# Patient Record
Sex: Female | Born: 1960 | Race: Black or African American | Hispanic: No | Smoking: Current every day smoker
Health system: Southern US, Community
[De-identification: ages and names within clinical notes are randomized; demographics above are authoritative.]

## PROBLEM LIST (undated history)

## (undated) DIAGNOSIS — F329 Major depressive disorder, single episode, unspecified: Secondary | ICD-10-CM

## (undated) DIAGNOSIS — K7010 Alcoholic hepatitis without ascites: Secondary | ICD-10-CM

## (undated) DIAGNOSIS — G8929 Other chronic pain: Secondary | ICD-10-CM

## (undated) DIAGNOSIS — C090 Malignant neoplasm of tonsillar fossa: Secondary | ICD-10-CM

## (undated) DIAGNOSIS — D649 Anemia, unspecified: Secondary | ICD-10-CM

## (undated) DIAGNOSIS — R519 Headache, unspecified: Secondary | ICD-10-CM

## (undated) DIAGNOSIS — J209 Acute bronchitis, unspecified: Secondary | ICD-10-CM

## (undated) DIAGNOSIS — K219 Gastro-esophageal reflux disease without esophagitis: Secondary | ICD-10-CM

## (undated) DIAGNOSIS — J439 Emphysema, unspecified: Secondary | ICD-10-CM

## (undated) DIAGNOSIS — I10 Essential (primary) hypertension: Secondary | ICD-10-CM

## (undated) DIAGNOSIS — J449 Chronic obstructive pulmonary disease, unspecified: Secondary | ICD-10-CM

## (undated) DIAGNOSIS — J189 Pneumonia, unspecified organism: Secondary | ICD-10-CM

## (undated) DIAGNOSIS — J42 Unspecified chronic bronchitis: Secondary | ICD-10-CM

## (undated) DIAGNOSIS — F209 Schizophrenia, unspecified: Secondary | ICD-10-CM

## (undated) DIAGNOSIS — Z720 Tobacco use: Secondary | ICD-10-CM

## (undated) DIAGNOSIS — I499 Cardiac arrhythmia, unspecified: Secondary | ICD-10-CM

## (undated) DIAGNOSIS — F1010 Alcohol abuse, uncomplicated: Secondary | ICD-10-CM

## (undated) DIAGNOSIS — F419 Anxiety disorder, unspecified: Secondary | ICD-10-CM

## (undated) DIAGNOSIS — K5900 Constipation, unspecified: Secondary | ICD-10-CM

## (undated) DIAGNOSIS — F32A Depression, unspecified: Secondary | ICD-10-CM

## (undated) DIAGNOSIS — R51 Headache: Secondary | ICD-10-CM

## (undated) DIAGNOSIS — Z8541 Personal history of malignant neoplasm of cervix uteri: Secondary | ICD-10-CM

## (undated) DIAGNOSIS — M419 Scoliosis, unspecified: Secondary | ICD-10-CM

## (undated) DIAGNOSIS — C801 Malignant (primary) neoplasm, unspecified: Secondary | ICD-10-CM

## (undated) DIAGNOSIS — R0989 Other specified symptoms and signs involving the circulatory and respiratory systems: Secondary | ICD-10-CM

## (undated) DIAGNOSIS — F1410 Cocaine abuse, uncomplicated: Secondary | ICD-10-CM

## (undated) DIAGNOSIS — M549 Dorsalgia, unspecified: Secondary | ICD-10-CM

## (undated) DIAGNOSIS — J45909 Unspecified asthma, uncomplicated: Secondary | ICD-10-CM

## (undated) DIAGNOSIS — R440 Auditory hallucinations: Secondary | ICD-10-CM

## (undated) HISTORY — DX: Malignant neoplasm of tonsillar fossa: C09.0

## (undated) HISTORY — PX: TONSILLECTOMY: SUR1361

## (undated) HISTORY — PX: ABDOMINAL HYSTERECTOMY: SHX81

## (undated) HISTORY — PX: FRACTURE SURGERY: SHX138

## (undated) HISTORY — DX: Personal history of malignant neoplasm of cervix uteri: Z85.41

## (undated) HISTORY — DX: Malignant (primary) neoplasm, unspecified: C80.1

---

## 2000-09-03 ENCOUNTER — Ambulatory Visit (HOSPITAL_COMMUNITY): Admission: RE | Admit: 2000-09-03 | Discharge: 2000-09-03 | Payer: Self-pay | Admitting: Family Medicine

## 2000-09-03 ENCOUNTER — Encounter: Payer: Self-pay | Admitting: Family Medicine

## 2000-09-22 ENCOUNTER — Emergency Department (HOSPITAL_COMMUNITY): Admission: EM | Admit: 2000-09-22 | Discharge: 2000-09-22 | Payer: Self-pay | Admitting: *Deleted

## 2000-11-23 ENCOUNTER — Emergency Department (HOSPITAL_COMMUNITY): Admission: EM | Admit: 2000-11-23 | Discharge: 2000-11-23 | Payer: Self-pay | Admitting: *Deleted

## 2001-01-23 ENCOUNTER — Emergency Department (HOSPITAL_COMMUNITY): Admission: EM | Admit: 2001-01-23 | Discharge: 2001-01-24 | Payer: Self-pay | Admitting: Emergency Medicine

## 2001-01-24 ENCOUNTER — Encounter: Payer: Self-pay | Admitting: Emergency Medicine

## 2001-01-26 ENCOUNTER — Emergency Department (HOSPITAL_COMMUNITY): Admission: EM | Admit: 2001-01-26 | Discharge: 2001-01-26 | Payer: Self-pay | Admitting: Emergency Medicine

## 2001-06-09 ENCOUNTER — Encounter: Payer: Self-pay | Admitting: *Deleted

## 2001-06-09 ENCOUNTER — Emergency Department (HOSPITAL_COMMUNITY): Admission: EM | Admit: 2001-06-09 | Discharge: 2001-06-09 | Payer: Self-pay | Admitting: *Deleted

## 2001-06-16 ENCOUNTER — Emergency Department (HOSPITAL_COMMUNITY): Admission: EM | Admit: 2001-06-16 | Discharge: 2001-06-16 | Payer: Self-pay | Admitting: *Deleted

## 2002-05-07 HISTORY — PX: COLONOSCOPY: SHX174

## 2002-06-02 HISTORY — PX: ESOPHAGOGASTRODUODENOSCOPY: SHX1529

## 2003-05-09 HISTORY — PX: BREAST BIOPSY: SHX20

## 2005-04-30 ENCOUNTER — Emergency Department (HOSPITAL_COMMUNITY): Admission: EM | Admit: 2005-04-30 | Discharge: 2005-04-30 | Payer: Self-pay | Admitting: *Deleted

## 2005-05-21 ENCOUNTER — Emergency Department (HOSPITAL_COMMUNITY): Admission: EM | Admit: 2005-05-21 | Discharge: 2005-05-21 | Payer: Self-pay | Admitting: Emergency Medicine

## 2005-09-04 ENCOUNTER — Ambulatory Visit (HOSPITAL_COMMUNITY): Admission: RE | Admit: 2005-09-04 | Discharge: 2005-09-04 | Payer: Self-pay | Admitting: Family Medicine

## 2005-10-18 ENCOUNTER — Ambulatory Visit (HOSPITAL_COMMUNITY): Admission: RE | Admit: 2005-10-18 | Discharge: 2005-10-18 | Payer: Self-pay | Admitting: Family Medicine

## 2006-04-25 ENCOUNTER — Ambulatory Visit (HOSPITAL_COMMUNITY): Admission: RE | Admit: 2006-04-25 | Discharge: 2006-04-25 | Payer: Self-pay | Admitting: Family Medicine

## 2006-10-18 ENCOUNTER — Emergency Department (HOSPITAL_COMMUNITY): Admission: EM | Admit: 2006-10-18 | Discharge: 2006-10-18 | Payer: Self-pay | Admitting: Emergency Medicine

## 2006-10-31 ENCOUNTER — Ambulatory Visit (HOSPITAL_COMMUNITY): Admission: RE | Admit: 2006-10-31 | Discharge: 2006-10-31 | Payer: Self-pay | Admitting: Internal Medicine

## 2006-11-14 ENCOUNTER — Encounter (INDEPENDENT_AMBULATORY_CARE_PROVIDER_SITE_OTHER): Payer: Self-pay | Admitting: General Surgery

## 2006-11-14 ENCOUNTER — Ambulatory Visit (HOSPITAL_COMMUNITY): Admission: RE | Admit: 2006-11-14 | Discharge: 2006-11-14 | Payer: Self-pay | Admitting: General Surgery

## 2007-11-23 ENCOUNTER — Inpatient Hospital Stay (HOSPITAL_COMMUNITY): Admission: EM | Admit: 2007-11-23 | Discharge: 2007-11-27 | Payer: Self-pay | Admitting: Emergency Medicine

## 2008-01-09 ENCOUNTER — Emergency Department (HOSPITAL_COMMUNITY): Admission: EM | Admit: 2008-01-09 | Discharge: 2008-01-09 | Payer: Self-pay | Admitting: Emergency Medicine

## 2008-02-23 ENCOUNTER — Emergency Department (HOSPITAL_COMMUNITY): Admission: EM | Admit: 2008-02-23 | Discharge: 2008-02-23 | Payer: Self-pay | Admitting: Emergency Medicine

## 2008-03-09 ENCOUNTER — Emergency Department (HOSPITAL_COMMUNITY): Admission: EM | Admit: 2008-03-09 | Discharge: 2008-03-10 | Payer: Self-pay | Admitting: Emergency Medicine

## 2008-03-19 ENCOUNTER — Emergency Department (HOSPITAL_COMMUNITY): Admission: EM | Admit: 2008-03-19 | Discharge: 2008-03-19 | Payer: Self-pay | Admitting: Emergency Medicine

## 2008-11-24 ENCOUNTER — Emergency Department (HOSPITAL_COMMUNITY): Admission: EM | Admit: 2008-11-24 | Discharge: 2008-11-24 | Payer: Self-pay | Admitting: Emergency Medicine

## 2010-04-13 ENCOUNTER — Ambulatory Visit (HOSPITAL_COMMUNITY): Admission: RE | Admit: 2010-04-13 | Payer: Self-pay | Admitting: Internal Medicine

## 2010-05-08 DIAGNOSIS — I499 Cardiac arrhythmia, unspecified: Secondary | ICD-10-CM

## 2010-05-08 HISTORY — DX: Cardiac arrhythmia, unspecified: I49.9

## 2010-05-29 ENCOUNTER — Encounter: Payer: Self-pay | Admitting: Family Medicine

## 2010-05-29 ENCOUNTER — Encounter: Payer: Self-pay | Admitting: General Surgery

## 2010-05-29 ENCOUNTER — Encounter: Payer: Self-pay | Admitting: Internal Medicine

## 2010-08-05 ENCOUNTER — Emergency Department (HOSPITAL_COMMUNITY): Payer: Medicaid Other

## 2010-08-05 ENCOUNTER — Emergency Department (HOSPITAL_COMMUNITY)
Admission: EM | Admit: 2010-08-05 | Discharge: 2010-08-05 | Disposition: A | Discharge status: Home / Self Care | Payer: Medicaid Other | Attending: Emergency Medicine | Admitting: Emergency Medicine

## 2010-08-05 DIAGNOSIS — S8990XA Unspecified injury of unspecified lower leg, initial encounter: Secondary | ICD-10-CM | POA: Insufficient documentation

## 2010-08-05 DIAGNOSIS — S298XXA Other specified injuries of thorax, initial encounter: Secondary | ICD-10-CM | POA: Insufficient documentation

## 2010-08-05 DIAGNOSIS — IMO0002 Reserved for concepts with insufficient information to code with codable children: Secondary | ICD-10-CM | POA: Insufficient documentation

## 2010-08-05 LAB — URINALYSIS, ROUTINE W REFLEX MICROSCOPIC
Bilirubin Urine: NEGATIVE
Ketones, ur: NEGATIVE mg/dL
Nitrite: NEGATIVE
Protein, ur: NEGATIVE mg/dL
Specific Gravity, Urine: <1.005 — ABNORMAL LOW (ref 1.005–1.030)
Urobilinogen, UA: 0.2 mg/dL (ref 0.0–1.0)

## 2010-08-05 LAB — CBC
HCT: 35.7 % — ABNORMAL LOW (ref 36.0–46.0)
MCH: 34 pg (ref 26.0–34.0)
MCV: 99.4 fL (ref 78.0–100.0)
Platelets: 549 10*3/uL — ABNORMAL HIGH (ref 150–400)
RDW: 13.8 % (ref 11.5–15.5)

## 2010-08-05 LAB — BASIC METABOLIC PANEL
BUN: 16 mg/dL (ref 6–23)
CO2: 24 mEq/L (ref 19–32)
Chloride: 108 mEq/L (ref 96–112)
Glucose, Bld: 97 mg/dL (ref 70–99)
Potassium: 4.1 mEq/L (ref 3.5–5.1)

## 2010-08-05 LAB — DIFFERENTIAL
Eosinophils Absolute: 0.1 10*3/uL (ref 0.0–0.7)
Eosinophils Relative: 1 % (ref 0–5)
Lymphs Abs: 2.9 10*3/uL (ref 0.7–4.0)
Monocytes Relative: 9 % (ref 3–12)

## 2010-08-05 LAB — POCT PREGNANCY, URINE: Preg Test, Ur: NEGATIVE

## 2010-08-29 ENCOUNTER — Other Ambulatory Visit (HOSPITAL_COMMUNITY): Payer: Self-pay | Admitting: Internal Medicine

## 2010-08-29 DIAGNOSIS — Z139 Encounter for screening, unspecified: Secondary | ICD-10-CM

## 2010-08-30 ENCOUNTER — Ambulatory Visit (HOSPITAL_COMMUNITY)
Admission: RE | Admit: 2010-08-30 | Discharge: 2010-08-30 | Disposition: A | Discharge status: Home / Self Care | Payer: Medicaid Other | Source: Ambulatory Visit | Attending: Internal Medicine | Admitting: Internal Medicine

## 2010-08-30 DIAGNOSIS — Z139 Encounter for screening, unspecified: Secondary | ICD-10-CM

## 2010-08-30 DIAGNOSIS — Z1231 Encounter for screening mammogram for malignant neoplasm of breast: Secondary | ICD-10-CM | POA: Insufficient documentation

## 2010-09-08 ENCOUNTER — Encounter (HOSPITAL_COMMUNITY): Payer: Self-pay | Admitting: Radiology

## 2010-09-08 ENCOUNTER — Emergency Department (HOSPITAL_COMMUNITY): Payer: Medicaid Other

## 2010-09-08 ENCOUNTER — Emergency Department (HOSPITAL_COMMUNITY)
Admission: EM | Admit: 2010-09-08 | Discharge: 2010-09-08 | Disposition: A | Discharge status: Home / Self Care | Payer: Medicaid Other | Attending: Emergency Medicine | Admitting: Emergency Medicine

## 2010-09-08 DIAGNOSIS — F79 Unspecified intellectual disabilities: Secondary | ICD-10-CM | POA: Insufficient documentation

## 2010-09-08 DIAGNOSIS — Z8659 Personal history of other mental and behavioral disorders: Secondary | ICD-10-CM | POA: Insufficient documentation

## 2010-09-08 DIAGNOSIS — Z8541 Personal history of malignant neoplasm of cervix uteri: Secondary | ICD-10-CM | POA: Insufficient documentation

## 2010-09-08 DIAGNOSIS — F1410 Cocaine abuse, uncomplicated: Secondary | ICD-10-CM | POA: Insufficient documentation

## 2010-09-08 DIAGNOSIS — I10 Essential (primary) hypertension: Secondary | ICD-10-CM | POA: Insufficient documentation

## 2010-09-08 DIAGNOSIS — R071 Chest pain on breathing: Secondary | ICD-10-CM | POA: Insufficient documentation

## 2010-09-08 DIAGNOSIS — J438 Other emphysema: Secondary | ICD-10-CM | POA: Insufficient documentation

## 2010-09-08 DIAGNOSIS — Z7982 Long term (current) use of aspirin: Secondary | ICD-10-CM | POA: Insufficient documentation

## 2010-09-08 DIAGNOSIS — F1010 Alcohol abuse, uncomplicated: Secondary | ICD-10-CM | POA: Insufficient documentation

## 2010-09-08 DIAGNOSIS — Z79899 Other long term (current) drug therapy: Secondary | ICD-10-CM | POA: Insufficient documentation

## 2010-09-08 HISTORY — DX: Essential (primary) hypertension: I10

## 2010-09-08 LAB — DIFFERENTIAL
Eosinophils Relative: 1 % (ref 0–5)
Lymphocytes Relative: 58 % — ABNORMAL HIGH (ref 12–46)
Lymphs Abs: 4.5 10*3/uL — ABNORMAL HIGH (ref 0.7–4.0)
Monocytes Absolute: 0.4 10*3/uL (ref 0.1–1.0)
Neutro Abs: 2.7 10*3/uL (ref 1.7–7.7)

## 2010-09-08 LAB — POCT CARDIAC MARKERS
Myoglobin, poc: 22 ng/mL (ref 12–200)
Troponin i, poc: <0.05 ng/mL (ref 0.00–0.09)
Troponin i, poc: <0.05 ng/mL (ref 0.00–0.09)

## 2010-09-08 LAB — COMPREHENSIVE METABOLIC PANEL
ALT: 22 U/L (ref 0–35)
Alkaline Phosphatase: 97 U/L (ref 39–117)
BUN: 13 mg/dL (ref 6–23)
CO2: 24 mEq/L (ref 19–32)
GFR calc non Af Amer: >60 mL/min (ref >60)
Glucose, Bld: 82 mg/dL (ref 70–99)
Potassium: 3.6 mEq/L (ref 3.5–5.1)
Sodium: 139 mEq/L (ref 135–145)
Total Bilirubin: 0.2 mg/dL — ABNORMAL LOW (ref 0.3–1.2)
Total Protein: 8 g/dL (ref 6.0–8.3)

## 2010-09-08 LAB — ETHANOL: Alcohol, Ethyl (B): 283 mg/dL — ABNORMAL HIGH (ref 0–10)

## 2010-09-08 LAB — CBC
HCT: 36.6 % (ref 36.0–46.0)
Hemoglobin: 12.8 g/dL (ref 12.0–15.0)
MCV: 98.4 fL (ref 78.0–100.0)
RDW: 13 % (ref 11.5–15.5)
WBC: 7.8 10*3/uL (ref 4.0–10.5)

## 2010-09-08 LAB — PROTIME-INR
INR: 0.94 (ref 0.00–1.49)
Prothrombin Time: 12.8 seconds (ref 11.6–15.2)

## 2010-09-08 LAB — RAPID URINE DRUG SCREEN, HOSP PERFORMED
Cocaine: POSITIVE — AB
Tetrahydrocannabinol: NOT DETECTED

## 2010-09-08 LAB — APTT: aPTT: 34 seconds (ref 24–37)

## 2010-09-20 NOTE — Discharge Summary (Signed)
NAMEMCKINLEIGH, SCHUCHART NO.:  [id number]   MEDICAL RECORD NO.:  [id number]          PATIENT TYPE:  INP   LOCATION:  A323                          FACILITY:  [facility]   PHYSICIAN:  Dorris Singh, DO    DATE OF BIRTH:  [birth year]   DATE OF ADMISSION:  11/23/2007  DATE OF DISCHARGE:  07/22/2009LH                               DISCHARGE SUMMARY   ADMISSION DIAGNOSES:  1. Acute alcohol intoxication.  2. Bilateral pneumonia by aspiration.  3. Abdominal pain, unclear etiology.  4. History of hypertension.   DISCHARGE DIAGNOSES:  1. Bilateral aspiration pneumonia which is improving clinically.  2. History of hypertension.  3. History of alcohol abuse.   CONSULTS:  There were not any.   TESTING THAT WAS DONE:  Include on November 23, 2007, she had a pneumonia  chest x-ray done which demonstrated left lower lobe airspace disease.  On the 18th she had a 1-veiw chest which showed bibasilar atelectasis  pneumonia.  On the 18th also she had a CT of the head without a contrast  which demonstrated no acute intracranial findings by noncontrast CT.  On  the 18th also she had a 2-view abdomen which showed nonobstructive bowel  gas pattern.   PRIMARY CARE PHYSICIAN:  Dr. Olena Leatherwood.   Her HP was done by Dr. Rito Ehrlich.  To summaries, patient is a 50 year old  [place] female who was found lying on the ground outside of her  apartment building.  EMS was called and she was brought in.  She was  found to be intoxicated with alcohol and also hypothermic.  She was also  found to have bilateral pneumonia.  She was treated with Rocephin and  Zithromax and also placed on Ativan protocol and thiamine and counseling  was provided.  She was found not to have pancreatitis and continued to  improve well without any problems.  There was still some issue with her  hypertension.  She is on hydrochlorothiazide and bisoprolol at home.  She was started on Benicar while she was here today as well.   She was  placed on DVT and GI prophylaxis.  She continued to improve.  She is  pulse oxing well.  It was determined that she could be discharged to  home today.   PHYSICAL EXAMINATION:  Patient is generally is well-developed, well-  nourished, no acute distress, alert and oriented x3.  HEART:  Regular rate and rhythm.  LUNGS:  Clear to auscultation.  No wheezing, rales or rhonchi.  ABDOMEN:  Soft, nontender, nondistended.  EXTREMITIES:  Positive pulses.   Her labs reviewed today are within normal limits.   MEDICATIONS:  Medications that she will be sent home on include:  1. A Z-Pak use as directed 1 dispensed.  2. Levaquin 500 mg 1 p.o. every day times 5 days.  3. Also was sent home on Benicar 20 mg p.o. daily #30.  4. Ativan 1 mg 1 p.o. q.12 hours p.r.n. #4.   DISCHARGE INSTRUCTIONS:  She will go home on her current medications  which includes:  1. Evista 60 mg 1 p.o. every day.  2. Bisoprolol/hydrochlorothiazide 30 mg 1 tab p.o. every day.  3. ProAir 2 puffs p.r.n.  4. Hydrocodone APA no dose given 1 tablet every 12 hours.  5. Omeprazole 20 mg 1 p.o. daily.  6. Zyprexa 20 mg 1 p.o. every day.  We will actually stop her hydrochlorothiazide and continue with  her__________mg p.o. every day and Oystercal 500 mg p.o. daily.  Placed  her on a Z-Pak use as directed, Levaquin 500 mg 1 p.o. daily x5 days and  Ativan 1 p.o. q.12 hours p.r.n. x4.  She is recommended to follow up  with Dr. Olena Leatherwood in 1-3 days and to stop drinking alcohol.  Explained  extensively with patient the risk versus benefits of her actions.  She  states that she would like to stop smoking but of course not at this  time and to stop drinking alcohol.  She understand the potential risk  that it contributes to her health.   CONDITION:  Today is stable.   DISCHARGE:  to home.      Dorris Singh, DO  Electronically Signed     CB/MEDQ  D:  11/27/2007  T:  11/27/2007  Job:  [id number]   cc:   Lia Hopping   Fax: [phone]

## 2010-09-20 NOTE — Op Note (Signed)
[name], Molly Campbell NO.:  [id number]   MEDICAL RECORD NO.:  [id number]          PATIENT TYPE:  AMB   LOCATION:  DAY                           FACILITY:  [facility]   PHYSICIAN:  Dalia Heading, M.D.  DATE OF BIRTH:  08/07/1960   DATE OF PROCEDURE:  11/14/2006  DATE OF DISCHARGE:                               OPERATIVE REPORT   PREOPERATIVE DIAGNOSIS:  Left breast neoplasm, unspecified.   POSTOPERATIVE DIAGNOSIS:  Left breast neoplasm, unspecified.   PROCEDURE:  Left breast biopsy after needle localization.   SURGEON:  Dalia Heading, MD   ANESTHESIA:  General.   INDICATIONS:  The patient is a 50 year old black female who presents  with a very posterior left breast neoplasm which could not  stereotactically biopsied.  The patient now comes to the operating room  for left breast biopsy after needle localization.  The risks and  benefits of the procedure including bleeding and infection were fully  explained to the patient, who gave informed consent.   PROCEDURE NOTE:  The patient is placed in supine position after  undergoing needle localization in the radiology department.  The left  breast was prepped and draped using the usual sterile technique after  Betadine once general anesthesia was administered.  Surgical site  confirmation was performed.   The needle localization was in the upper, outer quadrant, along the  inferior aspect of the axilla.  An incision was made in the upper, outer  quadrant of the left breast down to the area of localization.  This was  removed without difficulty.  Of note was the fact that this was  extending to the pectoralis major muscle and into the axilla to some  degree.  The specimen was then sent for specimen radiography.  The  microcalcifications were within the specimen removed.  The specimen was  then set sent to pathology for further examination.  Any bleeding was  controlled using Bovie electrocautery.  The subcutaneous  layer was  reapproximated using a 4-0 Vicryl interrupted suture.  The skin was  closed using a 4-0 Vicryl subcuticular suture.  Sensorcaine 0.5% was  instilled into the surrounding wound.  Dermabond was then applied.   All tape and needle counts were correct at the end of the procedure.  The patient was awakened and transferred to PACU in stable condition.   COMPLICATIONS:  None.   SPECIMEN:  Left breast tissue.   BLOOD LOSS:  Minimal.      Dalia Heading, M.D.  Electronically Signed     MAJ/MEDQ  D:  11/14/2006  T:  11/15/2006  Job:  [id number]   cc:   Lia Hopping  Fax: [phone]

## 2010-09-20 NOTE — Group Therapy Note (Signed)
NAMELORE, POLKA NO.:  [id number]   MEDICAL RECORD NO.:  [id number]          PATIENT TYPE:  INP   LOCATION:  A323                          FACILITY:  [facility]   PHYSICIAN:  Osvaldo Shipper, MD     DATE OF BIRTH:  01-09-1961   DATE OF PROCEDURE:  11/26/2007  DATE OF DISCHARGE:                                 PROGRESS NOTE   SUBJECTIVE:  Briefly, this is a 50 year old African American female who  presented to the hospital with unresponsiveness.  She was found to be  acutely alcohol intoxicated.  She was also a little bit short of breath  and was found to have pneumonia.   Subjectively, the patient is feeling better today.  She was having some  shakes this morning, otherwise she does not have any nausea, vomiting or  diarrhea.  She is tolerating p.o. intake.   OBJECTIVE:  VITAL SIGNS:  Temperature 97.4, blood pressure 148/100,  heart rate 92, respiratory rate 20, saturation 95% on room air.  LUNGS:  Clear to auscultation bilaterally today.  No rales, rhonchi or  wheezing.  CARDIOVASCULAR:  S1 and S2 are normal and regular.  No murmurs  appreciated.  ABDOMEN:  Soft.  EXTREMITIES:  Do not show any edema.   LABORATORY DATA:  Stable findings.   ASSESSMENT AND PLANS:  1. Aspiration pneumonia.  Continue intravenous antibiotics for now.      This is day #4.  2. Alcoholism with withdrawal symptoms.  She is on Ativan protocol and      thiamine.  She is also on Clonidine for three days to help with her      blood pressure.  She seems to be doing better.  She is still a      little bit tremulous.  I am  hoping that she will improve in the      next day or two and she will be ready for discharge soon.  3. She has a history of hypertension.  She was on hydrochlorothiazide      at home.  She was actually on a combination drug which included the      hydrochlorothiazide but she does not know what the other part was.      I am going to go ahead and start this patient on  some Benicar      today.  The Clonidine will automatically stop after tomorrow's last      dose.  If needed, it can be continued by further orders.  4. She is on deep venous thrombosis prophylaxis  Her electrolytes are      all okay.  She is tolerating p.o. intake.  I anticipate discharge      in the next day or two.      Osvaldo Shipper, MD  Electronically Signed     GK/MEDQ  D:  11/26/2007  T:  11/26/2007  Job:  [id number]

## 2010-09-20 NOTE — H&P (Signed)
Molly Campbell, Molly Campbell NO.:  [id number]   MEDICAL RECORD NO.:  [id number]          PATIENT TYPE:  INP   LOCATION:  A323                          FACILITY:  [facility]   PHYSICIAN:  Osvaldo Shipper, MD     DATE OF BIRTH:  [birth year]   DATE OF ADMISSION:  11/23/2007  DATE OF DISCHARGE:  LH                              HISTORY & PHYSICAL   PRIMARY CARE PHYSICIAN:  Lia Hopping, M.D.   ADMISSION DIAGNOSES:  1. Acute alcohol intoxication.  2. Bilateral pneumonia by aspiration.  3. Abnormal pain, unclear etiology.  4. History of hypertension.   CHIEF COMPLAINT:  Abdominal pain.   HISTORY OF PRESENT ILLNESS:  This is a 50 year old African American  female who was found lying on the ground in a parking lot outside of an  apartment building.  EMS was called and the patient was brought in to  the ED.  She was found to be intoxicated with alcohol.  The patient was  also hypothermic.  The patient slowly started to wake up and then she  mentioned that she had been binge drinking.  The patient at this time  was complaining of a cough with brownish expectoration.  Denies any  chest pain.  Does have some shortness of breath.  Also complaining of  nausea and vomiting as well as diarrhea for the past two or three days.  She also has mentioned a wave of abnormal discomfort towards her  abdomen.  No radiation of the pain is present.  No precipitant  aggravating or relieving factors identified.  Pain is about 4 out of 10  in intensity.  The patient currently has slurred speech, is still  slightly intoxicated, and no further history is available.   MEDICATIONS:  1. She is on an antihypertensive agent.  She says HCTZ in combination      with another drug.  2. She is on a sleeping pill.  3. She says she is on a medication called Evista, dose is unknown.  4. She is also on  what she calls a nerve pill, possibly Zyprexa,      though she is not very sure.   ALLERGIES:  No known drug  allergies.   PAST MEDICAL HISTORY:  1. Hypertension.  2. Alcoholism.   PAST SURGICAL HISTORY:  1. Hysterectomy.  2. Some sort of breast surgery.  Apparently she had a left breast      biopsy done last year.  This was positive for a cystic lesion and      no definite malignancy was identified.  3. She has had an EGD and a colonoscopy for unclear reasons in the      past according to the patient.  No report is available in our      system here.  In fact, according to her, she was found to have      polyps.   SOCIAL HISTORY:  Lives alone in an apartment.  Smokes a pack and a half  of cigarettes on a daily basis.  Alcohol consumption on a daily basis  with beer.  Denies any  liquor use.  She tells me that her birthday is  next month and she plans to quit drinking alcohol at that time.  Admits  to cocaine use.  No marijuana use.  No IV drug use.  She is independent  with her daily activities.   FAMILY HISTORY:  Positive for Alzheimer's dementia, hypertension,  diabetes.   REVIEW OF SYSTEMS:  GENERAL:  Positive for weakness.  HEENT:  Positive  for headaches.  CARDIOVASCULAR:  Unremarkable.  RESPIRATORY:  As in HPI.  GI:  As in HPI.  GU:  She said that she had a sexual encounter about a  week ago and then she has had two small episodes of vaginal bleeding but  she does not have any currently.  MUSCULOSKELETAL:  Unremarkable.  NEUROLOGIC:  Unremarkable. PSYCHIATRIC:  Unremarkable.  Other systems  unremarkable.   PHYSICAL EXAMINATION:  VITAL SIGNS:  In the ED she was found to be  hypothermic. Temperature was 95.2 rectally, blood pressure 90/67,  improving to 130/67 with IV fluids.  Heart rate in the 60'sponge and  regular.  Respiratory rate 16.  Saturation 96% on room air.  GENERAL:  This is an [place] American female in no distress, still  intoxicated.  HEENT:  There is no pallor noted. Pupils equal, reacting.  ENT exam was  normal.  NECK:  Soft and supple.  No thyromegaly is  appreciated.  LUNGS:  A few crackles bilateral bases. No wheezing is appreciated.  CARDIOVASCULAR:  S1 and S2  normal, regular.  No murmurs are appreciated  at this time.  No S3 or S4. No rubs, no bruits.  ABDOMEN:  Soft.  Generalized vague discomfort.  No real tenderness is  identified.  No hepatomegaly or splenomegaly.  No masses identified.  Bowel sounds present.  EXTREMITIES:  No edema.  Pulses are palpable.  NEUROLOGIC:  Cranial nerves II-XII intact.  Sensory exam intact.  Motor  strength is intact. She is alert, oriented x3, though she does appear to  be intoxicated with slurred speech.   LABORATORY DATA:  She had an ABG done pH 7.34, pCO2 38, pO2 78,  saturation 93%. Her CBC showed normal white count, hemoglobin 11.2, MCV  99.  Platelet count 362,000, 58% lymphocytes identified.  Lymphocytes on  peripheral smear is noted.  Chloride 115.  Other parameters are normal  on the CMET.  Cardiac markers negative.  Acetaminophen level less than  10.  Cocaine positive on urine drug screen.  Alcohol level 275.  Urinalysis was negative for infection.  She had a CT scan of the head.  She did not show any acute intracranial abnormalities.  Chest x-ray  shows evidence for bilateral atelectasis versus pneumonia.  She also had an EKG which showed sinus rhythm with a normal axis;  intervals appear to be in the normal range.  P wave was quite prominent,  nonspecific T wave changes are present.  No acute ST changes are  present.   ASSESSMENT:  This is a 50 year old [place] American female with history  of hypertension who presents after being found to be unresponsive  outside her apartment.  She is found to be acutely intoxicated with  alcohol.  Also took cocaine and has a pneumonia, likely aspiration.   PLAN:  1. Bilateral pneumonia.  Will treat with ceftriaxone and Zithromax for      now.  Will monitor her closely.  2. Alcohol abuse.  We will put her on Ativan protocol,  given her a  banana bag, start thiamine.  Counseling has been provided.      Magnesium level will be checked as well.  3. Abdominal pain.  Etiology is unclear.  Will check lipase level to      make sure there is no pancreatitis, considering her alcohol abuse      history.  Will also check abdominal plain film.  4. History of vaginal bleeding.  Encouraged her to speak to her      gynecologist regarding this matter.  This is not an active issue at      this time.   We will try to obtain her medication list from [facility] so  that we have the up-to-date dosages.   DVT and GI prophylaxis has been issued.   A Bear Hugger for hypothermia will be utilized until her temperature  comes up.   Estimated length of stay in the hospital is 3 days.      Osvaldo Shipper, MD  Electronically Signed     GK/MEDQ  D:  11/23/2007  T:  11/23/2007  Job:  [id number]   cc:   Lia Hopping  Fax: [phone]

## 2010-09-20 NOTE — H&P (Signed)
NAMETANEAL, SONNTAG NO.:  [id number]   MEDICAL RECORD NO.:  [id number]          PATIENT TYPE:  AMB   LOCATION:  DAY                           FACILITY:  [facility]   PHYSICIAN:  Dalia Heading, M.D.  DATE OF BIRTH:  [birth year]   DATE OF ADMISSION:  11/14/2006  DATE OF DISCHARGE:  LH                              HISTORY & PHYSICAL   CHIEF COMPLAINT:  Left breast neoplasm.   HISTORY OF PRESENT ILLNESS:  The patient is a 50 year old black female  who was referred for evaluation and treatment of an abnormal mammogram  of the left breast.  This was found on routine mammography.  It is too  posterior for a stereotactic biopsy.  No family history of breast  carcinoma or nipple discharge is noted.  No lump is noted by the  patient.   PAST MEDICAL HISTORY:  1. COPD.  2. Hypertension.   PAST SURGICAL HISTORY:  Hysterectomy.   CURRENT MEDICATIONS:  Hydrochlorothiazide, albuterol, Zyprexa.   ALLERGIES:  No known drug allergies.   REVIEW OF SYSTEMS:  The patient smokes a pack and a half of cigarettes a  day.  She does drink alcohol daily.  She denies any other  cardiopulmonary difficulties or bleeding disorders.   PHYSICAL EXAMINATION:  The patient is a well-developed, well-nourished  black female in no acute distress.  NECK:  Supple without lymphadenopathy.  LUNGS:  Examination reveals bilateral coarse rhonchi but no wheezing  noted.  HEART:  A regular rate and rhythm without S3, S4 or murmurs.  Right breast examination reveals no dominant mass, nipple discharge, or  dimpling.  The axilla is negative for palpable nodes.  The left breast  examination reveals no abdominal mass, nipple discharge, or dimpling.  The axilla is negative for palpable nodes.   A mammogram of the left breast reveals coarse calcifications in the  left, upper, outer quadrant.   IMPRESSION:  Left breast neoplasm.   PLAN:  The patient is scheduled for left breast biopsy after needle  localization on November 14, 2006.  The risks and benefits of the procedure,  including bleeding and infection, were fully explained to the patient,  who gave informed consent.      Dalia Heading, M.D.  Electronically Signed     MAJ/MEDQ  D:  11/06/2006  T:  11/07/2006  Job:  [id number]   cc:   [facility]  Fax: [phone]   Lia Hopping  Fax: [phone]

## 2010-11-21 ENCOUNTER — Emergency Department (HOSPITAL_COMMUNITY)
Admission: EM | Admit: 2010-11-21 | Discharge: 2010-11-22 | Disposition: A | Discharge status: Home / Self Care | Payer: Medicaid Other | Attending: Emergency Medicine | Admitting: Emergency Medicine

## 2010-11-21 ENCOUNTER — Encounter (HOSPITAL_COMMUNITY): Payer: Self-pay | Admitting: Emergency Medicine

## 2010-11-21 DIAGNOSIS — R0602 Shortness of breath: Secondary | ICD-10-CM | POA: Insufficient documentation

## 2010-11-21 DIAGNOSIS — IMO0001 Reserved for inherently not codable concepts without codable children: Secondary | ICD-10-CM

## 2010-11-21 DIAGNOSIS — F1010 Alcohol abuse, uncomplicated: Secondary | ICD-10-CM

## 2010-11-21 DIAGNOSIS — F17200 Nicotine dependence, unspecified, uncomplicated: Secondary | ICD-10-CM | POA: Insufficient documentation

## 2010-11-21 DIAGNOSIS — I10 Essential (primary) hypertension: Secondary | ICD-10-CM | POA: Insufficient documentation

## 2010-11-21 DIAGNOSIS — F1410 Cocaine abuse, uncomplicated: Secondary | ICD-10-CM

## 2010-11-21 DIAGNOSIS — R51 Headache: Secondary | ICD-10-CM | POA: Insufficient documentation

## 2010-11-21 NOTE — ED Notes (Signed)
Patient awaiting MD assessment.

## 2010-11-21 NOTE — ED Notes (Signed)
Patient presents to ER via EMS with c/o shortness of breath; patient states she has drank a 40oz today.  Patient states she fell today and "past out" and was out for hours.  Patient A&O; skin w/d. Respirations even and unlabored; able to speak in complete sentences without difficulty.

## 2010-11-22 ENCOUNTER — Encounter (HOSPITAL_COMMUNITY): Payer: Self-pay | Admitting: *Deleted

## 2010-11-22 ENCOUNTER — Emergency Department (HOSPITAL_COMMUNITY): Payer: Medicaid Other

## 2010-11-22 LAB — CBC
MCH: 34 pg (ref 26.0–34.0)
Platelets: 540 10*3/uL — ABNORMAL HIGH (ref 150–400)
RBC: 3.97 MIL/uL (ref 3.87–5.11)
WBC: 5.7 10*3/uL (ref 4.0–10.5)

## 2010-11-22 LAB — BASIC METABOLIC PANEL
CO2: 23 mEq/L (ref 19–32)
Calcium: 8.6 mg/dL (ref 8.4–10.5)
Chloride: 107 mEq/L (ref 96–112)
Sodium: 142 mEq/L (ref 135–145)

## 2010-11-22 LAB — ETHANOL: Alcohol, Ethyl (B): 219 mg/dL — ABNORMAL HIGH (ref 0–11)

## 2010-11-22 NOTE — ED Notes (Signed)
Alert, talking, says she  Has low back pain.  Asking for something to drink.

## 2010-11-22 NOTE — ED Notes (Signed)
Says she hurts all over.

## 2010-11-22 NOTE — ED Provider Notes (Signed)
History     Chief Complaint  Patient presents with  . Headache  . Shortness of Breath  Cocaine use Patient is a 50 y.o. female presenting with shortness of breath. The history is provided by the patient (She admits to smoking crack recently.).  Shortness of Breath  The current episode started today. The onset was gradual. The problem has been unchanged. The problem is moderate. The symptoms are relieved by nothing. The symptoms are aggravated by nothing. Associated symptoms include shortness of breath. Pertinent negatives include no chest pain, no chest pressure, no orthopnea, no fever, no sore throat, no stridor and no wheezing. Her past medical history does not include asthma or bronchiolitis. Urine output has been normal. The last void occurred less than 6 hours ago. There were no sick contacts. She has received no recent medical care.    Past Medical History  Diagnosis Date  . Hypertension     History reviewed. No pertinent past surgical history.  History reviewed. No pertinent family history.  History  Substance Use Topics  . Smoking status: Current Everyday Smoker -- 2.0 packs/day    Types: Cigarettes  . Smokeless tobacco: Not on file  . Alcohol Use: Yes    OB History    Grav Para Term Preterm Abortions TAB SAB Ect Mult Living                  Review of Systems  Constitutional: Negative for fever.  HENT: Negative for sore throat.   Respiratory: Positive for shortness of breath. Negative for wheezing and stridor.   Cardiovascular: Negative for chest pain and orthopnea.  Neurological: Positive for headaches.  All other systems reviewed and are negative.    Physical Exam  BP 135/89  Pulse 61  Temp(Src) 98.8 F (37.1 C) (Oral)  Ht 5\' 2"  (1.575 m)  Wt 100 lb (45.36 kg)  BMI 18.29 kg/m2  SpO2 96%  Physical Exam  Constitutional: She is oriented to person, place, and time. She appears well-developed. No distress.       Strong smell of alcohol  HENT:  Head:  Normocephalic and atraumatic.  Right Ear: External ear normal.  Left Ear: External ear normal.  Eyes: Pupils are equal, round, and reactive to light.  Neck: Normal range of motion.  Cardiovascular: Normal rate, regular rhythm and normal heart sounds.   Pulmonary/Chest: Effort normal and breath sounds normal.  Abdominal: Soft.  Musculoskeletal: Normal range of motion.  Neurological: She is alert and oriented to person, place, and time. No cranial nerve deficit. Coordination normal.  Skin: Skin is warm and dry. She is not diaphoretic.  Psychiatric: Her behavior is normal. Thought content normal.    ED Course  Procedures  MDM I doubt any other EMC precluding discharge at this time including, but not necessarily limited to the following:PNE, ACS, SBI, metabolic instability.      Flint Melter, MD 11/22/10 [phone]

## 2010-11-22 NOTE — ED Notes (Signed)
Asking for something to eat.  Given sandwich and diet cola.  Alert, talking .

## 2011-02-03 LAB — DIFFERENTIAL
Basophils Absolute: 0
Basophils Absolute: 0.1
Basophils Relative: 1
Eosinophils Absolute: 0.1
Eosinophils Relative: 2
Eosinophils Relative: 4
Lymphocytes Relative: 37
Monocytes Absolute: 0.5
Monocytes Absolute: 0.5

## 2011-02-03 LAB — BLOOD GAS, ARTERIAL
Bicarbonate: 20.4
O2 Saturation: 93.8
Patient temperature: 37
TCO2: 19
pO2, Arterial: 78.3 — ABNORMAL LOW

## 2011-02-03 LAB — CULTURE, BLOOD (ROUTINE X 2)
Culture: NO GROWTH
Culture: NO GROWTH
Report Status: 7232009

## 2011-02-03 LAB — RAPID URINE DRUG SCREEN, HOSP PERFORMED
Amphetamines: NOT DETECTED
Barbiturates: NOT DETECTED
Opiates: NOT DETECTED

## 2011-02-03 LAB — COMPREHENSIVE METABOLIC PANEL
ALT: 23
AST: 26
Albumin: 3.7
Alkaline Phosphatase: 66
Chloride: 115 — ABNORMAL HIGH
GFR calc Af Amer: >60
Potassium: 3.6
Sodium: 143
Total Bilirubin: 0.5

## 2011-02-03 LAB — BASIC METABOLIC PANEL
BUN: 11
BUN: 5 — ABNORMAL LOW
BUN: 6
BUN: 7
CO2: 22
Chloride: 107
Chloride: 113 — ABNORMAL HIGH
Chloride: 116 — ABNORMAL HIGH
Creatinine, Ser: 0.8
Creatinine, Ser: 0.83
GFR calc non Af Amer: >60
GFR calc non Af Amer: >60
Glucose, Bld: 100 — ABNORMAL HIGH
Glucose, Bld: 103 — ABNORMAL HIGH
Glucose, Bld: 91
Glucose, Bld: 97
Potassium: 3.6
Potassium: 3.8
Potassium: 3.8
Potassium: 4.2

## 2011-02-03 LAB — URINALYSIS, ROUTINE W REFLEX MICROSCOPIC
Bilirubin Urine: NEGATIVE
Hgb urine dipstick: NEGATIVE
Specific Gravity, Urine: <1.005 — ABNORMAL LOW
Urobilinogen, UA: 0.2

## 2011-02-03 LAB — CBC
HCT: 30.5 — ABNORMAL LOW
HCT: 33.3 — ABNORMAL LOW
Hemoglobin: 10.4 — ABNORMAL LOW
Hemoglobin: 11.2 — ABNORMAL LOW
MCV: 98.9
Platelets: 358
RDW: 13.4
RDW: 13.6
WBC: 7

## 2011-02-03 LAB — CK TOTAL AND CKMB (NOT AT ARMC): Relative Index: 1.1

## 2011-02-03 LAB — ETHANOL: Alcohol, Ethyl (B): 275 — ABNORMAL HIGH

## 2011-02-07 LAB — BASIC METABOLIC PANEL
BUN: 5 — ABNORMAL LOW
CO2: 24
Chloride: 108
Creatinine, Ser: 0.97
Glucose, Bld: 106 — ABNORMAL HIGH

## 2011-02-07 LAB — URINALYSIS, ROUTINE W REFLEX MICROSCOPIC
Hgb urine dipstick: NEGATIVE
Protein, ur: NEGATIVE
Urobilinogen, UA: 0.2

## 2011-02-07 LAB — CBC
MCHC: 34.8
MCV: 96.6
Platelets: 673 — ABNORMAL HIGH
RDW: 13.7

## 2011-02-07 LAB — DIFFERENTIAL
Basophils Relative: 1
Eosinophils Absolute: 0.1
Monocytes Relative: 5
Neutrophils Relative %: 49

## 2011-02-08 LAB — BASIC METABOLIC PANEL
BUN: 16
CO2: 17 — ABNORMAL LOW
Chloride: 105
Creatinine, Ser: 1.43 — ABNORMAL HIGH

## 2011-02-08 LAB — RAPID URINE DRUG SCREEN, HOSP PERFORMED
Amphetamines: NOT DETECTED
Barbiturates: NOT DETECTED
Cocaine: POSITIVE — AB
Opiates: NOT DETECTED
Tetrahydrocannabinol: NOT DETECTED

## 2011-02-08 LAB — CBC
MCHC: 34.9
MCV: 98
Platelets: 490 — ABNORMAL HIGH
RDW: 12.7
WBC: 9.8

## 2011-02-08 LAB — ETHANOL: Alcohol, Ethyl (B): 261 — ABNORMAL HIGH

## 2011-02-08 LAB — URINALYSIS, ROUTINE W REFLEX MICROSCOPIC
Bilirubin Urine: NEGATIVE
Protein, ur: NEGATIVE
Urobilinogen, UA: 0.2

## 2011-02-08 LAB — PROTIME-INR
INR: 1.1
Prothrombin Time: 14

## 2011-02-08 LAB — DIFFERENTIAL
Basophils Absolute: 0
Basophils Relative: 0
Eosinophils Absolute: 0
Neutrophils Relative %: 44

## 2011-02-08 LAB — GLUCOSE, CAPILLARY: Glucose-Capillary: 181 — ABNORMAL HIGH

## 2011-02-21 LAB — BASIC METABOLIC PANEL
BUN: 7
Chloride: 108
Glucose, Bld: 81
Potassium: 4.3
Sodium: 144

## 2011-02-21 LAB — CBC
HCT: 39.7
Hemoglobin: 13.8
MCV: 95.7
Platelets: 501 — ABNORMAL HIGH
WBC: 6.3

## 2011-03-14 ENCOUNTER — Other Ambulatory Visit: Payer: Self-pay

## 2011-03-14 ENCOUNTER — Inpatient Hospital Stay (HOSPITAL_COMMUNITY)
Admission: EM | Admit: 2011-03-14 | Discharge: 2011-03-16 | DRG: 392 | Disposition: A | Discharge status: Home / Self Care | Payer: Medicaid Other | Attending: Internal Medicine | Admitting: Internal Medicine

## 2011-03-14 ENCOUNTER — Emergency Department (HOSPITAL_COMMUNITY): Payer: Medicaid Other

## 2011-03-14 ENCOUNTER — Encounter (HOSPITAL_COMMUNITY): Payer: Self-pay | Admitting: Emergency Medicine

## 2011-03-14 DIAGNOSIS — R0789 Other chest pain: Secondary | ICD-10-CM | POA: Diagnosis present

## 2011-03-14 DIAGNOSIS — F17200 Nicotine dependence, unspecified, uncomplicated: Secondary | ICD-10-CM | POA: Diagnosis present

## 2011-03-14 DIAGNOSIS — F411 Generalized anxiety disorder: Secondary | ICD-10-CM | POA: Diagnosis present

## 2011-03-14 DIAGNOSIS — F10929 Alcohol use, unspecified with intoxication, unspecified: Secondary | ICD-10-CM

## 2011-03-14 DIAGNOSIS — K208 Other esophagitis without bleeding: Principal | ICD-10-CM | POA: Diagnosis present

## 2011-03-14 DIAGNOSIS — K7010 Alcoholic hepatitis without ascites: Secondary | ICD-10-CM

## 2011-03-14 DIAGNOSIS — F1320 Sedative, hypnotic or anxiolytic dependence, uncomplicated: Secondary | ICD-10-CM | POA: Diagnosis present

## 2011-03-14 DIAGNOSIS — K219 Gastro-esophageal reflux disease without esophagitis: Secondary | ICD-10-CM | POA: Diagnosis present

## 2011-03-14 DIAGNOSIS — Z720 Tobacco use: Secondary | ICD-10-CM

## 2011-03-14 DIAGNOSIS — R079 Chest pain, unspecified: Secondary | ICD-10-CM | POA: Diagnosis present

## 2011-03-14 DIAGNOSIS — F1010 Alcohol abuse, uncomplicated: Secondary | ICD-10-CM

## 2011-03-14 DIAGNOSIS — I10 Essential (primary) hypertension: Secondary | ICD-10-CM

## 2011-03-14 DIAGNOSIS — F1410 Cocaine abuse, uncomplicated: Secondary | ICD-10-CM

## 2011-03-14 DIAGNOSIS — K2920 Alcoholic gastritis without bleeding: Secondary | ICD-10-CM | POA: Diagnosis present

## 2011-03-14 HISTORY — DX: Anxiety disorder, unspecified: F41.9

## 2011-03-14 HISTORY — DX: Cocaine abuse, uncomplicated: F14.10

## 2011-03-14 HISTORY — DX: Auditory hallucinations: R44.0

## 2011-03-14 HISTORY — DX: Tobacco use: Z72.0

## 2011-03-14 HISTORY — DX: Alcoholic hepatitis without ascites: K70.10

## 2011-03-14 HISTORY — DX: Alcohol abuse, uncomplicated: F10.10

## 2011-03-14 LAB — MAGNESIUM: Magnesium: 1.7 mg/dL (ref 1.5–2.5)

## 2011-03-14 LAB — DIFFERENTIAL
Basophils Absolute: 0.1 10*3/uL (ref 0.0–0.1)
Basophils Relative: 2 % — ABNORMAL HIGH (ref 0–1)
Eosinophils Absolute: 0.1 10*3/uL (ref 0.0–0.7)
Lymphs Abs: 4 10*3/uL (ref 0.7–4.0)
Neutrophils Relative %: 24 % — ABNORMAL LOW (ref 43–77)

## 2011-03-14 LAB — BASIC METABOLIC PANEL
Calcium: 9.1 mg/dL (ref 8.4–10.5)
GFR calc non Af Amer: >90 mL/min (ref >90)
Glucose, Bld: 84 mg/dL (ref 70–99)
Sodium: 142 mEq/L (ref 135–145)

## 2011-03-14 LAB — CBC
MCH: 34.2 pg — ABNORMAL HIGH (ref 26.0–34.0)
Platelets: 455 10*3/uL — ABNORMAL HIGH (ref 150–400)
RBC: 4.04 MIL/uL (ref 3.87–5.11)
RDW: 13.1 % (ref 11.5–15.5)

## 2011-03-14 LAB — HEPATIC FUNCTION PANEL
ALT: 58 U/L — ABNORMAL HIGH (ref 0–35)
Bilirubin, Direct: <0.1 mg/dL (ref 0.0–0.3)
Total Protein: 8.2 g/dL (ref 6.0–8.3)

## 2011-03-14 LAB — POCT I-STAT TROPONIN I: Troponin i, poc: 0 ng/mL (ref 0.00–0.08)

## 2011-03-14 LAB — APTT: aPTT: 36 seconds (ref 24–37)

## 2011-03-14 LAB — RAPID URINE DRUG SCREEN, HOSP PERFORMED
Amphetamines: NOT DETECTED
Barbiturates: NOT DETECTED
Tetrahydrocannabinol: NOT DETECTED

## 2011-03-14 LAB — PHOSPHORUS: Phosphorus: 4.7 mg/dL — ABNORMAL HIGH (ref 2.3–4.6)

## 2011-03-14 LAB — ETHANOL: Alcohol, Ethyl (B): 311 mg/dL — ABNORMAL HIGH (ref 0–11)

## 2011-03-14 LAB — PROTIME-INR: Prothrombin Time: 13 seconds (ref 11.6–15.2)

## 2011-03-14 MED ORDER — THIAMINE HCL 100 MG/ML IJ SOLN
Freq: Once | INTRAVENOUS | Status: AC
  Administered 2011-03-14: 23:00:00 via INTRAVENOUS
  Filled 2011-03-14: qty 1000

## 2011-03-14 MED ORDER — ASPIRIN EC 81 MG PO TBEC
81.0000 mg | DELAYED_RELEASE_TABLET | Freq: Every day | ORAL | Status: DC
  Administered 2011-03-14 – 2011-03-16 (×3): 81 mg via ORAL
  Filled 2011-03-14 (×3): qty 1

## 2011-03-14 MED ORDER — OLANZAPINE 5 MG PO TABS
20.0000 mg | ORAL_TABLET | Freq: Every day | ORAL | Status: DC
  Administered 2011-03-14 – 2011-03-16 (×3): 20 mg via ORAL
  Filled 2011-03-14: qty 4
  Filled 2011-03-14: qty 3
  Filled 2011-03-14: qty 1
  Filled 2011-03-14: qty 4

## 2011-03-14 MED ORDER — NICOTINE 21 MG/24HR TD PT24
21.0000 mg | MEDICATED_PATCH | Freq: Every day | TRANSDERMAL | Status: DC
  Administered 2011-03-14 – 2011-03-16 (×3): 21 mg via TRANSDERMAL
  Filled 2011-03-14 (×5): qty 1

## 2011-03-14 MED ORDER — ONDANSETRON HCL 4 MG PO TABS: 4.0000 mg | ORAL_TABLET | Freq: Four times a day (QID) | ORAL | Status: DC | PRN

## 2011-03-14 MED ORDER — ASPIRIN 81 MG PO CHEW
324.0000 mg | CHEWABLE_TABLET | Freq: Once | ORAL | Status: AC
  Administered 2011-03-14: 324 mg via ORAL
  Filled 2011-03-14: qty 4

## 2011-03-14 MED ORDER — GUAIFENESIN ER 600 MG PO TB12
1200.0000 mg | ORAL_TABLET | Freq: Two times a day (BID) | ORAL | Status: DC
  Administered 2011-03-14 – 2011-03-16 (×4): 1200 mg via ORAL
  Filled 2011-03-14 (×3): qty 2

## 2011-03-14 MED ORDER — THIAMINE HCL 100 MG/ML IJ SOLN: 100.0000 mg | Freq: Every day | INTRAMUSCULAR | Status: DC

## 2011-03-14 MED ORDER — NICOTINE 21 MG/24HR TD PT24
MEDICATED_PATCH | TRANSDERMAL | Status: AC
  Filled 2011-03-14: qty 1

## 2011-03-14 MED ORDER — DOCUSATE SODIUM 100 MG PO CAPS: 100.0000 mg | ORAL_CAPSULE | Freq: Two times a day (BID) | ORAL | Status: DC | PRN

## 2011-03-14 MED ORDER — THERA M PLUS PO TABS
1.0000 | ORAL_TABLET | Freq: Every day | ORAL | Status: DC
  Administered 2011-03-15 – 2011-03-16 (×2): 1 via ORAL
  Filled 2011-03-14 (×3): qty 1

## 2011-03-14 MED ORDER — LORAZEPAM 2 MG/ML IJ SOLN
1.0000 mg | Freq: Once | INTRAMUSCULAR | Status: AC
  Administered 2011-03-14: 2 mg via INTRAVENOUS
  Filled 2011-03-14: qty 1

## 2011-03-14 MED ORDER — TRAZODONE HCL 50 MG PO TABS
25.0000 mg | ORAL_TABLET | Freq: Every evening | ORAL | Status: DC | PRN
  Administered 2011-03-14: 25 mg via ORAL
  Filled 2011-03-14: qty 1

## 2011-03-14 MED ORDER — FOLIC ACID 1 MG PO TABS
1.0000 mg | ORAL_TABLET | Freq: Every day | ORAL | Status: DC
Start: 2011-03-15 — End: 2011-03-15

## 2011-03-14 MED ORDER — HYDROMORPHONE HCL PF 1 MG/ML IJ SOLN: 0.5000 mg | INTRAMUSCULAR | Status: DC | PRN

## 2011-03-14 MED ORDER — ACETAMINOPHEN 325 MG PO TABS: 650.0000 mg | ORAL_TABLET | Freq: Four times a day (QID) | ORAL | Status: DC | PRN

## 2011-03-14 MED ORDER — FOLIC ACID 1 MG PO TABS
1.0000 mg | ORAL_TABLET | Freq: Every day | ORAL | Status: DC
  Administered 2011-03-14 – 2011-03-16 (×3): 1 mg via ORAL
  Filled 2011-03-14 (×3): qty 1

## 2011-03-14 MED ORDER — LORAZEPAM 2 MG/ML IJ SOLN
0.0000 mg | Freq: Two times a day (BID) | INTRAMUSCULAR | Status: DC
Start: 2011-03-16 — End: 2011-03-16

## 2011-03-14 MED ORDER — M.V.I. ADULT IV INJ
INJECTION | INTRAVENOUS | Status: AC
  Filled 2011-03-14: qty 10

## 2011-03-14 MED ORDER — LORAZEPAM 1 MG PO TABS: 1.0000 mg | ORAL_TABLET | Freq: Four times a day (QID) | ORAL | Status: DC | PRN

## 2011-03-14 MED ORDER — FOLIC ACID 5 MG/ML IJ SOLN
INTRAMUSCULAR | Status: AC
  Filled 2011-03-14: qty 0.2

## 2011-03-14 MED ORDER — ENOXAPARIN SODIUM 40 MG/0.4ML ~~LOC~~ SOLN
40.0000 mg | SUBCUTANEOUS | Status: DC
  Administered 2011-03-14 – 2011-03-15 (×2): 40 mg via SUBCUTANEOUS
  Filled 2011-03-14 (×2): qty 0.4

## 2011-03-14 MED ORDER — BISACODYL 10 MG RE SUPP: 10.0000 mg | RECTAL | Status: DC | PRN

## 2011-03-14 MED ORDER — LORAZEPAM 2 MG/ML IJ SOLN: 1.0000 mg | Freq: Four times a day (QID) | INTRAMUSCULAR | Status: DC | PRN

## 2011-03-14 MED ORDER — ACETAMINOPHEN 650 MG RE SUPP: 650.0000 mg | Freq: Four times a day (QID) | RECTAL | Status: DC | PRN

## 2011-03-14 MED ORDER — LORAZEPAM 2 MG/ML IJ SOLN
0.0000 mg | Freq: Four times a day (QID) | INTRAMUSCULAR | Status: DC
  Administered 2011-03-14: 2 mg via INTRAVENOUS
  Filled 2011-03-14: qty 1

## 2011-03-14 MED ORDER — FLEET ENEMA 7-19 GM/118ML RE ENEM: 1.0000 | ENEMA | RECTAL | Status: DC | PRN

## 2011-03-14 MED ORDER — LORAZEPAM 2 MG/ML IJ SOLN
1.0000 mg | Freq: Once | INTRAMUSCULAR | Status: AC
  Administered 2011-03-14: 1 mg via INTRAVENOUS
  Filled 2011-03-14: qty 1

## 2011-03-14 MED ORDER — ONDANSETRON HCL 4 MG/2ML IJ SOLN: 4.0000 mg | Freq: Four times a day (QID) | INTRAMUSCULAR | Status: DC | PRN

## 2011-03-14 MED ORDER — VITAMIN B-1 100 MG PO TABS
100.0000 mg | ORAL_TABLET | Freq: Every day | ORAL | Status: DC
  Administered 2011-03-15 – 2011-03-16 (×2): 100 mg via ORAL
  Filled 2011-03-14 (×3): qty 1

## 2011-03-14 MED ORDER — BENZONATATE 100 MG PO CAPS: 200.0000 mg | ORAL_CAPSULE | Freq: Three times a day (TID) | ORAL | Status: DC | PRN

## 2011-03-14 MED ORDER — RALOXIFENE HCL 60 MG PO TABS
60.0000 mg | ORAL_TABLET | Freq: Every day | ORAL | Status: DC
  Administered 2011-03-14 – 2011-03-16 (×3): 60 mg via ORAL
  Filled 2011-03-14 (×3): qty 1

## 2011-03-14 MED ORDER — SODIUM CHLORIDE 0.9 % IV BOLUS (SEPSIS)
1000.0000 mL | Freq: Once | INTRAVENOUS | Status: AC
  Administered 2011-03-14: 1000 mL via INTRAVENOUS

## 2011-03-14 MED ORDER — THIAMINE HCL 100 MG/ML IJ SOLN
INTRAMUSCULAR | Status: AC
  Filled 2011-03-14: qty 2

## 2011-03-14 NOTE — ED Notes (Signed)
Patient placed on bedpan. Passed approximately 300 cc clear, light yellow urine. Attempting second iv access at this time.

## 2011-03-14 NOTE — ED Notes (Signed)
Called icu for report. Nurse to call back in 10 minutes for report. Patient in no distress. Denies any needs at this time. Pain 4\10. Equal chest rise and fall. Call bell within reach. Bed in low position and locked with side rails up.

## 2011-03-14 NOTE — ED Notes (Signed)
Patient report given to oncoming shift. Assuming care of patient. Patient resting in bed on left side. Equal chest rise and fall. No distress. Call bell within reach. Will continue to monitor. Bed in low position and locked with side rails up.

## 2011-03-14 NOTE — ED Provider Notes (Signed)
History  Scribed for Joya Gaskins, MD, the patient was seen in [facility]. The chart was scribed by Gilman Schmidt. The patients care was started at 4:01 PM. CSN: [id number] Arrival date & time: 03/14/2011  3:09 PM   First MD Initiated Contact with Patient 03/14/11 1546      Chief Complaint  Patient presents with  . Chest Pain    History is limited due to condition of patient. Level 5 Caveat HPI Molly Campbell is a 50 y.o. female who presents to the Emergency Department complaining of chest pain. Pt additionally notes coughing up blood, numbness, and back pain. Pt has been drinking alcohol and using cocaine.   She also reports shortness of breath.  History is limited due to intoxication and pt is very anxious.  It is not clear when this CP or SOB started.  Unknown what makes CP/SOB worse.         Past Medical History  Diagnosis Date  . Hypertension     Past Surgical History  Procedure Date  . Abdominal hysterectomy     Family History  Problem Relation Age of Onset  . Heart failure Other     History  Substance Use Topics  . Smoking status: Current Everyday Smoker -- 2.0 packs/day    Types: Cigarettes  . Smokeless tobacco: Not on file  . Alcohol Use: Yes    OB History    Grav Para Term Preterm Abortions TAB SAB Ect Mult Living   3 3 3       3       Review of Systems  Unable to perform ROS: Other  Substance Abuse  Allergies  Review of patient's allergies indicates no known allergies.  Home Medications  No current outpatient prescriptions on file.  BP 114/80  Pulse 74  Temp(Src) 98.2 F (36.8 C) (Oral)  Resp 19  SpO2 96%  Physical Exam  CONSTITUTIONAL: Well developed/well nourished, anxious, pt smells of ETOH HEAD AND FACE: Normocephalic/atraumatic EYES: EOMI/PERRL ENMT: Mucous membranes dry NECK: supple no meningeal signs SPINE:entire spine nontender CV: S1/S2 noted, no murmurs/rubs/gallops noted LUNGS: Lungs are clear to auscultation  bilaterally, no apparent distress ABDOMEN: soft, nontender, no rebound or guarding GU:no cva tenderness NEURO: Pt is awake/alert, moves all extremitiesx4 EXTREMITIES: pulses normal, full ROM SKIN: warm, color normal PSYCH:anxious  ED Course  Procedures  DIAGNOSTIC STUDIES: Oxygen Saturation is 99% on room air, normal by my interpretation.     Date: 03/14/2011  Rate: 69  Rhythm: normal sinus rhythm  QRS Axis: normal  Intervals: normal  ST/T Wave abnormalities: nonspecific ST changes  Conduction Disutrbances:none  Narrative Interpretation:   Old EKG Reviewed: unchanged Initial EKG had artifact ? t wave change at V3, but repeat EKG did not show this.  Suspicion for Wellen's syndrome is low    Results for orders placed during the hospital encounter of 03/14/11  BASIC METABOLIC PANEL      Component Value Range   Sodium 142  135 - 145 (mEq/L)   Potassium 3.6  3.5 - 5.1 (mEq/L)   Chloride 106  96 - 112 (mEq/L)   CO2 28  19 - 32 (mEq/L)   Glucose, Bld 84  70 - 99 (mg/dL)   BUN 7  6 - 23 (mg/dL)   Creatinine, Ser 4.09  0.50 - 1.10 (mg/dL)   Calcium 9.1  8.4 - 81.1 (mg/dL)   GFR calc non Af Amer >90  >90 (mL/min)   GFR calc Af Amer >90  >90 (  mL/min)  CBC      Component Value Range   WBC 6.3  4.0 - 10.5 (K/uL)   RBC 4.04  3.87 - 5.11 (MIL/uL)   Hemoglobin 13.8  12.0 - 15.0 (g/dL)   HCT 16.1  09.6 - 04.5 (%)   MCV 98.3  78.0 - 100.0 (fL)   MCH 34.2 (*) 26.0 - 34.0 (pg)   MCHC 34.8  30.0 - 36.0 (g/dL)   RDW 40.9  81.1 - 91.4 (%)   Platelets 455 (*) 150 - 400 (K/uL)  DIFFERENTIAL      Component Value Range   Neutrophils Relative 24 (*) 43 - 77 (%)   Neutro Abs 1.5 (*) 1.7 - 7.7 (K/uL)   Lymphocytes Relative 63 (*) 12 - 46 (%)   Lymphs Abs 4.0  0.7 - 4.0 (K/uL)   Monocytes Relative 10  3 - 12 (%)   Monocytes Absolute 0.6  0.1 - 1.0 (K/uL)   Eosinophils Relative 1  0 - 5 (%)   Eosinophils Absolute 0.1  0.0 - 0.7 (K/uL)   Basophils Relative 2 (*) 0 - 1 (%)   Basophils  Absolute 0.1  0.0 - 0.1 (K/uL)  POCT I-STAT TROPONIN I      Component Value Range   Troponin i, poc 0.00  0.00 - 0.08 (ng/mL)   Comment 3           POCT I-STAT TROPONIN I      Component Value Range   Troponin i, poc 0.09 (*) 0.00 - 0.08 (ng/mL)   Comment 3           ETHANOL      Component Value Range   Alcohol, Ethyl (B) 311 (*) 0 - 11 (mg/dL)     RADIOLOGY: DG Chest 2 View. Reviewed by me. IMPRESSION: 1. No acute cardiopulmonary disease. 2. Bony mineralization appears decreased. Original Report Authenticated By: Britta Mccreedy, M.D.    COORDINATION OF CARE: 4:01PM:  - Patient evaluated by ED physician, ASA, Ativan, DG Chest, labs, i-Stat, ED EKG  4:49 PM Pt poor historian, difficult to understand due to etoh use.  Will monitor ED, check cxr/labs and reassess 6:46 PM Pt eating a meal, no distress  7:39 PM Pt reports her CP is improved, though she does still appear intoxicated Repeat EKG shows no acute ST abnormality, but does have +troponin.  She does admit to recent cocaine use.  Will call cardiology  8:05 PM D/w dr bensihmon, discussed EKG findings, troponin level, and given recent cocaine use, would recommend medicine admission.    D/w dr Orvan Falconer, will admit to hospital here at [facility] Pt stable at this time, feels improved  MDM  Nursing notes reviewed and considered in documentation All labs/vitals reviewed and considered xrays reviewed and considered   I personally performed the services described in this documentation, which was scribed in my presence. The recorded information has been reviewed and considered.           Joya Gaskins, MD 03/14/11 2027

## 2011-03-14 NOTE — ED Notes (Addendum)
Urine specimen obtained clean catch. Denies any needs. Resting in bed on back. Call bell within reach. Pain 4\10. Placed on 2L O2 [place].

## 2011-03-14 NOTE — ED Notes (Signed)
md at bedside to speak with patient about plan of care and test results.

## 2011-03-14 NOTE — ED Notes (Signed)
MD campbell evaluation complete. Transferring patient to ICU.

## 2011-03-14 NOTE — ED Notes (Signed)
Patient c/o left side chest pain that radiates into back. Patient reports nausea, vomiting and shortness of breath. Per patient chest pain x2 weeks progressively getting worse. Per patient seen Dr Olena Leatherwood on 10/29 and was told to come to hospital for x-rays but states she didn't have a ride.

## 2011-03-14 NOTE — ED Notes (Signed)
Report given to [facility], RN in ICU. MD Orvan Falconer at bedside to evaluate. Ready for transfer after MD eval.

## 2011-03-14 NOTE — H&P (Signed)
PCP:   HASANAJ,XAJE A, MD   Chief Complaint:  Chest pain unclear duration  HPI: Molly Campbell is an 50 y.o. African American female.  Chronic alcohol tobacco and cocaine abuse, who presents to the emergency room intoxicated complaining of chest pain. On initial presentation patient was noted to have inverted T waves in some EKG chest leads, but after aspirin and observation, EKG changes resolved. because of the EKG changes and patient's history of cocaine use of the hospitalist service was called to assist with management.  Patient is intoxicated and somewhat confused and the history is not 100% reliable. She reports she's been having a cough and shortness of breath for about 2 weeks which she has visited her primary care physician. And for which she has been taking doxycycline. She reports the chest pain is caused by coughing and is aggravated by breathing. At the same time she says that she chest pain today is sometimes present even when she is not coughing and not breathing. There is no clear history of dizziness diaphoresis nausea or vomiting. There is no history of passage of bloody or black stool  She reports she drinks only a 40 ounce of beer every day, but she does get the shakes if she stops drinking for a few days. She is unclear about her liver disease. She reports a desire to stop smoking stop drinking and stop tobacco abuse. She lives alone. Last cocaine use was 5 days ago.  She has a growth on the left buccal mucosa, unclear duration, which she would like to get removed.  Past Medical History  Diagnosis Date  . Hypertension   . Tobacco abuse   . Alcohol abuse   . Cocaine abuse     Past Surgical History  Procedure Date  . Abdominal hysterectomy     Medications:  HOME MEDS: Prior to Admission medications   Medication Sig Start Date End Date Taking? Authorizing Provider  albuterol (VENTOLIN HFA) 108 (90 BASE) MCG/ACT inhaler Inhale 1 puff into the lungs 4 (four)  times daily.      Historical Provider, MD  benazepril-hydrochlorthiazide (LOTENSIN HCT) 20-12.5 MG per tablet Take 1 tablet by mouth daily.      Historical Provider, MD  benzonatate (TESSALON) 200 MG capsule Take 200 mg by mouth 3 (three) times daily as needed. For cough     Historical Provider, MD  doxycycline (VIBRA-TABS) 100 MG tablet Take 100 mg by mouth 2 (two) times daily.      Historical Provider, MD  HYDROcodone-acetaminophen (LORTAB) 7.5-500 MG per tablet Take 1 tablet by mouth 3 (three) times daily. For 30 days     Historical Provider, MD  LORazepam (ATIVAN) 1 MG tablet Take 1 mg by mouth 3 (three) times daily.      Historical Provider, MD  OLANZapine (ZYPREXA) 20 MG tablet Take 20 mg by mouth daily.      Historical Provider, MD  raloxifene (EVISTA) 60 MG tablet Take 60 mg by mouth daily.      Historical Provider, MD      Allergies:  No Known Allergies  Social History:   reports that she has been smoking Cigarettes.  She has been smoking about 2 packs per day. She does not have any smokeless tobacco history on file. She reports that she drinks alcohol. She reports that she uses illicit drugs (Cocaine and Marijuana).  Family History: Family History  Problem Relation Age of Onset  . Heart failure Other     Rewiew of  Systems:  The patient denies anorexia, fever, weight loss,, vision loss, decreased hearing, hoarseness, chest pain, syncope, dyspnea on exertion, peripheral edema, balance deficits, hemoptysis, abdominal pain, melena, hematochezia, severe indigestion/heartburn, hematuria, incontinence, genital sores, muscle weakness, suspicious skin lesions, transient blindness, difficulty walking, depression, unusual weight change, abnormal bleeding, enlarged lymph nodes, angioedema, and breast masses.   Physical Exam: Filed Vitals:   03/14/11 1651 03/14/11 1800 03/14/11 2023 03/14/11 2112  BP: 131/82 131/80 117/102 146/93  Pulse: 96  96 62  Temp:   97.7 F (36.5 C) 98.1 F (36.7  C)  TempSrc:   Oral Oral  Resp: 22 18 18 23   Height:    5\' 2"  (1.575 m)  Weight:    47.1 kg (103 lb 13.4 oz)  SpO2:   98% 100%   Blood pressure 146/93, pulse 62, temperature 98.1 F (36.7 C), temperature source Oral, resp. rate 23, height 5\' 2"  (1.575 m), weight 47.1 kg (103 lb 13.4 oz), SpO2 100.00%.  GEN:  confused intoxicated middle-aged African American woman lying in the stretcher. PSYCH: Shes alert  but mental status is fluctuating  does not appear anxious does not appear depressed; keeps eyes closed  HEENT: Mucous membranes pink dry  and anicteric; PERRLA; EOM intact; no cervical lymphadenopathy nor thyromegaly or carotid bruit;  distended external jugular veins ; Breasts:: Not examined CHEST WALL: No tenderness CHEST: Normal respiration, clear to auscultation bilaterally HEART: Regular rate and rhythm; no murmurs rubs or gallops BACK:  no CVA tenderness ABDOMEN:  Mild and fluctuating abdominal tenderness unclear if this is just jitteriness ; no masses, no organomegaly, normal abdominal bowel sounds; no pannus; no intertriginous candida. Rectal Exam: Not done EXTREMITIES:  age-appropriate arthropathy of the hands and knees; no edema; no ulcerations. Genitalia: not examined PULSES: 2+ and symmetric SKIN: Normal hydration no rash or ulceration CNS: Cranial nerves 2-12 grossly intact no focal neurologic deficit   Labs & Imaging Results for orders placed during the hospital encounter of 03/14/11 (from the past 48 hour(s))  BASIC METABOLIC PANEL     Status: Normal   Collection Time   03/14/11  4:16 PM      Component Value Range Comment   Sodium 142  135 - 145 (mEq/L)    Potassium 3.6  3.5 - 5.1 (mEq/L)    Chloride 106  96 - 112 (mEq/L)    CO2 28  19 - 32 (mEq/L)    Glucose, Bld 84  70 - 99 (mg/dL)    BUN 7  6 - 23 (mg/dL)    Creatinine, Ser 5.78  0.50 - 1.10 (mg/dL)    Calcium 9.1  8.4 - 10.5 (mg/dL)    GFR calc non Af Amer >90  >90 (mL/min)    GFR calc Af Amer >90  >90  (mL/min)   CBC     Status: Abnormal   Collection Time   03/14/11  4:16 PM      Component Value Range Comment   WBC 6.3  4.0 - 10.5 (K/uL)    RBC 4.04  3.87 - 5.11 (MIL/uL)    Hemoglobin 13.8  12.0 - 15.0 (g/dL)    HCT 46.9  62.9 - 52.8 (%)    MCV 98.3  78.0 - 100.0 (fL)    MCH 34.2 (*) 26.0 - 34.0 (pg)    MCHC 34.8  30.0 - 36.0 (g/dL)    RDW 41.3  24.4 - 01.0 (%)    Platelets 455 (*) 150 - 400 (K/uL)   DIFFERENTIAL  Status: Abnormal   Collection Time   03/14/11  4:16 PM      Component Value Range Comment   Neutrophils Relative 24 (*) 43 - 77 (%)    Neutro Abs 1.5 (*) 1.7 - 7.7 (K/uL)    Lymphocytes Relative 63 (*) 12 - 46 (%)    Lymphs Abs 4.0  0.7 - 4.0 (K/uL)    Monocytes Relative 10  3 - 12 (%)    Monocytes Absolute 0.6  0.1 - 1.0 (K/uL)    Eosinophils Relative 1  0 - 5 (%)    Eosinophils Absolute 0.1  0.0 - 0.7 (K/uL)    Basophils Relative 2 (*) 0 - 1 (%)    Basophils Absolute 0.1  0.0 - 0.1 (K/uL)   ETHANOL     Status: Abnormal   Collection Time   03/14/11  4:16 PM      Component Value Range Comment   Alcohol, Ethyl (B) 311 (*) 0 - 11 (mg/dL)   POCT I-STAT TROPONIN I     Status: Normal   Collection Time   03/14/11  4:25 PM      Component Value Range Comment   Troponin i, poc 0.00  0.00 - 0.08 (ng/mL)    Comment 3            POCT I-STAT TROPONIN I     Status: Abnormal   Collection Time   03/14/11  6:46 PM      Component Value Range Comment   Troponin i, poc 0.09 (*) 0.00 - 0.08 (ng/mL)    Comment 3            URINE RAPID DRUG SCREEN (HOSP PERFORMED)     Status: Normal   Collection Time   03/14/11  8:04 PM      Component Value Range Comment   Opiates NONE DETECTED  NONE DETECTED     Cocaine NONE DETECTED  NONE DETECTED     Benzodiazepines NONE DETECTED  NONE DETECTED     Amphetamines NONE DETECTED  NONE DETECTED     Tetrahydrocannabinol NONE DETECTED  NONE DETECTED     Barbiturates NONE DETECTED  NONE DETECTED     Dg Chest 2 View  03/14/2011  *RADIOLOGY  REPORT*  Clinical Data: Chest pain  CHEST - 2 VIEW  Comparison: 11/22/2010 and 08/05/2010  Findings: Heart, mediastinal, and hilar contours are within normal limits.  Trachea is midline.  Skin fold projects over the lateral right chest.  No focal airspace disease, effusion, or pneumothorax. No acute or suspicious bony abnormality.  Bony mineralization appears decreased.  IMPRESSION:  1.  No acute cardiopulmonary disease. 2.  Bony mineralization appears decreased.  Original Report Authenticated By: Britta Mccreedy, M.D.      Assessment Present on Admission:  .Alcohol intoxication .Chest pain .Cocaine abuse .HTN (hypertension) .Alcohol abuse .Tobacco abuse  PLAN: This lady was discussed between the on-call cardiologist Dr. Teena Dunk and the emergency room physician. Cardiologist recommended admitting the patient for cardiac rule out based on the EKG changes. This seems reasonable we'll do serial cardiac enzymes and maintain her on telemetry.   Her alcohol level is remarkably elevated for somebody will reportedly only drinks a 40 ounce of beer per day, and we will put her on a withdrawal protocol, and counsel her on drug abuse cessation. She can be set up with out patient rehabilitation at the time of discharge.   She is clinically dehydrated so will hold her diuretic, given hydration, and monitor her  blood pressure as needed.   Other plans as per orders.   Jiya Kissinger 03/14/2011, 9:56 PM

## 2011-03-15 ENCOUNTER — Other Ambulatory Visit: Payer: Self-pay

## 2011-03-15 LAB — CARDIAC PANEL(CRET KIN+CKTOT+MB+TROPI)
CK, MB: 2.5 ng/mL (ref 0.3–4.0)
CK, MB: 2.6 ng/mL (ref 0.3–4.0)
Relative Index: 2.2 (ref 0.0–2.5)
Relative Index: INVALID (ref 0.0–2.5)
Total CK: 112 U/L (ref 7–177)
Total CK: 87 U/L (ref 7–177)
Total CK: 80 U/L (ref 7–177)
Troponin I: <0.3 ng/mL (ref <0.30)
Troponin I: <0.3 ng/mL (ref <0.30)

## 2011-03-15 LAB — BASIC METABOLIC PANEL
BUN: 7 mg/dL (ref 6–23)
CO2: 24 mEq/L (ref 19–32)
Calcium: 8.5 mg/dL (ref 8.4–10.5)
GFR calc non Af Amer: >90 mL/min (ref >90)
Glucose, Bld: 77 mg/dL (ref 70–99)
Sodium: 142 mEq/L (ref 135–145)

## 2011-03-15 LAB — HEMOGLOBIN A1C: Hgb A1c MFr Bld: 5.3 % (ref <5.7)

## 2011-03-15 LAB — URINALYSIS, ROUTINE W REFLEX MICROSCOPIC
Bilirubin Urine: NEGATIVE
Glucose, UA: NEGATIVE mg/dL
Hgb urine dipstick: NEGATIVE
Protein, ur: NEGATIVE mg/dL
Urobilinogen, UA: 0.2 mg/dL (ref 0.0–1.0)

## 2011-03-15 LAB — CBC
Hemoglobin: 12.5 g/dL (ref 12.0–15.0)
MCH: 34.7 pg — ABNORMAL HIGH (ref 26.0–34.0)
MCHC: 34.9 g/dL (ref 30.0–36.0)
MCV: 99.4 fL (ref 78.0–100.0)
Platelets: 387 10*3/uL (ref 150–400)
RBC: 3.6 MIL/uL — ABNORMAL LOW (ref 3.87–5.11)

## 2011-03-15 MED ORDER — THIAMINE HCL 100 MG/ML IJ SOLN: Freq: Once | INTRAVENOUS | Status: AC

## 2011-03-15 MED ORDER — CLONIDINE HCL 0.1 MG PO TABS
0.1000 mg | ORAL_TABLET | Freq: Two times a day (BID) | ORAL | Status: DC
  Administered 2011-03-15 – 2011-03-16 (×3): 0.1 mg via ORAL
  Filled 2011-03-15 (×2): qty 1

## 2011-03-15 MED ORDER — INFLUENZA VIRUS VACC SPLIT PF IM SUSP: 0.5000 mL | Freq: Once | INTRAMUSCULAR | Status: DC

## 2011-03-15 MED ORDER — INFLUENZA VIRUS VACC SPLIT PF IM SUSP
0.5000 mL | Freq: Once | INTRAMUSCULAR | Status: AC
  Administered 2011-03-15: 0.5 mL via INTRAMUSCULAR
  Filled 2011-03-15: qty 0.5

## 2011-03-15 MED ORDER — PANTOPRAZOLE SODIUM 40 MG IV SOLR
40.0000 mg | INTRAVENOUS | Status: DC
  Administered 2011-03-15 – 2011-03-16 (×2): 40 mg via INTRAVENOUS
  Filled 2011-03-15 (×2): qty 40

## 2011-03-15 NOTE — Progress Notes (Signed)
Subjective: Unable.  Objective: Vital signs in last 24 hours: Filed Vitals:   03/15/11 1400 03/15/11 1500 03/15/11 1600 03/15/11 1700  BP: 184/85 156/90 143/81 153/94  Pulse: 63 73 73   Temp:   97.8 F (36.6 C)   TempSrc:   Oral   Resp: 18 20 19 21   Height:      Weight:      SpO2: 97% 100% 100%    Weight change:   Intake/Output Summary (Last 24 hours) at 03/15/11 1755 Last data filed at 03/15/11 1500  Gross per 24 hour  Intake   2005 ml  Output    850 ml  Net   1155 ml   Physical Exam: Gen.: Very somnolent. Briefly arousable, but falls back asleep. Lungs clear to auscultation bilaterally without wheeze rhonchi or rales Cardiovascular regular rate rhythm without murmurs gallops rubs Abdomen soft nontender nondistended  extremities no clubbing cyanosis or edema Lab Results: Basic Metabolic Panel:  Lab 03/15/11 1610 03/14/11 1616  NA 142 142  K 3.7 3.6  CL 112 106  CO2 24 28  GLUCOSE 77 84  BUN 7 7  CREATININE 0.69 0.79  CALCIUM 8.5 9.1  MG -- 1.7  PHOS -- 4.7*   Liver Function Tests:  Lab 03/14/11 1616  AST 51*  ALT 58*  ALKPHOS 67  BILITOT 0.2*  PROT 8.2  ALBUMIN 3.9   No results found for this basename: LIPASE:2,AMYLASE:2 in the last 168 hours No results found for this basename: AMMONIA:2 in the last 168 hours CBC:  Lab 03/15/11 0419 03/14/11 1616  WBC 4.5 6.3  NEUTROABS -- 1.5*  HGB 12.5 13.8  HCT 35.8* 39.7  MCV 99.4 98.3  PLT 387 455*   Cardiac Enzymes:  Lab 03/15/11 1535 03/15/11 0830 03/14/11 2322  CKTOTAL 80 87 112  CKMB 2.6 2.5 2.5  CKMBINDEX -- -- --  TROPONINI <0.30 <0.30 <0.30   BNP: No results found for this basename: POCBNP:3 in the last 168 hours D-Dimer: No results found for this basename: DDIMER:2 in the last 168 hours CBG: No results found for this basename: GLUCAP:6 in the last 168 hours Hemoglobin A1C: No results found for this basename: HGBA1C in the last 168 hours Fasting Lipid Panel: No results found for this  basename: CHOL,HDL,LDLCALC,TRIG,CHOLHDL,LDLDIRECT in the last 960 hours Thyroid Function Tests:  Lab 03/14/11 1616  TSH 0.785  T4TOTAL --  FREET4 --  T3FREE --  THYROIDAB --   Coagulation:  Lab 03/14/11 1616  LABPROT 13.0  INR 0.96    Micro Results: No results found for this or any previous visit (from the past 240 hour(s)). Studies/Results: Dg Chest 2 View  03/14/2011  *RADIOLOGY REPORT*  Clinical Data: Chest pain  CHEST - 2 VIEW  Comparison: 11/22/2010 and 08/05/2010  Findings: Heart, mediastinal, and hilar contours are within normal limits.  Trachea is midline.  Skin fold projects over the lateral right chest.  No focal airspace disease, effusion, or pneumothorax. No acute or suspicious bony abnormality.  Bony mineralization appears decreased.  IMPRESSION:  1.  No acute cardiopulmonary disease. 2.  Bony mineralization appears decreased.  Original Report Authenticated By: Britta Mccreedy, M.D.    Scheduled Meds:   . aspirin EC  81 mg Oral Daily  . cloNIDine  0.1 mg Oral BID  . enoxaparin  40 mg Subcutaneous Q24H  . folic acid  1 mg Oral Daily  . guaiFENesin  1,200 mg Oral BID  . influenza  inactive virus vaccine  0.5 mL  Intramuscular Once  . LORazepam  0-4 mg Intravenous Q6H   Followed by  . LORazepam  0-4 mg Intravenous Q12H  . multivitamins ther. w/minerals  1 tablet Oral Daily  . nicotine  21 mg Transdermal Daily  . OLANZapine  20 mg Oral Daily  . pantoprazole (PROTONIX) IV  40 mg Intravenous Q24H  . raloxifene  60 mg Oral Daily  . general admission iv infusion   Intravenous Once  . general admission iv infusion   Intravenous Once  . thiamine  100 mg Oral Daily   Or  . thiamine  100 mg Intravenous Daily  . DISCONTD: folic acid  1 mg Oral Daily  . DISCONTD: influenza  inactive virus vaccine  0.5 mL Intramuscular Once   repeat EKG shows normal sinus rhythm. Continuous Infusions:  PRN Meds:.acetaminophen, benzonatate, bisacodyl, docusate sodium, HYDROmorphone,  LORazepam, LORazepam, ondansetron (ZOFRAN) IV, ondansetron, sodium phosphate, traZODone, DISCONTD: acetaminophen Assessment/Plan: Principal Problem:  *Chest pain Active Problems:  HTN (hypertension)  Tobacco abuse  Alcohol abuse  Cocaine abuse  Alcohol intoxication  Patient has ruled out for MI. She is still too somnolent for discharge. She needs to quit drinking and smoking crack cocaine. Transferred to telemetry.  LOS: 1 day   Molly Campbell L 03/15/2011, 5:55 PM

## 2011-03-15 NOTE — Progress Notes (Signed)
Report given to [facility] on Dept300.  Pt is going to be transferred to room 320. Pt is drowsy, easily aroused and then goes back to sleep.  Pt is A&Ox3, VSS.  Pt taken to room 320 via wheelchair by [place], NT.

## 2011-03-16 ENCOUNTER — Encounter (HOSPITAL_COMMUNITY): Payer: Self-pay | Admitting: Internal Medicine

## 2011-03-16 DIAGNOSIS — K7010 Alcoholic hepatitis without ascites: Secondary | ICD-10-CM | POA: Diagnosis present

## 2011-03-16 HISTORY — DX: Alcoholic hepatitis without ascites: K70.10

## 2011-03-16 MED ORDER — OLANZAPINE 20 MG PO TABS: 20.0000 mg | ORAL_TABLET | Freq: Every day | ORAL | Status: DC

## 2011-03-16 MED ORDER — THERA M PLUS PO TABS: 1.0000 | ORAL_TABLET | Freq: Every day | ORAL | Status: DC

## 2011-03-16 MED ORDER — OMEPRAZOLE 40 MG PO CPDR: 40.0000 mg | DELAYED_RELEASE_CAPSULE | Freq: Every day | ORAL | Status: DC

## 2011-03-16 MED ORDER — SODIUM CHLORIDE 0.9 % IJ SOLN
INTRAMUSCULAR | Status: AC
  Filled 2011-03-16: qty 3

## 2011-03-16 MED ORDER — THIAMINE HCL 100 MG PO TABS: 100.0000 mg | ORAL_TABLET | Freq: Every day | ORAL | Status: AC

## 2011-03-16 MED ORDER — PANTOPRAZOLE SODIUM 40 MG PO TBEC: 40.0000 mg | DELAYED_RELEASE_TABLET | Freq: Every day | ORAL | Status: DC

## 2011-03-16 MED ORDER — BENAZEPRIL-HYDROCHLOROTHIAZIDE 20-12.5 MG PO TABS: 1.0000 | ORAL_TABLET | Freq: Every day | ORAL | Status: DC

## 2011-03-16 MED ORDER — LORAZEPAM 1 MG PO TABS: 1.0000 mg | ORAL_TABLET | Freq: Three times a day (TID) | ORAL | Status: DC

## 2011-03-16 NOTE — Progress Notes (Signed)
SBIRT completed with pt. Full assessment on chart. Pt declines any need for outpatient follow up.    Karn Cassis

## 2011-03-16 NOTE — Discharge Summary (Signed)
Physician Discharge Summary  Molly Campbell MRN: [id number] DOB/AGE: Sep 10, 1960 50 y.o.  PCP: Toma Deiters, MD   Admit date: 03/14/2011 Discharge date: 03/16/2011  Discharge Diagnoses:  1. Chest pain. Myocardial infarction ruled out, following marginally elevated point-of-care troponin I. 2. Alcohol abuse with intoxication. Her alcohol level was 311 on admission.     The patient refused outpatient referral for assistance. 3. History of cocaine abuse. Urine drug screen negative during this hospitalization. 4. Tobacco abuse. 5. Hypertension. 6. Alcoholic hepatitis. 7. History of auditory hallucinations. 8. Chronic anxiety disorder, benzodiazepine dependent.   Current Discharge Medication List    START taking these medications   Details  Multiple Vitamins-Minerals (MULTIVITAMINS THER. W/MINERALS) TABS Take 1 tablet by mouth daily. Qty: 30 each    omeprazole (PRILOSEC) 40 MG capsule Take 1 capsule (40 mg total) by mouth daily. FOR ACID REFLUX. Qty: 30 capsule, Refills: 1    thiamine 100 MG tablet Take 1 tablet (100 mg total) by mouth daily. B-VITAMIN      CONTINUE these medications which have CHANGED   Details  benazepril-hydrochlorthiazide (LOTENSIN HCT) 20-12.5 MG per tablet Take 1 tablet by mouth daily. FOR YOUR HIGH BLOOD PRESSURE. Qty: 30 tablet, Refills: 2    LORazepam (ATIVAN) 1 MG tablet Take 1 tablet (1 mg total) by mouth 3 (three) times daily. DO NOT TAKE THIS MEDICINE WITH ALCOHOL.    OLANZapine (ZYPREXA) 20 MG tablet Take 1 tablet (20 mg total) by mouth daily. Qty: 30 tablet, Refills: 1      CONTINUE these medications which have NOT CHANGED   Details  albuterol (VENTOLIN HFA) 108 (90 BASE) MCG/ACT inhaler Inhale 1 puff into the lungs 4 (four) times daily.      raloxifene (EVISTA) 60 MG tablet Take 60 mg by mouth daily.        STOP taking these medications     benzonatate (TESSALON) 200 MG capsule      doxycycline (VIBRA-TABS) 100 MG tablet     HYDROcodone-acetaminophen (LORTAB) 7.5-500 MG per tablet         Discharge Condition: Stable and improved.  Disposition: Home or Self Care   Consults: None.   Significant Diagnostic Studies: Dg Chest 2 View  03/14/2011  *RADIOLOGY REPORT*  Clinical Data: Chest pain  CHEST - 2 VIEW  Comparison: 11/22/2010 and 08/05/2010  Findings: Heart, mediastinal, and hilar contours are within normal limits.  Trachea is midline.  Skin fold projects over the lateral right chest.  No focal airspace disease, effusion, or pneumothorax. No acute or suspicious bony abnormality.  Bony mineralization appears decreased.  IMPRESSION:  1.  No acute cardiopulmonary disease. 2.  Bony mineralization appears decreased.  Original Report Authenticated By: Britta Mccreedy, M.D.     Microbiology: No results found for this or any previous visit (from the past 240 hour(s)).   Labs: Results for orders placed during the hospital encounter of 03/14/11 (from the past 48 hour(s))  BASIC METABOLIC PANEL     Status: Normal   Collection Time   03/14/11  4:16 PM      Component Value Range Comment   Sodium 142  135 - 145 (mEq/L)    Potassium 3.6  3.5 - 5.1 (mEq/L)    Chloride 106  96 - 112 (mEq/L)    CO2 28  19 - 32 (mEq/L)    Glucose, Bld 84  70 - 99 (mg/dL)    BUN 7  6 - 23 (mg/dL)    Creatinine, Ser 9.52  0.50 - 1.10 (mg/dL)    Calcium 9.1  8.4 - 10.5 (mg/dL)    GFR calc non Af Amer >90  >90 (mL/min)    GFR calc Af Amer >90  >90 (mL/min)   CBC     Status: Abnormal   Collection Time   03/14/11  4:16 PM      Component Value Range Comment   WBC 6.3  4.0 - 10.5 (K/uL)    RBC 4.04  3.87 - 5.11 (MIL/uL)    Hemoglobin 13.8  12.0 - 15.0 (g/dL)    HCT 81.1  91.4 - 78.2 (%)    MCV 98.3  78.0 - 100.0 (fL)    MCH 34.2 (*) 26.0 - 34.0 (pg)    MCHC 34.8  30.0 - 36.0 (g/dL)    RDW 95.6  21.3 - 08.6 (%)    Platelets 455 (*) 150 - 400 (K/uL)   DIFFERENTIAL     Status: Abnormal   Collection Time   03/14/11  4:16 PM       Component Value Range Comment   Neutrophils Relative 24 (*) 43 - 77 (%)    Neutro Abs 1.5 (*) 1.7 - 7.7 (K/uL)    Lymphocytes Relative 63 (*) 12 - 46 (%)    Lymphs Abs 4.0  0.7 - 4.0 (K/uL)    Monocytes Relative 10  3 - 12 (%)    Monocytes Absolute 0.6  0.1 - 1.0 (K/uL)    Eosinophils Relative 1  0 - 5 (%)    Eosinophils Absolute 0.1  0.0 - 0.7 (K/uL)    Basophils Relative 2 (*) 0 - 1 (%)    Basophils Absolute 0.1  0.0 - 0.1 (K/uL)   ETHANOL     Status: Abnormal   Collection Time   03/14/11  4:16 PM      Component Value Range Comment   Alcohol, Ethyl (B) 311 (*) 0 - 11 (mg/dL)   HEPATIC FUNCTION PANEL     Status: Abnormal   Collection Time   03/14/11  4:16 PM      Component Value Range Comment   Total Protein 8.2  6.0 - 8.3 (g/dL)    Albumin 3.9  3.5 - 5.2 (g/dL)    AST 51 (*) 0 - 37 (U/L)    ALT 58 (*) 0 - 35 (U/L)    Alkaline Phosphatase 67  39 - 117 (U/L)    Total Bilirubin 0.2 (*) 0.3 - 1.2 (mg/dL)    Bilirubin, Direct <5.7  0.0 - 0.3 (mg/dL)    Indirect Bilirubin NOT CALCULATED  0.3 - 0.9 (mg/dL)   MAGNESIUM     Status: Normal   Collection Time   03/14/11  4:16 PM      Component Value Range Comment   Magnesium 1.7  1.5 - 2.5 (mg/dL)   PHOSPHORUS     Status: Abnormal   Collection Time   03/14/11  4:16 PM      Component Value Range Comment   Phosphorus 4.7 (*) 2.3 - 4.6 (mg/dL)   APTT     Status: Normal   Collection Time   03/14/11  4:16 PM      Component Value Range Comment   aPTT 36  24 - 37 (seconds)   PROTIME-INR     Status: Normal   Collection Time   03/14/11  4:16 PM      Component Value Range Comment   Prothrombin Time 13.0  11.6 - 15.2 (seconds)    INR 0.96  0.00 - 1.49    TSH     Status: Normal   Collection Time   03/14/11  4:16 PM      Component Value Range Comment   TSH 0.785  0.350 - 4.500 (uIU/mL)   HEMOGLOBIN A1C     Status: Normal   Collection Time   03/14/11  4:16 PM      Component Value Range Comment   Hemoglobin A1C 5.3  <5.7 (%)    Mean Plasma  Glucose 105  <117 (mg/dL)   POCT I-STAT TROPONIN I     Status: Normal   Collection Time   03/14/11  4:25 PM      Component Value Range Comment   Troponin i, poc 0.00  0.00 - 0.08 (ng/mL)    Comment 3            POCT I-STAT TROPONIN I     Status: Abnormal   Collection Time   03/14/11  6:46 PM      Component Value Range Comment   Troponin i, poc 0.09 (*) 0.00 - 0.08 (ng/mL)    Comment 3            URINE RAPID DRUG SCREEN (HOSP PERFORMED)     Status: Normal   Collection Time   03/14/11  8:04 PM      Component Value Range Comment   Opiates NONE DETECTED  NONE DETECTED     Cocaine NONE DETECTED  NONE DETECTED     Benzodiazepines NONE DETECTED  NONE DETECTED     Amphetamines NONE DETECTED  NONE DETECTED     Tetrahydrocannabinol NONE DETECTED  NONE DETECTED     Barbiturates NONE DETECTED  NONE DETECTED    CARDIAC PANEL(CRET KIN+CKTOT+MB+TROPI)     Status: Normal   Collection Time   03/14/11 11:22 PM      Component Value Range Comment   Total CK 112  7 - 177 (U/L)    CK, MB 2.5  0.3 - 4.0 (ng/mL)    Troponin I <0.30  <0.30 (ng/mL)    Relative Index 2.2  0.0 - 2.5    BASIC METABOLIC PANEL     Status: Normal   Collection Time   03/15/11  4:19 AM      Component Value Range Comment   Sodium 142  135 - 145 (mEq/L)    Potassium 3.7  3.5 - 5.1 (mEq/L)    Chloride 112  96 - 112 (mEq/L)    CO2 24  19 - 32 (mEq/L)    Glucose, Bld 77  70 - 99 (mg/dL)    BUN 7  6 - 23 (mg/dL)    Creatinine, Ser 1.61  0.50 - 1.10 (mg/dL)    Calcium 8.5  8.4 - 10.5 (mg/dL)    GFR calc non Af Amer >90  >90 (mL/min)    GFR calc Af Amer >90  >90 (mL/min)   CBC     Status: Abnormal   Collection Time   03/15/11  4:19 AM      Component Value Range Comment   WBC 4.5  4.0 - 10.5 (K/uL)    RBC 3.60 (*) 3.87 - 5.11 (MIL/uL)    Hemoglobin 12.5  12.0 - 15.0 (g/dL)    HCT 09.6 (*) 04.5 - 46.0 (%)    MCV 99.4  78.0 - 100.0 (fL)    MCH 34.7 (*) 26.0 - 34.0 (pg)    MCHC 34.9  30.0 - 36.0 (g/dL)    RDW 13.3  11.5 - 15.5  (%)    Platelets 387  150 - 400 (K/uL)   CARDIAC PANEL(CRET KIN+CKTOT+MB+TROPI)     Status: Normal   Collection Time   03/15/11  8:30 AM      Component Value Range Comment   Total CK 87  7 - 177 (U/L)    CK, MB 2.5  0.3 - 4.0 (ng/mL)    Troponin I <0.30  <0.30 (ng/mL)    Relative Index RELATIVE INDEX IS INVALID  0.0 - 2.5    URINALYSIS, ROUTINE W REFLEX MICROSCOPIC     Status: Normal   Collection Time   03/15/11  3:13 PM      Component Value Range Comment   Color, Urine YELLOW  YELLOW     Appearance CLEAR  CLEAR     Specific Gravity, Urine 1.020  1.005 - 1.030     pH 5.5  5.0 - 8.0     Glucose, UA NEGATIVE  NEGATIVE (mg/dL)    Hgb urine dipstick NEGATIVE  NEGATIVE     Bilirubin Urine NEGATIVE  NEGATIVE     Ketones, ur NEGATIVE  NEGATIVE (mg/dL)    Protein, ur NEGATIVE  NEGATIVE (mg/dL)    Urobilinogen, UA 0.2  0.0 - 1.0 (mg/dL)    Nitrite NEGATIVE  NEGATIVE     Leukocytes, UA NEGATIVE  NEGATIVE  MICROSCOPIC NOT DONE ON URINES WITH NEGATIVE PROTEIN, BLOOD, LEUKOCYTES, NITRITE, OR GLUCOSE <1000 mg/dL.  CARDIAC PANEL(CRET KIN+CKTOT+MB+TROPI)     Status: Normal   Collection Time   03/15/11  3:35 PM      Component Value Range Comment   Total CK 80  7 - 177 (U/L)    CK, MB 2.6  0.3 - 4.0 (ng/mL)    Troponin I <0.30  <0.30 (ng/mL)    Relative Index RELATIVE INDEX IS INVALID  0.0 - 2.5       HPI : The patient is a 50 year old woman with a past medical history significant for alcohol abuse, cocaine abuse, tobacco abuse, hypertension, and auditory hallucinations. She presented to the emergency department on 03/14/2011 with a chief complaint of chest pain. She was noted to be intoxicated. Her blood alcohol level was 311. Her urine drug screen was negative. Her EKG revealed normal sinus rhythm with a heart rate of 69 beats per minute and nonspecific ST changes; questionable T wave change in V3 versus artifact. Her chest x-ray revealed no acute cardiopulmonary disease. She was afebrile and  hemodynamically stable. She was admitted for further evaluation and management.  HOSPITAL COURSE: During the initial assessment in the emergency department, the emergency department physician apparently called the cardiologist on-call at [facility] to discuss the patient's clinical findings. According to the history recorded, it was recommended that the patient stay here at [facility] for further management and evaluation. She was started on supportive treatment with IV fluid hydration, Ativan alcohol withdrawal protocol, and vitamin therapy. Aspirin was started empirically. Her pain was treated with as needed hydromorphone. While she was being hydrated, benazepril/HCTZ was discontinued. Clonidine was started at 0.1 mg twice a day for treatment of hypertension. All of her other chronic medications were continued.  For further evaluation, a number of studies were ordered. Her cardiac enzymes were all within normal limits x3. The initial elevated troponin I. of 0.09 was 8.5 care marker taken in the ED, however, her cardiac enzymes were completely within normal limits from the venous specimens collected. Her followup EKG revealed normal sinus rhythm with  resolution of the T-wave changes seen in the lateral leads. Her TSH was within normal limits at 0.785. Her hemoglobin A1c was within normal limits 5.3. Her liver transaminases were slightly elevated with an AST of 51 and an ALT of 58. Her blood magnesium level was within normal limits at 1.7.  The patient takes Zyprexa and lorazepam chronically. I asked her what her why she was taking these medications. She stated that she had a history of hearing voices and bad nerves.  The patient's chest pain resolved. Although she was hypertensive, she remained hemodynamically stable. The social worker was consulted to assist the patient with a referral for substance abuse treatment. However, she refused. The dictating physician strongly encouraged her to  stop abusing alcohol, tobacco, and illicit drugs and to seek help. The patient stated that she could stop on her own, however, it did not appear that she was intent on stopping. Upon discharge, she was advised to restart benazepril/HCTZ. She was given a prescription for omeprazole, as it was possible that her chest pain may have been secondary to alcohol-induced esophagitis or gastritis or gastroesophageal reflux. I also instructed her to continue vitamin therapy.    Discharge Exam:  Blood pressure 166/96, pulse 63, temperature 97.8 F (36.6 C), temperature source Oral, resp. rate 16, height 5\' 2"  (1.575 m), weight 50.3 kg (110 lb 14.3 oz), SpO2 97.00%.  General: 50 year-old [place] American woman who is currently eating lunch, in no acute distress. Lungs: Clear to auscultation bilaterally. Heart: S1, S2, with a soft systolic murmur. Abdomen: Positive bowel sounds, soft, nontender, nondistended. Extremities: No pedal edema. Neurologic/psychological: She is alert and oriented x3. Affect is flat. Speech is clear. No tremulousness.   Discharge Orders    Future Orders Please Complete By Expires   Diet - low sodium heart healthy      Increase activity slowly      Discharge instructions      Comments:   AVOID DRINKING ALCOHOL. AVOID SMOKING AND TAKING DRUGS NOT PRESCRIBED. SEEK HELP FOR YOU ADDICTIONS.      Follow-up Information    Follow up with HASANAJ,XAJE A on 03/31/2011. (BE THERE AT 1:20 PM.)          Signed: Francies Inch 03/16/2011, 2:16 PM

## 2011-03-16 NOTE — Progress Notes (Signed)
Patient discharged to home in the care of friends. Transported  via wheelchair to entrance without incident. Discharge instructions explained in detail to patient. Verbalized understanding of all. Per Dr. Sherrie Mustache, patient refused the scripts that she had written for her.

## 2011-03-17 LAB — URINE CULTURE: Colony Count: 7000

## 2011-03-20 NOTE — Progress Notes (Signed)
Utilization review completed.  

## 2011-03-31 ENCOUNTER — Emergency Department (HOSPITAL_COMMUNITY): Payer: Medicaid Other

## 2011-03-31 ENCOUNTER — Emergency Department (HOSPITAL_COMMUNITY)
Admission: EM | Admit: 2011-03-31 | Discharge: 2011-04-01 | Disposition: A | Discharge status: Home / Self Care | Payer: Medicaid Other | Attending: Emergency Medicine | Admitting: Emergency Medicine

## 2011-03-31 ENCOUNTER — Encounter (HOSPITAL_COMMUNITY): Payer: Self-pay | Admitting: *Deleted

## 2011-03-31 ENCOUNTER — Other Ambulatory Visit: Payer: Self-pay

## 2011-03-31 DIAGNOSIS — F1210 Cannabis abuse, uncomplicated: Secondary | ICD-10-CM | POA: Insufficient documentation

## 2011-03-31 DIAGNOSIS — I10 Essential (primary) hypertension: Secondary | ICD-10-CM | POA: Insufficient documentation

## 2011-03-31 DIAGNOSIS — R7989 Other specified abnormal findings of blood chemistry: Secondary | ICD-10-CM | POA: Insufficient documentation

## 2011-03-31 DIAGNOSIS — F411 Generalized anxiety disorder: Secondary | ICD-10-CM | POA: Insufficient documentation

## 2011-03-31 DIAGNOSIS — R079 Chest pain, unspecified: Secondary | ICD-10-CM | POA: Insufficient documentation

## 2011-03-31 DIAGNOSIS — K7010 Alcoholic hepatitis without ascites: Secondary | ICD-10-CM | POA: Insufficient documentation

## 2011-03-31 DIAGNOSIS — F10929 Alcohol use, unspecified with intoxication, unspecified: Secondary | ICD-10-CM

## 2011-03-31 DIAGNOSIS — F1010 Alcohol abuse, uncomplicated: Secondary | ICD-10-CM | POA: Insufficient documentation

## 2011-03-31 DIAGNOSIS — F209 Schizophrenia, unspecified: Secondary | ICD-10-CM | POA: Insufficient documentation

## 2011-03-31 DIAGNOSIS — F17200 Nicotine dependence, unspecified, uncomplicated: Secondary | ICD-10-CM | POA: Insufficient documentation

## 2011-03-31 HISTORY — DX: Schizophrenia, unspecified: F20.9

## 2011-03-31 LAB — CBC
MCH: 34.4 pg — ABNORMAL HIGH (ref 26.0–34.0)
MCHC: 35.1 g/dL (ref 30.0–36.0)
MCV: 97.9 fL (ref 78.0–100.0)
Platelets: 505 10*3/uL — ABNORMAL HIGH (ref 150–400)
RDW: 13.4 % (ref 11.5–15.5)

## 2011-03-31 LAB — COMPREHENSIVE METABOLIC PANEL
AST: 158 U/L — ABNORMAL HIGH (ref 0–37)
Albumin: 3.8 g/dL (ref 3.5–5.2)
CO2: 27 mEq/L (ref 19–32)
Calcium: 8.8 mg/dL (ref 8.4–10.5)
Creatinine, Ser: 0.83 mg/dL (ref 0.50–1.10)
GFR calc non Af Amer: 80 mL/min — ABNORMAL LOW (ref >90)
Total Protein: 7.8 g/dL (ref 6.0–8.3)

## 2011-03-31 LAB — DIFFERENTIAL
Basophils Absolute: 0.1 10*3/uL (ref 0.0–0.1)
Basophils Relative: 2 % — ABNORMAL HIGH (ref 0–1)
Eosinophils Absolute: 0.1 10*3/uL (ref 0.0–0.7)
Eosinophils Relative: 1 % (ref 0–5)

## 2011-03-31 LAB — LIPASE, BLOOD: Lipase: 68 U/L — ABNORMAL HIGH (ref 11–59)

## 2011-03-31 LAB — RAPID URINE DRUG SCREEN, HOSP PERFORMED
Barbiturates: NOT DETECTED
Benzodiazepines: NOT DETECTED
Cocaine: POSITIVE — AB
Tetrahydrocannabinol: POSITIVE — AB

## 2011-03-31 LAB — URINALYSIS, ROUTINE W REFLEX MICROSCOPIC
Specific Gravity, Urine: 1.02 (ref 1.005–1.030)
Urobilinogen, UA: 0.2 mg/dL (ref 0.0–1.0)

## 2011-03-31 NOTE — ED Notes (Signed)
Pt c/o chest pain radiating into her back x 1 week. Pt also c/o nausea and vomiting today. Pt states that she has had 40 oz of beer. Pt also c/o blood tinged green sputum and coughing.

## 2011-03-31 NOTE — ED Provider Notes (Signed)
History  Scribed for Laray Anger, DO, the patient was seen in [facility]. The chart was scribed by Gilman Schmidt. The patients care was started at 5:56 PM.   CSN: [id number] Arrival date & time: 03/31/2011  5:20 PM    Chief Complaint  Patient presents with  . Chest Pain    The history is provided by the patient. History Limited By: intoxication.  Pt was seen at 1755.   Molly Campbell is a 50 y.o. female who presents to the Emergency Department complaining of gradual onset and persistence of constant chest pain x1 week. Pt was seen in the ED last week for similar symptoms.  Pt also c/o nausea and vomiting today.  Endorses has had "a 40 oz of beer" today.  Denies any other complaints.    PCP: Dr. Olena Leatherwood  Past Medical History  Diagnosis Date  . Hypertension   . Tobacco abuse   . Alcohol abuse   . Cocaine abuse   . 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 Auditory hallucinations   . Anxiety disorder   . Schizophrenia     Past Surgical History  Procedure Date  . Abdominal hysterectomy     Family History  Problem Relation Age of Onset  . Heart failure Other     Social History  . Marital Status: Widowed   Social History Main Topics  . Smoking status: Current Everyday Smoker -- 2.0 packs/day    Types: Cigarettes  . Smokeless tobacco: None  . Alcohol Use: Yes  . Drug Use: Yes    Special: Cocaine, Marijuana    OB History    Grav Para Term Preterm Abortions TAB SAB Ect Mult Living   3 3 3       3       Review of Systems  Unable to perform ROS: Other    Allergies  Review of patient's allergies indicates no known allergies.  Home Medications   Current Outpatient Rx  Name Route Sig Dispense Refill  . ALBUTEROL SULFATE HFA 108 (90 BASE) MCG/ACT IN AERS Inhalation Inhale 1 puff into the lungs 4 (four) times daily.      Marland Kitchen BENAZEPRIL-HYDROCHLOROTHIAZIDE 20-12.5 MG PO TABS Oral Take 1 tablet by mouth daily. FOR YOUR HIGH BLOOD PRESSURE. 30 tablet 2  . BENZONATATE  200 MG PO CAPS Oral Take 200 mg by mouth 3 (three) times daily as needed. For cough     . DOXYCYCLINE HYCLATE 100 MG PO CAPS Oral Take 100 mg by mouth 2 (two) times daily.      Marland Kitchen LORAZEPAM 1 MG PO TABS Oral Take 1 tablet (1 mg total) by mouth 3 (three) times daily. DO NOT TAKE THIS MEDICINE WITH ALCOHOL.    Marland Kitchen THERA M PLUS PO TABS Oral Take 1 tablet by mouth daily. 30 each   . OLANZAPINE 20 MG PO TABS Oral Take 1 tablet (20 mg total) by mouth daily. 30 tablet 1  . OMEPRAZOLE 40 MG PO CPDR Oral Take 1 capsule (40 mg total) by mouth daily. FOR ACID REFLUX. 30 capsule 1  . RALOXIFENE HCL 60 MG PO TABS Oral Take 60 mg by mouth daily.      . THIAMINE HCL 100 MG PO TABS Oral Take 1 tablet (100 mg total) by mouth daily. B-VITAMIN      BP 131/78  Pulse 72  Temp(Src) 98 F (36.7 C) (Oral)  Resp 20  Ht 5\' 2"  (1.575 m)  Wt 105 lb (47.628 kg)  BMI 19.20 kg/m2  SpO2 100%  Physical Exam 1800: Physical examination:  Nursing notes reviewed; Vital signs and O2 SAT reviewed;  Constitutional: Well developed, Well nourished, Well hydrated, In no acute distress; Head:  Normocephalic, atraumatic; Eyes: EOMI, PERRL, No scleral icterus; ENMT: Mouth and pharynx normal, Mucous membranes moist; Neck: Supple, Full range of motion, No lymphadenopathy; Cardiovascular: Regular rate and rhythm, No murmur, rub, or gallop; Respiratory: Breath sounds clear & equal bilaterally, No rales, rhonchi, wheezes, or rub, Normal respiratory effort/excursion; Chest: Nontender, Movement normal; Abdomen: Soft, Nontender, Nondistended, Normal bowel sounds; Extremities: Pulses normal, No tenderness, No edema, No calf edema or asymmetry.; Neuro: Awake/alert, +intoxicated. Major CN grossly intact. No facial droop, speech slurred.  Moves all ext well on stretcher..; Skin: Color normal, Warm, Dry.    ED Course  Procedures   MDM  MDM Reviewed: previous chart, nursing note and vitals Reviewed previous: ECG and labs Interpretation: ECG, labs  and x-ray    Date: 03/31/2011  Rate: 89  Rhythm: normal sinus rhythm  QRS Axis: normal  Intervals: normal  ST/T Wave abnormalities: normal  Conduction Disutrbances:none  Narrative Interpretation:   Old EKG Reviewed: unchanged; no significant changes from previous EKG dated 03/15/2011.  Results for orders placed during the hospital encounter of 03/31/11  CBC      Component Value Range   WBC 5.3  4.0 - 10.5 (K/uL)   RBC 3.78 (*) 3.87 - 5.11 (MIL/uL)   Hemoglobin 13.0  12.0 - 15.0 (g/dL)   HCT 16.1  09.6 - 04.5 (%)   MCV 97.9  78.0 - 100.0 (fL)   MCH 34.4 (*) 26.0 - 34.0 (pg)   MCHC 35.1  30.0 - 36.0 (g/dL)   RDW 40.9  81.1 - 91.4 (%)   Platelets 505 (*) 150 - 400 (K/uL)  DIFFERENTIAL      Component Value Range   Neutrophils Relative 25 (*) 43 - 77 (%)   Neutro Abs 1.3 (*) 1.7 - 7.7 (K/uL)   Lymphocytes Relative 67 (*) 12 - 46 (%)   Lymphs Abs 3.5  0.7 - 4.0 (K/uL)   Monocytes Relative 5  3 - 12 (%)   Monocytes Absolute 0.3  0.1 - 1.0 (K/uL)   Eosinophils Relative 1  0 - 5 (%)   Eosinophils Absolute 0.1  0.0 - 0.7 (K/uL)   Basophils Relative 2 (*) 0 - 1 (%)   Basophils Absolute 0.1  0.0 - 0.1 (K/uL)  LIPASE, BLOOD      Component Value Range   Lipase 68 (*) 11 - 59 (U/L)  COMPREHENSIVE METABOLIC PANEL      Component Value Range   Sodium 139  135 - 145 (mEq/L)   Potassium 3.4 (*) 3.5 - 5.1 (mEq/L)   Chloride 103  96 - 112 (mEq/L)   CO2 27  19 - 32 (mEq/L)   Glucose, Bld 85  70 - 99 (mg/dL)   BUN 13  6 - 23 (mg/dL)   Creatinine, Ser 7.82  0.50 - 1.10 (mg/dL)   Calcium 8.8  8.4 - 95.6 (mg/dL)   Total Protein 7.8  6.0 - 8.3 (g/dL)   Albumin 3.8  3.5 - 5.2 (g/dL)   AST 213 (*) 0 - 37 (U/L)   ALT 203 (*) 0 - 35 (U/L)   Alkaline Phosphatase 74  39 - 117 (U/L)   Total Bilirubin 0.3  0.3 - 1.2 (mg/dL)   GFR calc non Af Amer 80 (*) >90 (mL/min)   GFR calc Af Amer >90  >90 (mL/min)  URINALYSIS, ROUTINE W REFLEX MICROSCOPIC      Component Value Range   Color, Urine YELLOW   YELLOW    Appearance CLEAR  CLEAR    Specific Gravity, Urine 1.020  1.005 - 1.030    pH 5.5  5.0 - 8.0    Glucose, UA NEGATIVE  NEGATIVE (mg/dL)   Hgb urine dipstick TRACE (*) NEGATIVE    Bilirubin Urine NEGATIVE  NEGATIVE    Ketones, ur NEGATIVE  NEGATIVE (mg/dL)   Protein, ur NEGATIVE  NEGATIVE (mg/dL)   Urobilinogen, UA 0.2  0.0 - 1.0 (mg/dL)   Nitrite NEGATIVE  NEGATIVE    Leukocytes, UA NEGATIVE  NEGATIVE   ETHANOL      Component Value Range   Alcohol, Ethyl (B) 278 (*) 0 - 11 (mg/dL)  URINE RAPID DRUG SCREEN (HOSP PERFORMED)      Component Value Range   Opiates NONE DETECTED  NONE DETECTED    Cocaine POSITIVE (*) NONE DETECTED    Benzodiazepines NONE DETECTED  NONE DETECTED    Amphetamines NONE DETECTED  NONE DETECTED    Tetrahydrocannabinol POSITIVE (*) NONE DETECTED    Barbiturates NONE DETECTED  NONE DETECTED   URINE MICROSCOPIC-ADD ON      Component Value Range   Squamous Epithelial / LPF RARE  RARE    WBC, UA 0-2  <3 (WBC/hpf)   RBC / HPF 0-2  <3 (RBC/hpf)   Bacteria, UA RARE  RARE      Dg Chest 2 View  03/31/2011  *RADIOLOGY REPORT*  Clinical Data: Confusion, chest pain, shortness of breath, body aches  CHEST - 2 VIEW  Comparison: 03/14/2011  Findings: Normal heart size, mediastinal contours, and pulmonary vascularity. Lungs appear emphysematous but clear. No pleural effusion, pneumothorax or acute bony abnormality. Diffuse osseous demineralization noted.  IMPRESSION: Emphysematous changes. No acute abnormality.  Original Report Authenticated By: Lollie Marrow, M.D.    11:04 PM:  LFT's elevated, but has hx alcoholic hepatitis.  Lipase mildly elevated, no abd pain on exam.  Trop and EKG normal today.  Pt has been sleeping or watching TV most of ED visit.  Has tol PO well without N/V.  States she "don't have a ride" to get home, and covers her head up with the blanket and lays on her left side.  Will continue to observe in ED until clinical sobriety.   11:55 PM:  Pt  sleeping, VSS, NAD.  Sign out to Dr. Karma Ganja.      Chau Sawin Allison Quarry, DO 04/01/11 0001

## 2011-04-01 NOTE — ED Provider Notes (Addendum)
Pt reassessed, she is awake, sober and is ambulating without difficulty.  Has had breakfast.  Discharged with strict return precautions.    Ethelda Chick, MD 04/01/11 1308  Ethelda Chick, MD 04/01/11 [phone]

## 2011-04-01 NOTE — Discharge Instructions (Signed)
Return to the ED with any concerns including difficulty breathing, vomiting, or any other alarming symptoms.

## 2011-04-01 NOTE — ED Notes (Signed)
Patient given sandwich and coffee and cereal to eat. She states she was going to walk home due to her not having money for a cab

## 2011-04-01 NOTE — ED Notes (Signed)
Patient remains asleep. Will discharge patient in am when she is able to find a ride home.

## 2011-04-02 LAB — URINE CULTURE: Culture: NO GROWTH

## 2011-07-28 ENCOUNTER — Other Ambulatory Visit (HOSPITAL_COMMUNITY): Payer: Self-pay | Admitting: Internal Medicine

## 2011-07-28 DIAGNOSIS — Z139 Encounter for screening, unspecified: Secondary | ICD-10-CM

## 2011-09-01 ENCOUNTER — Ambulatory Visit (HOSPITAL_COMMUNITY): Payer: Medicaid Other

## 2011-10-11 ENCOUNTER — Emergency Department (HOSPITAL_COMMUNITY)
Admission: EM | Admit: 2011-10-11 | Discharge: 2011-10-11 | Discharge status: Against Medical Advice | Payer: Medicaid Other | Attending: Emergency Medicine | Admitting: Emergency Medicine

## 2011-10-11 ENCOUNTER — Encounter (HOSPITAL_COMMUNITY): Payer: Self-pay | Admitting: *Deleted

## 2011-10-11 DIAGNOSIS — F17200 Nicotine dependence, unspecified, uncomplicated: Secondary | ICD-10-CM | POA: Insufficient documentation

## 2011-10-11 DIAGNOSIS — F209 Schizophrenia, unspecified: Secondary | ICD-10-CM | POA: Insufficient documentation

## 2011-10-11 DIAGNOSIS — I10 Essential (primary) hypertension: Secondary | ICD-10-CM | POA: Insufficient documentation

## 2011-10-11 DIAGNOSIS — R040 Epistaxis: Secondary | ICD-10-CM | POA: Insufficient documentation

## 2011-10-11 NOTE — ED Provider Notes (Signed)
History     CSN: [id number]  Arrival date & time 10/11/11  1924   First MD Initiated Contact with Patient 10/11/11 1938      Chief Complaint  Patient presents with  . Epistaxis    (Consider location/radiation/quality/duration/timing/severity/associated sxs/prior treatment) HPI  Past Medical History  Diagnosis Date  . Hypertension   . Tobacco abuse   . Alcohol abuse   . Cocaine abuse   . 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 Auditory hallucinations   . Anxiety disorder   . Schizophrenia     Past Surgical History  Procedure Date  . Abdominal hysterectomy     Family History  Problem Relation Age of Onset  . Heart failure Other     History  Substance Use Topics  . Smoking status: Current Everyday Smoker -- 2.0 packs/day    Types: Cigarettes  . Smokeless tobacco: Not on file  . Alcohol Use: Yes    OB History    Grav Para Term Preterm Abortions TAB SAB Ect Mult Living   3 3 3       3       Review of Systems  Allergies  Review of patient's allergies indicates no known allergies.  Home Medications   Current Outpatient Rx  Name Route Sig Dispense Refill  . ALBUTEROL SULFATE HFA 108 (90 BASE) MCG/ACT IN AERS Inhalation Inhale 1 puff into the lungs 4 (four) times daily.      Marland Kitchen BENAZEPRIL-HYDROCHLOROTHIAZIDE 20-12.5 MG PO TABS Oral Take 1 tablet by mouth daily. FOR YOUR HIGH BLOOD PRESSURE. 30 tablet 2  . BENZONATATE 200 MG PO CAPS Oral Take 200 mg by mouth 3 (three) times daily as needed. For cough     . DOXYCYCLINE HYCLATE 100 MG PO CAPS Oral Take 100 mg by mouth 2 (two) times daily.      Marland Kitchen LORAZEPAM 1 MG PO TABS Oral Take 1 tablet (1 mg total) by mouth 3 (three) times daily. DO NOT TAKE THIS MEDICINE WITH ALCOHOL.    Marland Kitchen THERA M PLUS PO TABS Oral Take 1 tablet by mouth daily. 30 each   . OLANZAPINE 20 MG PO TABS Oral Take 1 tablet (20 mg total) by mouth daily. 30 tablet 1  . OMEPRAZOLE 40 MG PO CPDR Oral Take 1 capsule (40 mg total) by mouth daily. FOR ACID REFLUX.  30 capsule 1  . RALOXIFENE HCL 60 MG PO TABS Oral Take 60 mg by mouth daily.      . THIAMINE HCL 100 MG PO TABS Oral Take 1 tablet (100 mg total) by mouth daily. B-VITAMIN      BP 143/110  Pulse 85  Temp(Src) 98 F (36.7 C) (Oral)  Resp 20  Ht 5\' 2"  (1.575 m)  Wt 100 lb (45.36 kg)  BMI 18.29 kg/m2  SpO2 99%  Physical Exam  ED Course  Procedures (including critical care time)  Labs Reviewed - No data to display No results found.   1. Left against medical advice          Carleene Cooper III, MD 10/11/11 2147

## 2011-10-11 NOTE — ED Notes (Signed)
Pt walking down hall stating, " Im leaving" pt refuses to sign self out.

## 2011-10-11 NOTE — ED Notes (Signed)
C/o problems with eyesight, states it has been coming and going

## 2011-10-11 NOTE — ED Notes (Signed)
Nosebleed prior to arrival, has not taken her bp meds in over 2 weeks, patient has a dry hacking cough

## 2011-10-11 NOTE — ED Notes (Signed)
Nose not currently bleeding at this time.

## 2012-01-07 ENCOUNTER — Encounter (HOSPITAL_COMMUNITY): Payer: Self-pay

## 2012-01-07 ENCOUNTER — Emergency Department (HOSPITAL_COMMUNITY)
Admission: EM | Admit: 2012-01-07 | Discharge: 2012-01-07 | Disposition: A | Discharge status: Home / Self Care | Payer: Medicaid Other | Attending: Emergency Medicine | Admitting: Emergency Medicine

## 2012-01-07 ENCOUNTER — Emergency Department (HOSPITAL_COMMUNITY): Payer: Medicaid Other

## 2012-01-07 DIAGNOSIS — I10 Essential (primary) hypertension: Secondary | ICD-10-CM | POA: Insufficient documentation

## 2012-01-07 DIAGNOSIS — F411 Generalized anxiety disorder: Secondary | ICD-10-CM | POA: Insufficient documentation

## 2012-01-07 DIAGNOSIS — Z79899 Other long term (current) drug therapy: Secondary | ICD-10-CM | POA: Insufficient documentation

## 2012-01-07 DIAGNOSIS — Z8659 Personal history of other mental and behavioral disorders: Secondary | ICD-10-CM | POA: Insufficient documentation

## 2012-01-07 DIAGNOSIS — K2970 Gastritis, unspecified, without bleeding: Secondary | ICD-10-CM | POA: Insufficient documentation

## 2012-01-07 DIAGNOSIS — K2990 Gastroduodenitis, unspecified, without bleeding: Secondary | ICD-10-CM | POA: Insufficient documentation

## 2012-01-07 DIAGNOSIS — F17200 Nicotine dependence, unspecified, uncomplicated: Secondary | ICD-10-CM | POA: Insufficient documentation

## 2012-01-07 LAB — CBC WITH DIFFERENTIAL/PLATELET
Basophils Absolute: 0.1 10*3/uL (ref 0.0–0.1)
Basophils Relative: 2 % — ABNORMAL HIGH (ref 0–1)
Eosinophils Relative: 1 % (ref 0–5)
HCT: 36.1 % (ref 36.0–46.0)
MCHC: 36 g/dL (ref 30.0–36.0)
Monocytes Absolute: 0.3 10*3/uL (ref 0.1–1.0)
Neutro Abs: 1.5 10*3/uL — ABNORMAL LOW (ref 1.7–7.7)
Platelets: 408 10*3/uL — ABNORMAL HIGH (ref 150–400)
RDW: 12.9 % (ref 11.5–15.5)

## 2012-01-07 LAB — COMPREHENSIVE METABOLIC PANEL
AST: 71 U/L — ABNORMAL HIGH (ref 0–37)
Albumin: 3.7 g/dL (ref 3.5–5.2)
Calcium: 8.7 mg/dL (ref 8.4–10.5)
Chloride: 99 mEq/L (ref 96–112)
Creatinine, Ser: 0.75 mg/dL (ref 0.50–1.10)
Sodium: 135 mEq/L (ref 135–145)

## 2012-01-07 LAB — ETHANOL: Alcohol, Ethyl (B): 319 mg/dL — ABNORMAL HIGH (ref 0–11)

## 2012-01-07 MED ORDER — PANTOPRAZOLE SODIUM 40 MG IV SOLR
40.0000 mg | Freq: Once | INTRAVENOUS | Status: AC
  Administered 2012-01-07: 40 mg via INTRAVENOUS
  Filled 2012-01-07: qty 40

## 2012-01-07 MED ORDER — SODIUM CHLORIDE 0.9 % IV SOLN
Freq: Once | INTRAVENOUS | Status: AC
  Administered 2012-01-07: 22:00:00 via INTRAVENOUS

## 2012-01-07 MED ORDER — RANITIDINE HCL 150 MG PO CAPS: 150.0000 mg | ORAL_CAPSULE | Freq: Two times a day (BID) | ORAL | Status: DC

## 2012-01-07 NOTE — ED Provider Notes (Signed)
History   Scribed for Molly Lennert, MD, the patient was seen in room APA14/APA14 . This chart was scribed by Lewanda Rife.   CSN: [id number]  Arrival date & time 01/07/12  2018   First MD Initiated Contact with Patient 01/07/12 2141      Chief Complaint  Patient presents with  . Abdominal Pain    (Consider location/radiation/quality/duration/timing/severity/associated sxs/prior Treatment )Molly Campbell is a 51 y.o. female who presents to the Emergency Department complaining of mid epigastric pain since yesterday.  Patient is a 51 y.o. female presenting with abdominal pain. The history is provided by the patient.  Abdominal Pain The primary symptoms of the illness include abdominal pain. The primary symptoms of the illness do not include fatigue, nausea, vomiting or diarrhea. The current episode started 1 to 2 hours ago. The onset of the illness was gradual. The problem has not changed since onset. The illness is associated with alcohol use. Symptoms associated with the illness do not include hematuria, frequency or back pain.    Past Medical History  Diagnosis Date  . Hypertension   . Tobacco abuse   . Alcohol abuse   . Cocaine abuse   . 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 Auditory hallucinations   . Anxiety disorder   . Schizophrenia     Past Surgical History  Procedure Date  . Abdominal hysterectomy   . Breast biopsy     Family History  Problem Relation Age of Onset  . Heart failure Other     History  Substance Use Topics  . Smoking status: Current Everyday Smoker -- 2.0 packs/day    Types: Cigarettes  . Smokeless tobacco: Not on file  . Alcohol Use: Yes    OB History    Grav Para Term Preterm Abortions TAB SAB Ect Mult Living   3 3 3       3       Review of Systems  Constitutional: Negative for fatigue.  HENT: Negative for congestion, sinus pressure and ear discharge.   Eyes: Negative for discharge.  Respiratory: Negative for cough.     Cardiovascular: Negative for chest pain.  Gastrointestinal: Positive for abdominal pain. Negative for nausea, vomiting and diarrhea.  Genitourinary: Negative for frequency and hematuria.  Musculoskeletal: Negative for back pain.  Skin: Negative for rash.  Neurological: Negative for seizures and headaches.  Hematological: Negative.   Psychiatric/Behavioral: Negative for hallucinations.  All other systems reviewed and are negative.    Allergies  Review of patient's allergies indicates no known allergies.  Home Medications   Current Outpatient Rx  Name Route Sig Dispense Refill  . BENAZEPRIL-HYDROCHLOROTHIAZIDE 20-12.5 MG PO TABS Oral Take 1 tablet by mouth daily. FOR YOUR HIGH BLOOD PRESSURE. 30 tablet 2  . ALBUTEROL SULFATE HFA 108 (90 BASE) MCG/ACT IN AERS Inhalation Inhale 1 puff into the lungs 4 (four) times daily.      Marland Kitchen BENZONATATE 200 MG PO CAPS Oral Take 200 mg by mouth 3 (three) times daily as needed. For cough     . DOXYCYCLINE HYCLATE 100 MG PO CAPS Oral Take 100 mg by mouth 2 (two) times daily.      Marland Kitchen LORAZEPAM 1 MG PO TABS Oral Take 1 tablet (1 mg total) by mouth 3 (three) times daily. DO NOT TAKE THIS MEDICINE WITH ALCOHOL.    Marland Kitchen THERA M PLUS PO TABS Oral Take 1 tablet by mouth daily. 30 each   . OLANZAPINE 20 MG PO TABS Oral Take 1 tablet (20  mg total) by mouth daily. 30 tablet 1  . OMEPRAZOLE 40 MG PO CPDR Oral Take 1 capsule (40 mg total) by mouth daily. FOR ACID REFLUX. 30 capsule 1  . RALOXIFENE HCL 60 MG PO TABS Oral Take 60 mg by mouth daily.      . THIAMINE HCL 100 MG PO TABS Oral Take 1 tablet (100 mg total) by mouth daily. B-VITAMIN      BP 107/85  Pulse 74  Temp 98.3 F (36.8 C)  Resp 20  Ht 5\' 2"  (1.575 m)  Wt 100 lb (45.36 kg)  BMI 18.29 kg/m2  SpO2 100%  Physical Exam  Nursing note and vitals reviewed. Constitutional: She is oriented to person, place, and time. She appears well-developed.       Breath smells of Ethanol on exam   HENT:  Head:  Normocephalic and atraumatic.  Eyes: Conjunctivae and EOM are normal. No scleral icterus.  Neck: Neck supple. No thyromegaly present.  Cardiovascular: Normal rate and regular rhythm.  Exam reveals no gallop and no friction rub.   No murmur heard. Pulmonary/Chest: Effort normal. No stridor. She has no wheezes. She has no rales. She exhibits no tenderness.  Abdominal: Soft. She exhibits no distension. There is tenderness (midepigastric tenderness on exam ). There is no rebound.  Musculoskeletal: Normal range of motion. She exhibits no edema.  Lymphadenopathy:    She has no cervical adenopathy.  Neurological: She is oriented to person, place, and time. Coordination normal.  Skin: No rash noted. No erythema.  Psychiatric: She has a normal mood and affect. Her behavior is normal.    ED Course  Procedures (including critical care time)   Labs Reviewed  CBC WITH DIFFERENTIAL  COMPREHENSIVE METABOLIC PANEL  LIPASE, BLOOD  ETHANOL   No results found.   No diagnosis found.    MDM       The chart was scribed for me under my direct supervision.  I personally performed the history, physical, and medical decision making and all procedures in the evaluation of this patient.Molly Lennert, MD 01/07/12 [phone]

## 2012-01-07 NOTE — ED Notes (Signed)
Discharge instructions given and reviewed with patient.  Prescription given for Zantac; effects and use explained.  Patient verbalized understanding to take medication as directed and to follow up with her doctor this week.  Patient has family member to drive her home.

## 2012-01-07 NOTE — ED Notes (Addendum)
Pt sts R side pain.  Began yesterday.  Pt also sts dizziness and numbness in legs.

## 2012-02-15 ENCOUNTER — Ambulatory Visit (HOSPITAL_COMMUNITY)
Admission: RE | Admit: 2012-02-15 | Discharge: 2012-02-15 | Disposition: A | Discharge status: Home / Self Care | Payer: Medicaid Other | Source: Ambulatory Visit | Attending: Internal Medicine | Admitting: Internal Medicine

## 2012-02-15 DIAGNOSIS — Z139 Encounter for screening, unspecified: Secondary | ICD-10-CM

## 2012-02-15 DIAGNOSIS — Z1231 Encounter for screening mammogram for malignant neoplasm of breast: Secondary | ICD-10-CM | POA: Insufficient documentation

## 2012-04-11 ENCOUNTER — Emergency Department (HOSPITAL_COMMUNITY)
Admission: EM | Admit: 2012-04-11 | Discharge: 2012-04-12 | Disposition: A | Discharge status: Home / Self Care | Payer: Medicaid Other | Attending: Emergency Medicine | Admitting: Emergency Medicine

## 2012-04-11 ENCOUNTER — Emergency Department (HOSPITAL_COMMUNITY): Payer: Medicaid Other

## 2012-04-11 ENCOUNTER — Other Ambulatory Visit: Payer: Self-pay

## 2012-04-11 ENCOUNTER — Encounter (HOSPITAL_COMMUNITY): Payer: Self-pay | Admitting: *Deleted

## 2012-04-11 DIAGNOSIS — K7010 Alcoholic hepatitis without ascites: Secondary | ICD-10-CM | POA: Insufficient documentation

## 2012-04-11 DIAGNOSIS — F1410 Cocaine abuse, uncomplicated: Secondary | ICD-10-CM | POA: Insufficient documentation

## 2012-04-11 DIAGNOSIS — Z79899 Other long term (current) drug therapy: Secondary | ICD-10-CM | POA: Insufficient documentation

## 2012-04-11 DIAGNOSIS — F17200 Nicotine dependence, unspecified, uncomplicated: Secondary | ICD-10-CM | POA: Insufficient documentation

## 2012-04-11 DIAGNOSIS — I10 Essential (primary) hypertension: Secondary | ICD-10-CM | POA: Insufficient documentation

## 2012-04-11 DIAGNOSIS — R443 Hallucinations, unspecified: Secondary | ICD-10-CM | POA: Insufficient documentation

## 2012-04-11 DIAGNOSIS — F209 Schizophrenia, unspecified: Secondary | ICD-10-CM | POA: Insufficient documentation

## 2012-04-11 DIAGNOSIS — F1010 Alcohol abuse, uncomplicated: Secondary | ICD-10-CM | POA: Insufficient documentation

## 2012-04-11 DIAGNOSIS — J4489 Other specified chronic obstructive pulmonary disease: Secondary | ICD-10-CM | POA: Insufficient documentation

## 2012-04-11 DIAGNOSIS — J449 Chronic obstructive pulmonary disease, unspecified: Secondary | ICD-10-CM

## 2012-04-11 DIAGNOSIS — Z9889 Other specified postprocedural states: Secondary | ICD-10-CM | POA: Insufficient documentation

## 2012-04-11 DIAGNOSIS — F411 Generalized anxiety disorder: Secondary | ICD-10-CM | POA: Insufficient documentation

## 2012-04-11 MED ORDER — ALBUTEROL SULFATE (5 MG/ML) 0.5% IN NEBU
5.0000 mg | INHALATION_SOLUTION | Freq: Once | RESPIRATORY_TRACT | Status: AC
  Administered 2012-04-11: 5 mg via RESPIRATORY_TRACT
  Filled 2012-04-11: qty 1

## 2012-04-11 MED ORDER — ALBUTEROL SULFATE (5 MG/ML) 0.5% IN NEBU: 2.5000 mg | INHALATION_SOLUTION | Freq: Once | RESPIRATORY_TRACT | Status: AC

## 2012-04-11 MED ORDER — SODIUM CHLORIDE 0.9 % IN NEBU
INHALATION_SOLUTION | RESPIRATORY_TRACT | Status: AC
  Administered 2012-04-11: 3 mL
  Filled 2012-04-11: qty 3

## 2012-04-11 MED ORDER — IPRATROPIUM BROMIDE 0.02 % IN SOLN: 0.2500 mg | Freq: Once | RESPIRATORY_TRACT | Status: AC

## 2012-04-11 MED ORDER — METHYLPREDNISOLONE SODIUM SUCC 125 MG IJ SOLR
125.0000 mg | Freq: Once | INTRAMUSCULAR | Status: AC
  Administered 2012-04-11: 125 mg via INTRAVENOUS
  Filled 2012-04-11: qty 2

## 2012-04-11 NOTE — ED Provider Notes (Signed)
History  This chart was scribed for Donnetta Hutching, MD by Manuela Schwartz, ED scribe. This patient was seen in room APA06/APA06 and the patient's care was started at 2228.   CSN: [id number]  Arrival date & time 04/11/12  2228   None     Chief Complaint  Patient presents with  . Shortness of Breath   Level 5 caveat to pt's intoxication  Patient is a 51 y.o. female presenting with shortness of breath. The history is provided by the patient. No language interpreter was used.  Shortness of Breath  The current episode started today. The problem occurs occasionally. The problem has been unchanged. The problem is mild. Nothing relieves the symptoms. Nothing aggravates the symptoms. Associated symptoms include shortness of breath. Pertinent negatives include no chest pain and no fever. She has received no recent medical care.   Molly Campbell is a 51 y.o. female who presents to the Emergency Department complaining of SOB this PM which began about 2 hours ago. She reportedly has been smoking crack and marijuana, and with ETOH today.   Past Medical History  Diagnosis Date  . Hypertension   . Tobacco abuse   . Alcohol abuse   . Cocaine abuse   . 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 Auditory hallucinations   . Anxiety disorder   . Schizophrenia     Past Surgical History  Procedure Date  . Abdominal hysterectomy   . Breast biopsy     Family History  Problem Relation Age of Onset  . Heart failure Other     History  Substance Use Topics  . Smoking status: Current Every Day Smoker -- 2.0 packs/day    Types: Cigarettes  . Smokeless tobacco: Not on file  . Alcohol Use: 1.2 oz/week    2 Cans of beer per week    OB History    Grav Para Term Preterm Abortions TAB SAB Ect Mult Living   3 3 3       3       Review of Systems  Constitutional: Negative for fever and chills.  Respiratory: Positive for shortness of breath.   Cardiovascular: Negative for chest pain.  Gastrointestinal: Negative  for nausea and vomiting.  Neurological: Negative for weakness.  All other systems reviewed and are negative.    Allergies  Review of patient's allergies indicates no known allergies.  Home Medications   Current Outpatient Rx  Name  Route  Sig  Dispense  Refill  . ALBUTEROL SULFATE HFA 108 (90 BASE) MCG/ACT IN AERS   Inhalation   Inhale 1 puff into the lungs 4 (four) times daily.           Marland Kitchen BENAZEPRIL-HYDROCHLOROTHIAZIDE 20-12.5 MG PO TABS   Oral   Take 1 tablet by mouth daily. FOR YOUR HIGH BLOOD PRESSURE.   30 tablet   2   . BENZONATATE 200 MG PO CAPS   Oral   Take 200 mg by mouth 3 (three) times daily as needed. For cough          . DOXYCYCLINE HYCLATE 100 MG PO CAPS   Oral   Take 100 mg by mouth 2 (two) times daily.           Marland Kitchen LORAZEPAM 1 MG PO TABS   Oral   Take 1 tablet (1 mg total) by mouth 3 (three) times daily. DO NOT TAKE THIS MEDICINE WITH ALCOHOL.         Marland Kitchen THERA M PLUS PO TABS  Oral   Take 1 tablet by mouth daily.   30 each      . OLANZAPINE 20 MG PO TABS   Oral   Take 1 tablet (20 mg total) by mouth daily.   30 tablet   1   . OMEPRAZOLE 40 MG PO CPDR   Oral   Take 1 capsule (40 mg total) by mouth daily. FOR ACID REFLUX.   30 capsule   1   . RALOXIFENE HCL 60 MG PO TABS   Oral   Take 60 mg by mouth daily.           Marland Kitchen RANITIDINE HCL 150 MG PO CAPS   Oral   Take 1 capsule (150 mg total) by mouth 2 (two) times daily.   60 capsule   0     Triage Vitals: BP 142/89  Pulse 92  Temp 98.5 F (36.9 C) (Oral)  Resp 22  Ht 5\' 2"  (1.575 m)  Wt 102 lb (46.267 kg)  BMI 18.66 kg/m2  SpO2 100%  Physical Exam  Nursing note and vitals reviewed. Constitutional: She is oriented to person, place, and time. She appears well-developed and well-nourished.       Pt intoxicated  HENT:  Head: Normocephalic and atraumatic.  Eyes: Conjunctivae normal and EOM are normal. Pupils are equal, round, and reactive to light.  Neck: Normal range of  motion. Neck supple.  Cardiovascular: Normal rate, regular rhythm and normal heart sounds.   Pulmonary/Chest: Effort normal and breath sounds normal. She has no wheezes.  Abdominal: Soft. Bowel sounds are normal.  Musculoskeletal: Normal range of motion.  Neurological: She is alert and oriented to person, place, and time.  Skin: Skin is warm and dry.  Psychiatric: She has a normal mood and affect.    ED Course  Procedures (including critical care time) DIAGNOSTIC STUDIES: Oxygen Saturation is 100% on room air, normal by my interpretation.    COORDINATION OF CARE: At 1115 PM Discussed treatment plan with patient which includes breathing treatment, solu-medrol, CXR . Patient agrees.   Labs Reviewed - No data to display No results found. Dg Chest Portable 1 View  04/11/2012  *RADIOLOGY REPORT*  Clinical Data: Shortness of breath.  PORTABLE CHEST - 1 VIEW  Comparison: 03/31/2011  Findings: Pulmonary hyperinflation again seen, consistent with COPD.  Heart size is normal.  Both lungs are clear.  No evidence of pneumothorax or pleural effusion.  IMPRESSION: COPD.  No acute findings.   Original Report Authenticated By: Myles Rosenthal, M.D.     No diagnosis found.   Date: 04/11/2012    22:27  Rate: 159  Rhythm: atrial fibrillation  QRS Axis: normal  Intervals: normal  ST/T Wave abnormalities: nonspecific ST changes  Conduction Disutrbances:none  Narrative Interpretation:   Old EKG Reviewed: none available    Date: 04/11/2012   22:45  Rate:99  Rhythm: normal sinus rhythm  QRS Axis: normal  Intervals: normal  ST/T Wave abnormalities: normal  Conduction Disutrbances:none  Narrative Interpretation:   Old EKG Reviewed: changes noted   MDM   Patient was initially in A. Fib.    Rhythm converted to normal sinus rhythm.     Feeling better after breathing treatment.    Hemodynamically stable at discharge    I personally performed the services described in this documentation, which was  scribed in my presence. The recorded information has been reviewed and is accurate.          Donnetta Hutching, MD 04/12/12 0200

## 2012-04-11 NOTE — ED Notes (Signed)
Smoked crack, marijuana and drank ETOH today.  In home w/kerosene heater.  Hx of emphysema.  Became SOB about 2200.

## 2012-04-12 MED ORDER — IPRATROPIUM BROMIDE 0.02 % IN SOLN
RESPIRATORY_TRACT | Status: AC
  Administered 2012-04-12: 0.5 mg
  Filled 2012-04-12: qty 2.5

## 2012-04-12 MED ORDER — PREDNISONE 20 MG PO TABS: 60.0000 mg | ORAL_TABLET | Freq: Every day | ORAL | Status: DC

## 2012-04-12 MED ORDER — ALBUTEROL SULFATE (5 MG/ML) 0.5% IN NEBU
INHALATION_SOLUTION | RESPIRATORY_TRACT | Status: AC
  Administered 2012-04-12: 2.5 mg
  Filled 2012-04-12: qty 0.5

## 2012-04-12 MED ORDER — ALBUTEROL SULFATE HFA 108 (90 BASE) MCG/ACT IN AERS: 1.0000 | INHALATION_SPRAY | Freq: Four times a day (QID) | RESPIRATORY_TRACT | Status: DC | PRN

## 2012-04-12 NOTE — ED Notes (Signed)
Pt stating her breathing is no better or worse than when she came in. Pt wanting something for her chronic pain at this time.

## 2012-05-04 ENCOUNTER — Encounter (HOSPITAL_COMMUNITY): Payer: Self-pay | Admitting: Emergency Medicine

## 2012-05-04 ENCOUNTER — Emergency Department (HOSPITAL_COMMUNITY)
Admission: EM | Admit: 2012-05-04 | Discharge: 2012-05-04 | Disposition: A | Discharge status: Home / Self Care | Payer: Medicaid Other | Attending: Emergency Medicine | Admitting: Emergency Medicine

## 2012-05-04 ENCOUNTER — Emergency Department (HOSPITAL_COMMUNITY): Payer: Medicaid Other

## 2012-05-04 DIAGNOSIS — F411 Generalized anxiety disorder: Secondary | ICD-10-CM | POA: Insufficient documentation

## 2012-05-04 DIAGNOSIS — F3289 Other specified depressive episodes: Secondary | ICD-10-CM | POA: Insufficient documentation

## 2012-05-04 DIAGNOSIS — R51 Headache: Secondary | ICD-10-CM | POA: Insufficient documentation

## 2012-05-04 DIAGNOSIS — F1010 Alcohol abuse, uncomplicated: Secondary | ICD-10-CM | POA: Insufficient documentation

## 2012-05-04 DIAGNOSIS — R05 Cough: Secondary | ICD-10-CM | POA: Insufficient documentation

## 2012-05-04 DIAGNOSIS — F17200 Nicotine dependence, unspecified, uncomplicated: Secondary | ICD-10-CM | POA: Insufficient documentation

## 2012-05-04 DIAGNOSIS — R079 Chest pain, unspecified: Secondary | ICD-10-CM | POA: Insufficient documentation

## 2012-05-04 DIAGNOSIS — F329 Major depressive disorder, single episode, unspecified: Secondary | ICD-10-CM | POA: Insufficient documentation

## 2012-05-04 DIAGNOSIS — F1410 Cocaine abuse, uncomplicated: Secondary | ICD-10-CM | POA: Insufficient documentation

## 2012-05-04 DIAGNOSIS — R059 Cough, unspecified: Secondary | ICD-10-CM | POA: Insufficient documentation

## 2012-05-04 DIAGNOSIS — J40 Bronchitis, not specified as acute or chronic: Secondary | ICD-10-CM | POA: Insufficient documentation

## 2012-05-04 DIAGNOSIS — F209 Schizophrenia, unspecified: Secondary | ICD-10-CM | POA: Insufficient documentation

## 2012-05-04 DIAGNOSIS — I10 Essential (primary) hypertension: Secondary | ICD-10-CM | POA: Insufficient documentation

## 2012-05-04 LAB — COMPREHENSIVE METABOLIC PANEL
AST: 60 U/L — ABNORMAL HIGH (ref 0–37)
BUN: 9 mg/dL (ref 6–23)
CO2: 24 mEq/L (ref 19–32)
Chloride: 101 mEq/L (ref 96–112)
Creatinine, Ser: 0.92 mg/dL (ref 0.50–1.10)
GFR calc non Af Amer: 70 mL/min — ABNORMAL LOW (ref >90)
Total Bilirubin: 0.2 mg/dL — ABNORMAL LOW (ref 0.3–1.2)

## 2012-05-04 LAB — CBC WITH DIFFERENTIAL/PLATELET
HCT: 37 % (ref 36.0–46.0)
Hemoglobin: 13 g/dL (ref 12.0–15.0)
Lymphocytes Relative: 54 % — ABNORMAL HIGH (ref 12–46)
Lymphs Abs: 3.4 10*3/uL (ref 0.7–4.0)
Monocytes Absolute: 0.4 10*3/uL (ref 0.1–1.0)
Monocytes Relative: 7 % (ref 3–12)
Neutro Abs: 2.2 10*3/uL (ref 1.7–7.7)
WBC: 6.3 10*3/uL (ref 4.0–10.5)

## 2012-05-04 LAB — TROPONIN I: Troponin I: <0.3 ng/mL (ref <0.30)

## 2012-05-04 MED ORDER — AMOXICILLIN 500 MG PO CAPS: 500.0000 mg | ORAL_CAPSULE | Freq: Three times a day (TID) | ORAL | Status: DC

## 2012-05-04 NOTE — ED Notes (Signed)
Patient transported to X-ray 

## 2012-05-04 NOTE — ED Notes (Signed)
Pt has drank 2 40oz beers today.

## 2012-05-04 NOTE — ED Notes (Signed)
Pt c/o headache, sob and cough x one week.

## 2012-05-04 NOTE — ED Provider Notes (Signed)
History     CSN: [id number]  Arrival date & time 05/04/12  1825   First MD Initiated Contact with Patient 05/04/12 1835      Chief Complaint  Patient presents with  . Headache  . Shortness of Breath  . Cough    (Consider location/radiation/quality/duration/timing/severity/associated sxs/prior treatment) Patient is a 51 y.o. female presenting with cough. The history is provided by the patient (pt complains of cough and chest pain). No language interpreter was used.  Cough This is a new problem. The current episode started 2 days ago. The problem occurs constantly. The problem has not changed since onset.The cough is non-productive. Maximum temperature: none. Associated symptoms include chest pain. Pertinent negatives include no headaches. She has tried nothing for the symptoms. The treatment provided moderate relief. She is not a smoker. Her past medical history does not include COPD.    Past Medical History  Diagnosis Date  . Hypertension   . Tobacco abuse   . Alcohol abuse   . Cocaine abuse   . 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 Auditory hallucinations   . Anxiety disorder   . Schizophrenia     Past Surgical History  Procedure Date  . Abdominal hysterectomy   . Breast biopsy     Family History  Problem Relation Age of Onset  . Heart failure Other     History  Substance Use Topics  . Smoking status: Current Every Day Smoker -- 2.0 packs/day    Types: Cigarettes  . Smokeless tobacco: Not on file  . Alcohol Use: 1.2 oz/week    2 Cans of beer per week    OB History    Grav Para Term Preterm Abortions TAB SAB Ect Mult Living   3 3 3       3       Review of Systems  Constitutional: Negative for fatigue.  HENT: Negative for congestion, sinus pressure and ear discharge.   Eyes: Negative for discharge.  Respiratory: Positive for cough.   Cardiovascular: Positive for chest pain.  Gastrointestinal: Negative for abdominal pain and diarrhea.  Genitourinary:  Negative for frequency and hematuria.  Musculoskeletal: Negative for back pain.  Skin: Negative for rash.  Neurological: Negative for seizures and headaches.  Hematological: Negative.   Psychiatric/Behavioral: Negative for hallucinations.    Allergies  Review of patient's allergies indicates no known allergies.  Home Medications   Current Outpatient Rx  Name  Route  Sig  Dispense  Refill  . ALBUTEROL SULFATE HFA 108 (90 BASE) MCG/ACT IN AERS   Inhalation   Inhale 1-2 puffs into the lungs every 6 (six) hours as needed for wheezing.   1 Inhaler   0   . ALBUTEROL SULFATE HFA 108 (90 BASE) MCG/ACT IN AERS   Inhalation   Inhale 1 puff into the lungs 4 (four) times daily.           Marland Kitchen BENAZEPRIL-HYDROCHLOROTHIAZIDE 20-12.5 MG PO TABS   Oral   Take 1 tablet by mouth daily. FOR YOUR HIGH BLOOD PRESSURE.   30 tablet   2   . BENZONATATE 200 MG PO CAPS   Oral   Take 200 mg by mouth 3 (three) times daily as needed. For cough          . DOXYCYCLINE HYCLATE 100 MG PO CAPS   Oral   Take 100 mg by mouth 2 (two) times daily.           Marland Kitchen LORAZEPAM 1 MG PO TABS   Oral  Take 1 tablet (1 mg total) by mouth 3 (three) times daily. DO NOT TAKE THIS MEDICINE WITH ALCOHOL.         Marland Kitchen THERA M PLUS PO TABS   Oral   Take 1 tablet by mouth daily.   30 each      . OLANZAPINE 20 MG PO TABS   Oral   Take 1 tablet (20 mg total) by mouth daily.   30 tablet   1   . OMEPRAZOLE 40 MG PO CPDR   Oral   Take 1 capsule (40 mg total) by mouth daily. FOR ACID REFLUX.   30 capsule   1   . PREDNISONE 20 MG PO TABS   Oral   Take 3 tablets (60 mg total) by mouth daily.   15 tablet   0   . RALOXIFENE HCL 60 MG PO TABS   Oral   Take 60 mg by mouth daily.           Marland Kitchen RANITIDINE HCL 150 MG PO CAPS   Oral   Take 1 capsule (150 mg total) by mouth 2 (two) times daily.   60 capsule   0     BP 118/97  Pulse 93  Temp 98.3 F (36.8 C) (Oral)  Resp 18  Ht 5\' 2"  (1.575 m)  Wt 105 lb  (47.628 kg)  BMI 19.20 kg/m2  SpO2 96%  Physical Exam  Constitutional: She is oriented to person, place, and time. She appears well-developed.  HENT:  Head: Normocephalic and atraumatic.  Eyes: Conjunctivae normal and EOM are normal. No scleral icterus.  Neck: Neck supple. No thyromegaly present.  Cardiovascular: Normal rate and regular rhythm.  Exam reveals no gallop and no friction rub.   No murmur heard. Pulmonary/Chest: No stridor. She has no wheezes. She has no rales. She exhibits no tenderness.  Abdominal: She exhibits no distension. There is no tenderness. There is no rebound.  Musculoskeletal: Normal range of motion. She exhibits no edema.  Lymphadenopathy:    She has no cervical adenopathy.  Neurological: She is oriented to person, place, and time. Coordination normal.  Skin: No rash noted. No erythema.  Psychiatric: She has a normal mood and affect. Her behavior is normal.    ED Course  Procedures (including critical care time)  Labs Reviewed  CBC WITH DIFFERENTIAL - Abnormal; Notable for the following:    RBC 3.75 (*)     MCH 34.7 (*)     Neutrophils Relative 35 (*)     Lymphocytes Relative 54 (*)     All other components within normal limits  COMPREHENSIVE METABOLIC PANEL - Abnormal; Notable for the following:    Potassium 3.0 (*)     Glucose, Bld 103 (*)     Albumin 3.3 (*)     AST 60 (*)     ALT 41 (*)     Total Bilirubin 0.2 (*)     GFR calc non Af Amer 70 (*)     GFR calc Af Amer 81 (*)     All other components within normal limits  ETHANOL - Abnormal; Notable for the following:    Alcohol, Ethyl (B) 161 (*)     All other components within normal limits  TROPONIN I   Dg Chest 2 View  05/04/2012  *RADIOLOGY REPORT*  Clinical Data: Headache and shortness of breath.  Cough, dizziness.  CHEST - 2 VIEW  Comparison: 04/11/2012  Findings: The lungs are hyperinflated.  Heart size is  normal. There are patchy interstitial changes at the bases bilaterally,  slightly increased since prior study.  Findings are consistent with viral illness or developing infiltrates and bronchitis.  No definite focal consolidations or pleural effusions identified. Visualized osseous structures have a normal appearance.  IMPRESSION:  1.  Increased interstitial markings suggesting viral illness or developing bilateral infiltrates. 2.  Bronchitic changes.   Original Report Authenticated By: Norva Pavlov, M.D.      1. Bronchitis   2. Alcohol abuse       Date: 05/04/2012  Rate105  Rhythm: sinus tachycardia  QRS Axis: normal  Intervals: normal  ST/T Wave abnormalities: nonspecific ST changes  Conduction Disutrbances:none  Narrative Interpretation:   Old EKG Reviewed: none available   MDM          [name], MD 05/04/12 2043

## 2012-05-04 NOTE — ED Notes (Signed)
Pt alert & oriented x4, stable gait. Patient given discharge instructions, paperwork & prescription(s). Patient  instructed to stop at the registration desk to finish any additional paperwork. Patient verbalized understanding. Pt left department w/ no further questions. 

## 2012-05-18 ENCOUNTER — Encounter (HOSPITAL_COMMUNITY): Payer: Self-pay | Admitting: *Deleted

## 2012-05-18 ENCOUNTER — Emergency Department (HOSPITAL_COMMUNITY)
Admission: EM | Admit: 2012-05-18 | Discharge: 2012-05-19 | Disposition: A | Discharge status: Home / Self Care | Payer: Medicaid Other | Attending: Emergency Medicine | Admitting: Emergency Medicine

## 2012-05-18 DIAGNOSIS — F411 Generalized anxiety disorder: Secondary | ICD-10-CM | POA: Insufficient documentation

## 2012-05-18 DIAGNOSIS — F17200 Nicotine dependence, unspecified, uncomplicated: Secondary | ICD-10-CM | POA: Insufficient documentation

## 2012-05-18 DIAGNOSIS — F1410 Cocaine abuse, uncomplicated: Secondary | ICD-10-CM | POA: Insufficient documentation

## 2012-05-18 DIAGNOSIS — F1010 Alcohol abuse, uncomplicated: Secondary | ICD-10-CM | POA: Insufficient documentation

## 2012-05-18 DIAGNOSIS — F10929 Alcohol use, unspecified with intoxication, unspecified: Secondary | ICD-10-CM

## 2012-05-18 DIAGNOSIS — R079 Chest pain, unspecified: Secondary | ICD-10-CM

## 2012-05-18 DIAGNOSIS — M549 Dorsalgia, unspecified: Secondary | ICD-10-CM | POA: Insufficient documentation

## 2012-05-18 DIAGNOSIS — Z8719 Personal history of other diseases of the digestive system: Secondary | ICD-10-CM | POA: Insufficient documentation

## 2012-05-18 DIAGNOSIS — F209 Schizophrenia, unspecified: Secondary | ICD-10-CM | POA: Insufficient documentation

## 2012-05-18 DIAGNOSIS — R51 Headache: Secondary | ICD-10-CM | POA: Insufficient documentation

## 2012-05-18 DIAGNOSIS — R0789 Other chest pain: Secondary | ICD-10-CM | POA: Insufficient documentation

## 2012-05-18 DIAGNOSIS — Z79899 Other long term (current) drug therapy: Secondary | ICD-10-CM | POA: Insufficient documentation

## 2012-05-18 DIAGNOSIS — I10 Essential (primary) hypertension: Secondary | ICD-10-CM | POA: Insufficient documentation

## 2012-05-18 NOTE — ED Notes (Signed)
Pt states she does not feel good and smells of ETOH.

## 2012-05-18 NOTE — ED Notes (Signed)
Pt c/o headache, back pain, and chest pain x 3 days.

## 2012-05-19 ENCOUNTER — Emergency Department (HOSPITAL_COMMUNITY): Payer: Medicaid Other

## 2012-05-19 LAB — COMPREHENSIVE METABOLIC PANEL
ALT: 111 U/L — ABNORMAL HIGH (ref 0–35)
AST: 140 U/L — ABNORMAL HIGH (ref 0–37)
Albumin: 3.7 g/dL (ref 3.5–5.2)
Calcium: 9 mg/dL (ref 8.4–10.5)
GFR calc Af Amer: >90 mL/min (ref >90)
Sodium: 139 mEq/L (ref 135–145)
Total Protein: 7.7 g/dL (ref 6.0–8.3)

## 2012-05-19 LAB — CBC WITH DIFFERENTIAL/PLATELET
Basophils Absolute: 0.1 10*3/uL (ref 0.0–0.1)
Basophils Relative: 1 % (ref 0–1)
Eosinophils Absolute: 0.1 10*3/uL (ref 0.0–0.7)
Eosinophils Relative: 2 % (ref 0–5)
MCH: 34.7 pg — ABNORMAL HIGH (ref 26.0–34.0)
MCV: 98.6 fL (ref 78.0–100.0)
Platelets: 482 10*3/uL — ABNORMAL HIGH (ref 150–400)
RDW: 13.2 % (ref 11.5–15.5)

## 2012-05-19 MED ORDER — ASPIRIN 81 MG PO CHEW
324.0000 mg | CHEWABLE_TABLET | Freq: Once | ORAL | Status: AC
  Administered 2012-05-19: 324 mg via ORAL
  Filled 2012-05-19: qty 4

## 2012-05-19 MED ORDER — ALBUTEROL SULFATE (5 MG/ML) 0.5% IN NEBU
5.0000 mg | INHALATION_SOLUTION | RESPIRATORY_TRACT | Status: DC
  Administered 2012-05-19 (×2): 5 mg via RESPIRATORY_TRACT
  Filled 2012-05-19 (×2): qty 1

## 2012-05-19 MED ORDER — SODIUM CHLORIDE 0.9 % IV BOLUS (SEPSIS)
1000.0000 mL | Freq: Once | INTRAVENOUS | Status: AC
  Administered 2012-05-19: 1000 mL via INTRAVENOUS

## 2012-05-19 MED ORDER — IPRATROPIUM BROMIDE 0.02 % IN SOLN
0.5000 mg | RESPIRATORY_TRACT | Status: DC
  Administered 2012-05-19 (×2): 0.5 mg via RESPIRATORY_TRACT
  Filled 2012-05-19 (×2): qty 2.5

## 2012-05-19 NOTE — ED Notes (Signed)
Pt's sister contacted to come pick pt up.

## 2012-05-19 NOTE — ED Provider Notes (Signed)
History     CSN: [id number]  Arrival date & time 05/18/12  2313   First MD Initiated Contact with Patient 05/18/12 2352      Chief Complaint  Patient presents with  . Headache  . Back Pain  . Chest Pain   Patient is a poor historian by virtue of being intoxicated with alcohol (Consider location/radiation/quality/duration/timing/severity/associated sxs/prior treatment) HPIJanice B Campbell is a 52 y.o. female history of tobacco use, COPD ethanol abuse as well as other substances, patient presents intoxicated complaining about chest pain. She says the chest pain is sharp it is all over her chest mostly on the left, also in the back, no nausea vomiting or diaphoresis, this started 3 days ago and has been constant.  No headache. She also complains about some right forearm pain - this is moderate to severe, sharp, worse on palpation, no prior injury. Pt is difficult to redirect and interview.    Past Medical History  Diagnosis Date  . Hypertension   . Tobacco abuse   . Alcohol abuse   . Cocaine abuse   . 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 Auditory hallucinations   . Anxiety disorder   . Schizophrenia     Past Surgical History  Procedure Date  . Abdominal hysterectomy   . Breast biopsy     Family History  Problem Relation Age of Onset  . Heart failure Other     History  Substance Use Topics  . Smoking status: Current Every Day Smoker -- 2.0 packs/day    Types: Cigarettes  . Smokeless tobacco: Not on file  . Alcohol Use: 1.2 oz/week    2 Cans of beer per week    OB History    Grav Para Term Preterm Abortions TAB SAB Ect Mult Living   3 3 3       3       Review of Systems At least 10pt or greater review of systems completed and are negative except where specified in the HPI.  Allergies  Review of patient's allergies indicates no known allergies.  Home Medications   Current Outpatient Rx  Name  Route  Sig  Dispense  Refill  . ALBUTEROL SULFATE HFA 108 (90 BASE)  MCG/ACT IN AERS   Inhalation   Inhale 1-2 puffs into the lungs every 6 (six) hours as needed for wheezing.   1 Inhaler   0   . ALBUTEROL SULFATE HFA 108 (90 BASE) MCG/ACT IN AERS   Inhalation   Inhale 1 puff into the lungs 4 (four) times daily.           . AMOXICILLIN 500 MG PO CAPS   Oral   Take 1 capsule (500 mg total) by mouth 3 (three) times daily.   21 capsule   0   . BENAZEPRIL-HYDROCHLOROTHIAZIDE 20-12.5 MG PO TABS   Oral   Take 1 tablet by mouth daily. FOR YOUR HIGH BLOOD PRESSURE.   30 tablet   2   . BENZONATATE 200 MG PO CAPS   Oral   Take 200 mg by mouth 3 (three) times daily as needed. For cough          . DOXYCYCLINE HYCLATE 100 MG PO CAPS   Oral   Take 100 mg by mouth 2 (two) times daily.           Marland Kitchen LORAZEPAM 1 MG PO TABS   Oral   Take 1 tablet (1 mg total) by mouth 3 (three) times daily. DO NOT TAKE  THIS MEDICINE WITH ALCOHOL.         Marland Kitchen THERA M PLUS PO TABS   Oral   Take 1 tablet by mouth daily.   30 each      . OLANZAPINE 20 MG PO TABS   Oral   Take 1 tablet (20 mg total) by mouth daily.   30 tablet   1   . OMEPRAZOLE 40 MG PO CPDR   Oral   Take 1 capsule (40 mg total) by mouth daily. FOR ACID REFLUX.   30 capsule   1   . PREDNISONE 20 MG PO TABS   Oral   Take 3 tablets (60 mg total) by mouth daily.   15 tablet   0   . RALOXIFENE HCL 60 MG PO TABS   Oral   Take 60 mg by mouth daily.           Marland Kitchen RANITIDINE HCL 150 MG PO CAPS   Oral   Take 1 capsule (150 mg total) by mouth 2 (two) times daily.   60 capsule   0     BP 135/90  Pulse 89  Temp 98.2 F (36.8 C) (Oral)  Resp 20  Ht 5\' 1"  (1.549 m)  Wt 96 lb (43.545 kg)  BMI 18.14 kg/m2  SpO2 96%  Physical Exam  Nursing notes reviewed.  Electronic medical record reviewed. VITAL SIGNS:   Filed Vitals:   05/18/12 2314  BP: 135/90  Pulse: 89  Temp: 98.2 F (36.8 C)  TempSrc: Oral  Resp: 20  Height: 5\' 1"  (1.549 m)  Weight: 96 lb (43.545 kg)  SpO2: 96%    CONSTITUTIONAL: Awake, alert, intoxicated, emotionally labile. No obvious signs of trauma. HENT: Atraumatic, normocephalic, oral mucosa pink and moist, airway patent. Nares patent without drainage. External ears normal. EYES: Conjunctiva clear, EOMI, PERRLA NECK: Trachea midline, non-tender, supple CARDIOVASCULAR: Normal heart rate, Normal rhythm, No murmurs, rubs, gallops PULMONARY/CHEST: Clear to auscultation, no rhonchi, wheezes, or rales. Symmetrical breath sounds. Patient is diffusely hyperesthetic to palpation across her chest abdomen arms and shoulders. ABDOMINAL: Non-distended, soft, non-tender - no rebound or guarding.  BS normal. NEUROLOGIC: Non-focal, moving all four extremities, no gross sensory or motor deficits. EXTREMITIES: No clubbing, cyanosis, or edema SKIN: Warm, Dry, No erythema, No rash  ED Course  Procedures (including critical care time)  Date: 05/19/2012  Rate: 83  Rhythm: normal sinus rhythm  QRS Axis: normal  Intervals: normal  ST/T Wave abnormalities: normal  Conduction Disutrbances: none  Narrative Interpretation: unremarkable no significant changes when compared with prior EKG no ischemia or infarction acutely   Labs Reviewed  CBC WITH DIFFERENTIAL - Abnormal; Notable for the following:    MCH 34.7 (*)     Platelets 482 (*)     Neutrophils Relative 24 (*)     Neutro Abs 1.6 (*)     Lymphocytes Relative 65 (*)     Lymphs Abs 4.1 (*)     All other components within normal limits  COMPREHENSIVE METABOLIC PANEL - Abnormal; Notable for the following:    Potassium 3.4 (*)     AST 140 (*)     ALT 111 (*)     Total Bilirubin 0.2 (*)     All other components within normal limits  TROPONIN I  LAB REPORT - SCANNED   Dg Chest 2 View  05/19/2012  *RADIOLOGY REPORT*  Clinical Data: Cough, chest pain.  CHEST - 2 VIEW  Comparison: 05/04/2012  Findings: Hyperinflation of  the lungs.  Chronic peribronchial thickening.  Heart is borderline in size.  No confluent  opacities or effusions.  No acute bony abnormality.  IMPRESSION: Hyperinflation.  Bronchitic changes.   Original Report Authenticated By: Charlett Nose, M.D.      1. Alcohol abuse   2. Cocaine abuse   3. Chest pain   4. Alcohol intoxication       MDM  Molly Campbell is a 52 y.o. female presenting with chest pain and alcohol intoxication. Chest pain is been going on x3 days - she is concerned as her COPD, she is a poor historian, I do not think this represents an acute coronary syndrome, I also do not think this is an COPD exacerbation. Patient is not tachypneic, she is in no respiratory distress, she does have some wheezes.  Pt evaluated and found to have no acute cardiopulmonary disease.  Pt sleeping.  Will DC pt when clinically sober.          Jones Skene, MD 05/21/12 [phone]

## 2012-05-19 NOTE — ED Notes (Signed)
Pt's family member here for pt.

## 2012-05-19 NOTE — ED Notes (Signed)
Discharge instructions reviewed with pt, questions answered. Pt verbalized understanding.  

## 2012-05-19 NOTE — ED Notes (Signed)
Unable to contact any emergency contacts for this pt at this time.

## 2012-05-19 NOTE — ED Notes (Signed)
Pt given meal tray.

## 2012-05-19 NOTE — ED Notes (Signed)
Patient is resting comfortably. 

## 2012-06-26 ENCOUNTER — Encounter (HOSPITAL_COMMUNITY): Payer: Self-pay | Admitting: *Deleted

## 2012-06-26 ENCOUNTER — Emergency Department (HOSPITAL_COMMUNITY): Payer: Medicaid Other

## 2012-06-26 ENCOUNTER — Emergency Department (HOSPITAL_COMMUNITY)
Admission: EM | Admit: 2012-06-26 | Discharge: 2012-06-26 | Disposition: A | Discharge status: Home / Self Care | Payer: Medicaid Other | Attending: Emergency Medicine | Admitting: Emergency Medicine

## 2012-06-26 DIAGNOSIS — F1410 Cocaine abuse, uncomplicated: Secondary | ICD-10-CM | POA: Insufficient documentation

## 2012-06-26 DIAGNOSIS — R61 Generalized hyperhidrosis: Secondary | ICD-10-CM | POA: Insufficient documentation

## 2012-06-26 DIAGNOSIS — Z8709 Personal history of other diseases of the respiratory system: Secondary | ICD-10-CM | POA: Insufficient documentation

## 2012-06-26 DIAGNOSIS — F209 Schizophrenia, unspecified: Secondary | ICD-10-CM | POA: Insufficient documentation

## 2012-06-26 DIAGNOSIS — F1210 Cannabis abuse, uncomplicated: Secondary | ICD-10-CM | POA: Insufficient documentation

## 2012-06-26 DIAGNOSIS — R220 Localized swelling, mass and lump, head: Secondary | ICD-10-CM

## 2012-06-26 DIAGNOSIS — Z8719 Personal history of other diseases of the digestive system: Secondary | ICD-10-CM | POA: Insufficient documentation

## 2012-06-26 DIAGNOSIS — I10 Essential (primary) hypertension: Secondary | ICD-10-CM | POA: Insufficient documentation

## 2012-06-26 DIAGNOSIS — J441 Chronic obstructive pulmonary disease with (acute) exacerbation: Secondary | ICD-10-CM | POA: Insufficient documentation

## 2012-06-26 DIAGNOSIS — F17200 Nicotine dependence, unspecified, uncomplicated: Secondary | ICD-10-CM | POA: Insufficient documentation

## 2012-06-26 DIAGNOSIS — R1110 Vomiting, unspecified: Secondary | ICD-10-CM | POA: Insufficient documentation

## 2012-06-26 DIAGNOSIS — F411 Generalized anxiety disorder: Secondary | ICD-10-CM | POA: Insufficient documentation

## 2012-06-26 DIAGNOSIS — R05 Cough: Secondary | ICD-10-CM | POA: Insufficient documentation

## 2012-06-26 DIAGNOSIS — Z79899 Other long term (current) drug therapy: Secondary | ICD-10-CM | POA: Insufficient documentation

## 2012-06-26 DIAGNOSIS — Z8659 Personal history of other mental and behavioral disorders: Secondary | ICD-10-CM | POA: Insufficient documentation

## 2012-06-26 DIAGNOSIS — R059 Cough, unspecified: Secondary | ICD-10-CM | POA: Insufficient documentation

## 2012-06-26 HISTORY — DX: Emphysema, unspecified: J43.9

## 2012-06-26 HISTORY — DX: Acute bronchitis, unspecified: J20.9

## 2012-06-26 HISTORY — DX: Chronic obstructive pulmonary disease, unspecified: J44.9

## 2012-06-26 HISTORY — DX: Unspecified chronic bronchitis: J42

## 2012-06-26 LAB — HEPATIC FUNCTION PANEL
ALT: 20 U/L (ref 0–35)
Bilirubin, Direct: 0.2 mg/dL (ref 0.0–0.3)
Indirect Bilirubin: 0.6 mg/dL (ref 0.3–0.9)
Total Protein: 8.7 g/dL — ABNORMAL HIGH (ref 6.0–8.3)

## 2012-06-26 LAB — URINALYSIS, ROUTINE W REFLEX MICROSCOPIC
Leukocytes, UA: NEGATIVE
Nitrite: NEGATIVE
Protein, ur: 30 mg/dL — AB
Specific Gravity, Urine: 1.02 (ref 1.005–1.030)
Urobilinogen, UA: 1 mg/dL (ref 0.0–1.0)

## 2012-06-26 LAB — URINE MICROSCOPIC-ADD ON

## 2012-06-26 LAB — BASIC METABOLIC PANEL
CO2: 24 mEq/L (ref 19–32)
Calcium: 10.2 mg/dL (ref 8.4–10.5)
Chloride: 98 mEq/L (ref 96–112)
Creatinine, Ser: 0.75 mg/dL (ref 0.50–1.10)
GFR calc Af Amer: >90 mL/min (ref >90)
Sodium: 136 mEq/L (ref 135–145)

## 2012-06-26 LAB — CBC WITH DIFFERENTIAL/PLATELET
Eosinophils Absolute: 0 10*3/uL (ref 0.0–0.7)
HCT: 44.6 % (ref 36.0–46.0)
Hemoglobin: 15.7 g/dL — ABNORMAL HIGH (ref 12.0–15.0)
Lymphs Abs: 2.6 10*3/uL (ref 0.7–4.0)
MCH: 34.3 pg — ABNORMAL HIGH (ref 26.0–34.0)
MCV: 97.4 fL (ref 78.0–100.0)
Monocytes Relative: 11 % (ref 3–12)
Neutrophils Relative %: 43 % (ref 43–77)
RBC: 4.58 MIL/uL (ref 3.87–5.11)

## 2012-06-26 LAB — RAPID URINE DRUG SCREEN, HOSP PERFORMED
Cocaine: POSITIVE — AB
Opiates: NOT DETECTED
Tetrahydrocannabinol: POSITIVE — AB

## 2012-06-26 LAB — TROPONIN I: Troponin I: <0.3 ng/mL (ref <0.30)

## 2012-06-26 NOTE — ED Notes (Signed)
Patient walked to restroom and back to room. Patient states she does not need anything at this time.

## 2012-06-26 NOTE — ED Notes (Signed)
Pt states CP and SOB began last night. Hx of COPD. Pain described as sharp, intermittent pain. Pain radiating to the back. NAD.

## 2012-06-26 NOTE — ED Provider Notes (Signed)
History     CSN: [id number]  Arrival date & time 06/26/12  1208   First MD Initiated Contact with Patient 06/26/12 1319      Chief Complaint  Patient presents with  . Chest Pain  . Shortness of Breath    (Consider location/radiation/quality/duration/timing/severity/associated sxs/prior treatment) Patient is a 52 y.o. female presenting with chest pain and shortness of breath. The history is provided by the patient and medical records. No language interpreter was used.  Chest Pain Pain location:  L lateral chest Pain quality comment:  Pt has had chest pain in the past, but it came on last night.  she feels a pain like pins and needles, felt in the left anterior chest with radiation to the left shoulder.   Pain radiates to:  L shoulder Pain radiates to the back: no   Pain severity:  Moderate Onset quality:  Sudden Duration:  2 minutes Timing:  Intermittent Progression:  Waxing and waning Chronicity:  Recurrent Context: breathing and drug use (she admits to cocaine abuse, saying she last used about a week ago. She also drinks alcohol, 2 40 ounce beers per day.)   Relieved by:  None tried Worsened by:  Nothing tried Associated symptoms: cough and diaphoresis   Associated symptoms: no fever and no shortness of breath  Vomiting: one episode of vomiting last night.   Risk factors: smoking   Shortness of Breath Associated symptoms: chest pain, cough and diaphoresis   Associated symptoms: no fever  Vomiting: one episode of vomiting last night.     Past Medical History  Diagnosis Date  . Hypertension   . Tobacco abuse   . Alcohol abuse   . Cocaine abuse   . 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 Auditory hallucinations   . Anxiety disorder   . Schizophrenia   . COPD (chronic obstructive pulmonary disease)   . Emphysema   . Chronic bronchitis with acute exacerbation     Past Surgical History  Procedure Laterality Date  . Abdominal hysterectomy    . Breast biopsy      Family  History  Problem Relation Age of Onset  . Heart failure Other     History  Substance Use Topics  . Smoking status: Current Every Day Smoker -- 2.00 packs/day    Types: Cigarettes  . Smokeless tobacco: Not on file  . Alcohol Use: 1.2 oz/week    2 Cans of beer per week    OB History   Grav Para Term Preterm Abortions TAB SAB Ect Mult Living   3 3 3       3       Review of Systems  Constitutional: Positive for diaphoresis. Negative for fever.  HENT:       She has a lesion mucosal surface of her left cheek. It's been there for 4 months.  Eyes: Negative.   Respiratory: Positive for cough. Negative for shortness of breath.   Cardiovascular: Positive for chest pain.  Gastrointestinal: Negative.  Vomiting: one episode of vomiting last night.  Genitourinary: Negative.   Musculoskeletal: Negative.   Skin: Negative.   Neurological: Negative.   Psychiatric/Behavioral: Negative.     Allergies  Review of patient's allergies indicates no known allergies.  Home Medications   Current Outpatient Rx  Name  Route  Sig  Dispense  Refill  . albuterol (PROVENTIL HFA;VENTOLIN HFA) 108 (90 BASE) MCG/ACT inhaler   Inhalation   Inhale 1-2 puffs into the lungs every 6 (six) hours as needed for wheezing.  1 Inhaler   0   . albuterol (VENTOLIN HFA) 108 (90 BASE) MCG/ACT inhaler   Inhalation   Inhale 1 puff into the lungs 4 (four) times daily.           Marland Kitchen amoxicillin (AMOXIL) 500 MG capsule   Oral   Take 1 capsule (500 mg total) by mouth 3 (three) times daily.   21 capsule   0   . benazepril-hydrochlorthiazide (LOTENSIN HCT) 20-12.5 MG per tablet   Oral   Take 1 tablet by mouth daily. FOR YOUR HIGH BLOOD PRESSURE.   30 tablet   2   . benzonatate (TESSALON) 200 MG capsule   Oral   Take 200 mg by mouth 3 (three) times daily as needed. For cough          . doxycycline (VIBRAMYCIN) 100 MG capsule   Oral   Take 100 mg by mouth 2 (two) times daily.           Marland Kitchen LORazepam  (ATIVAN) 1 MG tablet   Oral   Take 1 tablet (1 mg total) by mouth 3 (three) times daily. DO NOT TAKE THIS MEDICINE WITH ALCOHOL.         . Multiple Vitamins-Minerals (MULTIVITAMINS THER. W/MINERALS) TABS   Oral   Take 1 tablet by mouth daily.   30 each      . OLANZapine (ZYPREXA) 20 MG tablet   Oral   Take 1 tablet (20 mg total) by mouth daily.   30 tablet   1   . EXPIRED: omeprazole (PRILOSEC) 40 MG capsule   Oral   Take 1 capsule (40 mg total) by mouth daily. FOR ACID REFLUX.   30 capsule   1   . predniSONE (DELTASONE) 20 MG tablet   Oral   Take 3 tablets (60 mg total) by mouth daily.   15 tablet   0   . raloxifene (EVISTA) 60 MG tablet   Oral   Take 60 mg by mouth daily.           . ranitidine (ZANTAC) 150 MG capsule   Oral   Take 1 capsule (150 mg total) by mouth 2 (two) times daily.   60 capsule   0     BP 180/108  Pulse 78  Temp(Src) 98.5 F (36.9 C) (Oral)  Resp 16  Ht 5\' 2"  (1.575 m)  Wt 105 lb (47.628 kg)  BMI 19.2 kg/m2  SpO2 100%  Physical Exam  Nursing note and vitals reviewed. Constitutional: She is oriented to person, place, and time.  Slender middleaged woman, no distress at rest.  HENT:  Head: Normocephalic and atraumatic.  Right Ear: External ear normal.  Left Ear: External ear normal.  She has a 1/2 cm diameter round white lesion that is slightly raised on the mucosal surface of the left cheek.  She has no upper teeth, and few lower ones.  Eyes: Conjunctivae and EOM are normal. Pupils are equal, round, and reactive to light.  Neck: Normal range of motion. Neck supple.  Cardiovascular: Normal rate, regular rhythm and normal heart sounds.   Pulmonary/Chest: Effort normal and breath sounds normal.  Abdominal: Soft. Bowel sounds are normal.  Musculoskeletal: Normal range of motion. She exhibits no edema and no tenderness.  Lymphadenopathy:    She has no cervical adenopathy.  Neurological: She is alert and oriented to person, place,  and time.  No sensory or motor deficit.  Skin: Skin is warm and dry.  Psychiatric: She has  a normal mood and affect. Her behavior is normal.    ED Course  Procedures (including critical care time)  Labs Reviewed  CBC WITH DIFFERENTIAL - Abnormal; Notable for the following:    Hemoglobin 15.7 (*)    MCH 34.3 (*)    All other components within normal limits  BASIC METABOLIC PANEL - Abnormal; Notable for the following:    Potassium 3.3 (*)    Glucose, Bld 167 (*)    All other components within normal limits  TROPONIN I   Dg Chest Portable 1 View  06/26/2012  *RADIOLOGY REPORT*  Clinical Data: Chest pain and shortness of breath.  PORTABLE CHEST - 1 VIEW  Comparison: 05/19/2012.  Findings: Trachea is midline.  Heart size normal.  Lungs are hyperinflated but clear.  No pleural fluid.  IMPRESSION: COPD without acute finding.   Original Report Authenticated By: Leanna Battles, M.D.     1:23 PM  Date: 06/26/2012  Rate:97  Rhythm: normal sinus rhythm  QRS Axis: normal  Intervals: normal PQRS:  biatrial enlargement.  ST/T Wave abnormalities: nonspecific ST changes  Conduction Disutrbances:none  Narrative Interpretation: Abnormal EKG  Old EKG Reviewed: unchanged  3:28 PM Results for orders placed during the hospital encounter of 06/26/12  CBC WITH DIFFERENTIAL      Result Value Range   WBC 5.8  4.0 - 10.5 K/uL   RBC 4.58  3.87 - 5.11 MIL/uL   Hemoglobin 15.7 (*) 12.0 - 15.0 g/dL   HCT 16.1  09.6 - 04.5 %   MCV 97.4  78.0 - 100.0 fL   MCH 34.3 (*) 26.0 - 34.0 pg   MCHC 35.2  30.0 - 36.0 g/dL   RDW 40.9  81.1 - 91.4 %   Platelets 369  150 - 400 K/uL   Neutrophils Relative 43  43 - 77 %   Neutro Abs 2.5  1.7 - 7.7 K/uL   Lymphocytes Relative 45  12 - 46 %   Lymphs Abs 2.6  0.7 - 4.0 K/uL   Monocytes Relative 11  3 - 12 %   Monocytes Absolute 0.7  0.1 - 1.0 K/uL   Eosinophils Relative 1  0 - 5 %   Eosinophils Absolute 0.0  0.0 - 0.7 K/uL   Basophils Relative 1  0 - 1 %    Basophils Absolute 0.0  0.0 - 0.1 K/uL  BASIC METABOLIC PANEL      Result Value Range   Sodium 136  135 - 145 mEq/L   Potassium 3.3 (*) 3.5 - 5.1 mEq/L   Chloride 98  96 - 112 mEq/L   CO2 24  19 - 32 mEq/L   Glucose, Bld 167 (*) 70 - 99 mg/dL   BUN 9  6 - 23 mg/dL   Creatinine, Ser 7.82  0.50 - 1.10 mg/dL   Calcium 95.6  8.4 - 21.3 mg/dL   GFR calc non Af Amer >90  >90 mL/min   GFR calc Af Amer >90  >90 mL/min  TROPONIN I      Result Value Range   Troponin I <0.30  <0.30 ng/mL  HEPATIC FUNCTION PANEL      Result Value Range   Total Protein 8.7 (*) 6.0 - 8.3 g/dL   Albumin 4.3  3.5 - 5.2 g/dL   AST 37  0 - 37 U/L   ALT 20  0 - 35 U/L   Alkaline Phosphatase 86  39 - 117 U/L   Total Bilirubin 0.8  0.3 -  1.2 mg/dL   Bilirubin, Direct 0.2  0.0 - 0.3 mg/dL   Indirect Bilirubin 0.6  0.3 - 0.9 mg/dL  ETHANOL      Result Value Range   Alcohol, Ethyl (B) <11  0 - 11 mg/dL  URINALYSIS, ROUTINE W REFLEX MICROSCOPIC      Result Value Range   Color, Urine YELLOW  YELLOW   APPearance CLOUDY (*) CLEAR   Specific Gravity, Urine 1.020  1.005 - 1.030   pH 6.5  5.0 - 8.0   Glucose, UA NEGATIVE  NEGATIVE mg/dL   Hgb urine dipstick NEGATIVE  NEGATIVE   Bilirubin Urine SMALL (*) NEGATIVE   Ketones, ur NEGATIVE  NEGATIVE mg/dL   Protein, ur 30 (*) NEGATIVE mg/dL   Urobilinogen, UA 1.0  0.0 - 1.0 mg/dL   Nitrite NEGATIVE  NEGATIVE   Leukocytes, UA NEGATIVE  NEGATIVE  URINE RAPID DRUG SCREEN (HOSP PERFORMED)      Result Value Range   Opiates NONE DETECTED  NONE DETECTED   Cocaine POSITIVE (*) NONE DETECTED   Benzodiazepines NONE DETECTED  NONE DETECTED   Amphetamines NONE DETECTED  NONE DETECTED   Tetrahydrocannabinol POSITIVE (*) NONE DETECTED   Barbiturates NONE DETECTED  NONE DETECTED  URINE MICROSCOPIC-ADD ON      Result Value Range   Squamous Epithelial / LPF MANY (*) RARE   WBC, UA 0-2  <3 WBC/hpf   RBC / HPF 0-2  <3 RBC/hpf   Bacteria, UA MANY (*) RARE   Dg Chest Portable 1  View  06/26/2012  *RADIOLOGY REPORT*  Clinical Data: Chest pain and shortness of breath.  PORTABLE CHEST - 1 VIEW  Comparison: 05/19/2012.  Findings: Trachea is midline.  Heart size normal.  Lungs are hyperinflated but clear.  No pleural fluid.  IMPRESSION: COPD without acute finding.   Original Report Authenticated By: Leanna Battles, M.D.     3:29 PM I reviewed pt's lab tests with her.  The only thing that showed up was UDS positive for cocaine and marijuana.  I advised pt that she could get treatment for substance abuse at [facility], but she declined that.  She has a lesion on the mucosal surface of the left cheek that needs biopsy or excision.  Referred to Dr. Suszanne Conners, ENT surgeon, for evaluation of her intraoral lesion.   1. Mass of intraoral region         Carleene Cooper III, MD 06/26/12 1540

## 2012-06-26 NOTE — ED Notes (Signed)
Dr. Ignacia Palma in with pt at present moment.

## 2012-07-04 ENCOUNTER — Emergency Department (HOSPITAL_COMMUNITY)
Admission: EM | Admit: 2012-07-04 | Discharge: 2012-07-05 | Disposition: A | Discharge status: Home / Self Care | Payer: Medicaid Other | Attending: Emergency Medicine | Admitting: Emergency Medicine

## 2012-07-04 ENCOUNTER — Ambulatory Visit (INDEPENDENT_AMBULATORY_CARE_PROVIDER_SITE_OTHER): Payer: Medicaid Other | Admitting: Otolaryngology

## 2012-07-04 ENCOUNTER — Encounter (HOSPITAL_COMMUNITY): Payer: Self-pay | Admitting: Emergency Medicine

## 2012-07-04 ENCOUNTER — Other Ambulatory Visit (HOSPITAL_COMMUNITY)
Admission: RE | Admit: 2012-07-04 | Discharge: 2012-07-04 | Disposition: A | Discharge status: Home / Self Care | Payer: Medicaid Other | Source: Ambulatory Visit | Attending: Otolaryngology | Admitting: Otolaryngology

## 2012-07-04 DIAGNOSIS — J441 Chronic obstructive pulmonary disease with (acute) exacerbation: Secondary | ICD-10-CM | POA: Insufficient documentation

## 2012-07-04 DIAGNOSIS — I10 Essential (primary) hypertension: Secondary | ICD-10-CM | POA: Insufficient documentation

## 2012-07-04 DIAGNOSIS — D3701 Neoplasm of uncertain behavior of lip: Secondary | ICD-10-CM

## 2012-07-04 DIAGNOSIS — Z8719 Personal history of other diseases of the digestive system: Secondary | ICD-10-CM | POA: Insufficient documentation

## 2012-07-04 DIAGNOSIS — Z8659 Personal history of other mental and behavioral disorders: Secondary | ICD-10-CM | POA: Insufficient documentation

## 2012-07-04 DIAGNOSIS — Y849 Medical procedure, unspecified as the cause of abnormal reaction of the patient, or of later complication, without mention of misadventure at the time of the procedure: Secondary | ICD-10-CM | POA: Insufficient documentation

## 2012-07-04 DIAGNOSIS — F411 Generalized anxiety disorder: Secondary | ICD-10-CM | POA: Insufficient documentation

## 2012-07-04 DIAGNOSIS — Z8709 Personal history of other diseases of the respiratory system: Secondary | ICD-10-CM | POA: Insufficient documentation

## 2012-07-04 DIAGNOSIS — Z79899 Other long term (current) drug therapy: Secondary | ICD-10-CM | POA: Insufficient documentation

## 2012-07-04 DIAGNOSIS — F17200 Nicotine dependence, unspecified, uncomplicated: Secondary | ICD-10-CM | POA: Insufficient documentation

## 2012-07-04 DIAGNOSIS — IMO0002 Reserved for concepts with insufficient information to code with codable children: Secondary | ICD-10-CM | POA: Insufficient documentation

## 2012-07-04 DIAGNOSIS — T888XXA Other specified complications of surgical and medical care, not elsewhere classified, initial encounter: Secondary | ICD-10-CM

## 2012-07-04 MED ORDER — "THROMBI-PAD 3""X3"" EX PADS"
1.0000 | MEDICATED_PAD | Freq: Once | CUTANEOUS | Status: DC
  Filled 2012-07-04: qty 1

## 2012-07-04 MED ORDER — LIDOCAINE-EPINEPHRINE (PF) 1 %-1:200000 IJ SOLN
10.0000 mL | Freq: Once | INTRAMUSCULAR | Status: DC
  Filled 2012-07-04: qty 10

## 2012-07-04 MED ORDER — LORAZEPAM 1 MG PO TABS: 1.0000 mg | ORAL_TABLET | Freq: Once | ORAL | Status: DC

## 2012-07-04 NOTE — ED Notes (Signed)
Pt is uncontrollable at this time and bleeding from the mouth. Per ems pt had a cyst removed from her mouth today. Pt is spitting blood everywhere.

## 2012-07-04 NOTE — ED Notes (Signed)
Patient left ambulatory out ambulance entrance doors following her brother. Security followed her out. Patient kept walking across parking lot after her brother. Had not returned to the ER at this time.

## 2012-07-04 NOTE — ED Provider Notes (Signed)
History     CSN: [id number]  Arrival date & time 07/04/12  2304   First MD Initiated Contact with Patient 07/04/12 2311      Chief Complaint  Patient presents with  . Bleeding/Bruising    (Consider location/radiation/quality/duration/timing/severity/associated sxs/prior treatment) HPI Hx per PT. BIB EMS for mouth bleeding.  Hx cyst in her L cheek with resection today. Tonight she has been drinking alcohol and it started bleeding.  No syncope, no trouble breathing or swallowing.  Mild to mod in severity. No weakness or dizzieness.  She has not tried anything at home to stop this.  Past Medical History  Diagnosis Date  . Hypertension   . Tobacco abuse   . Alcohol abuse   . Cocaine abuse   . 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 Auditory hallucinations   . Anxiety disorder   . Schizophrenia   . COPD (chronic obstructive pulmonary disease)   . Emphysema   . Chronic bronchitis with acute exacerbation     Past Surgical History  Procedure Laterality Date  . Abdominal hysterectomy    . Breast biopsy      Family History  Problem Relation Age of Onset  . Heart failure Other     History  Substance Use Topics  . Smoking status: Current Every Day Smoker -- 2.00 packs/day    Types: Cigarettes  . Smokeless tobacco: Not on file  . Alcohol Use: 1.2 oz/week    2 Cans of beer per week    OB History   Grav Para Term Preterm Abortions TAB SAB Ect Mult Living   3 3 3       3       Review of Systems  Constitutional: Negative for fever and chills.  HENT: Negative for neck pain and neck stiffness.   Eyes: Negative for pain.  Respiratory: Negative for shortness of breath.   Cardiovascular: Negative for chest pain.  Gastrointestinal: Negative for abdominal pain.  Genitourinary: Negative for dysuria.  Musculoskeletal: Negative for back pain.  Skin: Negative for rash.  Neurological: Negative for headaches.  Hematological: Does not bruise/bleed easily.  All other systems reviewed  and are negative.    Allergies  Review of patient's allergies indicates no known allergies.  Home Medications   Current Outpatient Rx  Name  Route  Sig  Dispense  Refill  . albuterol (PROVENTIL HFA;VENTOLIN HFA) 108 (90 BASE) MCG/ACT inhaler   Inhalation   Inhale 1-2 puffs into the lungs every 6 (six) hours as needed for wheezing.   1 Inhaler   0   . benazepril-hydrochlorthiazide (LOTENSIN HCT) 20-12.5 MG per tablet   Oral   Take 1 tablet by mouth daily. FOR YOUR HIGH BLOOD PRESSURE.   30 tablet   2   . HYDROcodone-acetaminophen (NORCO/VICODIN) 5-325 MG per tablet   Oral   Take 1 tablet by mouth every 6 (six) hours as needed for pain.         Marland Kitchen LORazepam (ATIVAN) 1 MG tablet   Oral   Take 1 tablet (1 mg total) by mouth 3 (three) times daily. DO NOT TAKE THIS MEDICINE WITH ALCOHOL.         . Multiple Vitamins-Minerals (MULTIVITAMINS THER. W/MINERALS) TABS   Oral   Take 1 tablet by mouth daily.   30 each      . OLANZapine (ZYPREXA) 20 MG tablet   Oral   Take 1 tablet (20 mg total) by mouth daily.   30 tablet   1   .  raloxifene (EVISTA) 60 MG tablet   Oral   Take 60 mg by mouth daily.           . ranitidine (ZANTAC) 150 MG capsule   Oral   Take 1 capsule (150 mg total) by mouth 2 (two) times daily.   60 capsule   0     BP 154/115  Pulse 125  Temp(Src) 98.6 F (37 C) (Oral)  Resp 20  SpO2 96%  Physical Exam  Constitutional: She is oriented to person, place, and time. She appears well-developed and well-nourished.  HENT:  Head: Normocephalic and atraumatic.  L intraoral cheek post op site with mild oozing. Airway intact, no trouble handling secretions  Eyes: EOM are normal. Pupils are equal, round, and reactive to light.  Neck: Neck supple.  Cardiovascular: Normal rate, regular rhythm and intact distal pulses.   Pulmonary/Chest: Effort normal and breath sounds normal. No stridor. No respiratory distress.  Musculoskeletal: Normal range of motion.  She exhibits no edema.  Neurological: She is alert and oriented to person, place, and time.  Skin: Skin is warm and dry.  Psychiatric:  agitated    ED Course  Procedures (including critical care time)    1. Post-op bleeding, initial encounter     I offered local injection of lido/ epi which PT declines.  I offered ativan and thrombin pad.    PTs brother presents bedside and PT now wishes to go home.  He is willing to take her home and assure that she gets follow up with her physician.  No indication for blood work or admit at this time.   MDM  Mild post operative oozing from oral lesion  VS and nursing notes reviewed  Old records reviewed - cyst x 4 months, resected earlier today        Sunnie Nielsen, MD 07/04/12 2343

## 2012-07-18 ENCOUNTER — Ambulatory Visit (INDEPENDENT_AMBULATORY_CARE_PROVIDER_SITE_OTHER): Payer: Medicaid Other | Admitting: Otolaryngology

## 2012-07-18 DIAGNOSIS — D3701 Neoplasm of uncertain behavior of lip: Secondary | ICD-10-CM

## 2012-07-18 DIAGNOSIS — H9209 Otalgia, unspecified ear: Secondary | ICD-10-CM

## 2012-10-10 DIAGNOSIS — R109 Unspecified abdominal pain: Secondary | ICD-10-CM

## 2013-01-05 ENCOUNTER — Inpatient Hospital Stay (HOSPITAL_COMMUNITY)
Admission: EM | Admit: 2013-01-05 | Discharge: 2013-01-07 | DRG: 896 | Disposition: A | Discharge status: Home / Self Care | Payer: MEDICAID | Attending: Internal Medicine | Admitting: Internal Medicine

## 2013-01-05 ENCOUNTER — Emergency Department (HOSPITAL_COMMUNITY): Payer: MEDICAID

## 2013-01-05 ENCOUNTER — Encounter (HOSPITAL_COMMUNITY): Payer: Self-pay

## 2013-01-05 DIAGNOSIS — K7010 Alcoholic hepatitis without ascites: Secondary | ICD-10-CM | POA: Diagnosis present

## 2013-01-05 DIAGNOSIS — F1010 Alcohol abuse, uncomplicated: Principal | ICD-10-CM

## 2013-01-05 DIAGNOSIS — F10929 Alcohol use, unspecified with intoxication, unspecified: Secondary | ICD-10-CM

## 2013-01-05 DIAGNOSIS — I4891 Unspecified atrial fibrillation: Secondary | ICD-10-CM

## 2013-01-05 DIAGNOSIS — I10 Essential (primary) hypertension: Secondary | ICD-10-CM

## 2013-01-05 DIAGNOSIS — F79 Unspecified intellectual disabilities: Secondary | ICD-10-CM | POA: Diagnosis present

## 2013-01-05 DIAGNOSIS — G9341 Metabolic encephalopathy: Secondary | ICD-10-CM | POA: Diagnosis present

## 2013-01-05 DIAGNOSIS — R079 Chest pain, unspecified: Secondary | ICD-10-CM

## 2013-01-05 DIAGNOSIS — F411 Generalized anxiety disorder: Secondary | ICD-10-CM | POA: Diagnosis present

## 2013-01-05 DIAGNOSIS — F209 Schizophrenia, unspecified: Secondary | ICD-10-CM | POA: Diagnosis present

## 2013-01-05 DIAGNOSIS — F1410 Cocaine abuse, uncomplicated: Secondary | ICD-10-CM | POA: Diagnosis present

## 2013-01-05 DIAGNOSIS — F17200 Nicotine dependence, unspecified, uncomplicated: Secondary | ICD-10-CM | POA: Diagnosis present

## 2013-01-05 DIAGNOSIS — F1020 Alcohol dependence, uncomplicated: Secondary | ICD-10-CM | POA: Diagnosis present

## 2013-01-05 LAB — CBC WITH DIFFERENTIAL/PLATELET
Basophils Relative: 1 % (ref 0–1)
Eosinophils Absolute: 0.1 10*3/uL (ref 0.0–0.7)
Hemoglobin: 13.2 g/dL (ref 12.0–15.0)
Lymphs Abs: 4.4 10*3/uL — ABNORMAL HIGH (ref 0.7–4.0)
MCH: 33 pg (ref 26.0–34.0)
Monocytes Relative: 4 % (ref 3–12)
Neutro Abs: 2.3 10*3/uL (ref 1.7–7.7)
Neutrophils Relative %: 33 % — ABNORMAL LOW (ref 43–77)
Platelets: 381 10*3/uL (ref 150–400)
RBC: 4 MIL/uL (ref 3.87–5.11)

## 2013-01-05 LAB — BASIC METABOLIC PANEL
Chloride: 102 mEq/L (ref 96–112)
GFR calc Af Amer: >90 mL/min (ref >90)
GFR calc non Af Amer: >90 mL/min (ref >90)
Potassium: 3.6 mEq/L (ref 3.5–5.1)
Sodium: 139 mEq/L (ref 135–145)

## 2013-01-05 LAB — ETHANOL: Alcohol, Ethyl (B): 384 mg/dL — ABNORMAL HIGH (ref 0–11)

## 2013-01-05 MED ORDER — ACETAMINOPHEN 650 MG RE SUPP: 650.0000 mg | Freq: Four times a day (QID) | RECTAL | Status: DC | PRN

## 2013-01-05 MED ORDER — THIAMINE HCL 100 MG/ML IJ SOLN
INTRAMUSCULAR | Status: AC
  Filled 2013-01-05: qty 2

## 2013-01-05 MED ORDER — LORAZEPAM 2 MG/ML IJ SOLN
INTRAMUSCULAR | Status: AC
  Filled 2013-01-05: qty 1

## 2013-01-05 MED ORDER — HYDRALAZINE HCL 20 MG/ML IJ SOLN: 10.0000 mg | Freq: Four times a day (QID) | INTRAMUSCULAR | Status: DC | PRN

## 2013-01-05 MED ORDER — SODIUM CHLORIDE 0.9 % IV BOLUS (SEPSIS)
500.0000 mL | Freq: Once | INTRAVENOUS | Status: AC
  Administered 2013-01-05: 500 mL via INTRAVENOUS

## 2013-01-05 MED ORDER — BENAZEPRIL HCL 10 MG PO TABS
20.0000 mg | ORAL_TABLET | Freq: Every day | ORAL | Status: DC
  Administered 2013-01-06 – 2013-01-07 (×2): 20 mg via ORAL
  Filled 2013-01-05 (×2): qty 2

## 2013-01-05 MED ORDER — THIAMINE HCL 100 MG/ML IJ SOLN
Freq: Once | INTRAVENOUS | Status: AC
  Administered 2013-01-05: 23:00:00 via INTRAVENOUS
  Filled 2013-01-05: qty 1000

## 2013-01-05 MED ORDER — FOLIC ACID 5 MG/ML IJ SOLN
INTRAMUSCULAR | Status: AC
  Filled 2013-01-05: qty 0.2

## 2013-01-05 MED ORDER — LORAZEPAM 2 MG/ML IJ SOLN
1.0000 mg | Freq: Once | INTRAMUSCULAR | Status: AC
  Administered 2013-01-05: 1 mg via INTRAVENOUS

## 2013-01-05 MED ORDER — VITAMIN B-1 100 MG PO TABS
100.0000 mg | ORAL_TABLET | Freq: Every day | ORAL | Status: DC
  Administered 2013-01-06 – 2013-01-07 (×2): 100 mg via ORAL
  Filled 2013-01-05 (×2): qty 1

## 2013-01-05 MED ORDER — ALBUTEROL SULFATE (5 MG/ML) 0.5% IN NEBU: 2.5000 mg | INHALATION_SOLUTION | RESPIRATORY_TRACT | Status: DC | PRN

## 2013-01-05 MED ORDER — LORAZEPAM 1 MG PO TABS: 1.0000 mg | ORAL_TABLET | Freq: Four times a day (QID) | ORAL | Status: DC | PRN

## 2013-01-05 MED ORDER — ALBUTEROL SULFATE HFA 108 (90 BASE) MCG/ACT IN AERS: 1.0000 | INHALATION_SPRAY | Freq: Four times a day (QID) | RESPIRATORY_TRACT | Status: DC | PRN

## 2013-01-05 MED ORDER — ADULT MULTIVITAMIN W/MINERALS CH
1.0000 | ORAL_TABLET | Freq: Every day | ORAL | Status: DC
  Administered 2013-01-06 – 2013-01-07 (×2): 1 via ORAL
  Filled 2013-01-05 (×3): qty 1

## 2013-01-05 MED ORDER — HYDROCHLOROTHIAZIDE 12.5 MG PO CAPS
12.5000 mg | ORAL_CAPSULE | Freq: Every day | ORAL | Status: DC
  Administered 2013-01-06 – 2013-01-07 (×2): 12.5 mg via ORAL
  Filled 2013-01-05 (×2): qty 1

## 2013-01-05 MED ORDER — RALOXIFENE HCL 60 MG PO TABS
60.0000 mg | ORAL_TABLET | Freq: Every day | ORAL | Status: DC
Start: 2013-01-05 — End: 2013-01-07
  Administered 2013-01-06 – 2013-01-07 (×2): 60 mg via ORAL
  Filled 2013-01-05 (×3): qty 1

## 2013-01-05 MED ORDER — DILTIAZEM HCL 100 MG IV SOLR
5.0000 mg/h | INTRAVENOUS | Status: DC
  Filled 2013-01-05: qty 100

## 2013-01-05 MED ORDER — OLANZAPINE 5 MG PO TABS
20.0000 mg | ORAL_TABLET | Freq: Every day | ORAL | Status: DC
  Administered 2013-01-06 – 2013-01-07 (×2): 20 mg via ORAL
  Filled 2013-01-05: qty 2
  Filled 2013-01-05: qty 3
  Filled 2013-01-05 (×2): qty 4

## 2013-01-05 MED ORDER — PANTOPRAZOLE SODIUM 40 MG IV SOLR
40.0000 mg | INTRAVENOUS | Status: DC
  Administered 2013-01-05 – 2013-01-06 (×2): 40 mg via INTRAVENOUS
  Filled 2013-01-05 (×2): qty 40

## 2013-01-05 MED ORDER — FOLIC ACID 1 MG PO TABS
1.0000 mg | ORAL_TABLET | Freq: Every day | ORAL | Status: DC
  Administered 2013-01-06 – 2013-01-07 (×2): 1 mg via ORAL
  Filled 2013-01-05 (×3): qty 1

## 2013-01-05 MED ORDER — M.V.I. ADULT IV INJ
INJECTION | INTRAVENOUS | Status: AC
  Filled 2013-01-05: qty 10

## 2013-01-05 MED ORDER — DILTIAZEM HCL 25 MG/5ML IV SOLN
10.0000 mg | Freq: Once | INTRAVENOUS | Status: AC
  Administered 2013-01-05: 10 mg via INTRAVENOUS
  Filled 2013-01-05: qty 5

## 2013-01-05 MED ORDER — THIAMINE HCL 100 MG/ML IJ SOLN
100.0000 mg | Freq: Every day | INTRAMUSCULAR | Status: DC
  Administered 2013-01-05: 100 mg via INTRAVENOUS
  Filled 2013-01-05: qty 2

## 2013-01-05 MED ORDER — BENAZEPRIL-HYDROCHLOROTHIAZIDE 20-12.5 MG PO TABS: 1.0000 | ORAL_TABLET | Freq: Every day | ORAL | Status: DC

## 2013-01-05 MED ORDER — ACETAMINOPHEN 325 MG PO TABS: 650.0000 mg | ORAL_TABLET | Freq: Four times a day (QID) | ORAL | Status: DC | PRN

## 2013-01-05 MED ORDER — LORAZEPAM 2 MG/ML IJ SOLN
1.0000 mg | Freq: Four times a day (QID) | INTRAMUSCULAR | Status: DC | PRN
  Administered 2013-01-05: 1 mg via INTRAVENOUS
  Filled 2013-01-05: qty 1

## 2013-01-05 NOTE — ED Provider Notes (Signed)
CSN: [id number]     Arrival date & time 01/05/13  1751 History   First MD Initiated Contact with Patient 01/05/13 1807     Chief Complaint  Patient presents with  . Shortness of Breath   (Consider location/radiation/quality/duration/timing/severity/associated sxs/prior Treatment) HPI... level V caveat for intoxication.   Patient complains of shortness of breath this evening.   She is been drinking heavily. Past medical history includes alcohol and cocaine abuse, schizophrenia, anxiety disorder, COPD.  Also complains of chest pain.  Severity is moderate to severe. Nothing makes symptoms worse.  Past Medical History  Diagnosis Date  . Hypertension   . Tobacco abuse   . Alcohol abuse   . Cocaine abuse   . 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 Auditory hallucinations   . Anxiety disorder   . Schizophrenia   . COPD (chronic obstructive pulmonary disease)   . Emphysema   . Chronic bronchitis with acute exacerbation    Past Surgical History  Procedure Laterality Date  . Abdominal hysterectomy    . Breast biopsy     Family History  Problem Relation Age of Onset  . Heart failure Other    History  Substance Use Topics  . Smoking status: Current Every Day Smoker -- 2.00 packs/day    Types: Cigarettes  . Smokeless tobacco: Not on file  . Alcohol Use: 1.2 oz/week    2 Cans of beer per week   OB History   Grav Para Term Preterm Abortions TAB SAB Ect Mult Living   3 3 3       3      Review of Systems  Unable to perform ROS: Acuity of condition    Allergies  Review of patient's allergies indicates no known allergies.  Home Medications   Current Outpatient Rx  Name  Route  Sig  Dispense  Refill  . albuterol (PROVENTIL HFA;VENTOLIN HFA) 108 (90 BASE) MCG/ACT inhaler   Inhalation   Inhale 1-2 puffs into the lungs every 6 (six) hours as needed for wheezing.   1 Inhaler   0   . benazepril-hydrochlorthiazide (LOTENSIN HCT) 20-12.5 MG per tablet   Oral   Take 1 tablet by mouth  daily. FOR YOUR HIGH BLOOD PRESSURE.   30 tablet   2   . HYDROcodone-acetaminophen (NORCO/VICODIN) 5-325 MG per tablet   Oral   Take 1 tablet by mouth every 6 (six) hours as needed for pain.         Marland Kitchen LORazepam (ATIVAN) 1 MG tablet   Oral   Take 1 tablet (1 mg total) by mouth 3 (three) times daily. DO NOT TAKE THIS MEDICINE WITH ALCOHOL.         . Multiple Vitamins-Minerals (MULTIVITAMINS THER. W/MINERALS) TABS   Oral   Take 1 tablet by mouth daily.   30 each      . OLANZapine (ZYPREXA) 20 MG tablet   Oral   Take 1 tablet (20 mg total) by mouth daily.   30 tablet   1   . raloxifene (EVISTA) 60 MG tablet   Oral   Take 60 mg by mouth daily.           . ranitidine (ZANTAC) 150 MG capsule   Oral   Take 1 capsule (150 mg total) by mouth 2 (two) times daily.   60 capsule   0    BP 112/79  Pulse 113  Resp 48  SpO2 96% Physical Exam  Nursing note and vitals reviewed. Constitutional: She is oriented  to person, place, and time. She appears well-developed and well-nourished.  HENT:  Head: Normocephalic and atraumatic.  Eyes: Conjunctivae and EOM are normal. Pupils are equal, round, and reactive to light.  Neck: Normal range of motion. Neck supple.  Cardiovascular:  Tachycardic, irregularly irregular  Pulmonary/Chest: Effort normal and breath sounds normal.  Abdominal: Soft. Bowel sounds are normal.  Musculoskeletal: Normal range of motion.  Neurological: She is alert and oriented to person, place, and time.  Skin: Skin is warm and dry.  Psychiatric: She has a normal mood and affect.    ED Course  Procedures (including critical care time) Labs Review Labs Reviewed  ETHANOL - Abnormal; Notable for the following:    Alcohol, Ethyl (B) 384 (*)    All other components within normal limits  CBC WITH DIFFERENTIAL - Abnormal; Notable for the following:    Neutrophils Relative % 33 (*)    Lymphocytes Relative 62 (*)    Lymphs Abs 4.4 (*)    All other components  within normal limits  BASIC METABOLIC PANEL  TROPONIN I  URINE RAPID DRUG SCREEN (HOSP PERFORMED)  URINALYSIS, ROUTINE W REFLEX MICROSCOPIC   Imaging Review Dg Chest Portable 1 View  01/05/2013   *RADIOLOGY REPORT*  Clinical Data: Shortness of breath.  PORTABLE CHEST - 1 VIEW  Comparison: Plain film chest 10/10/2012 and PA and lateral chest 05/19/2012.  Findings: There is cardiomegaly.  Lung volumes are low with subsegmental atelectasis in the bases.  No pneumothorax or pleural effusion.  IMPRESSION: Cardiomegaly and bibasilar subsegmental atelectasis.   Original Report Authenticated By: Holley Dexter, M.D.   Date: 01/05/2013  Rate: 142  Rhythm: a fib  QRS Axis: normal  Intervals: normal  ST/T Wave abnormalities: normal  Conduction Disutrbances: none  Narrative Interpretation: unremarkable   CRITICAL CARE Performed by: Donnetta Hutching Total critical care time:30 Critical care time was exclusive of separately billable procedures and treating other patients. Critical care was necessary to treat or prevent imminent or life-threatening deterioration. Critical care was time spent personally by me on the following activities: development of treatment plan with patient and/or surrogate as well as nursing, discussions with consultants, evaluation of patient's response to treatment, examination of patient, obtaining history from patient or surrogate, ordering and performing treatments and interventions, ordering and review of laboratory studies, ordering and review of radiographic studies, pulse oximetry and re-evaluation of patient's condition.  MDM  No diagnosis found. Patient is in rapid atrial fibrillation. She is also intoxicated.  Initial rate of 142.  Good response to iv cardizem.  Pt is also very restless.    Donnetta Hutching, MD 01/05/13 2043

## 2013-01-05 NOTE — H&P (Signed)
PCP:   HASANAJ,XAJE A, MD   Chief Complaint:  Shortness of breath  HPI: 52 year old female with a history of hypertension, alcohol abuse, cocaine abuse, schizophrenia, COPD who was brought to the hospital by EMS in alcohol intoxication. Patient is obtunded at this time, barely opens eyes to sternal rub and is unable to provide any meaningful history. As per the ED nursetes EMS was called for difficulty breathing, and upon arrival she was found to be agitated and required one dose of Ativan and now she is sedated. Her alcohol level is 384. There is no family at bedside, called patient's daughter who does not live with the patient, she tells me that patient lives by herself, and has been drinking alcohol daily for a very long time. In the ED patient also was found to be in atrial fibrillation, which converted to normal sinus rhythm after she received Cardizem bolus x1. Urine drug screen is pending at this time. Patient does not appear to be dyspneic. No review of systems  obtainable  due to somnolence.  Allergies:  No Known Allergies    Past Medical History  Diagnosis Date  . Hypertension   . Tobacco abuse   . Alcohol abuse   . Cocaine abuse   . 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 Auditory hallucinations   . Anxiety disorder   . Schizophrenia   . COPD (chronic obstructive pulmonary disease)   . Emphysema   . Chronic bronchitis with acute exacerbation     Past Surgical History  Procedure Laterality Date  . Abdominal hysterectomy    . Breast biopsy      Prior to Admission medications   Medication Sig Start Date End Date Taking? Authorizing Provider  albuterol (PROVENTIL HFA;VENTOLIN HFA) 108 (90 BASE) MCG/ACT inhaler Inhale 1-2 puffs into the lungs every 6 (six) hours as needed for wheezing. 04/12/12   Donnetta Hutching, MD  benazepril-hydrochlorthiazide (LOTENSIN HCT) 20-12.5 MG per tablet Take 1 tablet by mouth daily. FOR YOUR HIGH BLOOD PRESSURE. 03/16/11   Elliot Cousin, MD   HYDROcodone-acetaminophen (NORCO/VICODIN) 5-325 MG per tablet Take 1 tablet by mouth every 6 (six) hours as needed for pain.    Historical Provider, MD  LORazepam (ATIVAN) 1 MG tablet Take 1 tablet (1 mg total) by mouth 3 (three) times daily. DO NOT TAKE THIS MEDICINE WITH ALCOHOL. 03/16/11   Elliot Cousin, MD  Multiple Vitamins-Minerals (MULTIVITAMINS THER. W/MINERALS) TABS Take 1 tablet by mouth daily. 03/16/11   Elliot Cousin, MD  OLANZapine (ZYPREXA) 20 MG tablet Take 1 tablet (20 mg total) by mouth daily. 03/16/11   Elliot Cousin, MD  raloxifene (EVISTA) 60 MG tablet Take 60 mg by mouth daily.      Historical Provider, MD  ranitidine (ZANTAC) 150 MG capsule Take 1 capsule (150 mg total) by mouth 2 (two) times daily. 01/07/12 01/06/13  [name], MD    Social History:  reports that she has been smoking Cigarettes.  She has been smoking about 2.00 packs per day. She does not have any smokeless tobacco history on file. She reports that she drinks about 1.2 ounces of alcohol per week. She reports that she uses illicit drugs (Cocaine and Marijuana).  Family History  Problem Relation Age of Onset  . Heart failure Other      Review of systems Unavailable due to altered mental status    Physical Exam: Blood pressure 118/84, pulse 102, resp. rate 21, SpO2 99.00%. Constitutional:   Patient is a well-developed and well-nourished female* hypersomnolent  and unable to provide any history Head: Normocephalic and atraumatic Mouth: Mucus membranes moist Eyes: PERRL, EOMI, conjunctivae normal Neck: Supple, No Thyromegaly Cardiovascular: RRR, S1 normal, S2 normal Pulmonary/Chest: CTAB, no wheezes, rales, or rhonchi Abdominal: Soft. Non-tender, non-distended, bowel sounds are normal, no masses, organomegaly, or guarding present.  Neurological: Somnolent, was able to move all the extremities as per nursing staff Extremities : No Cyanosis, Clubbing or Edema   Labs on Admission:  Results for  orders placed during the hospital encounter of 01/05/13 (from the past 48 hour(s))  ETHANOL     Status: Abnormal   Collection Time    01/05/13  6:13 PM      Result Value Range   Alcohol, Ethyl (B) 384 (*) 0 - 11 mg/dL   Comment:            LOWEST DETECTABLE LIMIT FOR     SERUM ALCOHOL IS 11 mg/dL     FOR MEDICAL PURPOSES ONLY  CBC WITH DIFFERENTIAL     Status: Abnormal   Collection Time    01/05/13  6:13 PM      Result Value Range   WBC 7.2  4.0 - 10.5 K/uL   RBC 4.00  3.87 - 5.11 MIL/uL   Hemoglobin 13.2  12.0 - 15.0 g/dL   HCT 16.1  09.6 - 04.5 %   MCV 95.3  78.0 - 100.0 fL   MCH 33.0  26.0 - 34.0 pg   MCHC 34.6  30.0 - 36.0 g/dL   RDW 40.9  81.1 - 91.4 %   Platelets 381  150 - 400 K/uL   Neutrophils Relative % 33 (*) 43 - 77 %   Neutro Abs 2.3  1.7 - 7.7 K/uL   Lymphocytes Relative 62 (*) 12 - 46 %   Lymphs Abs 4.4 (*) 0.7 - 4.0 K/uL   Monocytes Relative 4  3 - 12 %   Monocytes Absolute 0.3  0.1 - 1.0 K/uL   Eosinophils Relative 1  0 - 5 %   Eosinophils Absolute 0.1  0.0 - 0.7 K/uL   Basophils Relative 1  0 - 1 %   Basophils Absolute 0.1  0.0 - 0.1 K/uL  BASIC METABOLIC PANEL     Status: None   Collection Time    01/05/13  6:13 PM      Result Value Range   Sodium 139  135 - 145 mEq/L   Potassium 3.6  3.5 - 5.1 mEq/L   Chloride 102  96 - 112 mEq/L   CO2 21  19 - 32 mEq/L   Glucose, Bld 75  70 - 99 mg/dL   BUN 11  6 - 23 mg/dL   Creatinine, Ser 7.82  0.50 - 1.10 mg/dL   Calcium 9.1  8.4 - 95.6 mg/dL   GFR calc non Af Amer >90  >90 mL/min   GFR calc Af Amer >90  >90 mL/min   Comment: (NOTE)     The eGFR has been calculated using the CKD EPI equation.     This calculation has not been validated in all clinical situations.     eGFR's persistently <90 mL/min signify possible Chronic Kidney     Disease.  TROPONIN I     Status: None   Collection Time    01/05/13  6:13 PM      Result Value Range   Troponin I <0.30  <0.30 ng/mL   Comment:  Due to the  release kinetics of cTnI,     a negative result within the first hours     of the onset of symptoms does not rule out     myocardial infarction with certainty.     If myocardial infarction is still suspected,     repeat the test at appropriate intervals.    Radiological Exams on Admission: Dg Chest Portable 1 View  01/05/2013   *RADIOLOGY REPORT*  Clinical Data: Shortness of breath.  PORTABLE CHEST - 1 VIEW  Comparison: Plain film chest 10/10/2012 and PA and lateral chest 05/19/2012.  Findings: There is cardiomegaly.  Lung volumes are low with subsegmental atelectasis in the bases.  No pneumothorax or pleural effusion.  IMPRESSION: Cardiomegaly and bibasilar subsegmental atelectasis.   Original Report Authenticated By: Holley Dexter, M.D.    Assessment/Plan Active Problems:   Alcohol abuse   Alcohol intoxication   Atrial fibrillation  Alcohol intoxication Patient has long-standing history of alcohol abuse and came with acute alcohol intoxication She required Ativan 1 mg IV in the ED for agitation and at this time she is calm and very somnolent We'll start her on CIWA protocol for alcohol withdrawal Patient was started on banana bag IV fluid  Alcohol abuse Patient has been drinking alcohol for long time Will need counseling and evaluation by social worker when more awake.  Atrial fibrillation Patient was found to be in A. Fib, probably new onset when she came to the ED She converted back to normal sinus rhythm after one dose of Cardizem 10 mg IV x1, did not require Cardizem infusion Will obtain TSH, magnesium level, 2-D echocardiogram to assess the cause of atrial fibrillation Patient will be monitored closely in telemetry First set of cardiac enzymes is negative, we'll cycle cardiac enzymes over next 24 hours  Hypertension Will continue with Lotensin HCT, and also start hydralazine 10 mg IV every 6 hours when necessary  COPD Stable, no signs and symptoms of  exacerbation We'll continue when necessary albuterol nebulizer  History of schizophrenia Continue Zyprexa  DVT prophylaxis SCDs  Code status: Presumed full code  Family discussion: Called and discussed with patient's daughter Laury Axon, who says that patient lives by herself and drinks alcohol almost daily.   Time Spent on Admission: 65 min  Nicole Hafley S [facility] Pager: [phone] 01/05/2013, 8:41 PM  If 7PM-7AM, please contact night-coverage  [email or web address]  Password [email or web address]

## 2013-01-05 NOTE — ED Notes (Signed)
EMS called for difficulty breathing. Pt also has + ETOH upon arrival. Pt able to speak complete sentences without difficulty. Nad.

## 2013-01-06 DIAGNOSIS — R079 Chest pain, unspecified: Secondary | ICD-10-CM | POA: Diagnosis present

## 2013-01-06 DIAGNOSIS — G9341 Metabolic encephalopathy: Secondary | ICD-10-CM | POA: Diagnosis present

## 2013-01-06 LAB — URINALYSIS, ROUTINE W REFLEX MICROSCOPIC
Bilirubin Urine: NEGATIVE
Glucose, UA: NEGATIVE mg/dL
Hgb urine dipstick: NEGATIVE
Ketones, ur: NEGATIVE mg/dL
Protein, ur: NEGATIVE mg/dL
pH: 5.5 (ref 5.0–8.0)

## 2013-01-06 LAB — RAPID URINE DRUG SCREEN, HOSP PERFORMED
Amphetamines: NOT DETECTED
Benzodiazepines: NOT DETECTED
Cocaine: POSITIVE — AB

## 2013-01-06 LAB — TROPONIN I: Troponin I: <0.3 ng/mL (ref <0.30)

## 2013-01-06 MED ORDER — PRO-STAT SUGAR FREE PO LIQD
30.0000 mL | Freq: Three times a day (TID) | ORAL | Status: DC
  Administered 2013-01-06 – 2013-01-07 (×3): 30 mL via ORAL
  Filled 2013-01-06 (×2): qty 30

## 2013-01-06 NOTE — Care Management Note (Unsigned)
    Page 1 of 1   01/06/2013     1:51:44 PM   CARE MANAGEMENT NOTE 01/06/2013  Patient:  Molly Campbell, Molly Campbell   Account Number:  [id number]  Date Initiated:  01/06/2013  Documentation initiated by:  Anibal Henderson  Subjective/Objective Assessment:   admitted from home with ETOH abuse, and + for cocaine. Pt is too drowsy to talk, but isfrom home, and has a sister and daughter listed as available family.     Action/Plan:   Pt will be D/C home when stable.  CSW with see to offer rehab.   Anticipated DC Date:  01/07/2013   Anticipated DC Plan:  HOME/SELF CARE  In-house referral  Clinical Social Worker      DC Planning Services  CM consult      Choice offered to / List presented to:             Status of service:  In process, will continue to follow Medicare Important Message given?   (If response is "NO", the following Medicare IM given date fields will be blank) Date Medicare IM given:   Date Additional Medicare IM given:    Discharge Disposition:    Per UR Regulation:  Reviewed for med. necessity/level of care/duration of stay  If discussed at Long Length of Stay Meetings, dates discussed:    Comments:  01/06/13  1350 Anibal Henderson RN/CM

## 2013-01-06 NOTE — Progress Notes (Signed)
INITIAL NUTRITION ASSESSMENT  DOCUMENTATION CODES Per approved criteria  -Not Applicable   INTERVENTION: ProStat 30 ml TID (each 30 ml provides 100 kcal, 15 gr protein)  NUTRITION DIAGNOSIS: Inadequate oral intake related to alcohol abuse as evidenced by admission hx  Goal: Pt to meet >/= 90% of their estimated nutrition needs   Monitor:  Po intake, labs and wt trends  Reason for Assessment: Malnutrition Screen Score = 5  52 y.o. female   ASSESSMENT: . Patient Active Problem List   Diagnosis Date Noted  . Atrial fibrillation 01/05/2013  . 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 HTN (hypertension) 03/14/2011  . Chest pain 03/14/2011  . Alcohol intoxication 03/14/2011  . Tobacco abuse   . Alcohol abuse   . Cocaine abuse    Pt admitted with alcohol intoxication. She is unable to provide nutrition hx at this time and no family are present. Her lunch tray is still here, her plate is lying on her chest and she is sleeping. Good intake of meal; except her protein. Diet advanced to Regular. She is receiving MVI, folic acid and thiamine. Mild weight gain since January this year but overall wt is fairly stable past 2 years. However, expect she is malnourished in view of her medical/social hx. Will follow her for nutrition care.  Height: Ht Readings from Last 1 Encounters:  01/05/13 5' (1.524 m)    Weight: Wt Readings from Last 1 Encounters:  01/06/13 109 lb 5.6 oz (49.6 kg)    Ideal Body Weight: 100# (45.4 kg)  % Ideal Body Weight: 109%  Wt Readings from Last 10 Encounters:  01/06/13 109 lb 5.6 oz (49.6 kg)  06/26/12 105 lb (47.628 kg)  05/18/12 96 lb (43.545 kg)  05/04/12 105 lb (47.628 kg)  04/11/12 102 lb (46.267 kg)  01/07/12 100 lb (45.36 kg)  10/11/11 100 lb (45.36 kg)  03/31/11 105 lb (47.628 kg)  03/15/11 110 lb 14.3 oz (50.3 kg)  11/21/10 100 lb (45.36 kg)    Usual Body Weight: 100-105#  % Usual Body Weight: 103%  BMI:  Body mass index is 21.36  kg/(m^2).normal range  Estimated Nutritional Needs: Kcal: 1914-7829 Protein:55-65 gr  Fluid: 1700 ml/day  Skin: No issues noted  Diet Order: General  EDUCATION NEEDS: -Education not appropriate at this time   Intake/Output Summary (Last 24 hours) at 01/06/13 1018 Last data filed at 01/06/13 0500  Gross per 24 hour  Intake 576.67 ml  Output    600 ml  Net -23.33 ml    Last BM: PTA  Labs:   Recent Labs Lab 01/05/13 1813 01/05/13 2202  NA 139  --   K 3.6  --   CL 102  --   CO2 21  --   BUN 11  --   CREATININE 0.72  --   CALCIUM 9.1  --   MG  --  1.7  GLUCOSE 75  --     CBG (last 3)  No results found for this basename: GLUCAP,  in the last 72 hours  Scheduled Meds: . benazepril  20 mg Oral Daily   And  . hydrochlorothiazide  12.5 mg Oral Daily  . folic acid  1 mg Oral Daily  . multivitamin with minerals  1 tablet Oral Daily  . OLANZapine  20 mg Oral Daily  . pantoprazole (PROTONIX) IV  40 mg Intravenous Q24H  . raloxifene  60 mg Oral Daily  . thiamine  100 mg Oral Daily   Or  . thiamine  100 mg Intravenous Daily    Continuous Infusions:   Past Medical History  Diagnosis Date  . Hypertension   . Tobacco abuse   . Alcohol abuse   . Cocaine abuse   . 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 Auditory hallucinations   . Anxiety disorder   . Schizophrenia   . COPD (chronic obstructive pulmonary disease)   . Emphysema   . Chronic bronchitis with acute exacerbation     Past Surgical History  Procedure Laterality Date  . Abdominal hysterectomy    . Breast biopsy      Royann Shivers MS,RD,LDN,CSG Office: #[phone] Pager: [phone]

## 2013-01-06 NOTE — Progress Notes (Signed)
[facility] PROGRESS NOTE  Molly Campbell ZOX:[id number] DOB: [birth year] DOA: 01/05/2013 PCP: Toma Deiters, MD  Assessment/Plan: 1. Metabolic encephalopathy secondary to alcohol intoxication. Patient had significantly elevated alcohol on admission. She continues to be lethargic and drowsy. She has not received any sedative medication since arriving to the floor. We'll continue to monitor. 2. Alcohol abuse. We'll ask social work to offer rehabilitation. She is on CIWA protocol 3. Cocaine abuse. 4. Chest pain. Likely related to substance abuse. Cardiac enzymes have been negative. 5. Atrial fibrillation, now in sinus rhythm. Likely related to alcohol intoxication. We'll continue to monitor for recurrence. Cardiac enzymes negative. 2-D echo has been ordered. She's not a candidate for anticoagulation due to alcohol abuse. 6.   Code Status: full code Family Communication: no family present Disposition Plan: discharge home once improve   Consultants:  none  Procedures:  none  Antibiotics:  none  HPI/Subjective: Patient is too lethargic to provide history  Objective: Filed Vitals:   01/06/13 0814  BP: 144/87  Pulse: 103  Temp:   Resp:     Intake/Output Summary (Last 24 hours) at 01/06/13 1404 Last data filed at 01/06/13 0500  Gross per 24 hour  Intake 576.67 ml  Output    600 ml  Net -23.33 ml   Filed Weights   01/05/13 2100 01/06/13 0517  Weight: 49.7 kg (109 lb 9.1 oz) 49.6 kg (109 lb 5.6 oz)    Exam:   General:  NAD  Cardiovascular: S1, s2 RRR  Respiratory: CTA B  Abdomen: soft, nt, nd, bs+  Musculoskeletal: no edema b/l   Data Reviewed: Basic Metabolic Panel:  Recent Labs Lab 01/05/13 1813 01/05/13 2202  NA 139  --   K 3.6  --   CL 102  --   CO2 21  --   GLUCOSE 75  --   BUN 11  --   CREATININE 0.72  --   CALCIUM 9.1  --   MG  --  1.7   Liver Function Tests: No results found for this basename: AST, ALT, ALKPHOS, BILITOT, PROT,  ALBUMIN,  in the last 168 hours No results found for this basename: LIPASE, AMYLASE,  in the last 168 hours No results found for this basename: AMMONIA,  in the last 168 hours CBC:  Recent Labs Lab 01/05/13 1813  WBC 7.2  NEUTROABS 2.3  HGB 13.2  HCT 38.1  MCV 95.3  PLT 381   Cardiac Enzymes:  Recent Labs Lab 01/05/13 1813 01/05/13 2202 01/06/13 0350 01/06/13 0921  TROPONINI <0.30 <0.30 <0.30 <0.30   BNP (last 3 results) No results found for this basename: PROBNP,  in the last 8760 hours CBG: No results found for this basename: GLUCAP,  in the last 168 hours  No results found for this or any previous visit (from the past 240 hour(s)).   Studies: Dg Chest Portable 1 View  01/05/2013   *RADIOLOGY REPORT*  Clinical Data: Shortness of breath.  PORTABLE CHEST - 1 VIEW  Comparison: Plain film chest 10/10/2012 and PA and lateral chest 05/19/2012.  Findings: There is cardiomegaly.  Lung volumes are low with subsegmental atelectasis in the bases.  No pneumothorax or pleural effusion.  IMPRESSION: Cardiomegaly and bibasilar subsegmental atelectasis.   Original Report Authenticated By: Holley Dexter, M.D.    Scheduled Meds: . benazepril  20 mg Oral Daily   And  . hydrochlorothiazide  12.5 mg Oral Daily  . feeding supplement  30 mL Oral TID WC  . folic  acid  1 mg Oral Daily  . multivitamin with minerals  1 tablet Oral Daily  . OLANZapine  20 mg Oral Daily  . pantoprazole (PROTONIX) IV  40 mg Intravenous Q24H  . raloxifene  60 mg Oral Daily  . thiamine  100 mg Oral Daily   Or  . thiamine  100 mg Intravenous Daily   Continuous Infusions:   Active Problems:   HTN (hypertension)   Alcohol abuse   Cocaine abuse   Alcohol intoxication   Atrial fibrillation   Encephalopathy, metabolic   Chest pain    Time spent:    [facility]  [facility] Pager [phone]. If 7PM-7AM, please contact night-coverage at [email or web address], password [facility] 01/06/2013, 2:04 PM   LOS: 1 day

## 2013-01-06 NOTE — Progress Notes (Signed)
Patient had billfold, money and cigarettes/lighter placed with security and purse and cell phone in the room with the patient. Verified with a second nurse since patient is unable to sign at this time.

## 2013-01-06 NOTE — Progress Notes (Signed)
UR chart review completed.  

## 2013-01-07 DIAGNOSIS — I059 Rheumatic mitral valve disease, unspecified: Secondary | ICD-10-CM

## 2013-01-07 DIAGNOSIS — F79 Unspecified intellectual disabilities: Secondary | ICD-10-CM | POA: Diagnosis present

## 2013-01-07 LAB — BASIC METABOLIC PANEL
BUN: 10 mg/dL (ref 6–23)
CO2: 25 mEq/L (ref 19–32)
Calcium: 8.9 mg/dL (ref 8.4–10.5)
GFR calc non Af Amer: 82 mL/min — ABNORMAL LOW (ref >90)
Glucose, Bld: 91 mg/dL (ref 70–99)
Sodium: 140 mEq/L (ref 135–145)

## 2013-01-07 LAB — CBC
HCT: 33 % — ABNORMAL LOW (ref 36.0–46.0)
Hemoglobin: 11.5 g/dL — ABNORMAL LOW (ref 12.0–15.0)
MCH: 33.2 pg (ref 26.0–34.0)
MCHC: 34.8 g/dL (ref 30.0–36.0)
RBC: 3.46 MIL/uL — ABNORMAL LOW (ref 3.87–5.11)

## 2013-01-07 LAB — TSH: TSH: 0.554 u[IU]/mL (ref 0.350–4.500)

## 2013-01-07 MED ORDER — PANTOPRAZOLE SODIUM 40 MG PO TBEC: 40.0000 mg | DELAYED_RELEASE_TABLET | Freq: Every day | ORAL | Status: DC

## 2013-01-07 MED ORDER — GI COCKTAIL ~~LOC~~: 30.0000 mL | Freq: Two times a day (BID) | ORAL | Status: DC | PRN

## 2013-01-07 MED ORDER — POTASSIUM CHLORIDE CRYS ER 20 MEQ PO TBCR
20.0000 meq | EXTENDED_RELEASE_TABLET | Freq: Two times a day (BID) | ORAL | Status: DC
  Administered 2013-01-07: 20 meq via ORAL
  Filled 2013-01-07: qty 1

## 2013-01-07 MED ORDER — PANTOPRAZOLE SODIUM 40 MG PO TBEC
40.0000 mg | DELAYED_RELEASE_TABLET | Freq: Every day | ORAL | Status: DC
  Administered 2013-01-07: 40 mg via ORAL
  Filled 2013-01-07: qty 1

## 2013-01-07 NOTE — Discharge Summary (Signed)
Physician Discharge Summary  Molly Campbell ZOX:[id number] DOB: 07-23-1960 DOA: 01/05/2013  PCP: Molly Deiters, MD  Admit date: 01/05/2013 Discharge date: 01/07/2013  Time spent: 45 minutes  Recommendations for Outpatient Follow-up:  Home Health RN, Home Health Social Worker Follow up with Primary Care physician 7-10 days after discharge  Discharge Diagnoses:  Active Problems:   HTN (hypertension)   Alcohol abuse   Cocaine abuse   Alcohol intoxication   Atrial fibrillation   Encephalopathy, metabolic   Chest pain   Mental retardation   Discharge Condition: stable.  Mental status improved.  Diet recommendation: heart healthy  Filed Weights   01/05/13 2100 01/06/13 0517  Weight: 49.7 kg (109 lb 9.1 oz) 49.6 kg (109 lb 5.6 oz)    History of present illness:  52 year old female with a history of mental retardation, hypertension, alcohol abuse, cocaine abuse, schizophrenia, COPD who was brought to the hospital by EMS for alcohol intoxication in an obtunded state.    Hospital Course:  Metabolic encephalopathy due to alcohol intoxication Alcohol level 384 on admission.  Daughter reports patient will have DTs w/o ETOH Monitored on CIWA protocol. CIWA scores were 3 or less. Mental status cleared significantly, but pt reportedly has MR and is delayed at baseline.  She appears to be back to baseline now  Alcohol abuse Patient counseled to quit, but reports she has no interest in doing so. Patient's daughter (caretaker) made aware alcohol/cocaine abuse are impacting her mother's health and she was advised to seek rehab for her mother. A home health RN and social worker have been ordered.  Cocaine abuse Recurrently Cocaine positive on admission.  Chest pain Troponin Is have been negative. Chest pain is resolved. 2D Echo completed Patient treated with PPI and GI cocktail, possibly related to alcoholic gastritis, may be related to A fib  Atrial fibrillation  Episode of Afib.   Now in sinus rhythm.   Likely related to drug and alcohol abuse.   Not a candidate for anticoagulation due to drug/alcohol abuse.     Consultations:  Social work  Discharge Exam: Filed Vitals:   01/07/13 1336  BP: 168/95  Pulse: 76  Temp: 98.3 F (36.8 C)  Resp: 18    General: NAD Cardiovascular: S1, S2 RRR Respiratory: CTA B  Discharge Instructions     Medication List    ASK your doctor about these medications       albuterol 108 (90 BASE) MCG/ACT inhaler  Commonly known as:  PROVENTIL HFA;VENTOLIN HFA  Inhale 1-2 puffs into the lungs every 6 (six) hours as needed for wheezing.     benazepril-hydrochlorthiazide 20-12.5 MG per tablet  Commonly known as:  LOTENSIN HCT  Take 1 tablet by mouth daily. FOR YOUR HIGH BLOOD PRESSURE.     HYDROcodone-acetaminophen 5-325 MG per tablet  Commonly known as:  NORCO/VICODIN  Take 1 tablet by mouth every 6 (six) hours as needed for pain.     LORazepam 1 MG tablet  Commonly known as:  ATIVAN  Take 1 tablet (1 mg total) by mouth 3 (three) times daily. DO NOT TAKE THIS MEDICINE WITH ALCOHOL.     multivitamins ther. w/minerals Tabs tablet  Take 1 tablet by mouth daily.     OLANZapine 20 MG tablet  Commonly known as:  ZYPREXA  Take 1 tablet (20 mg total) by mouth daily.     raloxifene 60 MG tablet  Commonly known as:  EVISTA  Take 60 mg by mouth daily.     ranitidine  150 MG capsule  Commonly known as:  ZANTAC  Take 1 capsule (150 mg total) by mouth 2 (two) times daily.       No Known Allergies    The results of significant diagnostics from this hospitalization (including imaging, microbiology, ancillary and laboratory) are listed below for reference.    Significant Diagnostic Studies: Dg Chest Portable 1 View  01/05/2013   *RADIOLOGY REPORT*  Clinical Data: Shortness of breath.  PORTABLE CHEST - 1 VIEW  Comparison: Plain film chest 10/10/2012 and PA and lateral chest 05/19/2012.  Findings: There is cardiomegaly.   Lung volumes are low with subsegmental atelectasis in the bases.  No pneumothorax or pleural effusion.  IMPRESSION: Cardiomegaly and bibasilar subsegmental atelectasis.   Original Report Authenticated By: Holley Dexter, M.D.    Labs: Basic Metabolic Panel:  Recent Labs Lab 01/05/13 1813 01/05/13 2202 01/07/13 0551  NA 139  --  140  K 3.6  --  3.3*  CL 102  --  106  CO2 21  --  25  GLUCOSE 75  --  91  BUN 11  --  10  CREATININE 0.72  --  0.80  CALCIUM 9.1  --  8.9  MG  --  1.7  --    CBC:  Recent Labs Lab 01/05/13 1813 01/07/13 0551  WBC 7.2 6.1  NEUTROABS 2.3  --   HGB 13.2 11.5*  HCT 38.1 33.0*  MCV 95.3 95.4  PLT 381 339   Cardiac Enzymes:  Recent Labs Lab 01/05/13 1813 01/05/13 2202 01/06/13 0350 01/06/13 0921  TROPONINI <0.30 <0.30 <0.30 <0.30    Signed:  Stephani Police, PA-C [facility] 01/07/2013, 2:26 PM  Attending note:  Patient seen and examined. Above note reviewed. Patient was admitted with alcohol intoxication, encephalopathy and atrial fibrillation. She was given one bolus of Cardizem in the emergency room and has converted back to sinus rhythm. She's not had any recurrence. 2D echocardiogram was done with results pending. She's not candidate for anticoagulation due to alcohol abuse. I suspect that her atrial fibrillation is related to her alcohol abuse/cocaine use. She is no longer having any chest pain or shortness of breath. This may also be been related to atrial fibrillation versus alcoholic gastritis. The patient has an extensive alcohol history. She is more awake and alert now and does not wish to pursue any alcohol rehabilitation. We have strongly advised the patient as well as the family to have the patient was seen from any further alcohol or substance abuse. The patient is awake, alert, conversing without any difficulty. She ambulated with physical therapy no further physical therapy was needed. She is felt safe to discharge home.  She will be staying with her daughter.  Molly Campbell

## 2013-01-07 NOTE — Progress Notes (Signed)
[facility] PROGRESS NOTE  Molly Campbell ZOX:[id number] DOB: May 07, 1961 DOA: 01/05/2013 PCP: Toma Deiters, MD  52 year old female with a history of mental retardation,  hypertension, alcohol abuse, cocaine abuse, schizophrenia, COPD who was brought to the hospital by EMS for alcohol intoxication in an obtunded state.    Assessment/DL her hospitalPlan: 1. Metabolic encephalopathy secondary to alcohol intoxication. Patient had significantly elevated alcohol on admission. She continues to have slurred speech.  But with her history of MR its hard to sort out what her baseline is.  We'll continue to monitor. 2. Alcohol abuse. We'll ask social work to offer rehabilitation. She is on CIWA protocol.  Per daughter she will go into DTs without alcohol. Will talk with SW about rehab.  At a minimum with order home health SW & RN. 3. Cocaine abuse. 4. Chest pain. Likely related to substance abuse. Cardiac enzymes have been negative. On PPI.  Will add GI cocktail 5. Atrial fibrillation, now in sinus rhythm. Likely related to alcohol intoxication. We'll continue to monitor for recurrence. Cardiac enzymes negative. 2-D echo completed,  result pending.   She's not a candidate for anticoagulation due to alcohol abuse.  Code Status: full code Family Communication: no family present.  Spoke with daughter on the phone Disposition Plan: discharge home once improve  HPI/Subjective: Complaining of intermittent left sided stabbing chest pain.  Objective: Filed Vitals:   01/07/13 0836  BP: 172/86  Pulse:   Temp:   Resp:     Intake/Output Summary (Last 24 hours) at 01/07/13 1234 Last data filed at 01/06/13 1800  Gross per 24 hour  Intake   1300 ml  Output      0 ml  Net   1300 ml   Filed Weights   01/05/13 2100 01/06/13 0517  Weight: 49.7 kg (109 lb 9.1 oz) 49.6 kg (109 lb 5.6 oz)    Exam:   General:  NAD, slurred speech, min. Slow to respond  Cardiovascular: S1, s2 RRR  Respiratory:  CTA B  Abdomen: soft, nt, nd, bs+  Musculoskeletal: no edema b/l.  Weak in all 4 ext. But able to push against resistance  Data Reviewed: Basic Metabolic Panel:  Recent Labs Lab 01/05/13 1813 01/05/13 2202 01/07/13 0551  NA 139  --  140  K 3.6  --  3.3*  CL 102  --  106  CO2 21  --  25  GLUCOSE 75  --  91  BUN 11  --  10  CREATININE 0.72  --  0.80  CALCIUM 9.1  --  8.9  MG  --  1.7  --    CBC:  Recent Labs Lab 01/05/13 1813 01/07/13 0551  WBC 7.2 6.1  NEUTROABS 2.3  --   HGB 13.2 11.5*  HCT 38.1 33.0*  MCV 95.3 95.4  PLT 381 339   Cardiac Enzymes:  Recent Labs Lab 01/05/13 1813 01/05/13 2202 01/06/13 0350 01/06/13 0921  TROPONINI <0.30 <0.30 <0.30 <0.30     Studies: Dg Chest Portable 1 View  01/05/2013   *RADIOLOGY REPORT*  Clinical Data: Shortness of breath.  PORTABLE CHEST - 1 VIEW  Comparison: Plain film chest 10/10/2012 and PA and lateral chest 05/19/2012.  Findings: There is cardiomegaly.  Lung volumes are low with subsegmental atelectasis in the bases.  No pneumothorax or pleural effusion.  IMPRESSION: Cardiomegaly and bibasilar subsegmental atelectasis.   Original Report Authenticated By: Holley Dexter, M.D.    Scheduled Meds: . benazepril  20 mg Oral Daily  And  . hydrochlorothiazide  12.5 mg Oral Daily  . feeding supplement  30 mL Oral TID WC  . folic acid  1 mg Oral Daily  . multivitamin with minerals  1 tablet Oral Daily  . OLANZapine  20 mg Oral Daily  . pantoprazole (PROTONIX) IV  40 mg Intravenous Q24H  . potassium chloride  20 mEq Oral BID  . raloxifene  60 mg Oral Daily  . thiamine  100 mg Oral Daily   Or  . thiamine  100 mg Intravenous Daily   Continuous Infusions:   Active Problems:   HTN (hypertension)   Alcohol abuse   Cocaine abuse   Alcohol intoxication   Atrial fibrillation   Encephalopathy, metabolic   Chest pain   Mental retardation   [place]ley [place]  [facility] Pager [phone]. If 7PM-7AM,  please contact night-coverage at [email or web address], password [facility] 01/07/2013, 12:34 PM  LOS: 2 days

## 2013-01-07 NOTE — Progress Notes (Signed)
*  PRELIMINARY RESULTS* Echocardiogram 2D Echocardiogram has been performed.  Conrad Springdale 01/07/2013, 10:03 AM

## 2013-01-07 NOTE — Clinical Social Work Psychosocial (Signed)
    Clinical Social Work Department BRIEF PSYCHOSOCIAL ASSESSMENT 01/07/2013  Patient:  Molly Campbell, Molly Campbell     Account Number:  [id number]     Admit date:  01/05/2013  Clinical Social Worker:  Santa Genera, CLINICAL SOCIAL WORKER  Date/Time:  01/07/2013 11:00 AM  Referred by:  Physician  Date Referred:  01/07/2013 Referred for  Substance Abuse   Other Referral:   Interview type:  Patient Other interview type:    PSYCHOSOCIAL DATA Living Status:  ALONE Admitted from facility:   Level of care:   Primary support name:  Nurse, children's Primary support relationship to patient:  CHILD, ADULT Degree of support available:   Unclear, patient states she lives alone, has aide, is on disability for "mental retardation"    CURRENT CONCERNS Current Concerns  Substance Abuse   Other Concerns:    SOCIAL WORK ASSESSMENT / PLAN CSW met w patient at bedside, patient oriented x4, patient appeared somewhat groggy and had slurred speech. Administered SBIRT, patient scored a 6 in comsumption, 0 in both dependence and alcohol/substance related problems score.  Patient is on disability - patient says this is due to "mental retardation."  Says she did not complete high school w either a certificate or degree.  Lives alone but has an aide Annice Pih) who comes for "a couple of hours" Monday - Friday.  Patient does not know what agency aide comes from, but patient does not pay for the aide.    Patient says she drinks one 40 oz beer/day "a few days/week."  Says she does not have money for any more alcohol than this.  "Occasionally" she drinks wine.  Mostly drinks w friends.  Denies drinking more than 4 drinks on any one occasion except when she is at a "party at a friend's house."  Admits to smoking $20 of cocaine "once or twice a month."    Patient does not feel that her alcohol or cocaine use are a problem, does not want to stop, refused any information on resources for substance use treatment.  Patient  says that she does not think her drinking or cocaine use are harmful and that "I know not to drink when I am sick."    CSW will contact attending MD to see if patient's medical issues are related to her substance use.  Will revisit w patient once need for substance use treatment is clarified. At present, patient does not feel that her substance use is a problem, and has no desire to stop or cut down.   Assessment/plan status:  Psychosocial Support/Ongoing Assessment of Needs Other assessment/ plan:   Information/referral to community resources:   Administered SBIRT, patient declined all other resources offered (AA meeting list, health effects of alcohol, Rethinking Drinking)    PATIENT'S/FAMILY'S RESPONSE TO PLAN OF CARE: Patient states she is not interested in stopping drinking or using cocaine.        Santa Genera, LCSW Clinical Social Worker [phone])

## 2013-01-07 NOTE — Evaluation (Signed)
Physical Therapy Evaluation Patient Details Name: Molly Campbell MRN: [id number] DOB: 1960-09-13 Today's Date: 01/07/2013 Time: 4098-1191 PT Time Calculation (min): 47 min  PT Assessment / Plan / Recommendation History of Present Illness  Pt is admitted with altered mental status.  She had a high blood alcohol level at time of admission.  Today, she is awake, oriented and cooperative.  She lives alone in an apartment and has an aide 2 hours/day, 5 days/week.  She does not use any assistive device for gait.  Clinical Impression   Pt was seen for evaluation and found to be at prior functional level.  She is independent with all mobility.    PT Assessment  Patent does not need any further PT services    Follow Up Recommendations  No PT follow up    Does the patient have the potential to tolerate intense rehabilitation      Barriers to Discharge        Equipment Recommendations  None recommended by PT    Recommendations for Other Services     Frequency      Precautions / Restrictions Precautions Precautions: None Restrictions Weight Bearing Restrictions: No   Pertinent Vitals/Pain       Mobility  Bed Mobility Bed Mobility: Supine to Sit;Sit to Supine Supine to Sit: 7: Independent Sit to Supine: 7: Independent Transfers Transfers: Sit to Stand;Stand to Sit Sit to Stand: 7: Independent Stand to Sit: 7: Independent Ambulation/Gait Ambulation/Gait Assistance: 7: Independent Ambulation Distance (Feet): 175 Feet Assistive device: None Gait Pattern: Within Functional Limits Gait velocity: WNL Stairs: No Wheelchair Mobility Wheelchair Mobility: No    Exercises     PT Diagnosis:    PT Problem List:   PT Treatment Interventions:       PT Goals(Current goals can be found in the care plan section) Acute Rehab PT Goals PT Goal Formulation: No goals set, d/c therapy  Visit Information  Last PT Received On: 01/07/13 History of Present Illness: Pt is admitted  with altered mental status.  She had a high blood alcohol level at time of admission.  Today, she is awake, oriented and cooperative.  She lives alone in an apartment and has an aide 2 hours/day, 5 days/week.  She does not use any assistive device for gait.       Prior Functioning  Home Living Family/patient expects to be discharged to:: Private residence Living Arrangements: Alone Available Help at Discharge: Personal care attendant;Family;Available PRN/intermittently Type of Home: Apartment Home Access: Level entry;Elevator Home Layout: One level Home Equipment: None Prior Function Level of Independence: Needs assistance Gait / Transfers Assistance Needed: independent ADL's / Homemaking Assistance Needed: minimal assist with bath Communication Communication: No difficulties    Cognition  Cognition Arousal/Alertness: Awake/alert Behavior During Therapy: WFL for tasks assessed/performed Overall Cognitive Status: Within Functional Limits for tasks assessed    Extremity/Trunk Assessment Upper Extremity Assessment Upper Extremity Assessment: Overall WFL for tasks assessed Lower Extremity Assessment Lower Extremity Assessment: Overall WFL for tasks assessed   Balance Balance Balance Assessed: Yes High Level Balance High Level Balance Activites: Side stepping;Backward walking;Direction changes;Sudden stops;Head turns;Turns High Level Balance Comments: no LOB with above  End of Session PT - End of Session Equipment Utilized During Treatment: Gait belt Activity Tolerance: Patient tolerated treatment well Patient left: in bed;with bed alarm set;with call bell/phone within reach  GP     Myrlene Broker L 01/07/2013, 2:47 PM

## 2013-01-07 NOTE — Clinical Social Work Note (Signed)
Patient agreeable to CSW talking w aide, Annice Pih.  Gave CSW [place] phone - spoke w Annice Pih.  Says she works for [employer] and is assigned to patient due to her developmental disabilities.  Says patient's IQ is "real low", she left school at 8th grade, estimates reading/writing at 6th grade level.  Has worked w patient 5 - 6 years.  Comes Monday - Friday for 1 - 2 hours/day, assists w home health needs, housekeeping, personal care including bathing.  Says patient receives disability, daughter Debbe Odea is her rep payee.  Daughter and patient get along.  Patient lives in [place], is unhappy and isolated in her current housing situation but cannot get out of lease until end of year.  Aide is working on getting patient into subsidized housing in [place] so she can be closer to her daughter.  Expressed no concerns about patient returning home, however did mention she was concerned about patient smoking and says she "drinks a little bit."  Aide is coming to visit patient at [facility] soon.  Santa Genera, LCSW Clinical Social Worker [phone])

## 2013-01-07 NOTE — Progress Notes (Signed)
The patient is receiving Protonix, thiamine by the intravenous route.  Based on criteria approved by the Pharmacy and Therapeutics Committee and the Medical Executive Committee, the medication is being converted to the equivalent oral dose form.  These criteria include: -No Active GI bleeding -Able to tolerate diet of full liquids (or better) or tube feeding OR able to tolerate other medications by the oral or enteral route  If you have any questions about this conversion, please contact the Pharmacy Department (ext [phone]).  Thank you.  Elson Clan, [facility] 01/07/2013 2:06 PM

## 2013-01-07 NOTE — Progress Notes (Signed)
Patient being discharged home with family. IV cath removed and intact. Verbalizes understanding of instructions. No pain/swelling at site.

## 2013-01-08 NOTE — Progress Notes (Signed)
Patient seen and examined.   Please refer to discharge summary from later today for further details.  Molly Campbell

## 2013-01-14 DIAGNOSIS — R0602 Shortness of breath: Secondary | ICD-10-CM

## 2013-02-10 ENCOUNTER — Other Ambulatory Visit (HOSPITAL_COMMUNITY): Payer: Self-pay | Admitting: Internal Medicine

## 2013-02-10 DIAGNOSIS — Z139 Encounter for screening, unspecified: Secondary | ICD-10-CM

## 2013-02-18 ENCOUNTER — Ambulatory Visit (HOSPITAL_COMMUNITY)
Admission: RE | Admit: 2013-02-18 | Discharge: 2013-02-18 | Disposition: A | Discharge status: Home / Self Care | Payer: Medicaid Other | Source: Ambulatory Visit | Attending: Internal Medicine | Admitting: Internal Medicine

## 2013-02-18 ENCOUNTER — Ambulatory Visit (HOSPITAL_COMMUNITY): Payer: Medicaid Other

## 2013-02-18 DIAGNOSIS — Z139 Encounter for screening, unspecified: Secondary | ICD-10-CM

## 2013-02-18 DIAGNOSIS — Z1231 Encounter for screening mammogram for malignant neoplasm of breast: Secondary | ICD-10-CM | POA: Insufficient documentation

## 2013-06-21 ENCOUNTER — Emergency Department (HOSPITAL_COMMUNITY)
Admission: EM | Admit: 2013-06-21 | Discharge: 2013-06-21 | Disposition: A | Discharge status: Home / Self Care | Payer: Medicaid Other | Attending: Emergency Medicine | Admitting: Emergency Medicine

## 2013-06-21 ENCOUNTER — Encounter (HOSPITAL_COMMUNITY): Payer: Self-pay | Admitting: Emergency Medicine

## 2013-06-21 DIAGNOSIS — F1410 Cocaine abuse, uncomplicated: Secondary | ICD-10-CM

## 2013-06-21 DIAGNOSIS — F1010 Alcohol abuse, uncomplicated: Secondary | ICD-10-CM | POA: Insufficient documentation

## 2013-06-21 DIAGNOSIS — Z8719 Personal history of other diseases of the digestive system: Secondary | ICD-10-CM | POA: Insufficient documentation

## 2013-06-21 DIAGNOSIS — Z79899 Other long term (current) drug therapy: Secondary | ICD-10-CM | POA: Insufficient documentation

## 2013-06-21 DIAGNOSIS — J441 Chronic obstructive pulmonary disease with (acute) exacerbation: Secondary | ICD-10-CM | POA: Insufficient documentation

## 2013-06-21 DIAGNOSIS — F10929 Alcohol use, unspecified with intoxication, unspecified: Secondary | ICD-10-CM

## 2013-06-21 DIAGNOSIS — F17200 Nicotine dependence, unspecified, uncomplicated: Secondary | ICD-10-CM | POA: Insufficient documentation

## 2013-06-21 DIAGNOSIS — M545 Low back pain, unspecified: Secondary | ICD-10-CM | POA: Insufficient documentation

## 2013-06-21 DIAGNOSIS — F209 Schizophrenia, unspecified: Secondary | ICD-10-CM | POA: Insufficient documentation

## 2013-06-21 DIAGNOSIS — I10 Essential (primary) hypertension: Secondary | ICD-10-CM | POA: Insufficient documentation

## 2013-06-21 DIAGNOSIS — F411 Generalized anxiety disorder: Secondary | ICD-10-CM | POA: Insufficient documentation

## 2013-06-21 LAB — CBC WITH DIFFERENTIAL/PLATELET
Basophils Absolute: 0.1 10*3/uL (ref 0.0–0.1)
Basophils Relative: 1 % (ref 0–1)
EOS ABS: 0.1 10*3/uL (ref 0.0–0.7)
EOS PCT: 1 % (ref 0–5)
HCT: 41.1 % (ref 36.0–46.0)
Hemoglobin: 14.3 g/dL (ref 12.0–15.0)
LYMPHS ABS: 3.7 10*3/uL (ref 0.7–4.0)
Lymphocytes Relative: 71 % — ABNORMAL HIGH (ref 12–46)
MCH: 33.6 pg (ref 26.0–34.0)
MCHC: 34.8 g/dL (ref 30.0–36.0)
MCV: 96.5 fL (ref 78.0–100.0)
Monocytes Absolute: 0.2 10*3/uL (ref 0.1–1.0)
Monocytes Relative: 3 % (ref 3–12)
Neutro Abs: 1.2 10*3/uL — ABNORMAL LOW (ref 1.7–7.7)
Neutrophils Relative %: 23 % — ABNORMAL LOW (ref 43–77)
PLATELETS: 406 10*3/uL — AB (ref 150–400)
RBC: 4.26 MIL/uL (ref 3.87–5.11)
RDW: 14.5 % (ref 11.5–15.5)
WBC: 5.1 10*3/uL (ref 4.0–10.5)

## 2013-06-21 LAB — COMPREHENSIVE METABOLIC PANEL
ALT: 17 U/L (ref 0–35)
AST: 29 U/L (ref 0–37)
Albumin: 4.2 g/dL (ref 3.5–5.2)
Alkaline Phosphatase: 94 U/L (ref 39–117)
BUN: 8 mg/dL (ref 6–23)
CALCIUM: 8.8 mg/dL (ref 8.4–10.5)
CO2: 25 mEq/L (ref 19–32)
Chloride: 102 mEq/L (ref 96–112)
Creatinine, Ser: 0.74 mg/dL (ref 0.50–1.10)
GFR calc non Af Amer: >90 mL/min (ref >90)
GLUCOSE: 78 mg/dL (ref 70–99)
Potassium: 3.7 mEq/L (ref 3.7–5.3)
SODIUM: 143 meq/L (ref 137–147)
TOTAL PROTEIN: 7.8 g/dL (ref 6.0–8.3)
Total Bilirubin: 0.3 mg/dL (ref 0.3–1.2)

## 2013-06-21 LAB — ETHANOL: Alcohol, Ethyl (B): 351 mg/dL — ABNORMAL HIGH (ref 0–11)

## 2013-06-21 LAB — URINALYSIS, ROUTINE W REFLEX MICROSCOPIC
Bilirubin Urine: NEGATIVE
Glucose, UA: NEGATIVE mg/dL
Hgb urine dipstick: NEGATIVE
Ketones, ur: NEGATIVE mg/dL
LEUKOCYTES UA: NEGATIVE
Nitrite: NEGATIVE
PROTEIN: NEGATIVE mg/dL
Specific Gravity, Urine: 1.02 (ref 1.005–1.030)
UROBILINOGEN UA: 0.2 mg/dL (ref 0.0–1.0)
pH: 5 (ref 5.0–8.0)

## 2013-06-21 LAB — LIPASE, BLOOD: LIPASE: 36 U/L (ref 11–59)

## 2013-06-21 LAB — RAPID URINE DRUG SCREEN, HOSP PERFORMED
Amphetamines: NOT DETECTED
Barbiturates: NOT DETECTED
Benzodiazepines: NOT DETECTED
Cocaine: POSITIVE — AB
OPIATES: NOT DETECTED
Tetrahydrocannabinol: NOT DETECTED

## 2013-06-21 MED ORDER — KETOROLAC TROMETHAMINE 30 MG/ML IJ SOLN
30.0000 mg | Freq: Once | INTRAMUSCULAR | Status: AC
  Administered 2013-06-21: 30 mg via INTRAVENOUS
  Filled 2013-06-21: qty 1

## 2013-06-21 NOTE — ED Notes (Signed)
Spoke with a friend named Kennyth Lose who advised that she would be able to pick pt up after 9am today, breakfast tray ordered for pt,

## 2013-06-21 NOTE — ED Notes (Signed)
Sleeping soundly 

## 2013-06-21 NOTE — ED Notes (Signed)
Made comfortable with warm blankets, given sprite to drink

## 2013-06-21 NOTE — ED Notes (Signed)
Pt expressed understanding of discharge instructions, pt instructed to wait in er waiting room until her ride Ladd arrives.

## 2013-06-21 NOTE — ED Notes (Signed)
Pt ambulatory to restroom without assistance, pt tolerated well, speech clearer, able to understand pt,

## 2013-06-21 NOTE — ED Notes (Signed)
Pt's speech still thick and hard to understand. I have tried her daughters phone number and left our number for call back.

## 2013-06-21 NOTE — Discharge Instructions (Signed)
Take Ibuprofen or Naproxen as needed for pain.   Alcohol Intoxication Alcohol intoxication occurs when the amount of alcohol that a person has consumed impairs his or her ability to mentally and physically function. Alcohol directly impairs the normal chemical activity of the brain. Drinking large amounts of alcohol can lead to changes in mental function and behavior, and it can cause many physical effects that can be harmful.  Alcohol intoxication can range in severity from mild to very severe. Various factors can affect the level of intoxication that occurs, such as the person's age, gender, weight, frequency of alcohol consumption, and the presence of other medical conditions (such as diabetes, seizures, or heart conditions). Dangerous levels of alcohol intoxication may occur when people drink large amounts of alcohol in a short period (binge drinking). Alcohol can also be especially dangerous when combined with certain prescription medicines or "recreational" drugs. SIGNS AND SYMPTOMS Some common signs and symptoms of mild alcohol intoxication include:  Loss of coordination.  Changes in mood and behavior.  Impaired judgment.  Slurred speech. As alcohol intoxication progresses to more severe levels, other signs and symptoms will appear. These may include:  Vomiting.  Confusion and impaired memory.  Slowed breathing.  Seizures.  Loss of consciousness. DIAGNOSIS  Your health care provider will take a medical history and perform a physical exam. You will be asked about the amount and type of alcohol you have consumed. Blood tests will be done to measure the concentration of alcohol in your blood. In many places, your blood alcohol level must be lower than 80 mg/dL (0.08%) to legally drive. However, many dangerous effects of alcohol can occur at much lower levels.  TREATMENT  People with alcohol intoxication often do not require treatment. Most of the effects of alcohol intoxication are  temporary, and they go away as the alcohol naturally leaves the body. Your health care provider will monitor your condition until you are stable enough to go home. Fluids are sometimes given through an IV access tube to help prevent dehydration.  HOME CARE INSTRUCTIONS  Do not drive after drinking alcohol.  Stay hydrated. Drink enough water and fluids to keep your urine clear or pale yellow. Avoid caffeine.   Only take over-the-counter or prescription medicines as directed by your health care provider.  SEEK MEDICAL CARE IF:   You have persistent vomiting.   You do not feel better after a few days.  You have frequent alcohol intoxication. Your health care provider can help determine if you should see a substance use treatment counselor. SEEK IMMEDIATE MEDICAL CARE IF:   You become shaky or tremble when you try to stop drinking.   You shake uncontrollably (seizure).   You throw up (vomit) blood. This may be bright red or may look like black coffee grounds.   You have blood in your stool. This may be bright red or may appear as a black, tarry, bad smelling stool.   You become lightheaded or faint.  MAKE SURE YOU:   Understand these instructions.  Will watch your condition.  Will get help right away if you are not doing well or get worse. Document Released: 02/01/2005 Document Revised: 12/25/2012 Document Reviewed: 09/27/2012 [facility] Patient Information 2014 [place].  Back Pain, Adult Low back pain is very common. About 1 in 5 people have back pain.The cause of low back pain is rarely dangerous. The pain often gets better over time.About half of people with a sudden onset of back pain feel better in  just 2 weeks. About 8 in 10 people feel better by 6 weeks.  CAUSES Some common causes of back pain include:  Strain of the muscles or ligaments supporting the spine.  Wear and tear (degeneration) of the spinal discs.  Arthritis.  Direct injury to the  back. DIAGNOSIS Most of the time, the direct cause of low back pain is not known.However, back pain can be treated effectively even when the exact cause of the pain is unknown.Answering your caregiver's questions about your overall health and symptoms is one of the most accurate ways to make sure the cause of your pain is not dangerous. If your caregiver needs more information, he or she may order lab work or imaging tests (X-rays or MRIs).However, even if imaging tests show changes in your back, this usually does not require surgery. HOME CARE INSTRUCTIONS For many people, back pain returns.Since low back pain is rarely dangerous, it is often a condition that people can learn to [facility] their own.   Remain active. It is stressful on the back to sit or stand in one place. Do not sit, drive, or stand in one place for more than 30 minutes at a time. Take short walks on level surfaces as soon as pain allows.Try to increase the length of time you walk each day.  Do not stay in bed.Resting more than 1 or 2 days can delay your recovery.  Do not avoid exercise or work.Your body is made to move.It is not dangerous to be active, even though your back may hurt.Your back will likely heal faster if you return to being active before your pain is gone.  Pay attention to your body when you bend and lift. Many people have less discomfortwhen lifting if they bend their knees, keep the load close to their bodies,and avoid twisting. Often, the most comfortable positions are those that put less stress on your recovering back.  Find a comfortable position to sleep. Use a firm mattress and lie on your side with your knees slightly bent. If you lie on your back, put a pillow under your knees.  Only take over-the-counter or prescription medicines as directed by your caregiver. Over-the-counter medicines to reduce pain and inflammation are often the most helpful.Your caregiver may prescribe muscle relaxant  drugs.These medicines help dull your pain so you can more quickly return to your normal activities and healthy exercise.  Put ice on the injured area.  Put ice in a plastic bag.  Place a towel between your skin and the bag.  Leave the ice on for 15-20 minutes, 03-04 times a day for the first 2 to 3 days. After that, ice and heat may be alternated to reduce pain and spasms.  Ask your caregiver about trying back exercises and gentle massage. This may be of some benefit.  Avoid feeling anxious or stressed.Stress increases muscle tension and can worsen back pain.It is important to recognize when you are anxious or stressed and learn ways to manage it.Exercise is a great option. SEEK MEDICAL CARE IF:  You have pain that is not relieved with rest or medicine.  You have pain that does not improve in 1 week.  You have new symptoms.  You are generally not feeling well. SEEK IMMEDIATE MEDICAL CARE IF:   You have pain that radiates from your back into your legs.  You develop new bowel or bladder control problems.  You have unusual weakness or numbness in your arms or legs.  You develop nausea or vomiting.  You develop abdominal pain.  You feel faint. Document Released: 04/24/2005 Document Revised: 10/24/2011 Document Reviewed: 09/12/2010 [facility] Patient Information 2014 [place], [place].

## 2013-06-21 NOTE — ED Provider Notes (Signed)
CSN: [id number]     Arrival date & time 06/21/13  0303 History   None    Chief Complaint  Patient presents with  . Back Pain     (Consider location/radiation/quality/duration/timing/severity/associated sxs/prior Treatment) The history is provided by the patient. The history is limited by the condition of the patient (Intoxicated).   53 year old female complains of low back pain which she states she has had for the last 10 years. She cannot say what is different about it other than she chose to come to the ED tonight. She admits to having been drinking tonight but is very vague about the amount. She has a overtly intoxicated with slurred speech and slurred speech and unsteady gait.  Past Medical History  Diagnosis Date  . Hypertension   . Tobacco abuse   . Alcohol abuse   . Cocaine abuse   . 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 Auditory hallucinations   . Anxiety disorder   . Schizophrenia   . COPD (chronic obstructive pulmonary disease)   . Emphysema   . Chronic bronchitis with acute exacerbation    Past Surgical History  Procedure Laterality Date  . Abdominal hysterectomy    . Breast biopsy     Family History  Problem Relation Age of Onset  . Heart failure Other    History  Substance Use Topics  . Smoking status: Current Every Day Smoker -- 2.00 packs/day    Types: Cigarettes  . Smokeless tobacco: Current User  . Alcohol Use: 1.2 oz/week    2 Cans of beer per week   OB History   Grav Para Term Preterm Abortions TAB SAB Ect Mult Living   3 3 3       3      Review of Systems  Unable to perform ROS: Other      Allergies  Review of patient's allergies indicates no known allergies.  Home Medications   Current Outpatient Rx  Name  Route  Sig  Dispense  Refill  . albuterol (PROVENTIL HFA;VENTOLIN HFA) 108 (90 BASE) MCG/ACT inhaler   Inhalation   Inhale 1-2 puffs into the lungs every 6 (six) hours as needed for wheezing.   1 Inhaler   0   .  benazepril-hydrochlorthiazide (LOTENSIN HCT) 20-12.5 MG per tablet   Oral   Take 1 tablet by mouth daily. FOR YOUR HIGH BLOOD PRESSURE.   30 tablet   2   . HYDROcodone-acetaminophen (NORCO/VICODIN) 5-325 MG per tablet   Oral   Take 1 tablet by mouth every 6 (six) hours as needed for pain.         Marland Kitchen LORazepam (ATIVAN) 1 MG tablet   Oral   Take 1 tablet (1 mg total) by mouth 3 (three) times daily. DO NOT TAKE THIS MEDICINE WITH ALCOHOL.         . Multiple Vitamins-Minerals (MULTIVITAMINS THER. W/MINERALS) TABS   Oral   Take 1 tablet by mouth daily.   30 each      . OLANZapine (ZYPREXA) 20 MG tablet   Oral   Take 1 tablet (20 mg total) by mouth daily.   30 tablet   1   . pantoprazole (PROTONIX) 40 MG tablet   Oral   Take 1 tablet (40 mg total) by mouth daily.   30 tablet   1   . raloxifene (EVISTA) 60 MG tablet   Oral   Take 60 mg by mouth daily.           Marland Kitchen EXPIRED:  ranitidine (ZANTAC) 150 MG capsule   Oral   Take 1 capsule (150 mg total) by mouth 2 (two) times daily.   60 capsule   0    BP 139/101  Pulse 84  Temp(Src) 98.2 F (36.8 C) (Oral)  Resp 16  Ht 5\' 2"  (1.575 m)  Wt 107 lb (48.535 kg)  BMI 19.57 kg/m2  SpO2 95% Physical Exam  Nursing note and vitals reviewed.  53 year old female, resting comfortably and in no acute distress. Vital signs are significant for hypertension with blood pressure 139/101. Oxygen saturation is 95%, which is normal. Head is normocephalic and atraumatic. PERRLA, EOMI. Oropharynx is clear. Neck is nontender and supple without adenopathy or JVD. Back is moderately tender in the lumbar area without point tenderness. There is no CVA tenderness. Lungs are clear without rales, wheezes, or rhonchi. Chest is nontender. Heart has regular rate and rhythm without murmur. Abdomen is soft, flat, nontender without masses or hepatosplenomegaly and peristalsis is normoactive. Extremities have no cyanosis or edema, full range of motion  is present. Skin is warm and dry without rash. Neurologic: Mental status is as noted above. She is clinically intoxicated, cranial nerves are intact, there are no motor or sensory deficits. Gait is somewhat unsteady consistent with alcohol intoxication.  ED Course  Procedures (including critical care time) Labs Review Results for orders placed during the hospital encounter of 06/21/13  CBC WITH DIFFERENTIAL      Result Value Ref Range   WBC 5.1  4.0 - 10.5 K/uL   RBC 4.26  3.87 - 5.11 MIL/uL   Hemoglobin 14.3  12.0 - 15.0 g/dL   HCT 41.1  36.0 - 46.0 %   MCV 96.5  78.0 - 100.0 fL   MCH 33.6  26.0 - 34.0 pg   MCHC 34.8  30.0 - 36.0 g/dL   RDW 14.5  11.5 - 15.5 %   Platelets 406 (*) 150 - 400 K/uL   Neutrophils Relative % 23 (*) 43 - 77 %   Neutro Abs 1.2 (*) 1.7 - 7.7 K/uL   Lymphocytes Relative 71 (*) 12 - 46 %   Lymphs Abs 3.7  0.7 - 4.0 K/uL   Monocytes Relative 3  3 - 12 %   Monocytes Absolute 0.2  0.1 - 1.0 K/uL   Eosinophils Relative 1  0 - 5 %   Eosinophils Absolute 0.1  0.0 - 0.7 K/uL   Basophils Relative 1  0 - 1 %   Basophils Absolute 0.1  0.0 - 0.1 K/uL  COMPREHENSIVE METABOLIC PANEL      Result Value Ref Range   Sodium 143  137 - 147 mEq/L   Potassium 3.7  3.7 - 5.3 mEq/L   Chloride 102  96 - 112 mEq/L   CO2 25  19 - 32 mEq/L   Glucose, Bld 78  70 - 99 mg/dL   BUN 8  6 - 23 mg/dL   Creatinine, Ser 0.74  0.50 - 1.10 mg/dL   Calcium 8.8  8.4 - 10.5 mg/dL   Total Protein 7.8  6.0 - 8.3 g/dL   Albumin 4.2  3.5 - 5.2 g/dL   AST 29  0 - 37 U/L   ALT 17  0 - 35 U/L   Alkaline Phosphatase 94  39 - 117 U/L   Total Bilirubin 0.3  0.3 - 1.2 mg/dL   GFR calc non Af Amer >90  >90 mL/min   GFR calc Af Amer >90  >90  mL/min  LIPASE, BLOOD      Result Value Ref Range   Lipase 36  11 - 59 U/L  ETHANOL      Result Value Ref Range   Alcohol, Ethyl (B) 351 (*) 0 - 11 mg/dL  URINALYSIS, ROUTINE W REFLEX MICROSCOPIC      Result Value Ref Range   Color, Urine YELLOW  YELLOW    APPearance CLEAR  CLEAR   Specific Gravity, Urine 1.020  1.005 - 1.030   pH 5.0  5.0 - 8.0   Glucose, UA NEGATIVE  NEGATIVE mg/dL   Hgb urine dipstick NEGATIVE  NEGATIVE   Bilirubin Urine NEGATIVE  NEGATIVE   Ketones, ur NEGATIVE  NEGATIVE mg/dL   Protein, ur NEGATIVE  NEGATIVE mg/dL   Urobilinogen, UA 0.2  0.0 - 1.0 mg/dL   Nitrite NEGATIVE  NEGATIVE   Leukocytes, UA NEGATIVE  NEGATIVE  URINE RAPID DRUG SCREEN (HOSP PERFORMED)      Result Value Ref Range   Opiates NONE DETECTED  NONE DETECTED   Cocaine POSITIVE (*) NONE DETECTED   Benzodiazepines NONE DETECTED  NONE DETECTED   Amphetamines NONE DETECTED  NONE DETECTED   Tetrahydrocannabinol NONE DETECTED  NONE DETECTED   Barbiturates NONE DETECTED  NONE DETECTED   Intoxicated patient with complaints of back pain. Old records of been reviewed and she has multiple visits for alcohol-related issues but no ED visits for back pain and no imaging of her lumbar area. She'll be given a dose of ketorolac and screening labs obtained.  Following the above-noted to treatment, patient is sleeping soundly. Alcohol level is come back at 328. Drug screen is positive for cocaine. She will be discharged with instructions to use over-the-counter NSAIDs as needed.  Delora Fuel, MD [id number]

## 2013-06-21 NOTE — ED Notes (Signed)
Breakfast tray given to pt,

## 2013-06-21 NOTE — ED Notes (Signed)
Pt resting with eyes closed, aroused when spoken to, pt still attempting to find a ride home,

## 2013-08-02 ENCOUNTER — Emergency Department (HOSPITAL_COMMUNITY): Payer: Medicaid Other

## 2013-08-02 ENCOUNTER — Encounter (HOSPITAL_COMMUNITY): Payer: Self-pay | Admitting: Emergency Medicine

## 2013-08-02 ENCOUNTER — Emergency Department (HOSPITAL_COMMUNITY)
Admission: EM | Admit: 2013-08-02 | Discharge: 2013-08-02 | Disposition: A | Discharge status: Home / Self Care | Payer: Medicaid Other | Attending: Emergency Medicine | Admitting: Emergency Medicine

## 2013-08-02 DIAGNOSIS — I10 Essential (primary) hypertension: Secondary | ICD-10-CM | POA: Insufficient documentation

## 2013-08-02 DIAGNOSIS — R079 Chest pain, unspecified: Secondary | ICD-10-CM

## 2013-08-02 DIAGNOSIS — R0789 Other chest pain: Secondary | ICD-10-CM | POA: Insufficient documentation

## 2013-08-02 DIAGNOSIS — Z8719 Personal history of other diseases of the digestive system: Secondary | ICD-10-CM | POA: Insufficient documentation

## 2013-08-02 DIAGNOSIS — J40 Bronchitis, not specified as acute or chronic: Secondary | ICD-10-CM

## 2013-08-02 DIAGNOSIS — R209 Unspecified disturbances of skin sensation: Secondary | ICD-10-CM | POA: Insufficient documentation

## 2013-08-02 DIAGNOSIS — F17200 Nicotine dependence, unspecified, uncomplicated: Secondary | ICD-10-CM | POA: Insufficient documentation

## 2013-08-02 DIAGNOSIS — Z79899 Other long term (current) drug therapy: Secondary | ICD-10-CM | POA: Insufficient documentation

## 2013-08-02 DIAGNOSIS — F411 Generalized anxiety disorder: Secondary | ICD-10-CM | POA: Insufficient documentation

## 2013-08-02 DIAGNOSIS — J438 Other emphysema: Secondary | ICD-10-CM | POA: Insufficient documentation

## 2013-08-02 LAB — URINALYSIS, ROUTINE W REFLEX MICROSCOPIC
BILIRUBIN URINE: NEGATIVE
Glucose, UA: NEGATIVE mg/dL
Hgb urine dipstick: NEGATIVE
KETONES UR: NEGATIVE mg/dL
Leukocytes, UA: NEGATIVE
NITRITE: NEGATIVE
PROTEIN: NEGATIVE mg/dL
SPECIFIC GRAVITY, URINE: 1.025 (ref 1.005–1.030)
UROBILINOGEN UA: 0.2 mg/dL (ref 0.0–1.0)
pH: 6 (ref 5.0–8.0)

## 2013-08-02 LAB — CBC WITH DIFFERENTIAL/PLATELET
Basophils Absolute: 0.1 10*3/uL (ref 0.0–0.1)
Basophils Relative: 1 % (ref 0–1)
EOS ABS: 0.1 10*3/uL (ref 0.0–0.7)
EOS PCT: 1 % (ref 0–5)
HCT: 39.5 % (ref 36.0–46.0)
HEMOGLOBIN: 13.7 g/dL (ref 12.0–15.0)
LYMPHS ABS: 2.6 10*3/uL (ref 0.7–4.0)
Lymphocytes Relative: 54 % — ABNORMAL HIGH (ref 12–46)
MCH: 33.7 pg (ref 26.0–34.0)
MCHC: 34.7 g/dL (ref 30.0–36.0)
MCV: 97.3 fL (ref 78.0–100.0)
MONOS PCT: 7 % (ref 3–12)
Monocytes Absolute: 0.3 10*3/uL (ref 0.1–1.0)
NEUTROS PCT: 37 % — AB (ref 43–77)
Neutro Abs: 1.8 10*3/uL (ref 1.7–7.7)
Platelets: 322 10*3/uL (ref 150–400)
RBC: 4.06 MIL/uL (ref 3.87–5.11)
RDW: 13.3 % (ref 11.5–15.5)
WBC: 4.9 10*3/uL (ref 4.0–10.5)

## 2013-08-02 LAB — RAPID URINE DRUG SCREEN, HOSP PERFORMED
Amphetamines: NOT DETECTED
BARBITURATES: NOT DETECTED
Benzodiazepines: NOT DETECTED
COCAINE: NOT DETECTED
Opiates: NOT DETECTED
Tetrahydrocannabinol: NOT DETECTED

## 2013-08-02 LAB — COMPREHENSIVE METABOLIC PANEL
ALK PHOS: 85 U/L (ref 39–117)
ALT: 23 U/L (ref 0–35)
AST: 29 U/L (ref 0–37)
Albumin: 3.9 g/dL (ref 3.5–5.2)
BUN: 14 mg/dL (ref 6–23)
CO2: 27 meq/L (ref 19–32)
Calcium: 9.6 mg/dL (ref 8.4–10.5)
Chloride: 100 mEq/L (ref 96–112)
Creatinine, Ser: 0.7 mg/dL (ref 0.50–1.10)
GLUCOSE: 139 mg/dL — AB (ref 70–99)
POTASSIUM: 3.5 meq/L — AB (ref 3.7–5.3)
Sodium: 139 mEq/L (ref 137–147)
TOTAL PROTEIN: 7.9 g/dL (ref 6.0–8.3)
Total Bilirubin: 0.4 mg/dL (ref 0.3–1.2)

## 2013-08-02 LAB — ETHANOL

## 2013-08-02 MED ORDER — NAPROXEN 375 MG PO TABS: 375.0000 mg | ORAL_TABLET | Freq: Two times a day (BID) | ORAL | Status: DC

## 2013-08-02 MED ORDER — AMOXICILLIN 500 MG PO CAPS: 500.0000 mg | ORAL_CAPSULE | Freq: Three times a day (TID) | ORAL | Status: DC

## 2013-08-02 MED ORDER — SODIUM CHLORIDE 0.9 % IV SOLN
INTRAVENOUS | Status: DC
  Administered 2013-08-02: 15:00:00 via INTRAVENOUS

## 2013-08-02 MED ORDER — KETOROLAC TROMETHAMINE 30 MG/ML IJ SOLN
30.0000 mg | Freq: Once | INTRAMUSCULAR | Status: AC
  Administered 2013-08-02: 30 mg via INTRAVENOUS
  Filled 2013-08-02: qty 1

## 2013-08-02 MED ORDER — AMLODIPINE BESYLATE 10 MG PO TABS: 10.0000 mg | ORAL_TABLET | Freq: Every day | ORAL | Status: DC

## 2013-08-02 NOTE — ED Provider Notes (Signed)
CSN: [id number]     Arrival date & time 08/02/13  1332 History  This chart was scribed for Molly Norrie, MD by Roxan Diesel, ED scribe.  This patient was seen in room APA12/APA12 and the patient's care was started at 2:22 PM.   Chief Complaint  Patient presents with  . Chest Pain  . Hypertension    The history is provided by the patient. No language interpreter was used.    HPI Comments: Molly Campbell is a 53 y.o. female with h/o COPD and uncontrolled HTN who presents to the Emergency Department complaining of intermittent sharp left-sided chest pain that began 2 hours ago.  Pt states her pain comes and is jabbing goes "like pins."  She denies nausea, vomiting, diaphoresis, or SOB.  Pt admits to prior h/o similar CP and states she has been hospitalized for it, although she is not sure why.  Pt is also hypertensive at 180/102 on arrival.  She admits to not taking any BP medications for a year because "I don't have them."  Sister states pt has also been stressed recently due to family issues.  Pt also complains of intermittent left arm pain and numbness for the past week.  She is left-handed.  She also notes a cough that began 1-2 weeks ago and is occasionally productive of greenish sputum.  She reports associated "low-grade" fever.  She denies wheezing although she did use her inhaler this morning.  Pt is a current one to two pack-per-day smoker.  She drinks around two 40-ounces of alcohol/day.  She states she last used cocaine a month ago.  She lives at home alone.  She denies any fall or injury.  Pt has her first appointment with a new PCP (Dr. Legrand Rams) is in 2 days.  She was seeing Dr. Sherrie Sport in [place]   Past Medical History  Diagnosis Date  . Hypertension   . Tobacco abuse   . Alcohol abuse   . Cocaine abuse   . Alcoholic hepatitis 20/06/5425  . Auditory hallucinations   . Anxiety disorder   . Schizophrenia   . COPD (chronic obstructive pulmonary disease)   . Emphysema   . Chronic  bronchitis with acute exacerbation     Past Surgical History  Procedure Laterality Date  . Abdominal hysterectomy    . Breast biopsy      Family History  Problem Relation Age of Onset  . Heart failure Other     History  Substance Use Topics  . Smoking status: Current Every Day Smoker -- 2.00 packs/day    Types: Cigarettes  . Smokeless tobacco: Current User  . Alcohol Use: 1.2 oz/week    2 Cans of beer per week  lives at home Lives alone Last cocaine a month ago Drinks 80 ounces of beer daily  OB History   Grav Para Term Preterm Abortions TAB SAB Ect Mult Living   3 3 3       3        Review of Systems  Constitutional: Positive for fever ("low-grade"). Negative for diaphoresis.  Respiratory: Positive for cough. Negative for shortness of breath.   Cardiovascular: Positive for chest pain.  Gastrointestinal: Negative for nausea and vomiting.  All other systems reviewed and are negative.      Allergies  Review of patient's allergies indicates no known allergies.  Home Medications   Current Outpatient Rx  Name  Route  Sig  Dispense  Refill  . ranitidine (ZANTAC) 150 MG tablet  Oral   Take 150 mg by mouth 2 (two) times daily.         . benazepril-hydrochlorthiazide (LOTENSIN HCT) 20-12.5 MG per tablet   Oral   Take 1 tablet by mouth daily. FOR YOUR HIGH BLOOD PRESSURE.   30 tablet   2   . OLANZapine (ZYPREXA) 20 MG tablet   Oral   Take 1 tablet (20 mg total) by mouth daily.   30 tablet   1   . raloxifene (EVISTA) 60 MG tablet   Oral   Take 60 mg by mouth daily.            BP 180/102  Pulse 79  Temp(Src) 98.2 F (36.8 C) (Oral)  Resp 16  SpO2 100%  Vital signs normal except hypertension  Physical Exam  Nursing note and vitals reviewed. Constitutional: She is oriented to person, place, and time. She appears well-developed and well-nourished.  Non-toxic appearance. She does not appear ill. No distress.  HENT:  Head: Normocephalic and  atraumatic.  Right Ear: External ear normal.  Left Ear: External ear normal.  Nose: Nose normal. No mucosal edema or rhinorrhea.  Mouth/Throat: Oropharynx is clear and moist and mucous membranes are normal. No dental abscesses or uvula swelling.  No upper teeth.  Speech very difficult to understand.  Eyes: Conjunctivae and EOM are normal. Pupils are equal, round, and reactive to light.  Neck: Normal range of motion and full passive range of motion without pain. Neck supple.  Cardiovascular: Normal rate, regular rhythm and normal heart sounds.  Exam reveals no gallop and no friction rub.   No murmur heard. Pulmonary/Chest: Effort normal and breath sounds normal. No respiratory distress. She has no wheezes. She has no rhonchi. She has no rales. She exhibits tenderness (left lower anterior chest wall). She exhibits no crepitus.    Abdominal: Soft. Normal appearance and bowel sounds are normal. She exhibits no distension. There is no tenderness. There is no rebound and no guarding.  Musculoskeletal: Normal range of motion. She exhibits no edema and no tenderness.  Moves all extremities well.   Neurological: She is alert and oriented to person, place, and time. She has normal strength. No cranial nerve deficit.  Skin: Skin is warm, dry and intact. No rash noted. No erythema. No pallor.  Psychiatric: She has a normal mood and affect. Her speech is normal and behavior is normal. Her mood appears not anxious.    ED Course  Procedures (including critical care time)  Medications  0.9 %  sodium chloride infusion ( Intravenous Stopped 08/02/13 1655)  ketorolac (TORADOL) 30 MG/ML injection 30 mg (30 mg Intravenous Given 08/02/13 1520)     DIAGNOSTIC STUDIES: Oxygen Saturation is 100% on room air, normal by my interpretation.    COORDINATION OF CARE: 2:33 PM-Discussed treatment plan which includes IV fluids, ketorolac, CXR, and labs with pt at bedside and pt agreed to plan.   4:24 PM-On recheck  pt states her pain is almost gone.  Reviewed unconcerning lab results and CXR with patient.  She is agreeable to discharge home.     Results for orders placed during the hospital encounter of 08/02/13  CBC WITH DIFFERENTIAL      Result Value Ref Range   WBC 4.9  4.0 - 10.5 K/uL   RBC 4.06  3.87 - 5.11 MIL/uL   Hemoglobin 13.7  12.0 - 15.0 g/dL   HCT 39.5  36.0 - 46.0 %   MCV 97.3  78.0 - 100.0 fL  MCH 33.7  26.0 - 34.0 pg   MCHC 34.7  30.0 - 36.0 g/dL   RDW 13.3  11.5 - 15.5 %   Platelets 322  150 - 400 K/uL   Neutrophils Relative % 37 (*) 43 - 77 %   Neutro Abs 1.8  1.7 - 7.7 K/uL   Lymphocytes Relative 54 (*) 12 - 46 %   Lymphs Abs 2.6  0.7 - 4.0 K/uL   Monocytes Relative 7  3 - 12 %   Monocytes Absolute 0.3  0.1 - 1.0 K/uL   Eosinophils Relative 1  0 - 5 %   Eosinophils Absolute 0.1  0.0 - 0.7 K/uL   Basophils Relative 1  0 - 1 %   Basophils Absolute 0.1  0.0 - 0.1 K/uL  COMPREHENSIVE METABOLIC PANEL      Result Value Ref Range   Sodium 139  137 - 147 mEq/L   Potassium 3.5 (*) 3.7 - 5.3 mEq/L   Chloride 100  96 - 112 mEq/L   CO2 27  19 - 32 mEq/L   Glucose, Bld 139 (*) 70 - 99 mg/dL   BUN 14  6 - 23 mg/dL   Creatinine, Ser 0.70  0.50 - 1.10 mg/dL   Calcium 9.6  8.4 - 10.5 mg/dL   Total Protein 7.9  6.0 - 8.3 g/dL   Albumin 3.9  3.5 - 5.2 g/dL   AST 29  0 - 37 U/L   ALT 23  0 - 35 U/L   Alkaline Phosphatase 85  39 - 117 U/L   Total Bilirubin 0.4  0.3 - 1.2 mg/dL   GFR calc non Af Amer >90  >90 mL/min   GFR calc Af Amer >90  >90 mL/min  ETHANOL      Result Value Ref Range   Alcohol, Ethyl (B) <11  0 - 11 mg/dL  URINALYSIS, ROUTINE W REFLEX MICROSCOPIC      Result Value Ref Range   Color, Urine YELLOW  YELLOW   APPearance CLEAR  CLEAR   Specific Gravity, Urine 1.025  1.005 - 1.030   pH 6.0  5.0 - 8.0   Glucose, UA NEGATIVE  NEGATIVE mg/dL   Hgb urine dipstick NEGATIVE  NEGATIVE   Bilirubin Urine NEGATIVE  NEGATIVE   Ketones, ur NEGATIVE  NEGATIVE mg/dL    Protein, ur NEGATIVE  NEGATIVE mg/dL   Urobilinogen, UA 0.2  0.0 - 1.0 mg/dL   Nitrite NEGATIVE  NEGATIVE   Leukocytes, UA NEGATIVE  NEGATIVE  URINE RAPID DRUG SCREEN (HOSP PERFORMED)      Result Value Ref Range   Opiates NONE DETECTED  NONE DETECTED   Cocaine NONE DETECTED  NONE DETECTED   Benzodiazepines NONE DETECTED  NONE DETECTED   Amphetamines NONE DETECTED  NONE DETECTED   Tetrahydrocannabinol NONE DETECTED  NONE DETECTED   Barbiturates NONE DETECTED  NONE DETECTED   Laboratory interpretation all normal except mild hypokalemia   Dg Chest Portable 1 View  08/02/2013   CLINICAL DATA:  Chest pain and shortness of breath.  EXAM: PORTABLE CHEST - 1 VIEW  COMPARISON:  DG CHEST 2V dated 05/27/2013  FINDINGS: Heart and mediastinum are within normal limits. Lungs are clear without focal disease. Trachea is midline. No acute bone abnormality.  IMPRESSION: No acute cardiopulmonary disease.   Electronically Signed   By: Markus Daft M.D.   On: 08/02/2013 15:04      EKG Interpretation   Date/Time:  Saturday August 02 2013 13:59:57 EDT  Ventricular Rate:  68 PR Interval:  136 QRS Duration: 82 QT Interval:  412 QTC Calculation: 438 R Axis:   60 Text Interpretation:  Normal sinus rhythm Right atrial enlargement  Nonspecific ST and T wave abnormality When compared with ECG of  05-Jan-2013 17:59, Sinus rhythm has replaced Atrial flutter Vent. rate has  decreased BY  74 BPM ST less depressed in Inferior leads Confirmed by  Kashena Novitski  MD-I, Darold Miley ([id number]) on 08/02/2013 2:16:11 PM      MDM   patient presents with noncardiac chest pain in her left chest wall. She also has not been taking any blood pressure medication for a year. She also has history of alcohol abuse and also uses cocaine at times. Patient will be treated for bronchitis and started on a blood pressure medication. She has an appointment to see her primary care doctor for her first appointment in 2 days.    Final diagnoses:  Chest pain   Bronchitis    Discharge Medication List as of 08/02/2013  4:40 PM    START taking these medications   Details  amLODipine (NORVASC) 10 MG tablet Take 1 tablet (10 mg total) by mouth daily., Starting 08/02/2013, Until Discontinued, Print    amoxicillin (AMOXIL) 500 MG capsule Take 1 capsule (500 mg total) by mouth 3 (three) times daily., Starting 08/02/2013, Until Discontinued, Print    naproxen (NAPROSYN) 375 MG tablet Take 1 tablet (375 mg total) by mouth 2 (two) times daily., Starting 08/02/2013, Until Discontinued, Print        Plan discharge   Rolland Porter, MD, FACEP    I personally performed the services described in this documentation, which was scribed in my presence. The recorded information has been reviewed and considered.     Molly Norrie, MD 08/02/13 [phone]

## 2013-08-02 NOTE — ED Notes (Addendum)
Pt c/o left side chest pain, elevated blood pressure that started 4 days ago, reports that the pain is sharp and intermittent, is associated with sob at times, pt reports that she has not taken her blood pressure in over a year,

## 2013-08-02 NOTE — Discharge Instructions (Signed)
Continue using your inhaler for wheezing and shortness of breath. Take the naprosyn for your chest wall pain. Take the antibiotic for your cough and mucus you are coughing up, you should STOP SMOKING!! Take the amlodipine daily for your blood pressure. Keep your appointment with Dr Legrand Rams on Monday so you can get back on your medications.

## 2013-09-03 ENCOUNTER — Other Ambulatory Visit: Payer: Self-pay | Admitting: Women's Health

## 2013-09-26 ENCOUNTER — Ambulatory Visit (INDEPENDENT_AMBULATORY_CARE_PROVIDER_SITE_OTHER): Payer: Medicaid Other | Admitting: Adult Health

## 2013-09-26 ENCOUNTER — Other Ambulatory Visit (HOSPITAL_COMMUNITY)
Admission: RE | Admit: 2013-09-26 | Discharge: 2013-09-26 | Disposition: A | Discharge status: Home / Self Care | Payer: Medicaid Other | Source: Ambulatory Visit | Attending: Adult Health | Admitting: Adult Health

## 2013-09-26 ENCOUNTER — Encounter: Payer: Self-pay | Admitting: Adult Health

## 2013-09-26 VITALS — BP 156/102 | HR 78 | Ht 61.5 in | Wt 114.5 lb

## 2013-09-26 DIAGNOSIS — Z0000 Encounter for general adult medical examination without abnormal findings: Secondary | ICD-10-CM

## 2013-09-26 DIAGNOSIS — Z113 Encounter for screening for infections with a predominantly sexual mode of transmission: Secondary | ICD-10-CM | POA: Insufficient documentation

## 2013-09-26 DIAGNOSIS — Z1151 Encounter for screening for human papillomavirus (HPV): Secondary | ICD-10-CM | POA: Insufficient documentation

## 2013-09-26 DIAGNOSIS — Z1212 Encounter for screening for malignant neoplasm of rectum: Secondary | ICD-10-CM

## 2013-09-26 DIAGNOSIS — Z124 Encounter for screening for malignant neoplasm of cervix: Secondary | ICD-10-CM | POA: Insufficient documentation

## 2013-09-26 DIAGNOSIS — Z01419 Encounter for gynecological examination (general) (routine) without abnormal findings: Secondary | ICD-10-CM

## 2013-09-26 DIAGNOSIS — Z139 Encounter for screening, unspecified: Secondary | ICD-10-CM

## 2013-09-26 DIAGNOSIS — Z8541 Personal history of malignant neoplasm of cervix uteri: Secondary | ICD-10-CM

## 2013-09-26 HISTORY — DX: Personal history of malignant neoplasm of cervix uteri: Z85.41

## 2013-09-26 LAB — HEMOCCULT GUIAC POC 1CARD (OFFICE): Fecal Occult Blood, POC: NEGATIVE

## 2013-09-26 MED ORDER — IBUPROFEN 800 MG PO TABS: 800.0000 mg | ORAL_TABLET | Freq: Three times a day (TID) | ORAL | Status: DC | PRN

## 2013-09-26 NOTE — Patient Instructions (Signed)
Physical in 1 year Mammogram yearly Colonoscopy advised refer Dr Oneida Alar Use lubricate with sex  Labs with PCP Try ice and motrin for back

## 2013-09-26 NOTE — Progress Notes (Addendum)
Patient ID: Molly Campbell, female   DOB: 1961/02/10, 53 y.o.   MRN: [id number] History of Present Illness: [name] is a 53 year old black female single, in for a pap and physical.She had spotting after sex, does not have often, and has pain in back at times.   Current Medications, Allergies, Past Medical History, Past Surgical History, Family History and Social History were reviewed in [employer] record.   Past Medical History  Diagnosis Date  . Hypertension   . Tobacco abuse   . Alcohol abuse   . Cocaine abuse   . Alcoholic hepatitis 10/08/6946  . Auditory hallucinations   . Anxiety disorder   . Schizophrenia   . COPD (chronic obstructive pulmonary disease)   . Emphysema   . Chronic bronchitis with acute exacerbation   . Cancer     cervical  . History of cervical cancer 09/26/2013   Past Surgical History  Procedure Laterality Date  . Abdominal hysterectomy    . Breast biopsy    Current outpatient prescriptions:Albuterol Sulfate (PROAIR HFA IN), Inhale into the lungs as needed., Disp: , Rfl: ;  amLODipine (NORVASC) 5 MG tablet, Take 5 mg by mouth daily., Disp: , Rfl: ;  OLANZapine (ZYPREXA) 20 MG tablet, Take 1 tablet (20 mg total) by mouth daily., Disp: 30 tablet, Rfl: 1;  ranitidine (ZANTAC) 150 MG tablet, Take 150 mg by mouth 2 (two) times daily., Disp: , Rfl:  ibuprofen (ADVIL,MOTRIN) 800 MG tablet, Take 1 tablet (800 mg total) by mouth every 8 (eight) hours as needed., Disp: 60 tablet, Rfl: 1;  raloxifene (EVISTA) 60 MG tablet, Take 60 mg by mouth daily.  , Disp: , Rfl:   Review of Systems: Patient denies any headaches, blurred vision, shortness of breath, chest pain, abdominal pain, problems with bowel movements,or urination. No mood changes, see HPI for positives.She is sp hysterectomy for cervical cancer.    Physical Exam:BP 156/102  Pulse 78  Ht 5' 1.5" (1.562 m)  Wt 114 lb 8 oz (51.937 kg)  BMI 21.29 kg/m2 General:  Well developed, well  nourished, no acute distress Skin:  Warm and dry Neck:  Midline trachea, normal thyroid Lungs; Clear to auscultation bilaterally Breast:  No dominant palpable mass, retraction, or nipple discharge Cardiovascular: Regular rate and rhythm Abdomen:  Soft, non tender, no hepatosplenomegaly Pelvic:  External genitalia is normal in appearance.  The vagina has a loss of color, moisture and rugae.The cervix and uterus are absent, Pap performed with HPV and GC/CHL.  No  adnexal masses or tenderness noted. Rectal: Good sphincter tone, no polyps, or hemorrhoids felt.  Hemoccult negative.She says she has pain in rectum sometimes. Extremities:  No swelling or varicosities noted Psych:  No mood changes, alert and cooperative, seems happy She said she took her BP meds and has not done any drugs in over a week. Discussed using good lubricate with sex, since she not having often.  Impression: Yearly gyn exam History of cervical cancer    Plan: Physical in 1 year Mammogram year Refer to Dr Oneida Alar for colonoscopy Labs with Dr Legrand Rams Try ice and motrin for back Rx motrin 800 mg #60 1 every 8 hours prn pain with 1 refill Follow up with Dr Legrand Rams for BP and back ache

## 2013-10-06 ENCOUNTER — Telehealth: Payer: Self-pay | Admitting: Adult Health

## 2013-10-06 NOTE — Telephone Encounter (Signed)
Left message to call about pap 

## 2013-10-08 ENCOUNTER — Telehealth: Payer: Self-pay | Admitting: Adult Health

## 2013-10-08 NOTE — Telephone Encounter (Signed)
Left message to call.

## 2013-10-15 ENCOUNTER — Telehealth: Payer: Self-pay | Admitting: Adult Health

## 2013-10-15 NOTE — Telephone Encounter (Signed)
Left message to call.

## 2013-10-16 ENCOUNTER — Telehealth: Payer: Self-pay | Admitting: Adult Health

## 2013-10-16 NOTE — Telephone Encounter (Signed)
Left message with female to have her call me

## 2013-10-17 ENCOUNTER — Telehealth: Payer: Self-pay | Admitting: Adult Health

## 2013-10-17 MED ORDER — METRONIDAZOLE 500 MG PO TABS: ORAL_TABLET | ORAL | Status: DC

## 2013-10-17 NOTE — Telephone Encounter (Signed)
Pt aware of pap and +trich will treat with flagyl 500 mg 4 po now

## 2013-11-24 DIAGNOSIS — F411 Generalized anxiety disorder: Secondary | ICD-10-CM | POA: Insufficient documentation

## 2013-11-24 DIAGNOSIS — Y939 Activity, unspecified: Secondary | ICD-10-CM | POA: Insufficient documentation

## 2013-11-24 DIAGNOSIS — Z8541 Personal history of malignant neoplasm of cervix uteri: Secondary | ICD-10-CM | POA: Diagnosis not present

## 2013-11-24 DIAGNOSIS — Z8719 Personal history of other diseases of the digestive system: Secondary | ICD-10-CM | POA: Diagnosis not present

## 2013-11-24 DIAGNOSIS — W010XXA Fall on same level from slipping, tripping and stumbling without subsequent striking against object, initial encounter: Secondary | ICD-10-CM | POA: Insufficient documentation

## 2013-11-24 DIAGNOSIS — J449 Chronic obstructive pulmonary disease, unspecified: Secondary | ICD-10-CM | POA: Diagnosis not present

## 2013-11-24 DIAGNOSIS — J4489 Other specified chronic obstructive pulmonary disease: Secondary | ICD-10-CM | POA: Insufficient documentation

## 2013-11-24 DIAGNOSIS — F209 Schizophrenia, unspecified: Secondary | ICD-10-CM | POA: Diagnosis not present

## 2013-11-24 DIAGNOSIS — F17200 Nicotine dependence, unspecified, uncomplicated: Secondary | ICD-10-CM | POA: Diagnosis not present

## 2013-11-24 DIAGNOSIS — S52609A Unspecified fracture of lower end of unspecified ulna, initial encounter for closed fracture: Secondary | ICD-10-CM | POA: Diagnosis not present

## 2013-11-24 DIAGNOSIS — Y92009 Unspecified place in unspecified non-institutional (private) residence as the place of occurrence of the external cause: Secondary | ICD-10-CM | POA: Insufficient documentation

## 2013-11-24 DIAGNOSIS — Z79899 Other long term (current) drug therapy: Secondary | ICD-10-CM | POA: Insufficient documentation

## 2013-11-24 DIAGNOSIS — S46909A Unspecified injury of unspecified muscle, fascia and tendon at shoulder and upper arm level, unspecified arm, initial encounter: Secondary | ICD-10-CM | POA: Diagnosis present

## 2013-11-24 DIAGNOSIS — S4980XA Other specified injuries of shoulder and upper arm, unspecified arm, initial encounter: Secondary | ICD-10-CM | POA: Diagnosis present

## 2013-11-24 DIAGNOSIS — I10 Essential (primary) hypertension: Secondary | ICD-10-CM | POA: Insufficient documentation

## 2013-11-25 ENCOUNTER — Emergency Department (HOSPITAL_COMMUNITY): Payer: Medicaid Other

## 2013-11-25 ENCOUNTER — Emergency Department (HOSPITAL_COMMUNITY)
Admission: EM | Admit: 2013-11-25 | Discharge: 2013-11-25 | Disposition: A | Discharge status: Home / Self Care | Payer: Medicaid Other | Attending: Emergency Medicine | Admitting: Emergency Medicine

## 2013-11-25 ENCOUNTER — Encounter (HOSPITAL_COMMUNITY): Payer: Self-pay | Admitting: Emergency Medicine

## 2013-11-25 DIAGNOSIS — S52501A Unspecified fracture of the lower end of right radius, initial encounter for closed fracture: Secondary | ICD-10-CM

## 2013-11-25 DIAGNOSIS — Y92009 Unspecified place in unspecified non-institutional (private) residence as the place of occurrence of the external cause: Secondary | ICD-10-CM

## 2013-11-25 DIAGNOSIS — W19XXXA Unspecified fall, initial encounter: Secondary | ICD-10-CM

## 2013-11-25 MED ORDER — OXYCODONE-ACETAMINOPHEN 5-325 MG PO TABS: 1.0000 | ORAL_TABLET | ORAL | Status: DC | PRN

## 2013-11-25 MED ORDER — OXYCODONE-ACETAMINOPHEN 5-325 MG PO TABS
1.0000 | ORAL_TABLET | Freq: Once | ORAL | Status: AC
  Administered 2013-11-25: 1 via ORAL
  Filled 2013-11-25: qty 1

## 2013-11-25 NOTE — Discharge Instructions (Signed)
Cast or Splint Care °Casts and splints support injured limbs and keep bones from moving while they heal. It is important to care for your cast or splint at home.   °HOME CARE INSTRUCTIONS °· Keep the cast or splint uncovered during the drying period. It can take 24 to 48 hours to dry if it is made of plaster. A fiberglass cast will dry in less than 1 hour. °· Do not rest the cast on anything harder than a pillow for the first 24 hours. °· Do not put weight on your injured limb or apply pressure to the cast until your health care provider gives you permission. °· Keep the cast or splint dry. Wet casts or splints can lose their shape and may not support the limb as well. A wet cast that has lost its shape can also create harmful pressure on your skin when it dries. Also, wet skin can become infected. °¨ Cover the cast or splint with a plastic bag when bathing or when out in the rain or snow. If the cast is on the trunk of the body, take sponge baths until the cast is removed. °¨ If your cast does become wet, dry it with a towel or a blow dryer on the cool setting only. °· Keep your cast or splint clean. Soiled casts may be wiped with a moistened cloth. °· Do not place any hard or soft foreign objects under your cast or splint, such as cotton, toilet paper, lotion, or powder. °· Do not try to scratch the skin under the cast with any object. The object could get stuck inside the cast. Also, scratching could lead to an infection. If itching is a problem, use a blow dryer on a cool setting to relieve discomfort. °· Do not trim or cut your cast or remove padding from inside of it. °· Exercise all joints next to the injury that are not immobilized by the cast or splint. For example, if you have a long leg cast, exercise the hip joint and toes. If you have an arm cast or splint, exercise the shoulder, elbow, thumb, and fingers. °· Elevate your injured arm or leg on 1 or 2 pillows for the first 1 to 3 days to decrease  swelling and pain. It is best if you can comfortably elevate your cast so it is higher than your heart. °SEEK MEDICAL CARE IF:  °· Your cast or splint cracks. °· Your cast or splint is too tight or too loose. °· You have unbearable itching inside the cast. °· Your cast becomes wet or develops a soft spot or area. °· You have a bad smell coming from inside your cast. °· You get an object stuck under your cast. °· Your skin around the cast becomes red or raw. °· You have new pain or worsening pain after the cast has been applied. °SEEK IMMEDIATE MEDICAL CARE IF:  °· You have fluid leaking through the cast. °· You are unable to move your fingers or toes. °· You have discolored (blue or white), cool, painful, or very swollen fingers or toes beyond the cast. °· You have tingling or numbness around the injured area. °· You have severe pain or pressure under the cast. °· You have any difficulty with your breathing or have shortness of breath. °· You have chest pain. °Document Released: 04/21/2000 Document Revised: 02/12/2013 Document Reviewed: 10/31/2012 °Exit[employer]® Patient Information ©2015 [employer]. This information is not intended to replace advice given to you by your health care   provider. Make sure you discuss any questions you have with your health care provider.  Wrist Fracture A wrist fracture is a break in one of the bones of the wrist. Your wrist is made up of several small bones at the palm of your hand (carpal bones) and the two bones that make up your forearm (radius and ulna). The bones come together to form multiple large and small joints. The shape and design of these joints allow your wrist to bend and straighten, move side-to-side, and rotate, as in twisting your palm up or down. CAUSES  A fracture may occur in any of the bones in your wrist when enough force is applied to the wrist, such as when falling down onto an outstretched hand. Severe injuries may occur from a more forceful  injury. SYMPTOMS Symptoms of wrist fractures include tenderness, bruising, and swelling. Additionally, the wrist may hang in an odd position or may be misshaped. DIAGNOSIS To diagnose a wrist fracture, your caregiver will physically examine your wrist. Your caregiver may also request an X-ray exam of your wrist. TREATMENT Treatment depends on many factors, including the nature and location of the fracture, your age, and your activity level. Treatment for wrist fracture can be nonsurgical or surgical. For nonsurgical treatment, a plaster cast or splint may be applied to your wrist if the bone is in a good position (aligned). The cast will stay on for about 6 weeks. If the alignment of your bone is not good, it may be necessary to realign (reduce) it. After the bone is reduced, a splint usually is placed on your wrist to allow for a small amount of normal swelling. After about 1 week, the splint is removed and a cast is added. The cast is removed 2 or 3 weeks later, after the swelling goes down, causing the cast to loosen. Another cast is applied. This cast is removed after about another 2 or 3 weeks, for a total of 4 to 6 weeks of immobilization. Sometimes the position of the bone is so far out of place that surgery is required to apply a device to hold it together as it heals. If the bone cannot be reduced without cutting the skin around the bone (closed reduction), a cut (incision) is made to allow direct access to the bone to reduce it (open reduction). Depending on the fracture, there are a number of options for holding the bone in place while it heals, including a cast, metal pins, a plate and screws, and a device called an external fixator. With an external fixator, most of the hardware remains outside of the body. HOME CARE INSTRUCTIONS  To lessen swelling, keep your injured wrist elevated and move your fingers as much as possible.  Apply ice to your wrist for the first 1 to 2 days after you have  been treated or as directed by your caregiver. Applying ice helps to reduce inflammation and pain.  Put ice in a plastic bag.  Place a towel between your skin and the bag.  Leave the ice on for 15 to 20 minutes at a time, every 2 hours while you are awake.  Do not put pressure on any part of your cast or splint. It may break.  Use a plastic bag to protect your cast or splint from water while bathing or showering. Do not lower your cast or splint into water.  Only take over-the-counter or prescription medicines for pain as directed by your caregiver. SEEK IMMEDIATE MEDICAL CARE IF:  Your cast or splint gets damaged or breaks.  You have continued severe pain or more swelling than you did before the cast was put on.  Your skin or fingernails below the injury turn blue or gray or feel cold or numb.  You develop decreased feeling in your fingers. MAKE SURE YOU:  Understand these instructions.  Will watch your condition.  Will get help right away if you are not doing well or get worse. Document Released: 02/01/2005 Document Revised: 07/17/2011 Document Reviewed: 05/12/2011 [facility] Patient Information 2015 [place], [place]. This information is not intended to replace advice given to you by your health care provider. Make sure you discuss any questions you have with your health care provider.  Acetaminophen; Oxycodone tablets What is this medicine? ACETAMINOPHEN; OXYCODONE (a set a MEE noe fen; ox i KOE done) is a pain reliever. It is used to treat mild to moderate pain. This medicine may be used for other purposes; ask your health care provider or pharmacist if you have questions. COMMON BRAND NAME(S): Endocet, Magnacet, Narvox, Percocet, Perloxx, Primalev, Primlev, Roxicet, Xolox What should I tell my health care provider before I take this medicine? They need to know if you have any of these conditions: -brain tumor -Crohn's disease, inflammatory bowel disease, or ulcerative  colitis -drug abuse or addiction -head injury -heart or circulation problems -if you often drink alcohol -kidney disease or problems going to the bathroom -liver disease -lung disease, asthma, or breathing problems -an unusual or allergic reaction to acetaminophen, oxycodone, other opioid analgesics, other medicines, foods, dyes, or preservatives -pregnant or trying to get pregnant -breast-feeding How should I use this medicine? Take this medicine by mouth with a full glass of water. Follow the directions on the prescription label. Take your medicine at regular intervals. Do not take your medicine more often than directed. Talk to your pediatrician regarding the use of this medicine in children. Special care may be needed. Patients over 35 years old may have a stronger reaction and need a smaller dose. Overdosage: If you think you have taken too much of this medicine contact a poison control center or emergency room at once. NOTE: This medicine is only for you. Do not share this medicine with others. What if I miss a dose? If you miss a dose, take it as soon as you can. If it is almost time for your next dose, take only that dose. Do not take double or extra doses. What may interact with this medicine? -alcohol -antihistamines -barbiturates like amobarbital, butalbital, butabarbital, methohexital, pentobarbital, phenobarbital, thiopental, and secobarbital -benztropine -drugs for bladder problems like solifenacin, trospium, oxybutynin, tolterodine, hyoscyamine, and methscopolamine -drugs for breathing problems like ipratropium and tiotropium -drugs for certain stomach or intestine problems like propantheline, homatropine methylbromide, glycopyrrolate, atropine, belladonna, and dicyclomine -general anesthetics like etomidate, ketamine, nitrous oxide, propofol, desflurane, enflurane, halothane, isoflurane, and sevoflurane -medicines for depression, anxiety, or psychotic  disturbances -medicines for sleep -muscle relaxants -naltrexone -narcotic medicines (opiates) for pain -phenothiazines like perphenazine, thioridazine, chlorpromazine, mesoridazine, fluphenazine, prochlorperazine, promazine, and trifluoperazine -scopolamine -tramadol -trihexyphenidyl This list may not describe all possible interactions. Give your health care provider a list of all the medicines, herbs, non-prescription drugs, or dietary supplements you use. Also tell them if you smoke, drink alcohol, or use illegal drugs. Some items may interact with your medicine. What should I watch for while using this medicine? Tell your doctor or health care professional if your pain does not go away, if it gets worse, or if you  have new or a different type of pain. You may develop tolerance to the medicine. Tolerance means that you will need a higher dose of the medication for pain relief. Tolerance is normal and is expected if you take this medicine for a long time. Do not suddenly stop taking your medicine because you may develop a severe reaction. Your body becomes used to the medicine. This does NOT mean you are addicted. Addiction is a behavior related to getting and using a drug for a non-medical reason. If you have pain, you have a medical reason to take pain medicine. Your doctor will tell you how much medicine to take. If your doctor wants you to stop the medicine, the dose will be slowly lowered over time to avoid any side effects. You may get drowsy or dizzy. Do not drive, use machinery, or do anything that needs mental alertness until you know how this medicine affects you. Do not stand or sit up quickly, especially if you are an older patient. This reduces the risk of dizzy or fainting spells. Alcohol may interfere with the effect of this medicine. Avoid alcoholic drinks. There are different types of narcotic medicines (opiates) for pain. If you take more than one type at the same time, you may have  more side effects. Give your health care provider a list of all medicines you use. Your doctor will tell you how much medicine to take. Do not take more medicine than directed. Call emergency for help if you have problems breathing. The medicine will cause constipation. Try to have a bowel movement at least every 2 to 3 days. If you do not have a bowel movement for 3 days, call your doctor or health care professional. Do not take Tylenol (acetaminophen) or medicines that have acetaminophen with this medicine. Too much acetaminophen can be very dangerous. Many nonprescription medicines contain acetaminophen. Always read the labels carefully to avoid taking more acetaminophen. What side effects may I notice from receiving this medicine? Side effects that you should report to your doctor or health care professional as soon as possible: -allergic reactions like skin rash, itching or hives, swelling of the face, lips, or tongue -breathing difficulties, wheezing -confusion -light headedness or fainting spells -severe stomach pain -unusually weak or tired -yellowing of the skin or the whites of the eyes Side effects that usually do not require medical attention (report to your doctor or health care professional if they continue or are bothersome): -dizziness -drowsiness -nausea -vomiting This list may not describe all possible side effects. Call your doctor for medical advice about side effects. You may report side effects to FDA at 1-800-FDA-1088. Where should I keep my medicine? Keep out of the reach of children. This medicine can be abused. Keep your medicine in a safe place to protect it from theft. Do not share this medicine with anyone. Selling or giving away this medicine is dangerous and against the law. Store at room temperature between 20 and 25 degrees C (68 and 77 degrees F). Keep container tightly closed. Protect from light. This medicine may cause accidental overdose and death if it is  taken by other adults, children, or pets. Flush any unused medicine down the toilet to reduce the chance of harm. Do not use the medicine after the expiration date. NOTE: This sheet is a summary. It may not cover all possible information. If you have questions about this medicine, talk to your doctor, pharmacist, or health care provider.  2015, [employer]vier/[employer] Standard. (2012-12-16 13:17:35)

## 2013-11-25 NOTE — ED Provider Notes (Signed)
CSN: [id number]     Arrival date & time 11/24/13  2328 History   First MD Initiated Contact with Patient 11/25/13 0046     Chief Complaint  Patient presents with  . Arm Injury     (Consider location/radiation/quality/duration/timing/severity/associated sxs/prior Treatment) Patient is a 53 y.o. female presenting with arm injury. The history is provided by the patient.  Arm Injury She says that she slipped and fell and injured her right arm. Pain is severe and she rates the pain at 10/10. She denies other injury. She specifically denies head, neck, back, chest, abdomen injury. She admits to drinking alcohol tonight but is evasive regarding how much she drank.  Past Medical History  Diagnosis Date  . Hypertension   . Tobacco abuse   . Alcohol abuse   . Cocaine abuse   . 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 Auditory hallucinations   . Anxiety disorder   . Schizophrenia   . COPD (chronic obstructive pulmonary disease)   . Emphysema   . Chronic bronchitis with acute exacerbation   . Cancer     cervical  . History of cervical cancer 09/26/2013   Past Surgical History  Procedure Laterality Date  . Abdominal hysterectomy    . Breast biopsy     Family History  Problem Relation Age of Onset  . Heart failure Other   . Alzheimer's disease Mother   . Other Father     "he was beat to death"  . Heart disease Sister   . Gout Sister   . COPD Sister   . Seizures Sister   . Diabetes Sister   . Diabetes Brother   . Cancer Paternal Grandmother   . Seizures Sister   . Other Sister     "bones are messed up"  . Hypertension Brother   . Heart attack Brother   . Other Brother     MVA  . Diabetes Brother   . Other Brother     MVA  . Alcohol abuse Daughter   . Other Son     motorcycle accident   History  Substance Use Topics  . Smoking status: Current Every Day Smoker -- 2.00 packs/day    Types: Cigarettes  . Smokeless tobacco: Former Systems developer    Types: Snuff  . Alcohol Use: 1.2  oz/week    2 Cans of beer per week     Comment: every day   OB History   Grav Para Term Preterm Abortions TAB SAB Ect Mult Living   3 3 3       3      Review of Systems  All other systems reviewed and are negative.     Allergies  Review of patient's allergies indicates no known allergies.  Home Medications   Prior to Admission medications   Medication Sig Start Date End Date Taking? Authorizing Provider  Albuterol Sulfate (PROAIR HFA IN) Inhale into the lungs as needed.   Yes Historical Provider, MD  amLODipine (NORVASC) 5 MG tablet Take 5 mg by mouth daily.   Yes Historical Provider, MD  OLANZapine (ZYPREXA) 20 MG tablet Take 1 tablet (20 mg total) by mouth daily. 03/16/11  Yes Rexene Alberts, MD  raloxifene (EVISTA) 60 MG tablet Take 60 mg by mouth daily.     Yes Historical Provider, MD  ranitidine (ZANTAC) 150 MG tablet Take 150 mg by mouth 2 (two) times daily.   Yes Historical Provider, MD  ibuprofen (ADVIL,MOTRIN) 800 MG tablet Take 1 tablet (800 mg total) by  mouth every 8 (eight) hours as needed. 09/26/13   Estill Dooms, NP  metroNIDAZOLE (FLAGYL) 500 MG tablet Take 4 po now, no alcohol 10/17/13   Estill Dooms, NP   BP 119/91  Pulse 96  Temp(Src) 98.1 F (36.7 C) (Oral)  Resp 18  Ht 5\' 2"  (1.575 m)  Wt 115 lb (52.164 kg)  BMI 21.03 kg/m2  SpO2 98% Physical Exam  Nursing note and vitals reviewed.  53 year old female, resting comfortably and in no acute distress. Vital signs are normal. Oxygen saturation is 98%, which is normal. Head is normocephalic and atraumatic. PERRLA, EOMI. Oropharynx is clear. Neck is nontender without adenopathy or JVD. Back is nontender and there is no CVA tenderness. Lungs are clear without rales, wheezes, or rhonchi. Chest is nontender. Heart has regular rate and rhythm without murmur. Abdomen is soft, flat, nontender without masses or hepatosplenomegaly and peristalsis is normoactive. Extremities: Swelling present in the distal  right forearm and wrist with marked tenderness in this area. No clear deformity. Distal neurovascular exam is intact with prompt capillary refill and normal sensation. There is marked pain with any movement of the right wrist. Full range of motion of all other joints without pain. Skin is warm and dry without rash. Neurologic: She is awake and alert and conversant but speech is slightly slurred consistent with recent ethanol consumption.  ED Course  Procedures (including critical care time)  Imaging Review Dg Forearm Right  11/25/2013   CLINICAL DATA:  Distal forearm and wrist pain after falling today.  EXAM: RIGHT FOREARM - 2 VIEW  COMPARISON:  Wrist radiographs same date.  FINDINGS: Impacted fracture of the distal radius with associated dorsal angulation is again noted. There is a nondisplaced ulnar styloid fracture. No proximal fracture or dislocation is identified. The alignment is normal at the elbow. There is no elbow joint effusion.  IMPRESSION: No proximal forearm injury is identified. Acute fractures of the distal radius and ulna as described on examination of the wrist.   Electronically Signed   By: Camie Patience M.D.   On: 11/25/2013 01:06   Dg Wrist Complete Right  11/25/2013   CLINICAL DATA:  ARM INJURY  EXAM: RIGHT WRIST - COMPLETE 3+ VIEW  COMPARISON:  None.  FINDINGS: There is an acute comminuted intra-articular fracture of the distal radius with impaction and slight dorsal angulation. Additional acute nondisplaced fracture of the ulnar styloid present with associated comminution. Soft tissue swelling present about the wrist.  IMPRESSION: 1. Acute comminuted intra-articular fracture of the distal radius with dorsal angulation and impaction. 2. Acute nondisplaced comminuted ulnar styloid fracture.   Electronically Signed   By: Jeannine Boga M.D.   On: 11/25/2013 01:00    Images viewed by me.  SPLINT APPLICATION Date/Time: 16:10 AM Authorized by: RUEAV,[email or web address] Consent: Verbal  consent obtained. Risks and benefits: risks, benefits and alternatives were discussed Consent given by: patient Splint applied by: RN Location details: right forearm Splint type: sugar-tong Supplies used: cotton padding, fiberglass splint, elastic bandage Post-procedure: The splinted body part was neurovascularly unchanged following the procedure. Patient tolerance: Patient tolerated the procedure well with no immediate complications.    MDM   Final diagnoses:  None    Right wrist injury. X-rays show comminuted distal radius fracture with intra-articular component. She is placed in a sugar tong splint and referred to orthopedics. She is given a prescription for oxycodone acetaminophen for pain.    Delora Fuel, MD 19/14/78 [id number]

## 2013-11-25 NOTE — ED Notes (Signed)
Pt waiting in room in w/c for pt aide, Kennyth Lose to arrive to bring the pt home. Pt has been drinking tonight and has received pain medication

## 2013-11-25 NOTE — ED Notes (Signed)
Pt states she fell tonight and has pain to right arm.  Pt admits to drinking tonight

## 2013-12-01 ENCOUNTER — Telehealth: Payer: Self-pay | Admitting: Orthopedic Surgery

## 2013-12-01 NOTE — Telephone Encounter (Signed)
Patient called for first time today to request an appointment for [facility] follow up from 11/25/13.  Told her Dr. Luna Glasgow was on call that day and may possible get a sooner appointment for her, but she has [place] Medicaid and will need a referral from her PCP for either doctor

## 2013-12-02 ENCOUNTER — Encounter (INDEPENDENT_AMBULATORY_CARE_PROVIDER_SITE_OTHER): Payer: Self-pay

## 2013-12-02 ENCOUNTER — Other Ambulatory Visit: Payer: Self-pay | Admitting: Gastroenterology

## 2013-12-02 ENCOUNTER — Emergency Department (HOSPITAL_COMMUNITY)
Admission: EM | Admit: 2013-12-02 | Discharge: 2013-12-03 | Disposition: A | Discharge status: Home / Self Care | Payer: Medicaid Other | Attending: Emergency Medicine | Admitting: Emergency Medicine

## 2013-12-02 ENCOUNTER — Ambulatory Visit (INDEPENDENT_AMBULATORY_CARE_PROVIDER_SITE_OTHER): Payer: Medicaid Other | Admitting: Gastroenterology

## 2013-12-02 ENCOUNTER — Encounter: Payer: Self-pay | Admitting: Gastroenterology

## 2013-12-02 ENCOUNTER — Encounter (HOSPITAL_COMMUNITY): Payer: Self-pay | Admitting: Emergency Medicine

## 2013-12-02 VITALS — BP 148/99 | HR 93 | Temp 97.4°F | Ht 62.0 in | Wt 110.8 lb

## 2013-12-02 DIAGNOSIS — J449 Chronic obstructive pulmonary disease, unspecified: Secondary | ICD-10-CM | POA: Diagnosis not present

## 2013-12-02 DIAGNOSIS — Z8541 Personal history of malignant neoplasm of cervix uteri: Secondary | ICD-10-CM | POA: Insufficient documentation

## 2013-12-02 DIAGNOSIS — R1013 Epigastric pain: Secondary | ICD-10-CM

## 2013-12-02 DIAGNOSIS — Z8781 Personal history of (healed) traumatic fracture: Secondary | ICD-10-CM | POA: Insufficient documentation

## 2013-12-02 DIAGNOSIS — F209 Schizophrenia, unspecified: Secondary | ICD-10-CM | POA: Diagnosis not present

## 2013-12-02 DIAGNOSIS — M79609 Pain in unspecified limb: Secondary | ICD-10-CM | POA: Diagnosis not present

## 2013-12-02 DIAGNOSIS — F1010 Alcohol abuse, uncomplicated: Secondary | ICD-10-CM

## 2013-12-02 DIAGNOSIS — R1310 Dysphagia, unspecified: Secondary | ICD-10-CM

## 2013-12-02 DIAGNOSIS — I10 Essential (primary) hypertension: Secondary | ICD-10-CM | POA: Diagnosis not present

## 2013-12-02 DIAGNOSIS — K921 Melena: Secondary | ICD-10-CM

## 2013-12-02 DIAGNOSIS — F411 Generalized anxiety disorder: Secondary | ICD-10-CM | POA: Diagnosis not present

## 2013-12-02 DIAGNOSIS — Z79899 Other long term (current) drug therapy: Secondary | ICD-10-CM | POA: Diagnosis not present

## 2013-12-02 DIAGNOSIS — F17200 Nicotine dependence, unspecified, uncomplicated: Secondary | ICD-10-CM | POA: Insufficient documentation

## 2013-12-02 DIAGNOSIS — R1319 Other dysphagia: Secondary | ICD-10-CM | POA: Insufficient documentation

## 2013-12-02 DIAGNOSIS — J4489 Other specified chronic obstructive pulmonary disease: Secondary | ICD-10-CM | POA: Insufficient documentation

## 2013-12-02 DIAGNOSIS — K219 Gastro-esophageal reflux disease without esophagitis: Secondary | ICD-10-CM

## 2013-12-02 DIAGNOSIS — M79601 Pain in right arm: Secondary | ICD-10-CM

## 2013-12-02 DIAGNOSIS — R1314 Dysphagia, pharyngoesophageal phase: Secondary | ICD-10-CM

## 2013-12-02 DIAGNOSIS — K6289 Other specified diseases of anus and rectum: Secondary | ICD-10-CM

## 2013-12-02 MED ORDER — OXYCODONE-ACETAMINOPHEN 5-325 MG PO TABS: 1.0000 | ORAL_TABLET | ORAL | Status: DC | PRN

## 2013-12-02 MED ORDER — PANTOPRAZOLE SODIUM 40 MG PO TBEC: 40.0000 mg | DELAYED_RELEASE_TABLET | Freq: Every day | ORAL | Status: DC

## 2013-12-02 MED ORDER — PEG-KCL-NACL-NASULF-NA ASC-C 100 G PO SOLR: 1.0000 | ORAL | Status: DC

## 2013-12-02 NOTE — Patient Instructions (Signed)
1. Please have your blood work done. 2. Colonoscopy and upper endoscopy with Dr. Oneida Alar. See separate instructions.  3. STOP USING COCAINE. You will not be able to have the procedure if you have a positive drug screen. 4. You need to decrease alcohol consumption with goal of stopping. Alcohol and drug use is very bad for your health.

## 2013-12-02 NOTE — Discharge Instructions (Signed)
As discussed, your evaluation today has been largely reassuring.  But, it is important that you monitor your condition carefully, and do not hesitate to return to the ED if you develop new, or concerning changes in your condition. ? ?Otherwise, please follow-up with your physician for appropriate ongoing care. ? ?

## 2013-12-02 NOTE — ED Provider Notes (Signed)
CSN: [id number]     Arrival date & time 12/02/13  2019 History  This chart was scribed for Molly Muskrat, MD by Vernell Barrier, ED scribe. This patient was seen in room APA14/APA14 and the patient's care was started at 10:11 PM.    Chief Complaint  Patient presents with  . Arm Pain    The history is provided by the patient. No language interpreter was used.   HPI Comments: Molly Campbell is a 53 y.o. female who presents to the Emergency Department BIB EMS complaining of worsening right arm pain. Arrived with right arm in an immobilizer and right arm sling. Pt broke her distal radius 7/21 after a fall and is being followed by Dr. Luna Glasgow. States she has run out of pain medication. Has been drinking alcohol tonight to compensate. No loss os sensation in right digits. Notes swelling has gone down.   Past Medical History  Diagnosis Date  . Hypertension   . Tobacco abuse   . Alcohol abuse   . Cocaine abuse   . Alcoholic hepatitis 69/11/9478  . Auditory hallucinations   . Anxiety disorder   . Schizophrenia   . COPD (chronic obstructive pulmonary disease)   . Emphysema   . Chronic bronchitis with acute exacerbation   . Cancer     cervical  . History of cervical cancer 09/26/2013   Past Surgical History  Procedure Laterality Date  . Abdominal hysterectomy    . Breast biopsy    . Colonoscopy  05/07/2002    Dr.Demason- large approximately 1.5cm polyp pedunculated aproximately 10cm from the anal verge bx= adenomatous polyp  . Esophagogastroduodenoscopy  06/02/2002    Dr. Anthony Sar- mild distal esophagitis w/o stricturing or ulceration. stomach and pylorus are normal. inflammatory changes of proximal duldenum in the first portion.    Family History  Problem Relation Age of Onset  . Heart failure Other   . Alzheimer's disease Mother   . Other Father     "he was beat to death"  . Heart disease Sister   . Gout Sister   . COPD Sister   . Seizures Sister   . Diabetes Sister   .  Diabetes Brother   . Cancer Paternal Grandmother     not sure what kind  . Seizures Sister   . Other Sister     "bones are messed up"  . Hypertension Brother   . Heart attack Brother   . Other Brother     MVA  . Diabetes Brother   . Other Brother     MVA  . Alcohol abuse Daughter   . Other Son     motorcycle accident   History  Substance Use Topics  . Smoking status: Current Every Day Smoker -- 1.00 packs/day    Types: Cigarettes  . Smokeless tobacco: Former Systems developer    Types: Snuff     Comment: One pack a day  . Alcohol Use: 1.2 oz/week    2 Cans of beer per week     Comment: every day, 2-3 forty ounce beers    OB History   Grav Para Term Preterm Abortions TAB SAB Ect Mult Living   _0 Review of Systems  Constitutional:       Per HPI, otherwise negative  HENT:       Per HPI, otherwise negative  Respiratory:       Per HPI, otherwise negative  Cardiovascular:  Per HPI, otherwise negative  Gastrointestinal: Negative for vomiting.  Endocrine:       Negative aside from HPI  Genitourinary:       Neg aside from HPI   Musculoskeletal:       Per HPI, otherwise negative  Skin: Negative.   Neurological: Negative for syncope.   Allergies  Review of patient's allergies indicates no known allergies.  Home Medications   Prior to Admission medications   Medication Sig Start Date End Date Taking? Authorizing Provider  Albuterol Sulfate (PROAIR HFA IN) Inhale into the lungs as needed.    Historical Provider, MD  amLODipine (NORVASC) 5 MG tablet Take 5 mg by mouth daily.    Historical Provider, MD  citalopram (CELEXA) 10 MG tablet Take 10 mg by mouth daily.    Historical Provider, MD  ibuprofen (ADVIL,MOTRIN) 800 MG tablet Take 1 tablet (800 mg total) by mouth every 8 (eight) hours as needed. 09/26/13   Estill Dooms, NP  LORazepam (ATIVAN) 1 MG tablet Take 1 mg by mouth every 8 (eight) hours.    Historical Provider, MD  OLANZapine (ZYPREXA) 20 MG  tablet Take 20 mg by mouth daily. Pt said she takes it but not every day 03/16/11   Rexene Alberts, MD  oxyCODONE-acetaminophen (PERCOCET) 5-325 MG per tablet Take 1 tablet by mouth every 4 (four) hours as needed for moderate pain. 3/78/58   Delora Fuel, MD  pantoprazole (PROTONIX) 40 MG tablet Take 1 tablet (40 mg total) by mouth daily. 12/02/13   Mahala Menghini, PA-C  peg 3350 powder (MOVIPREP) 100 G SOLR Take 1 kit (200 g total) by mouth as directed. 12/02/13   Danie Binder, MD   Triage vitals: BP 127/79  Pulse 91  Temp(Src) 98.6 F (37 C) (Oral)  Resp 16  Ht _0  (1.575 m)  Wt 110 lb (49.896 kg)  BMI 20.11 kg/m2  SpO2 95%  Physical Exam  Nursing note and vitals reviewed. Constitutional: She is oriented to person, place, and time. She appears well-developed and well-nourished. No distress.  HENT:  Head: Normocephalic and atraumatic.  Eyes: Conjunctivae and EOM are normal.  Cardiovascular: Normal rate and regular rhythm.   Pulmonary/Chest: Effort normal and breath sounds normal. No stridor. No respiratory distress.  Abdominal: She exhibits no distension.  Musculoskeletal: She exhibits no edema.  The right arm is in a splint, sling.  Patient moves the wrist and all digits spontaneously, freely, with no discomfort. She herself states that the swelling is substantially reduced from her recent fracture.   Neurological: She is alert and oriented to person, place, and time. No cranial nerve deficit.  Skin: Skin is warm and dry.  Psychiatric: She has a normal mood and affect.    ED Course  Procedures (including critical care time)  COORDINATION OF CARE: At 10:13 PM: Discussed treatment plan with patient which includes pain medication. Patient agrees.    On repeat exam the patient is in no distress MDM   Patient with recent fracture of her arm presents with a HEENT arm that appears to be healing generally well, but with pain that is persistent patient is in no distress, is  hemodynamically stable, with appropriate neurologic function of the hand.  With no decompensation here, no new complaints, she was discharged to follow up with her orthopedic team for appropriate ongoing management.   I personally performed the services described in this documentation, which was scribed in my presence. The recorded information has been reviewed and  is accurate.     Molly Muskrat, MD 12/02/13 [phone]

## 2013-12-02 NOTE — Progress Notes (Signed)
Primary Care Physician:  Rosita Fire, MD  Primary Gastroenterologist:  Barney Drain, MD   Chief Complaint  Patient presents with  . Rectal Pain    HPI:  Molly Campbell is a 53 y.o. female here at the request of Derrek Monaco, NP for further evaluation of rectal pain and surveillance colonoscopy. She has a history of large adenomatous polyp removed in 2003 by Dr. Burke Keels. Recently unremarkable rectal exam, heme negative. No constipation, diarrhea. No recent brbpr.  Lot of heartburn. Complains of dysphagia, recently had to throw food back up. Two weeks ago had two black stools. Some pain around naval, worse when eats. Recently started ibuprofen for fractured wrist. Complains of intermittent rectal pain, described as sharp shooting which last for a few seconds. Not necessarily related to bowel movement.  Current Outpatient Prescriptions  Medication Sig Dispense Refill  . Albuterol Sulfate (PROAIR HFA IN) Inhale into the lungs as needed.      Marland Kitchen amLODipine (NORVASC) 5 MG tablet Take 5 mg by mouth daily.      . citalopram (CELEXA) 10 MG tablet Take 10 mg by mouth daily.      Marland Kitchen ibuprofen (ADVIL,MOTRIN) 800 MG tablet Take 1 tablet (800 mg total) by mouth every 8 (eight) hours as needed.  60 tablet  1  . LORazepam (ATIVAN) 1 MG tablet Take 1 mg by mouth every 8 (eight) hours.      Marland Kitchen OLANZapine (ZYPREXA) 20 MG tablet Take 20 mg by mouth daily. Pt said she takes it but not every day      . oxyCODONE-acetaminophen (PERCOCET) 5-325 MG per tablet Take 1 tablet by mouth every 4 (four) hours as needed for moderate pain.  10 tablet  0  . peg 3350 powder (MOVIPREP) 100 G SOLR Take 1 kit (200 g total) by mouth as directed.  1 kit  0   No current facility-administered medications for this visit.    Allergies as of 12/02/2013  . (No Known Allergies)    Past Medical History  Diagnosis Date  . Hypertension   . Tobacco abuse   . Alcohol abuse   . Cocaine abuse   . Alcoholic hepatitis  16/05/958  . Auditory hallucinations   . Anxiety disorder   . Schizophrenia   . COPD (chronic obstructive pulmonary disease)   . Emphysema   . Chronic bronchitis with acute exacerbation   . Cancer     cervical  . History of cervical cancer 09/26/2013    Past Surgical History  Procedure Laterality Date  . Abdominal hysterectomy    . Breast biopsy    . Colonoscopy  05/07/2002    Dr.Demason- large approximately 1.5cm polyp pedunculated aproximately 10cm from the anal verge bx= adenomatous polyp  . Esophagogastroduodenoscopy  06/02/2002    Dr. Anthony Sar- mild distal esophagitis w/o stricturing or ulceration. stomach and pylorus are normal. inflammatory changes of proximal duldenum in the first portion.     Family History  Problem Relation Age of Onset  . Heart failure Other   . Alzheimer's disease Mother   . Other Father     "he was beat to death"  . Heart disease Sister   . Gout Sister   . COPD Sister   . Seizures Sister   . Diabetes Sister   . Diabetes Brother   . Cancer Paternal Grandmother     not sure what kind  . Seizures Sister   . Other Sister     "bones are messed up"  .  Hypertension Brother   . Heart attack Brother   . Other Brother     MVA  . Diabetes Brother   . Other Brother     MVA  . Alcohol abuse Daughter   . Other Son     motorcycle accident    History   Social History  . Marital Status: Widowed    Spouse Name: N/A    Number of Children: 3  . Years of Education: N/A   Occupational History  . Not on file.   Social History Main Topics  . Smoking status: Current Every Day Smoker -- 1.00 packs/day    Types: Cigarettes  . Smokeless tobacco: Former Systems developer    Types: Snuff     Comment: One pack a day  . Alcohol Use: 1.2 oz/week    2 Cans of beer per week     Comment: every day, 2-3 forty ounce beers   . Drug Use: Yes    Special: Cocaine     Comment: smoke cocaine, last dose 7/25  . Sexual Activity: Not Currently    Birth Control/ Protection:  Surgical   Other Topics Concern  . Not on file   Social History Narrative  . No narrative on file      ROS:  General: Negative for anorexia, weight loss, fever, chills, fatigue, weakness. Eyes: Negative for vision changes.  ENT: Negative for hoarseness, difficulty swallowing , nasal congestion. CV: Negative for chest pain, angina, palpitations, dyspnea on exertion, peripheral edema.  Respiratory: Negative for dyspnea at rest, dyspnea on exertion, cough, sputum, wheezing.  GI: See history of present illness. GU:  Negative for dysuria, hematuria, urinary incontinence, urinary frequency, nocturnal urination.  MS: Negative for joint pain, low back pain. Recent wrist fracture. Derm: Negative for rash or itching.  Neuro: Negative for weakness, abnormal sensation, seizure, frequent headaches, memory loss, confusion.  Psych: Negative for anxiety, depression, suicidal ideation, hallucinations. Positive schizophrenia, managed at mental health Endo: Negative for unusual weight change.  Heme: Negative for bruising or bleeding. Allergy: Negative for rash or hives.    Physical Examination:  BP 148/99  Pulse 93  Temp(Src) 97.4 F (36.3 C) (Oral)  Ht '5\' 2"'  (1.575 m)  Wt 110 lb 12.8 oz (50.259 kg)  BMI 20.26 kg/m2   General: Well-nourished, well-developed in no acute distress.  Head: Normocephalic, atraumatic.   Eyes: Conjunctiva pink, no icterus. Mouth: Oropharyngeal mucosa moist and pink , no lesions erythema or exudate. Multiple missing teeth. Neck: Supple without thyromegaly, masses, or lymphadenopathy.  Lungs: Clear to auscultation bilaterally.  Heart: Regular rate and rhythm, no murmurs rubs or gallops.  Abdomen: Bowel sounds are normal, moderate epigastric tenderness, nondistended, no hepatosplenomegaly or masses, no abdominal bruits or    hernia , no rebound or guarding.   Rectal: Deferred Extremities: No lower extremity edema. No clubbing or deformities.  Neuro: Alert and  oriented x 4 , grossly normal neurologically.  Skin: Warm and dry, no rash or jaundice.   Psych: Alert and cooperative, normal mood and affect.  Labs: Lab Results  Component Value Date   WBC 4.9 08/02/2013   HGB 13.7 08/02/2013   HCT 39.5 08/02/2013   MCV 97.3 08/02/2013   PLT 322 08/02/2013   Lab Results  Component Value Date   CREATININE 0.70 08/02/2013   BUN 14 08/02/2013   NA 139 08/02/2013   K 3.5* 08/02/2013   CL 100 08/02/2013   CO2 27 08/02/2013   Lab Results  Component Value Date  ALT 23 08/02/2013   AST 29 08/02/2013   ALKPHOS 85 08/02/2013   BILITOT 0.4 08/02/2013     Imaging Studies:    Dg Wrist Complete Right  11/25/2013   CLINICAL DATA:  ARM INJURY  EXAM: RIGHT WRIST - COMPLETE 3+ VIEW  COMPARISON:  None.  FINDINGS: There is an acute comminuted intra-articular fracture of the distal radius with impaction and slight dorsal angulation. Additional acute nondisplaced fracture of the ulnar styloid present with associated comminution. Soft tissue swelling present about the wrist.  IMPRESSION: 1. Acute comminuted intra-articular fracture of the distal radius with dorsal angulation and impaction. 2. Acute nondisplaced comminuted ulnar styloid fracture.   Electronically Signed   By: Jeannine Boga M.D.   On: 11/25/2013 01:00

## 2013-12-02 NOTE — ED Notes (Signed)
Patient broke arm on 11/25/13. Being seen by Dr. Luna Glasgow. Complaining of pain to right arm. Patient reports has been drinking alcohol tonight, but is out of pain medication.

## 2013-12-02 NOTE — Assessment & Plan Note (Signed)
Doubt fissure based on symptoms. Possible spasms. Recommend colonoscopy predominantly for screening purposes. She will need deep sedation due to polypharmacy and etoh abuse. Stop cocaine. She will need to be drug free for procedure. Discussed at length with patient.  I have discussed the risks, alternatives, benefits with regards to but not limited to the risk of reaction to medication, bleeding, infection, perforation and the patient is agreeable to proceed. Written consent to be obtained.

## 2013-12-02 NOTE — Progress Notes (Signed)
Please let patient know that we will start her on pantoprazole 40mg  daily. RX sent to pharmacy.

## 2013-12-02 NOTE — Assessment & Plan Note (Addendum)
Multiple GI concerns. She has epigastric pain, questionable melana couple weeks ago in the setting of chronic alcohol abuse. Severe GERD associated with esophageal dysphagia, solids. Suspect significant esophagitis, cannot exclude ulcer. Discussed alcohol abuse and acute and long-term effects. Previously in a 30 day rehabilitation, began drinking as soon as she was discharged. Not interested in rehabilitation at this time. Recommend upper endoscopy for further evaluation. She will require deep sedation given polypharmacy and alcohol abuse. History of cocaine use. Advised that she has to be drug free in order to undergo procedure. She will be given few weeks to get clean.  I have discussed the risks, alternatives, benefits with regards to but not limited to the risk of reaction to medication, bleeding, infection, perforation and the patient is agreeable to proceed. Written consent to be obtained.  Start pantoprazole 40mg  daily.

## 2013-12-02 NOTE — Assessment & Plan Note (Signed)
Check cbc 

## 2013-12-02 NOTE — ED Notes (Addendum)
Pt. C/o right arm pain. Splint and sling present on assessment. Pt. Reports that she is out of pain mediation. Pt. Reports being seen by Dr. Luna Glasgow and told "it will be weeks before something can be done, and I will have to go to [facility]. Pt. Alert and oriented x4.

## 2013-12-04 ENCOUNTER — Telehealth: Payer: Self-pay | Admitting: Orthopedic Surgery

## 2013-12-04 LAB — CBC WITH DIFFERENTIAL/PLATELET
Basophils Absolute: 0.1 10*3/uL (ref 0.0–0.1)
Basophils Relative: 1 % (ref 0–1)
Eosinophils Absolute: 0.1 10*3/uL (ref 0.0–0.7)
Eosinophils Relative: 2 % (ref 0–5)
HCT: 36.5 % (ref 36.0–46.0)
Hemoglobin: 12.8 g/dL (ref 12.0–15.0)
LYMPHS ABS: 2.8 10*3/uL (ref 0.7–4.0)
LYMPHS PCT: 50 % — AB (ref 12–46)
MCH: 33 pg (ref 26.0–34.0)
MCHC: 35.1 g/dL (ref 30.0–36.0)
MCV: 94.1 fL (ref 78.0–100.0)
Monocytes Absolute: 0.6 10*3/uL (ref 0.1–1.0)
Monocytes Relative: 10 % (ref 3–12)
Neutro Abs: 2.1 10*3/uL (ref 1.7–7.7)
Neutrophils Relative %: 37 % — ABNORMAL LOW (ref 43–77)
PLATELETS: 501 10*3/uL — AB (ref 150–400)
RBC: 3.88 MIL/uL (ref 3.87–5.11)
RDW: 14.3 % (ref 11.5–15.5)
WBC: 5.6 10*3/uL (ref 4.0–10.5)

## 2013-12-04 NOTE — Progress Notes (Signed)
LMOM that Rx was sent in and to call and let me know that she got the message.

## 2013-12-04 NOTE — Telephone Encounter (Signed)
Call received from patient inquiring about appointment here, following Emergency room visit for problem of fractured wrist.  As most recent Emergency Room note indicates, patient has already been seen by Dr Luna Glasgow.  Patient did then relay that she was treated by Dr Luna Glasgow, and that he is now referring her to another specialist, in [place].  She wanted to know if she might be able to be seen here, "since it's closer."  Advised, based on her injury, that she needs to follow Dr Brooke Bonito recommendation.

## 2013-12-04 NOTE — Progress Notes (Signed)
cc'd to pcp 

## 2013-12-05 NOTE — Progress Notes (Signed)
Quick Note:  Her Hgb ok. Platelets up some, likely reactive. Patient recently seen without indication for acute infection. H/O drug use.  Repeat CBC for elevated platelets in 2 weeks. ______

## 2013-12-08 ENCOUNTER — Other Ambulatory Visit: Payer: Self-pay

## 2013-12-08 DIAGNOSIS — D691 Qualitative platelet defects: Secondary | ICD-10-CM

## 2013-12-08 NOTE — Progress Notes (Signed)
Quick Note:  LMOM for a return call. ______ 

## 2013-12-10 ENCOUNTER — Encounter (HOSPITAL_COMMUNITY): Payer: Self-pay | Admitting: Pharmacy Technician

## 2013-12-10 NOTE — Progress Notes (Signed)
Quick Note:  LM for pt to call. ______ 

## 2013-12-11 ENCOUNTER — Other Ambulatory Visit: Payer: Self-pay | Admitting: Orthopedic Surgery

## 2013-12-11 ENCOUNTER — Encounter (HOSPITAL_BASED_OUTPATIENT_CLINIC_OR_DEPARTMENT_OTHER): Payer: Self-pay | Admitting: *Deleted

## 2013-12-11 NOTE — Progress Notes (Signed)
Pt has an aid, a daughter-no phone-does cocaine almost daily-last 12/10/13-was told she has to go for a urine drug screen day before-dr crews says it has to be clean-or do surgery at [facility] or-talked with Juliann Pulse Bloome at dr [facility] Rumps may move her to [facility] or.

## 2013-12-11 NOTE — Progress Notes (Signed)
Quick Note:  Letter mailed to pt to call. ______ 

## 2013-12-12 ENCOUNTER — Encounter (HOSPITAL_COMMUNITY): Admission: RE | Admit: 2013-12-12 | Payer: Medicaid Other | Source: Ambulatory Visit

## 2013-12-15 ENCOUNTER — Encounter (HOSPITAL_COMMUNITY): Admission: RE | Admit: 2013-12-15 | Payer: Medicaid Other | Source: Ambulatory Visit

## 2013-12-15 MED ORDER — FENTANYL CITRATE 0.05 MG/ML IJ SOLN: 50.0000 ug | INTRAMUSCULAR | Status: DC | PRN

## 2013-12-15 MED ORDER — MIDAZOLAM HCL 2 MG/2ML IJ SOLN: 1.0000 mg | INTRAMUSCULAR | Status: DC | PRN

## 2013-12-15 MED ORDER — LACTATED RINGERS IV SOLN
INTRAVENOUS | Status: DC
Start: 2013-12-16 — End: 2013-12-15

## 2013-12-15 NOTE — Progress Notes (Addendum)
I called contact number and spoke with Kennyth Lose, patients aid. Kennyth Lose said she has been looking for patient all day. "I know she was suppose to have blood work done today, but she didn't do it, I don't know if they will still do surgery."  Kennyth Lose said if she finds patient she will call me.  Kennyth Lose said that Ms Rouser's transportation is supposed to put her up at [place] am.

## 2013-12-15 NOTE — Progress Notes (Signed)
Lab order was faxed to [facility]  On 12/08/2013.

## 2013-12-16 ENCOUNTER — Ambulatory Visit (HOSPITAL_BASED_OUTPATIENT_CLINIC_OR_DEPARTMENT_OTHER): Admission: RE | Admit: 2013-12-16 | Payer: Medicaid Other | Source: Ambulatory Visit | Admitting: Orthopedic Surgery

## 2013-12-16 ENCOUNTER — Telehealth: Payer: Self-pay

## 2013-12-16 HISTORY — DX: Pneumonia, unspecified organism: J18.9

## 2013-12-16 HISTORY — DX: Cardiac arrhythmia, unspecified: I49.9

## 2013-12-16 HISTORY — DX: Gastro-esophageal reflux disease without esophagitis: K21.9

## 2013-12-16 SURGERY — OPEN REDUCTION INTERNAL FIXATION (ORIF) DISTAL RADIUS FRACTURE
Anesthesia: Regional | Site: Wrist | Laterality: Right

## 2013-12-16 SURGERY — OPEN REDUCTION INTERNAL FIXATION (ORIF) DISTAL RADIUS FRACTURE
Anesthesia: General | Site: Wrist | Laterality: Right

## 2013-12-16 MED ORDER — FENTANYL CITRATE 0.05 MG/ML IJ SOLN
INTRAMUSCULAR | Status: AC
  Filled 2013-12-16: qty 5

## 2013-12-16 MED ORDER — PROPOFOL 10 MG/ML IV BOLUS
INTRAVENOUS | Status: AC
  Filled 2013-12-16: qty 20

## 2013-12-16 MED ORDER — MIDAZOLAM HCL 2 MG/2ML IJ SOLN
INTRAMUSCULAR | Status: AC
  Filled 2013-12-16: qty 2

## 2013-12-16 MED ORDER — CEFAZOLIN SODIUM-DEXTROSE 2-3 GM-% IV SOLR
INTRAVENOUS | Status: AC
  Filled 2013-12-16: qty 50

## 2013-12-16 NOTE — Progress Notes (Signed)
Spoke with patient's aid (whose phone number is listed as the patients) who advised that transportation picked patient up at 08:45 this morning and has taken patient back home. She reports that patient does not have a phone. Called Dr. Grandville Silos and he advised to cancel case. OR notified. I left a message with aid advising her of same. I also advised her that when patient's surgery was rescheduled that it would be advisable for her to come with patient to ensure that patient gets to correct location

## 2013-12-16 NOTE — Telephone Encounter (Signed)
Pt returned call and was informed of results and will go within a couple of weeks to have CBC and orders previously faxed to [facility].

## 2013-12-16 NOTE — Telephone Encounter (Signed)
Pt left Vm that she was returning my call and I called her at [phone] and [facility] for a return call. ( See results on 12/05/2013 progress notes).

## 2013-12-17 ENCOUNTER — Encounter (HOSPITAL_COMMUNITY): Payer: Self-pay

## 2013-12-17 ENCOUNTER — Encounter (HOSPITAL_COMMUNITY)
Admission: RE | Admit: 2013-12-17 | Discharge: 2013-12-17 | Disposition: A | Discharge status: Home / Self Care | Payer: Medicaid Other | Source: Ambulatory Visit | Attending: Gastroenterology | Admitting: Gastroenterology

## 2013-12-17 NOTE — Pre-Procedure Instructions (Signed)
Telephone interview completed.  Patient states that she has used cocaine in the past week.  Advised her that she would have to complete a drug screen on the day of her procedure, therefore she would need to be free of cocaine or her procedure would be cancelled.  Verbalized understanding.  Procedure instructions reviewed and advised to contact Dr Oneida Alar' office with any questions that she may have regarding her prep.  States that she has obtained her prep and has no questions at this time.

## 2013-12-23 ENCOUNTER — Encounter (HOSPITAL_COMMUNITY)
Admission: RE | Disposition: A | Discharge status: Home / Self Care | Payer: Self-pay | Source: Ambulatory Visit | Attending: Gastroenterology

## 2013-12-23 ENCOUNTER — Encounter (HOSPITAL_COMMUNITY): Payer: Self-pay

## 2013-12-23 ENCOUNTER — Telehealth: Payer: Self-pay | Admitting: Gastroenterology

## 2013-12-23 ENCOUNTER — Other Ambulatory Visit: Payer: Self-pay | Admitting: Gastroenterology

## 2013-12-23 ENCOUNTER — Ambulatory Visit (HOSPITAL_COMMUNITY)
Admission: RE | Admit: 2013-12-23 | Discharge: 2013-12-23 | Disposition: A | Discharge status: Home / Self Care | Payer: Medicaid Other | Source: Ambulatory Visit | Attending: Gastroenterology | Admitting: Gastroenterology

## 2013-12-23 ENCOUNTER — Encounter (HOSPITAL_COMMUNITY): Payer: Self-pay | Admitting: Anesthesiology

## 2013-12-23 DIAGNOSIS — Z5309 Procedure and treatment not carried out because of other contraindication: Secondary | ICD-10-CM | POA: Insufficient documentation

## 2013-12-23 DIAGNOSIS — F1010 Alcohol abuse, uncomplicated: Secondary | ICD-10-CM | POA: Insufficient documentation

## 2013-12-23 DIAGNOSIS — R1310 Dysphagia, unspecified: Secondary | ICD-10-CM | POA: Insufficient documentation

## 2013-12-23 DIAGNOSIS — J449 Chronic obstructive pulmonary disease, unspecified: Secondary | ICD-10-CM | POA: Insufficient documentation

## 2013-12-23 DIAGNOSIS — Z860100 Personal history of colon polyps, unspecified: Secondary | ICD-10-CM | POA: Insufficient documentation

## 2013-12-23 DIAGNOSIS — F411 Generalized anxiety disorder: Secondary | ICD-10-CM | POA: Insufficient documentation

## 2013-12-23 DIAGNOSIS — F1410 Cocaine abuse, uncomplicated: Secondary | ICD-10-CM | POA: Insufficient documentation

## 2013-12-23 DIAGNOSIS — K6289 Other specified diseases of anus and rectum: Secondary | ICD-10-CM | POA: Insufficient documentation

## 2013-12-23 DIAGNOSIS — I10 Essential (primary) hypertension: Secondary | ICD-10-CM | POA: Insufficient documentation

## 2013-12-23 DIAGNOSIS — Z8541 Personal history of malignant neoplasm of cervix uteri: Secondary | ICD-10-CM | POA: Diagnosis not present

## 2013-12-23 DIAGNOSIS — J4489 Other specified chronic obstructive pulmonary disease: Secondary | ICD-10-CM | POA: Insufficient documentation

## 2013-12-23 DIAGNOSIS — F17200 Nicotine dependence, unspecified, uncomplicated: Secondary | ICD-10-CM | POA: Diagnosis not present

## 2013-12-23 LAB — RAPID URINE DRUG SCREEN, HOSP PERFORMED
Amphetamines: POSITIVE — AB
BARBITURATES: NOT DETECTED
Benzodiazepines: NOT DETECTED
COCAINE: POSITIVE — AB
Opiates: NOT DETECTED
TETRAHYDROCANNABINOL: NOT DETECTED

## 2013-12-23 SURGERY — CANCELLED PROCEDURE

## 2013-12-23 MED ORDER — PEG-KCL-NACL-NASULF-NA ASC-C 100 G PO SOLR: 1.0000 | ORAL | Status: DC

## 2013-12-23 SURGICAL SUPPLY — 28 items
BLOCK BITE 60FR ADLT L/F BLUE (MISCELLANEOUS) ×1 IMPLANT
ELECT REM PT RETURN 9FT ADLT (ELECTROSURGICAL)
ELECTRODE REM PT RTRN 9FT ADLT (ELECTROSURGICAL) IMPLANT
FCP BXJMBJMB 240X2.8X (CUTTING FORCEPS)
FLOOR PAD 36X40 (MISCELLANEOUS)
FORCEP RJ3 GP 1.8X160 W-NEEDLE (CUTTING FORCEPS) IMPLANT
FORCEPS BIOP RAD 4 LRG CAP 4 (CUTTING FORCEPS) IMPLANT
FORCEPS BIOP RJ4 240 W/NDL (CUTTING FORCEPS)
FORCEPS BXJMBJMB 240X2.8X (CUTTING FORCEPS) IMPLANT
FORMALIN 10 PREFIL 20ML (MISCELLANEOUS) IMPLANT
INJECTOR/SNARE I SNARE (MISCELLANEOUS) IMPLANT
KIT CLEAN ENDO COMPLIANCE (KITS) ×1 IMPLANT
LUBRICANT JELLY 4.5OZ STERILE (MISCELLANEOUS) IMPLANT
MANIFOLD NEPTUNE II (INSTRUMENTS) ×1 IMPLANT
NDL SCLEROTHERAPY 25GX240 (NEEDLE) IMPLANT
NEEDLE SCLEROTHERAPY 25GX240 (NEEDLE) IMPLANT
PAD FLOOR 36X40 (MISCELLANEOUS) ×1 IMPLANT
PROBE APC STR FIRE (PROBE) IMPLANT
PROBE INJECTION GOLD (MISCELLANEOUS)
PROBE INJECTION GOLD 7FR (MISCELLANEOUS) IMPLANT
SNARE ROTATE MED OVAL 20MM (MISCELLANEOUS) IMPLANT
SNARE SHORT THROW 13M SML OVAL (MISCELLANEOUS) ×1 IMPLANT
SYR 50ML LL SCALE MARK (SYRINGE) IMPLANT
SYR INFLATION 60ML (SYRINGE) IMPLANT
TRAP SPECIMEN MUCOUS 40CC (MISCELLANEOUS) IMPLANT
TUBING ENDO SMARTCAP PENTAX (MISCELLANEOUS) ×1 IMPLANT
TUBING IRRIGATION ENDOGATOR (MISCELLANEOUS) ×1 IMPLANT
WATER STERILE IRR 1000ML POUR (IV SOLUTION) IMPLANT

## 2013-12-23 NOTE — Telephone Encounter (Signed)
REVIEWED.  

## 2013-12-23 NOTE — H&P (Signed)
Primary Care Physician:  Rosita Fire, MD Primary Gastroenterologist:  Dr. Oneida Alar  Pre-Procedure History & Physical: HPI:  Molly Campbell is a 53 y.o. female here for DYSPHAGIA/RECTAL PAIN/BLACK STOOL-PERSONAL HISTORY OF POLYPS.  Past Medical History  Diagnosis Date  . Hypertension   . Tobacco abuse   . Alcohol abuse   . Cocaine abuse   . Alcoholic hepatitis 67/10/7207  . Auditory hallucinations   . Anxiety disorder   . Schizophrenia   . COPD (chronic obstructive pulmonary disease)   . Emphysema   . Chronic bronchitis with acute exacerbation   . Cancer     cervical  . History of cervical cancer 09/26/2013  . 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 GERD (gastroesophageal reflux disease)   . Pneumonia     hx  . Dysrhythmia 2012    hx AF in er-cocaine-converted back NSR    Past Surgical History  Procedure Laterality Date  . Abdominal hysterectomy    . Colonoscopy  05/07/2002    Dr.Demason- large approximately 1.5cm polyp pedunculated aproximately 10cm from the anal verge bx= adenomatous polyp  . Esophagogastroduodenoscopy  06/02/2002    Dr. Anthony Sar- mild distal esophagitis w/o stricturing or ulceration. stomach and pylorus are normal. inflammatory changes of proximal duldenum in the first portion.   . Breast biopsy  2005    lt bx  . Fracture surgery Right     [place]    Prior to Admission medications   Medication Sig Start Date End Date Taking? Authorizing Provider  albuterol (PROVENTIL HFA;VENTOLIN HFA) 108 (90 BASE) MCG/ACT inhaler Inhale 1-2 puffs into the lungs every 6 (six) hours as needed for wheezing or shortness of breath.   Yes Historical Provider, MD  amLODipine (NORVASC) 5 MG tablet Take 5 mg by mouth daily.   Yes Historical Provider, MD  citalopram (CELEXA) 10 MG tablet Take 10 mg by mouth daily.   Yes Historical Provider, MD  ibuprofen (ADVIL,MOTRIN) 800 MG tablet Take 1 tablet (800 mg total) by mouth every 8 (eight) hours as needed. 09/26/13  Yes Estill Dooms, NP   LORazepam (ATIVAN) 1 MG tablet Take 1 mg by mouth every 8 (eight) hours.   Yes Historical Provider, MD  OLANZapine (ZYPREXA) 20 MG tablet Take 20 mg by mouth daily. Pt said she takes it but not every day 03/16/11  Yes Rexene Alberts, MD  oxyCODONE-acetaminophen (PERCOCET) 5-325 MG per tablet Take 1 tablet by mouth every 4 (four) hours as needed for moderate pain. 12/02/13  Yes [name], MD  pantoprazole (PROTONIX) 40 MG tablet Take 1 tablet (40 mg total) by mouth daily. 12/02/13  Yes Mahala Menghini, PA-C    Allergies as of 12/02/2013  . (No Known Allergies)    Family History  Problem Relation Age of Onset  . Heart failure Other   . Alzheimer's disease Mother   . Other Father     "he was beat to death"  . Heart disease Sister   . Gout Sister   . COPD Sister   . Seizures Sister   . Diabetes Sister   . Diabetes Brother   . Cancer Paternal Grandmother     not sure what kind  . Seizures Sister   . Other Sister     "bones are messed up"  . Hypertension Brother   . Heart attack Brother   . Other Brother     MVA  . Diabetes Brother   . Other Brother     MVA  . Alcohol  abuse Daughter   . Other Son     motorcycle accident    History   Social History  . Marital Status: Widowed    Spouse Name: N/A    Number of Children: 3  . Years of Education: N/A   Occupational History  . Not on file.   Social History Main Topics  . Smoking status: Current Every Day Smoker -- 1.00 packs/day    Types: Cigarettes  . Smokeless tobacco: Former Systems developer    Types: Snuff     Comment: One pack a day  . Alcohol Use: 1.2 oz/week    2 Cans of beer per week     Comment: every day, 2-3 forty ounce beers   . Drug Use: Yes    Special: Cocaine     Comment: smoke cocaine, last dose 7/25  . Sexual Activity: Not Currently    Birth Control/ Protection: Surgical   Other Topics Concern  . Not on file   Social History Narrative  . No narrative on file    Review of Systems: See HPI, otherwise  negative ROS   Physical Exam: BP 145/107  Pulse 81  Temp(Src) 97.5 F (36.4 C) (Oral)  Resp 22 General:   Alert,  pleasant and cooperative in NAD Head:  Normocephalic and atraumatic. Neck:  Supple; Lungs:  Clear throughout to auscultation.    Heart:  Regular rate and rhythm. Abdomen:  Soft, nontender and nondistended. Normal bowel sounds, without guarding, and without rebound.   Neurologic:  Alert and  oriented x4;  grossly normal neurologically.  Impression/Plan:     DYSPHAGIA/RECTAL PAIN/BLACK STOOL PLAN:  TCS/EGD/DIL TODAY

## 2013-12-23 NOTE — Telephone Encounter (Signed)
Message copied by Rodney Langton on Tue Dec 23, 2013 10:48 AM ------      Message from: Danie Binder      Created: Tue Dec 23, 2013  9:14 AM       UDS POS FOR AMPHETAMINES/COCAINE. [place] TCS/EGDDIL WITH MAC IN 4 WEEKS. CALL PT TWO WEEKS PRIOR TO ENDOSCOPY AND REMIND HER TO NOT USE COCAINE OR AMPHETMANINES ------

## 2013-12-23 NOTE — Progress Notes (Signed)
REVIEWED.  

## 2013-12-23 NOTE — Telephone Encounter (Signed)
TCS/EGD/ED [facility] ON Tuesday September 15th at 7:30 am I have sent her new instructions

## 2013-12-23 NOTE — Progress Notes (Signed)
Patient gave scant urine sample, sent to lab positive for cocaine. Procedure cancelled per Dr. Oneida Alar

## 2013-12-24 ENCOUNTER — Encounter (HOSPITAL_COMMUNITY): Payer: Self-pay | Admitting: Emergency Medicine

## 2013-12-24 ENCOUNTER — Emergency Department (HOSPITAL_COMMUNITY)
Admission: EM | Admit: 2013-12-24 | Discharge: 2013-12-24 | Discharge status: Against Medical Advice | Payer: Medicaid Other | Attending: Emergency Medicine | Admitting: Emergency Medicine

## 2013-12-24 DIAGNOSIS — F17200 Nicotine dependence, unspecified, uncomplicated: Secondary | ICD-10-CM | POA: Insufficient documentation

## 2013-12-24 DIAGNOSIS — I10 Essential (primary) hypertension: Secondary | ICD-10-CM | POA: Insufficient documentation

## 2013-12-24 DIAGNOSIS — M79609 Pain in unspecified limb: Secondary | ICD-10-CM | POA: Insufficient documentation

## 2013-12-24 DIAGNOSIS — J438 Other emphysema: Secondary | ICD-10-CM | POA: Diagnosis not present

## 2013-12-24 NOTE — ED Notes (Signed)
Unable to locate pt in all waiting areas x 3 

## 2013-12-24 NOTE — ED Notes (Signed)
Pt ambulating in waiting area with out difficulty.

## 2013-12-24 NOTE — ED Notes (Addendum)
Pt c/o left foot pain. No swelling noted. Pt BIB EMS. Pt was ambulatory at scene. Pt avoiding questions and becomes upset, raising voice when ask questions. Pt admits to ETOH prior to arrival and smells of ETOH.

## 2013-12-26 ENCOUNTER — Encounter (HOSPITAL_COMMUNITY): Payer: Self-pay | Admitting: Gastroenterology

## 2013-12-29 ENCOUNTER — Emergency Department (HOSPITAL_COMMUNITY): Payer: Medicaid Other

## 2013-12-29 ENCOUNTER — Encounter (HOSPITAL_COMMUNITY): Payer: Self-pay | Admitting: Emergency Medicine

## 2013-12-29 DIAGNOSIS — R443 Hallucinations, unspecified: Secondary | ICD-10-CM | POA: Insufficient documentation

## 2013-12-29 DIAGNOSIS — Z8541 Personal history of malignant neoplasm of cervix uteri: Secondary | ICD-10-CM | POA: Diagnosis not present

## 2013-12-29 DIAGNOSIS — Y939 Activity, unspecified: Secondary | ICD-10-CM | POA: Diagnosis not present

## 2013-12-29 DIAGNOSIS — Z8701 Personal history of pneumonia (recurrent): Secondary | ICD-10-CM | POA: Insufficient documentation

## 2013-12-29 DIAGNOSIS — S99919A Unspecified injury of unspecified ankle, initial encounter: Secondary | ICD-10-CM

## 2013-12-29 DIAGNOSIS — Z79899 Other long term (current) drug therapy: Secondary | ICD-10-CM | POA: Insufficient documentation

## 2013-12-29 DIAGNOSIS — J441 Chronic obstructive pulmonary disease with (acute) exacerbation: Secondary | ICD-10-CM | POA: Insufficient documentation

## 2013-12-29 DIAGNOSIS — R109 Unspecified abdominal pain: Secondary | ICD-10-CM | POA: Diagnosis not present

## 2013-12-29 DIAGNOSIS — S8990XA Unspecified injury of unspecified lower leg, initial encounter: Secondary | ICD-10-CM | POA: Diagnosis present

## 2013-12-29 DIAGNOSIS — S93409A Sprain of unspecified ligament of unspecified ankle, initial encounter: Secondary | ICD-10-CM | POA: Diagnosis not present

## 2013-12-29 DIAGNOSIS — F17200 Nicotine dependence, unspecified, uncomplicated: Secondary | ICD-10-CM | POA: Diagnosis not present

## 2013-12-29 DIAGNOSIS — K219 Gastro-esophageal reflux disease without esophagitis: Secondary | ICD-10-CM | POA: Diagnosis not present

## 2013-12-29 DIAGNOSIS — W172XXA Fall into hole, initial encounter: Secondary | ICD-10-CM | POA: Diagnosis not present

## 2013-12-29 DIAGNOSIS — F411 Generalized anxiety disorder: Secondary | ICD-10-CM | POA: Insufficient documentation

## 2013-12-29 DIAGNOSIS — S99929A Unspecified injury of unspecified foot, initial encounter: Secondary | ICD-10-CM

## 2013-12-29 DIAGNOSIS — Y929 Unspecified place or not applicable: Secondary | ICD-10-CM | POA: Insufficient documentation

## 2013-12-29 DIAGNOSIS — I10 Essential (primary) hypertension: Secondary | ICD-10-CM | POA: Diagnosis not present

## 2013-12-29 DIAGNOSIS — F209 Schizophrenia, unspecified: Secondary | ICD-10-CM | POA: Insufficient documentation

## 2013-12-29 NOTE — ED Notes (Signed)
Patient presents to ER via [facility] with c/o left ankle pain since Saturday after stepping into a hole.

## 2013-12-30 ENCOUNTER — Emergency Department (HOSPITAL_COMMUNITY)
Admission: EM | Admit: 2013-12-30 | Discharge: 2013-12-30 | Disposition: A | Discharge status: Home / Self Care | Payer: Medicaid Other | Attending: Emergency Medicine | Admitting: Emergency Medicine

## 2013-12-30 DIAGNOSIS — S93402A Sprain of unspecified ligament of left ankle, initial encounter: Secondary | ICD-10-CM

## 2013-12-30 NOTE — Discharge Instructions (Signed)

## 2013-12-30 NOTE — ED Notes (Signed)
PA at bedside.

## 2013-12-30 NOTE — ED Provider Notes (Signed)
CSN: [id number]     Arrival date & time 12/29/13  2301 History   First MD Initiated Contact with Patient 12/30/13 0048     Chief Complaint  Patient presents with  . Ankle Pain     (Consider location/radiation/quality/duration/timing/severity/associated sxs/prior Treatment) HPI Comments: Patient states that 2 or 3 days ago she accidentally stepped in a hole and injured her left ankle. She states she's been having pain and problem with the ankle since that time. She has tried Tylenol, but states this has not helped. She presents to the emergency department for evaluation concerning her ankle. She denies any recent operations or procedures involving the ankle.  Patient is a 53 y.o. female presenting with ankle pain. The history is provided by the patient.  Ankle Pain Location:  Ankle Associated symptoms: no back pain and no neck pain     Past Medical History  Diagnosis Date  . Hypertension   . Tobacco abuse   . Alcohol abuse   . Cocaine abuse   . Alcoholic hepatitis 38/01/3733  . Auditory hallucinations   . Anxiety disorder   . Schizophrenia   . COPD (chronic obstructive pulmonary disease)   . Emphysema   . Chronic bronchitis with acute exacerbation   . Cancer     cervical  . History of cervical cancer 09/26/2013  . 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 GERD (gastroesophageal reflux disease)   . Pneumonia     hx  . Dysrhythmia 2012    hx AF in er-cocaine-converted back NSR   Past Surgical History  Procedure Laterality Date  . Abdominal hysterectomy    . Colonoscopy  05/07/2002    Dr.Demason- large approximately 1.5cm polyp pedunculated aproximately 10cm from the anal verge bx= adenomatous polyp  . Esophagogastroduodenoscopy  06/02/2002    Dr. Anthony Sar- mild distal esophagitis w/o stricturing or ulceration. stomach and pylorus are normal. inflammatory changes of proximal duldenum in the first portion.   . Breast biopsy  2005    lt bx  . Fracture surgery Right     [place]   Family  History  Problem Relation Age of Onset  . Heart failure Other   . Alzheimer's disease Mother   . Other Father     "he was beat to death"  . Heart disease Sister   . Gout Sister   . COPD Sister   . Seizures Sister   . Diabetes Sister   . Diabetes Brother   . Cancer Paternal Grandmother     not sure what kind  . Seizures Sister   . Other Sister     "bones are messed up"  . Hypertension Brother   . Heart attack Brother   . Other Brother     MVA  . Diabetes Brother   . Other Brother     MVA  . Alcohol abuse Daughter   . Other Son     motorcycle accident   History  Substance Use Topics  . Smoking status: Current Every Day Smoker -- 1.00 packs/day    Types: Cigarettes  . Smokeless tobacco: Former Systems developer    Types: Snuff     Comment: One pack a day  . Alcohol Use: 1.2 oz/week    2 Cans of beer per week     Comment: every day, 2-3 forty ounce beers    OB History   Grav Para Term Preterm Abortions TAB SAB Ect Mult Living   '3 3 3       ' 3  Review of Systems  Constitutional: Negative for activity change.       All ROS Neg except as noted in HPI  Eyes: Negative for photophobia and discharge.  Respiratory: Negative for cough, shortness of breath and wheezing.   Cardiovascular: Negative for chest pain and palpitations.  Gastrointestinal: Positive for abdominal pain. Negative for blood in stool.  Genitourinary: Negative for dysuria, frequency and hematuria.  Musculoskeletal: Positive for arthralgias. Negative for back pain and neck pain.  Skin: Negative.   Neurological: Negative for dizziness, seizures and speech difficulty.  Psychiatric/Behavioral: Positive for hallucinations. Negative for confusion. The patient is nervous/anxious.       Allergies  Review of patient's allergies indicates no known allergies.  Home Medications   Prior to Admission medications   Medication Sig Start Date End Date Taking? Authorizing Provider  albuterol (PROVENTIL HFA;VENTOLIN HFA) 108  (90 BASE) MCG/ACT inhaler Inhale 1-2 puffs into the lungs every 6 (six) hours as needed for wheezing or shortness of breath.   Yes Historical Provider, MD  amLODipine (NORVASC) 5 MG tablet Take 5 mg by mouth daily.   Yes Historical Provider, MD  citalopram (CELEXA) 10 MG tablet Take 10 mg by mouth daily.   Yes Historical Provider, MD  ibuprofen (ADVIL,MOTRIN) 800 MG tablet Take 1 tablet (800 mg total) by mouth every 8 (eight) hours as needed. 09/26/13  Yes Estill Dooms, NP  LORazepam (ATIVAN) 1 MG tablet Take 1 mg by mouth every 8 (eight) hours.   Yes Historical Provider, MD  OLANZapine (ZYPREXA) 20 MG tablet Take 20 mg by mouth daily. Pt said she takes it but not every day 03/16/11  Yes Rexene Alberts, MD  oxyCODONE-acetaminophen (PERCOCET) 5-325 MG per tablet Take 1 tablet by mouth every 4 (four) hours as needed for moderate pain. 12/02/13  Yes [name], MD  pantoprazole (PROTONIX) 40 MG tablet Take 1 tablet (40 mg total) by mouth daily. 12/02/13  Yes Mahala Menghini, PA-C  peg 3350 powder (MOVIPREP) 100 G SOLR Take 1 kit (200 g total) by mouth as directed. 12/23/13  Yes Danie Binder, MD   BP 117/78  Pulse 90  Temp(Src) 99 F (37.2 C) (Oral)  Resp 20  Ht '5\' 1"'  (1.549 m)  Wt 110 lb (49.896 kg)  BMI 20.80 kg/m2  SpO2 95% Physical Exam  Nursing note and vitals reviewed. Constitutional: She is oriented to person, place, and time. She appears well-developed and well-nourished.  Non-toxic appearance.  HENT:  Head: Normocephalic.  Right Ear: Tympanic membrane and external ear normal.  Left Ear: Tympanic membrane and external ear normal.  Smell of alcohol on the breath.  Eyes: EOM and lids are normal. Pupils are equal, round, and reactive to light.  Neck: Normal range of motion. Neck supple. Carotid bruit is not present.  Cardiovascular: Normal rate, regular rhythm, normal heart sounds, intact distal pulses and normal pulses.   Pulmonary/Chest: Breath sounds normal. No respiratory  distress.  Abdominal: Soft. Bowel sounds are normal. There is no tenderness. There is no guarding.  Musculoskeletal: She exhibits tenderness.  Left lateral malleolus pain to palpation and attempted ROM.  Lymphadenopathy:       Head (right side): No submandibular adenopathy present.       Head (left side): No submandibular adenopathy present.    She has no cervical adenopathy.  Neurological: She is alert and oriented to person, place, and time. She has normal strength. No cranial nerve deficit or sensory deficit.  Skin: Skin is warm and  dry.  Psychiatric: Her speech is normal and behavior is normal.    ED Course  Procedures (including critical care time) Labs Review Labs Reviewed - No data to display  Imaging Review Dg Ankle Complete Left  12/29/2013   CLINICAL DATA:  Lateral side left ankle pain after a fall today.  EXAM: LEFT ANKLE COMPLETE - 3+ VIEW  COMPARISON:  None.  FINDINGS: There is no evidence of fracture, dislocation, or joint effusion. There is no evidence of arthropathy or other focal bone abnormality. Soft tissues are unremarkable.  IMPRESSION: Negative.   Electronically Signed   By: Lucienne Capers M.D.   On: 12/29/2013 23:27     EKG Interpretation None      MDM Is 99, pulse rate 90, respiratory rate 20, blood pressure is 117/78. Pulse oximetry is 95% on room air. Within normal limits by my interpretation. X-ray of the left ankle is read as negative by the radiologist. Ankle stirrup splint put in place. Patient has good capillary refill following the splint placement. Examination is consistent with his sprain involving the lateral malleolus. Patient is to followup with orthopedics.  Patient has a smell of alcohol on her breath, but able to ambulate with minimal problem. She is able to answer my questions and seems to be oriented to person, place, and situation.    Final diagnoses:  None    *I have reviewed nursing notes, vital signs, and all appropriate lab and  imaging results for this patient.    Lenox Ahr, PA-C 12/30/13 1606

## 2013-12-30 NOTE — ED Notes (Signed)
Pt refused to wait for any discharge instructions or paperwork.

## 2013-12-31 NOTE — ED Provider Notes (Signed)
Medical screening examination/treatment/procedure(s) were performed by non-physician practitioner and as supervising physician I was immediately available for consultation/collaboration.    Johnna Acosta, MD 12/31/13 [phone]

## 2014-01-05 ENCOUNTER — Telehealth: Payer: Self-pay

## 2014-01-05 NOTE — Telephone Encounter (Signed)
Per Dr. Oneida Alar, call pt and remind not to do drugs. She is scheduled for colonoscopy on 01/20/2014/ I called the number listed and someone said they would get in touch with her and have her call me TODAY.

## 2014-01-07 ENCOUNTER — Encounter (HOSPITAL_COMMUNITY): Payer: Self-pay | Admitting: Pharmacy Technician

## 2014-01-08 NOTE — Telephone Encounter (Signed)
LMOM that we have a very important message for her, please call.

## 2014-01-14 NOTE — Patient Instructions (Signed)
Molly Campbell  01/14/2014   Your procedure is scheduled on:  01/20/14  Report to [facility] at 6:15 AM.  Call this number if you have problems the morning of surgery: [phone]   Remember:   Do not eat food or drink liquids after midnight.   Take these medicines the morning of surgery with A SIP OF WATER:   Albuterol, Amlodipine, Celexa, Ativan, Zyprexa, Protonix, Oxycodone   DO NOT USE COCAINE AT LEAST 48 HOURS PRIOR TO YOUR PROCEDURE  A DRUG SCREEN WILL BE PERFORMED.  PROCEDURE WILL BE CANCELLED  IF POSITIVE FOR COCAINE, FOR YOUR SAFETY.   Do not wear jewelry, make-up or nail polish.  Do not wear lotions, powders, or perfumes. You may wear deodorant.  Do not shave 48 hours prior to surgery. Men may shave face and neck.  Do not bring valuables to the hospital.  [facility] is not responsible for any  belongings or valuables.               Contacts, dentures or bridgework may not be worn into surgery.  Leave suitcase in the car. After surgery it may be brought to your room.  For patients admitted to the hospital, discharge time is determined by your treatment team.               Patients discharged the day of surgery will not be allowed to drive home.  Name and phone number of your driver:   Special Instructions: N/A   Please read over the following fact sheets that you were given: Anesthesia Post-op Instructions   PATIENT INSTRUCTIONS POST-ANESTHESIA  IMMEDIATELY FOLLOWING SURGERY:  Do not drive or operate machinery for the first twenty four hours after surgery.  Do not make any important decisions for twenty four hours after surgery or while taking narcotic pain medications or sedatives.  If you develop intractable nausea and vomiting or a severe headache please notify your doctor immediately.  FOLLOW-UP:  Please make an appointment with your surgeon as instructed. You do not need to follow up with anesthesia unless specifically instructed to do so.  WOUND CARE  INSTRUCTIONS (if applicable):  Keep a dry clean dressing on the anesthesia/puncture wound site if there is drainage.  Once the wound has quit draining you may leave it open to air.  Generally you should leave the bandage intact for twenty four hours unless there is drainage.  If the epidural site drains for more than 36-48 hours please call the anesthesia department.  QUESTIONS?:  Please feel free to call your physician or the hospital operator if you have any questions, and they will be happy to assist you.      Esophagogastroduodenoscopy Esophagogastroduodenoscopy (EGD) is a procedure to examine the lining of the esophagus, stomach, and first part of the small intestine (duodenum). A long, flexible, lighted tube with a camera attached (endoscope) is inserted down the throat to view these organs. This procedure is done to detect problems or abnormalities, such as inflammation, bleeding, ulcers, or growths, in order to treat them. The procedure lasts about 5-20 minutes. It is usually an outpatient procedure, but it may need to be performed in emergency cases in the hospital. LET YOUR CAREGIVER KNOW ABOUT:   Allergies to food or medicine.  All medicines you are taking, including vitamins, herbs, eyedrops, and over-the-counter medicines and creams.  Use of steroids (by mouth or creams).  Previous problems you or members of your family have had with the use of anesthetics.  Any blood disorders you have.  Previous surgeries you have had.  Other health problems you have.  Possibility of pregnancy, if this applies. RISKS AND COMPLICATIONS  Generally, EGD is a safe procedure. However, as with any procedure, complications can occur. Possible complications include:  Infection.  Bleeding.  Tearing (perforation) of the esophagus, stomach, or duodenum.  Difficulty breathing or not being able to breath.  Excessive sweating.  Spasms of the larynx.  Slowed heartbeat.  Low blood  pressure. BEFORE THE PROCEDURE  Do not eat or drink anything for 6-8 hours before the procedure or as directed by your caregiver.  Ask your caregiver about changing or stopping your regular medicines.  If you wear dentures, be prepared to remove them before the procedure.  Arrange for someone to drive you home after the procedure. PROCEDURE   A vein will be accessed to give medicines and fluids. A medicine to relax you (sedative) and a pain reliever will be given through that access into the vein.  A numbing medicine (local anesthetic) may be sprayed on your throat for comfort and to stop you from gagging or coughing.  A mouth guard may be placed in your mouth to protect your teeth and to keep you from biting on the endoscope.  You will be asked to lie on your left side.  The endoscope is inserted down your throat and into the esophagus, stomach, and duodenum.  Air is put through the endoscope to allow your caregiver to view the lining of your esophagus clearly.  The esophagus, stomach, and duodenum is then examined. During the exam, your caregiver may:  Remove tissue to be examined under a microscope (biopsy) for inflammation, infection, or other medical problems.  Remove growths.  Remove objects (foreign bodies) that are stuck.  Treat any bleeding with medicines or other devices that stop tissues from bleeding (hot cautery, clipping devices).  Widen (dilate) or stretch narrowed areas of the esophagus and stomach.  The endoscope will then be withdrawn. AFTER THE PROCEDURE  You will be taken to a recovery area to be monitored. You will be able to go home once you are stable and alert.  Do not eat or drink anything until the local anesthetic and numbing medicines have worn off. You may choke.  It is normal to feel bloated, have pain with swallowing, or have a sore throat for a short time. This will wear off.  Your caregiver should be able to discuss his or her findings  with you. It will take longer to discuss the test results if any biopsies were taken. Document Released: 08/25/2004 Document Revised: 09/08/2013 Document Reviewed: 03/27/2012 [facility] Patient Information 2015 [place], [place]. This information is not intended to replace advice given to you by your health care provider. Make sure you discuss any questions you have with your health care provider. Colonoscopy A colonoscopy is an exam to look at the entire large intestine (colon). This exam can help find problems such as tumors, polyps, inflammation, and areas of bleeding. The exam takes about 1 hour.  LET [facility] CARE PROVIDER KNOW ABOUT:   Any allergies you have.  All medicines you are taking, including vitamins, herbs, eye drops, creams, and over-the-counter medicines.  Previous problems you or members of your family have had with the use of anesthetics.  Any blood disorders you have.  Previous surgeries you have had.  Medical conditions you have. RISKS AND COMPLICATIONS  Generally, this is a safe procedure. However, as with any procedure, complications  can occur. Possible complications include:  Bleeding.  Tearing or rupture of the colon wall.  Reaction to medicines given during the exam.  Infection (rare). BEFORE THE PROCEDURE   Ask your health care provider about changing or stopping your regular medicines.  You may be prescribed an oral bowel prep. This involves drinking a large amount of medicated liquid, starting the day before your procedure. The liquid will cause you to have multiple loose stools until your stool is almost clear or light green. This cleans out your colon in preparation for the procedure.  Do not eat or drink anything else once you have started the bowel prep, unless your health care provider tells you it is safe to do so.  Arrange for someone to drive you home after the procedure. PROCEDURE   You will be given medicine to help you relax  (sedative).  You will lie on your side with your knees bent.  A long, flexible tube with a light and camera on the end (colonoscope) will be inserted through the rectum and into the colon. The camera sends video back to a computer screen as it moves through the colon. The colonoscope also releases carbon dioxide gas to inflate the colon. This helps your health care provider see the area better.  During the exam, your health care provider may take a small tissue sample (biopsy) to be examined under a microscope if any abnormalities are found.  The exam is finished when the entire colon has been viewed. AFTER THE PROCEDURE   Do not drive for 24 hours after the exam.  You may have a small amount of blood in your stool.  You may pass moderate amounts of gas and have mild abdominal cramping or bloating. This is caused by the gas used to inflate your colon during the exam.  Ask when your test results will be ready and how you will get your results. Make sure you get your test results. Document Released: 04/21/2000 Document Revised: 02/12/2013 Document Reviewed: 12/30/2012 [facility] Patient Information 2015 [place], [place]. This information is not intended to replace advice given to you by your health care provider. Make sure you discuss any questions you have with your health care provider.

## 2014-01-15 ENCOUNTER — Encounter (HOSPITAL_COMMUNITY)
Admission: RE | Admit: 2014-01-15 | Discharge: 2014-01-15 | Disposition: A | Discharge status: Home / Self Care | Payer: Medicaid Other | Source: Ambulatory Visit | Attending: Gastroenterology | Admitting: Gastroenterology

## 2014-01-19 ENCOUNTER — Telehealth: Payer: Self-pay

## 2014-01-19 NOTE — Telephone Encounter (Signed)
Please call Molly Campbell ASAP regarding tomorrow's schedule with SF. She said that [name] is scheduled for 0730 but never showed up for her pre op and has a hx of cocaine abuse. Molly Campbell didn't know if yall wanted to try reaching her or just go ahead and cancel and move the next on schedule up.  Tried to call patient but she did not have a voice mail.

## 2014-01-20 ENCOUNTER — Encounter (HOSPITAL_COMMUNITY): Admission: RE | Payer: Self-pay | Source: Ambulatory Visit

## 2014-01-20 ENCOUNTER — Ambulatory Visit (HOSPITAL_COMMUNITY): Admission: RE | Admit: 2014-01-20 | Payer: Medicaid Other | Source: Ambulatory Visit | Admitting: Gastroenterology

## 2014-01-20 SURGERY — COLONOSCOPY WITH PROPOFOL
Anesthesia: Monitor Anesthesia Care

## 2014-02-17 ENCOUNTER — Other Ambulatory Visit (HOSPITAL_COMMUNITY): Payer: Self-pay | Admitting: Internal Medicine

## 2014-02-17 DIAGNOSIS — Z1231 Encounter for screening mammogram for malignant neoplasm of breast: Secondary | ICD-10-CM

## 2014-02-25 ENCOUNTER — Ambulatory Visit (HOSPITAL_COMMUNITY)
Admission: RE | Admit: 2014-02-25 | Discharge: 2014-02-25 | Disposition: A | Discharge status: Home / Self Care | Payer: Medicaid Other | Source: Ambulatory Visit | Attending: Internal Medicine | Admitting: Internal Medicine

## 2014-02-25 DIAGNOSIS — Z1231 Encounter for screening mammogram for malignant neoplasm of breast: Secondary | ICD-10-CM | POA: Diagnosis present

## 2014-03-09 ENCOUNTER — Encounter (HOSPITAL_COMMUNITY): Payer: Self-pay | Admitting: Emergency Medicine

## 2014-05-18 ENCOUNTER — Encounter (HOSPITAL_COMMUNITY): Payer: Self-pay | Admitting: *Deleted

## 2014-05-18 ENCOUNTER — Emergency Department (HOSPITAL_COMMUNITY)
Admission: EM | Admit: 2014-05-18 | Discharge: 2014-05-18 | Disposition: A | Discharge status: Home / Self Care | Payer: Medicaid Other | Attending: Emergency Medicine | Admitting: Emergency Medicine

## 2014-05-18 DIAGNOSIS — K219 Gastro-esophageal reflux disease without esophagitis: Secondary | ICD-10-CM | POA: Diagnosis not present

## 2014-05-18 DIAGNOSIS — M25512 Pain in left shoulder: Secondary | ICD-10-CM

## 2014-05-18 DIAGNOSIS — Y999 Unspecified external cause status: Secondary | ICD-10-CM | POA: Diagnosis not present

## 2014-05-18 DIAGNOSIS — F419 Anxiety disorder, unspecified: Secondary | ICD-10-CM | POA: Insufficient documentation

## 2014-05-18 DIAGNOSIS — I4891 Unspecified atrial fibrillation: Secondary | ICD-10-CM | POA: Insufficient documentation

## 2014-05-18 DIAGNOSIS — J449 Chronic obstructive pulmonary disease, unspecified: Secondary | ICD-10-CM | POA: Diagnosis not present

## 2014-05-18 DIAGNOSIS — I10 Essential (primary) hypertension: Secondary | ICD-10-CM | POA: Diagnosis not present

## 2014-05-18 DIAGNOSIS — X58XXXA Exposure to other specified factors, initial encounter: Secondary | ICD-10-CM | POA: Diagnosis not present

## 2014-05-18 DIAGNOSIS — Z8541 Personal history of malignant neoplasm of cervix uteri: Secondary | ICD-10-CM | POA: Diagnosis not present

## 2014-05-18 DIAGNOSIS — Y939 Activity, unspecified: Secondary | ICD-10-CM | POA: Diagnosis not present

## 2014-05-18 DIAGNOSIS — M549 Dorsalgia, unspecified: Secondary | ICD-10-CM | POA: Diagnosis present

## 2014-05-18 DIAGNOSIS — S39012A Strain of muscle, fascia and tendon of lower back, initial encounter: Secondary | ICD-10-CM | POA: Diagnosis not present

## 2014-05-18 DIAGNOSIS — Z8701 Personal history of pneumonia (recurrent): Secondary | ICD-10-CM | POA: Insufficient documentation

## 2014-05-18 DIAGNOSIS — Z79899 Other long term (current) drug therapy: Secondary | ICD-10-CM | POA: Insufficient documentation

## 2014-05-18 DIAGNOSIS — Y929 Unspecified place or not applicable: Secondary | ICD-10-CM | POA: Insufficient documentation

## 2014-05-18 DIAGNOSIS — Z720 Tobacco use: Secondary | ICD-10-CM | POA: Insufficient documentation

## 2014-05-18 MED ORDER — HYDROCODONE-ACETAMINOPHEN 5-325 MG PO TABS: 1.0000 | ORAL_TABLET | ORAL | Status: DC | PRN

## 2014-05-18 MED ORDER — IBUPROFEN 800 MG PO TABS: 800.0000 mg | ORAL_TABLET | Freq: Three times a day (TID) | ORAL | Status: DC | PRN

## 2014-05-18 MED ORDER — HYDROCODONE-ACETAMINOPHEN 5-325 MG PO TABS
2.0000 | ORAL_TABLET | Freq: Once | ORAL | Status: AC
  Administered 2014-05-18: 2 via ORAL
  Filled 2014-05-18: qty 2

## 2014-05-18 MED ORDER — IBUPROFEN 800 MG PO TABS
800.0000 mg | ORAL_TABLET | Freq: Once | ORAL | Status: AC
  Administered 2014-05-18: 800 mg via ORAL
  Filled 2014-05-18: qty 1

## 2014-05-18 NOTE — ED Notes (Signed)
[address] off porch in July, has had back pain since then , but worse since Friday. Pain in lt shoulder also.

## 2014-05-18 NOTE — ED Provider Notes (Signed)
TIME SEEN: 2:00 PM  CHIEF COMPLAINT: Back Pain  HPI:  HPI Comments: Molly Campbell is a 54 y.o. female who presents to the Emergency Department complaining of back pain that started last July after a fall but worsened 2 days ago. Patient also reports having pain in the left shoulder. She has tried Tylenol for her pain without relief. Patient denies numbness, weakness, incontinence and fever. She also denies new injuries to her back and shoulder. Patient states she has been in 2 [facility] in the past, but not recently. She did not drive herself here today.  PCP: Dr. Legrand Rams   ROS: See HPI Constitutional: no fever  Eyes: no drainage  ENT: no runny nose   Cardiovascular:  no chest pain  Resp: no SOB  GI: no vomiting GU: no dysuria Integumentary: no rash  Allergy: no hives  Musculoskeletal: no leg swelling  Neurological: no slurred speech ROS otherwise negative  PAST MEDICAL HISTORY/PAST SURGICAL HISTORY:  Past Medical History  Diagnosis Date  . Hypertension   . Tobacco abuse   . Alcohol abuse   . Cocaine abuse   . Alcoholic hepatitis 19/09/930  . Auditory hallucinations   . Anxiety disorder   . Schizophrenia   . COPD (chronic obstructive pulmonary disease)   . Emphysema   . Chronic bronchitis with acute exacerbation   . History of cervical cancer 09/26/2013  . 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 GERD (gastroesophageal reflux disease)   . Pneumonia     hx  . Dysrhythmia 2012    hx AF in er-cocaine-converted back NSR  . Cancer     cervical    MEDICATIONS:  Prior to Admission medications   Medication Sig Start Date End Date Taking? Authorizing Provider  acetaminophen (TYLENOL) 500 MG tablet Take 1,000 mg by mouth every 6 (six) hours as needed for moderate pain.   Yes Historical Provider, MD  albuterol (PROVENTIL HFA;VENTOLIN HFA) 108 (90 BASE) MCG/ACT inhaler Inhale 1-2 puffs into the lungs every 6 (six) hours as needed for wheezing or shortness of breath.   Yes Historical Provider, MD   amLODipine (NORVASC) 5 MG tablet Take 5 mg by mouth daily.   Yes Historical Provider, MD  citalopram (CELEXA) 10 MG tablet Take 10 mg by mouth daily.   Yes Historical Provider, MD  LORazepam (ATIVAN) 1 MG tablet Take 1 mg by mouth every 8 (eight) hours.   Yes Historical Provider, MD  OLANZapine (ZYPREXA) 20 MG tablet Take 20 mg by mouth daily. Pt said she takes it but not every day 03/16/11  Yes Rexene Alberts, MD  pantoprazole (PROTONIX) 40 MG tablet Take 1 tablet (40 mg total) by mouth daily. 12/02/13  Yes Mahala Menghini, PA-C  ibuprofen (ADVIL,MOTRIN) 800 MG tablet Take 1 tablet (800 mg total) by mouth every 8 (eight) hours as needed. Patient not taking: Reported on 05/18/2014 09/26/13   Estill Dooms, NP  oxyCODONE-acetaminophen (PERCOCET) 5-325 MG per tablet Take 1 tablet by mouth every 4 (four) hours as needed for moderate pain. Patient not taking: Reported on 05/18/2014 12/02/13   [name], MD    ALLERGIES:  No Known Allergies  SOCIAL HISTORY:  History  Substance Use Topics  . Smoking status: Current Every Day Smoker -- 1.00 packs/day    Types: Cigarettes  . Smokeless tobacco: Former Systems developer    Types: Snuff     Comment: One pack a day  . Alcohol Use: 1.2 oz/week    2 Cans of beer per week  Comment: every day, 2-3 forty ounce beers     FAMILY HISTORY: Family History  Problem Relation Age of Onset  . Heart failure Other   . Alzheimer's disease Mother   . Other Father     "he was beat to death"  . Heart disease Sister   . Gout Sister   . COPD Sister   . Seizures Sister   . Diabetes Sister   . Diabetes Brother   . Cancer Paternal Grandmother     not sure what kind  . Seizures Sister   . Other Sister     "bones are messed up"  . Hypertension Brother   . Heart attack Brother   . Other Brother     MVA  . Diabetes Brother   . Other Brother     MVA  . Alcohol abuse Daughter   . Other Son     motorcycle accident    EXAM: BP 118/92 mmHg  Pulse 112   Temp(Src) 98.1 F (36.7 C) (Oral)  Resp 22  Ht 5\' 2"  (1.575 m)  Wt 112 lb (50.803 kg)  BMI 20.48 kg/m2  SpO2 100% CONSTITUTIONAL: Alert and oriented and responds appropriately to questions. Well-appearing; well-nourished HEAD: Normocephalic EYES: Conjunctivae clear, PERRL ENT: normal nose; no rhinorrhea; moist mucous membranes; pharynx without lesions noted NECK: Supple, no meningismus, no LAD  CARD: RRR; S1 and S2 appreciated; no murmurs, no clicks, no rubs, no gallops RESP: Normal chest excursion without splinting or tachypnea; breath sounds clear and equal bilaterally; no wheezes, no rhonchi, no rales,  ABD/GI: Normal bowel sounds; non-distended; soft, non-tender, no rebound, no guarding BACK:  TTP diffusely throughout lower back and over paraspinal musculature. No midline spine tenderness, step off or deformity. EXT: Normal ROM in all joints; non-tender to palpation; no edema; normal capillary refill; no cyanosis; Left shoulder: difficulty moving over her head secondary to pain, no bony deformity or tenderness. No joint effusion, erythema or warmth.  SKIN: Normal color for age and race; warm NEURO: Moves all extremities equally. No clonus. 2+ DTR bilaterally upper and lower extremities. Normal sensation diffusely. Strength 5/5 in all 4 extremities. PSYCH: The patient's mood and manner are appropriate. Grooming and personal hygiene are appropriate.  MEDICAL DECISION MAKING: Patient here with left shoulder pain which is likely secondary to rotator cuff tendinitis. She is also complaining of back pain that she has had intermittently since June. No new injury. Neurologically intact. No red flag symptoms. We'll discharge with pain medication. Discussed return precautions. I do not feel she needs emergent imaging of her back or shoulder at this time.       [place], DO 05/18/14 2216

## 2014-05-18 NOTE — Discharge Instructions (Signed)
Lumbosacral Strain Lumbosacral strain is a strain of any of the parts that make up your lumbosacral vertebrae. Your lumbosacral vertebrae are the bones that make up the lower third of your backbone. Your lumbosacral vertebrae are held together by muscles and tough, fibrous tissue (ligaments).  CAUSES  A sudden blow to your back can cause lumbosacral strain. Also, anything that causes an excessive stretch of the muscles in the low back can cause this strain. This is typically seen when people exert themselves strenuously, fall, lift heavy objects, bend, or crouch repeatedly. RISK FACTORS  Physically demanding work.  Participation in pushing or pulling sports or sports that require a sudden twist of the back (tennis, golf, baseball).  Weight lifting.  Excessive lower back curvature.  Forward-tilted pelvis.  Weak back or abdominal muscles or both.  Tight hamstrings. SIGNS AND SYMPTOMS  Lumbosacral strain may cause pain in the area of your injury or pain that moves (radiates) down your leg.  DIAGNOSIS Your health care provider can often diagnose lumbosacral strain through a physical exam. In some cases, you may need tests such as X-ray exams.  TREATMENT  Treatment for your lower back injury depends on many factors that your clinician will have to evaluate. However, most treatment will include the use of anti-inflammatory medicines. HOME CARE INSTRUCTIONS   Avoid hard physical activities (tennis, racquetball, waterskiing) if you are not in proper physical condition for it. This may aggravate or create problems.  If you have a back problem, avoid sports requiring sudden body movements. Swimming and walking are generally safer activities.  Maintain good posture.  Maintain a healthy weight.  For acute conditions, you may put ice on the injured area.  Put ice in a plastic bag.  Place a towel between your skin and the bag.  Leave the ice on for 20 minutes, 2-3 times a day.  When the  low back starts healing, stretching and strengthening exercises may be recommended. SEEK MEDICAL CARE IF:  Your back pain is getting worse.  You experience severe back pain not relieved with medicines. SEEK IMMEDIATE MEDICAL CARE IF:   You have numbness, tingling, weakness, or problems with the use of your arms or legs.  There is a change in bowel or bladder control.  You have increasing pain in any area of the body, including your belly (abdomen).  You notice shortness of breath, dizziness, or feel faint.  You feel sick to your stomach (nauseous), are throwing up (vomiting), or become sweaty.  You notice discoloration of your toes or legs, or your feet get very cold. MAKE SURE YOU:   Understand these instructions.  Will watch your condition.  Will get help right away if you are not doing well or get worse. Document Released: 02/01/2005 Document Revised: 04/29/2013 Document Reviewed: 12/11/2012 [facility] Patient Information 2015 [place], [place]. This information is not intended to replace advice given to you by your health care provider. Make sure you discuss any questions you have with your health care provider.   Rotator Cuff Tendinitis  Rotator cuff tendinitis is inflammation of the tough, cord-like bands that connect muscle to bone (tendons) in your rotator cuff. Your rotator cuff is the collection of all the muscles and tendons that connect your arm to your shoulder. Your rotator cuff holds the head of your upper arm bone (humerus) in the cup (fossa) of your shoulder blade (scapula). CAUSES Rotator cuff tendinitis is usually caused by overusing the joint involved.  SIGNS AND SYMPTOMS  Deep ache in  the shoulder also felt on the outside upper arm over the shoulder muscle.  Point tenderness over the area that is injured.  Pain comes on gradually and becomes worse with lifting the arm to the side (abduction) or turning it inward (internal rotation).  May lead to a chronic  tear: When a rotator cuff tendon becomes inflamed, it runs the risk of losing its blood supply, causing some tendon fibers to die. This increases the risk that the tendon can fray and partially or completely tear. DIAGNOSIS Rotator cuff tendinitis is diagnosed by taking a medical history, performing a physical exam, and reviewing results of imaging exams. The medical history is useful to help determine the type of rotator cuff injury. The physical exam will include looking at the injured shoulder, feeling the injured area, and watching you do range-of-motion exercises. X-ray exams are typically done to rule out other causes of shoulder pain, such as fractures. MRI is the imaging exam usually used for significant shoulder injuries. Sometimes a dye study called CT arthrogram is done, but it is not as widely used as MRI. In some institutions, special ultrasound tests may also be used to aid in the diagnosis. TREATMENT  Less Severe Cases  Use of a sling to rest the shoulder for a short period of time. Prolonged use of the sling can cause stiffness, weakness, and loss of motion of the shoulder joint.  Anti-inflammatory medicines, such as ibuprofen or naproxen sodium, may be prescribed. More Severe Cases  Physical therapy.  Use of steroid injections into the shoulder joint.  Surgery. HOME CARE INSTRUCTIONS   Use a sling or splint until the pain decreases. Prolonged use of the sling can cause stiffness, weakness, and loss of motion of the shoulder joint.  Apply ice to the injured area:  Put ice in a plastic bag.  Place a towel between your skin and the bag.  Leave the ice on for 20 minutes, 2-3 times a day.  Try to avoid use other than gentle range of motion while your shoulder is painful. Use the shoulder and exercise only as directed by your health care provider. Stop exercises or range of motion if pain or discomfort increases, unless directed otherwise by your health care provider.  Only  take over-the-counter or prescription medicines for pain, discomfort, or fever as directed by your health care provider.  If you were given a shoulder sling and straps (immobilizer), do not remove it except as directed, or until you see a health care provider for a follow-up exam. If you need to remove it, move your arm as little as possible or as directed.  You may want to sleep on several pillows at night to lessen swelling and pain. SEEK IMMEDIATE MEDICAL CARE IF:   Your shoulder pain increases or new pain develops in your arm, hand, or fingers and is not relieved with medicines.  You have new, unexplained symptoms, especially increased numbness in the hands or loss of strength.  You develop any worsening of the problems that brought you in for care.  Your arm, hand, or fingers are numb or tingling.  Your arm, hand, or fingers are swollen, painful, or turn white or blue. MAKE SURE YOU:  Understand these instructions.  Will watch your condition.  Will get help right away if you are not doing well or get worse. Document Released: 07/15/2003 Document Revised: 02/12/2013 Document Reviewed: 12/04/2012 [facility] Patient Information 2015 [place], [place]. This information is not intended to replace advice given to you  by your health care provider. Make sure you discuss any questions you have with your health care provider.

## 2014-05-21 ENCOUNTER — Encounter (HOSPITAL_COMMUNITY): Payer: Self-pay | Admitting: Gastroenterology

## 2014-07-12 ENCOUNTER — Emergency Department (HOSPITAL_COMMUNITY)
Admission: EM | Admit: 2014-07-12 | Discharge: 2014-07-12 | Disposition: A | Discharge status: Home / Self Care | Payer: Medicaid Other | Attending: Emergency Medicine | Admitting: Emergency Medicine

## 2014-07-12 ENCOUNTER — Encounter (HOSPITAL_COMMUNITY): Payer: Self-pay | Admitting: Emergency Medicine

## 2014-07-12 DIAGNOSIS — Z79899 Other long term (current) drug therapy: Secondary | ICD-10-CM | POA: Diagnosis not present

## 2014-07-12 DIAGNOSIS — F209 Schizophrenia, unspecified: Secondary | ICD-10-CM | POA: Insufficient documentation

## 2014-07-12 DIAGNOSIS — G8929 Other chronic pain: Secondary | ICD-10-CM

## 2014-07-12 DIAGNOSIS — Z8701 Personal history of pneumonia (recurrent): Secondary | ICD-10-CM | POA: Insufficient documentation

## 2014-07-12 DIAGNOSIS — F410 Panic disorder [episodic paroxysmal anxiety] without agoraphobia: Secondary | ICD-10-CM | POA: Diagnosis not present

## 2014-07-12 DIAGNOSIS — J449 Chronic obstructive pulmonary disease, unspecified: Secondary | ICD-10-CM | POA: Diagnosis not present

## 2014-07-12 DIAGNOSIS — Z720 Tobacco use: Secondary | ICD-10-CM | POA: Diagnosis not present

## 2014-07-12 DIAGNOSIS — K219 Gastro-esophageal reflux disease without esophagitis: Secondary | ICD-10-CM | POA: Insufficient documentation

## 2014-07-12 DIAGNOSIS — Z8541 Personal history of malignant neoplasm of cervix uteri: Secondary | ICD-10-CM | POA: Diagnosis not present

## 2014-07-12 DIAGNOSIS — M545 Low back pain: Secondary | ICD-10-CM | POA: Diagnosis not present

## 2014-07-12 DIAGNOSIS — I10 Essential (primary) hypertension: Secondary | ICD-10-CM | POA: Diagnosis not present

## 2014-07-12 DIAGNOSIS — M549 Dorsalgia, unspecified: Secondary | ICD-10-CM | POA: Diagnosis present

## 2014-07-12 MED ORDER — CYCLOBENZAPRINE HCL 10 MG PO TABS
10.0000 mg | ORAL_TABLET | Freq: Once | ORAL | Status: AC
  Administered 2014-07-12: 10 mg via ORAL
  Filled 2014-07-12: qty 1

## 2014-07-12 MED ORDER — CYCLOBENZAPRINE HCL 5 MG PO TABS: 5.0000 mg | ORAL_TABLET | Freq: Three times a day (TID) | ORAL | Status: DC | PRN

## 2014-07-12 NOTE — ED Provider Notes (Signed)
CSN: [id number]     Arrival date & time 07/12/14  1150 History  This chart was scribed for Evalee Jefferson, PA, working with Nyra Jabs, DO by Starleen Arms, ED Scribe. This patient was seen in room APFT20/APFT20 and the patient's care was started at 12:09 PM.   Chief Complaint  Patient presents with  . Back Pain   The history is provided by the patient. No language interpreter was used.   HPI Comments: Molly Campbell is a 54 y.o. female with a history of HTN, schizophrenia, copd and polysubstance abuse who presents to the Emergency Department complaining of worsening lower bilateral pack pain onset 11/26/13 after she fell from a 3 ft high porch and landing on her right wrist.  Patient reports she was treated for her wrist injury in [place] but has never been formally evaluated for her back pain which has only been relieved by hydrocodone in the past.  She reports that she is a current alcohol user and had a 12 oz can of beer this morning while her most recent use of cocaine was 2 weeks ago.  She reports 3 clustered episodes of bowel incontinence 2 weeks ago but denies any since that time.   Patient denies leg weakness, other falls, persistent bowel incontinence, bladder incontinence.  Patient has a an allergy to ibuprofen, causing emesis and tachycardia.    PCP: [place]  Past Medical History  Diagnosis Date  . Hypertension   . Tobacco abuse   . Alcohol abuse   . Cocaine abuse   . Alcoholic hepatitis 41/01/6221  . Auditory hallucinations   . Anxiety disorder   . Schizophrenia   . COPD (chronic obstructive pulmonary disease)   . Emphysema   . Chronic bronchitis with acute exacerbation   . History of cervical cancer 09/26/2013  . 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 GERD (gastroesophageal reflux disease)   . Pneumonia     hx  . Dysrhythmia 2012    hx AF in er-cocaine-converted back NSR  . Cancer     cervical   Past Surgical History  Procedure Laterality Date  . Abdominal hysterectomy    .  Colonoscopy  05/07/2002    Dr.Demason- large approximately 1.5cm polyp pedunculated aproximately 10cm from the anal verge bx= adenomatous polyp  . Esophagogastroduodenoscopy  06/02/2002    Dr. Anthony Sar- mild distal esophagitis w/o stricturing or ulceration. stomach and pylorus are normal. inflammatory changes of proximal duldenum in the first portion.   . Fracture surgery Right     [place]  . Breast biopsy  2005    lt bx   Family History  Problem Relation Age of Onset  . Heart failure Other   . Alzheimer's disease Mother   . Other Father     "he was beat to death"  . Heart disease Sister   . Gout Sister   . COPD Sister   . Seizures Sister   . Diabetes Sister   . Diabetes Brother   . Cancer Paternal Grandmother     not sure what kind  . Seizures Sister   . Other Sister     "bones are messed up"  . Hypertension Brother   . Heart attack Brother   . Other Brother     MVA  . Diabetes Brother   . Other Brother     MVA  . Alcohol abuse Daughter   . Other Son     motorcycle accident   History  Substance Use Topics  .  Smoking status: Current Every Day Smoker -- 1.00 packs/day    Types: Cigarettes  . Smokeless tobacco: Former Systems developer    Types: Snuff     Comment: One pack a day  . Alcohol Use: 1.2 oz/week    2 Cans of beer per week     Comment: 2 cans a day   OB History    Gravida Para Term Preterm AB TAB SAB Ectopic Multiple Living   3 3 3       3      Review of Systems  Constitutional: Negative for fever.  Respiratory: Negative for shortness of breath.   Cardiovascular: Negative for chest pain and leg swelling.  Gastrointestinal: Negative for abdominal pain, constipation and abdominal distention.  Genitourinary: Negative for dysuria, urgency, frequency, flank pain and difficulty urinating.  Musculoskeletal: Positive for back pain. Negative for joint swelling and gait problem.  Skin: Negative for rash.  Neurological: Negative for weakness and numbness.  All other  systems reviewed and are negative.     Allergies  Ibuprofen  Home Medications   Prior to Admission medications   Medication Sig Start Date End Date Taking? Authorizing Provider  acetaminophen (TYLENOL) 500 MG tablet Take 1,000 mg by mouth every 6 (six) hours as needed for moderate pain.    Historical Provider, MD  albuterol (PROVENTIL HFA;VENTOLIN HFA) 108 (90 BASE) MCG/ACT inhaler Inhale 1-2 puffs into the lungs every 6 (six) hours as needed for wheezing or shortness of breath.    Historical Provider, MD  amLODipine (NORVASC) 5 MG tablet Take 5 mg by mouth daily.    Historical Provider, MD  citalopram (CELEXA) 10 MG tablet Take 10 mg by mouth daily.    Historical Provider, MD  cyclobenzaprine (FLEXERIL) 5 MG tablet Take 1 tablet (5 mg total) by mouth 3 (three) times daily as needed for muscle spasms. 07/12/14   Evalee Jefferson, PA-C  HYDROcodone-acetaminophen (NORCO/VICODIN) 5-325 MG per tablet Take 1 tablet by mouth every 4 (four) hours as needed. 05/18/14   Kristen N Ward, DO  ibuprofen (ADVIL,MOTRIN) 800 MG tablet Take 1 tablet (800 mg total) by mouth every 8 (eight) hours as needed for mild pain. 05/18/14   Kristen N Ward, DO  LORazepam (ATIVAN) 1 MG tablet Take 1 mg by mouth every 8 (eight) hours.    Historical Provider, MD  OLANZapine (ZYPREXA) 20 MG tablet Take 20 mg by mouth daily. Pt said she takes it but not every day 03/16/11   Rexene Alberts, MD  pantoprazole (PROTONIX) 40 MG tablet Take 1 tablet (40 mg total) by mouth daily. 12/02/13   Mahala Menghini, PA-C   BP 133/94 mmHg  Pulse 97  Temp(Src) 97.8 F (36.6 C) (Oral)  Resp 16  Ht 5\' 2"  (1.575 m)  Wt 112 lb (50.803 kg)  BMI 20.48 kg/m2  SpO2 95% Physical Exam  Constitutional: She is oriented to person, place, and time. She appears well-developed and well-nourished. No distress.  Pt with slurred speech which she reports due to not having her upper dentures in.  She does not smell of etoh.  HENT:  Head: Normocephalic and  atraumatic.  Eyes: Conjunctivae and EOM are normal.  Neck: Normal range of motion. Neck supple. No tracheal deviation present.  Cardiovascular: Normal rate and intact distal pulses.   Pedal pulses normal.  Pulmonary/Chest: Effort normal. No respiratory distress.  Abdominal: Soft. Bowel sounds are normal. She exhibits no distension and no mass.  Musculoskeletal: Normal range of motion. She exhibits no edema.  Lumbar back: She exhibits tenderness. She exhibits no swelling, no edema and no spasm.  Neurological: She is alert and oriented to person, place, and time. She has normal strength. She displays no atrophy and no tremor. No sensory deficit. Gait normal.  Reflex Scores:      Patellar reflexes are 2+ on the right side and 2+ on the left side.      Achilles reflexes are 2+ on the right side and 2+ on the left side. No strength deficit noted in hip and knee flexor and extensor muscle groups.  Ankle flexion and extension intact.  Skin: Skin is warm and dry.  Psychiatric: She has a normal mood and affect. Her behavior is normal.  Nursing note and vitals reviewed.   ED Course  Procedures (including critical care time)  DIAGNOSTIC STUDIES: Oxygen Saturation is 95% on RA, normal by my interpretation.    COORDINATION OF CARE:  12:24 PM Discussed treatment plan with patient at bedside.  Patient acknowledges and agrees with plan.    Labs Review Labs Reviewed - No data to display  Imaging Review No results found.   EKG Interpretation None      MDM   Final diagnoses:  Chronic low back pain    Pt with acute on chronic low back pain without new injury.  I suspect she has had more than 12 oz of beer this am, but she is ambulatory and family will be driving her home.  She was prescribed flexeril, advised heat tx to low back, activity as tolerated. F/u with pcp for a recheck in 1 week if not improving.  No neuro deficit on exam or by history to suggest emergent or surgical  presentation.  Also discussed worsened sx that should prompt immediate re-evaluation including distal weakness, bowel/bladder retention/incontinence.   I personally performed the services described in this documentation, which was scribed in my presence. The recorded information has been reviewed and is accurate.   Evalee Jefferson, PA-C 07/14/14 [place], DO 07/14/14 [id number]

## 2014-07-12 NOTE — ED Notes (Signed)
P c/o lower back pain with no new injury worsening x2 weeks. PT denies any urinary symptoms.

## 2014-07-12 NOTE — Discharge Instructions (Signed)
Back Pain, Adult Back pain is very common. The pain often gets better over time. The cause of back pain is usually not dangerous. Most people can learn to manage their back pain on their own.  HOME CARE   Stay active. Start with short walks on flat ground if you can. Try to walk farther each day.  Do not sit, drive, or stand in one place for more than 30 minutes. Do not stay in bed.  Do not avoid exercise or work. Activity can help your back heal faster.  Be careful when you bend or lift an object. Bend at your knees, keep the object close to you, and do not twist.  Sleep on a firm mattress. Lie on your side, and bend your knees. If you lie on your back, put a pillow under your knees.  Only take medicines as told by your doctor.  Put ice on the injured area.  Put ice in a plastic bag.  Place a towel between your skin and the bag.  Leave the ice on for 15-20 minutes, 03-04 times a day for the first 2 to 3 days. After that, you can switch between ice and heat packs.  Ask your doctor about back exercises or massage.  Avoid feeling anxious or stressed. Find good ways to deal with stress, such as exercise. GET HELP RIGHT AWAY IF:   Your pain does not go away with rest or medicine.  Your pain does not go away in 1 week.  You have new problems.  You do not feel well.  The pain spreads into your legs.  You cannot control when you poop (bowel movement) or pee (urinate).  Your arms or legs feel weak or lose feeling (numbness).  You feel sick to your stomach (nauseous) or throw up (vomit).  You have belly (abdominal) pain.  You feel like you may pass out (faint). MAKE SURE YOU:   Understand these instructions.  Will watch your condition.  Will get help right away if you are not doing well or get worse. Document Released: 10/11/2007 Document Revised: 07/17/2011 Document Reviewed: 08/26/2013 [facility] Patient Information 2015 [place], [place]. This information is not intended  to replace advice given to you by your health care provider. Make sure you discuss any questions you have with your health care provider.   Use the muscle relaxer for your back pain - do not drive within 4 hours of taking this as this can make you drowsy.  Avoid lifting,  Bending,  Twisting or any other activity that worsens your pain over the next week.  Apply an  icepack  to your lower back for 10-15 minutes every 2 hours for the next 2 days.  You should get rechecked if your symptoms are not better over the next 5 days,  Or you develop increased pain,  Weakness in your leg(s) or loss of bladder or bowel function - these are symptoms of a worse injury.

## 2014-08-18 ENCOUNTER — Encounter (HOSPITAL_COMMUNITY): Payer: Self-pay | Admitting: Emergency Medicine

## 2014-08-18 ENCOUNTER — Emergency Department (HOSPITAL_COMMUNITY)
Admission: EM | Admit: 2014-08-18 | Discharge: 2014-08-18 | Disposition: A | Discharge status: Home / Self Care | Payer: Medicaid Other | Attending: Emergency Medicine | Admitting: Emergency Medicine

## 2014-08-18 DIAGNOSIS — M62830 Muscle spasm of back: Secondary | ICD-10-CM | POA: Insufficient documentation

## 2014-08-18 DIAGNOSIS — J410 Simple chronic bronchitis: Secondary | ICD-10-CM

## 2014-08-18 DIAGNOSIS — J449 Chronic obstructive pulmonary disease, unspecified: Secondary | ICD-10-CM | POA: Diagnosis not present

## 2014-08-18 DIAGNOSIS — G8929 Other chronic pain: Secondary | ICD-10-CM | POA: Insufficient documentation

## 2014-08-18 DIAGNOSIS — Z760 Encounter for issue of repeat prescription: Secondary | ICD-10-CM | POA: Insufficient documentation

## 2014-08-18 DIAGNOSIS — Z79899 Other long term (current) drug therapy: Secondary | ICD-10-CM | POA: Diagnosis not present

## 2014-08-18 DIAGNOSIS — Z8541 Personal history of malignant neoplasm of cervix uteri: Secondary | ICD-10-CM | POA: Diagnosis not present

## 2014-08-18 DIAGNOSIS — M546 Pain in thoracic spine: Secondary | ICD-10-CM | POA: Insufficient documentation

## 2014-08-18 DIAGNOSIS — Z792 Long term (current) use of antibiotics: Secondary | ICD-10-CM | POA: Insufficient documentation

## 2014-08-18 DIAGNOSIS — K219 Gastro-esophageal reflux disease without esophagitis: Secondary | ICD-10-CM | POA: Insufficient documentation

## 2014-08-18 DIAGNOSIS — Z8701 Personal history of pneumonia (recurrent): Secondary | ICD-10-CM | POA: Diagnosis not present

## 2014-08-18 DIAGNOSIS — M549 Dorsalgia, unspecified: Secondary | ICD-10-CM

## 2014-08-18 DIAGNOSIS — F419 Anxiety disorder, unspecified: Secondary | ICD-10-CM | POA: Insufficient documentation

## 2014-08-18 DIAGNOSIS — I10 Essential (primary) hypertension: Secondary | ICD-10-CM | POA: Diagnosis not present

## 2014-08-18 DIAGNOSIS — I499 Cardiac arrhythmia, unspecified: Secondary | ICD-10-CM | POA: Insufficient documentation

## 2014-08-18 DIAGNOSIS — F209 Schizophrenia, unspecified: Secondary | ICD-10-CM | POA: Diagnosis not present

## 2014-08-18 DIAGNOSIS — M545 Low back pain: Secondary | ICD-10-CM | POA: Insufficient documentation

## 2014-08-18 MED ORDER — ALBUTEROL SULFATE HFA 108 (90 BASE) MCG/ACT IN AERS
1.0000 | INHALATION_SPRAY | Freq: Four times a day (QID) | RESPIRATORY_TRACT | Status: AC | PRN
Start: 2014-08-18 — End: ?

## 2014-08-18 MED ORDER — CYCLOBENZAPRINE HCL 5 MG PO TABS: 5.0000 mg | ORAL_TABLET | Freq: Three times a day (TID) | ORAL | Status: DC | PRN

## 2014-08-18 NOTE — ED Provider Notes (Signed)
CSN: [id number]     Arrival date & time 08/18/14  1800 History   First MD Initiated Contact with Patient 08/18/14 1819     Chief Complaint  Patient presents with  . Pain      (Consider location/radiation/quality/duration/timing/severity/associated sxs/prior Treatment) Patient is a 54 y.o. female presenting with back pain. The history is provided by the patient.  Back Pain Location:  Lumbar spine and thoracic spine Quality:  Aching Pain severity:  Severe Pain is:  Same all the time Timing:  Constant Chronicity:  Chronic Relieved by:  None tried Worsened by:  Ambulation and movement Ineffective treatments: tylenol. Associated symptoms: no abdominal pain, no bladder incontinence, no bowel incontinence, no dysuria and no fever    Molly Campbell is a 54 y.o. female with hx of cocaine abuse and multiple medical problems. states she last used cocaine a month ago but she has been smoking marijuana. She presents to the ED with chronic back pain. She states the pain is in the entire back. She also request refill of her inhaler. Patient smells of ETOH and admits to 24 oz beer prior to arrival.   Past Medical History  Diagnosis Date  . Hypertension   . Tobacco abuse   . Alcohol abuse   . Cocaine abuse   . Alcoholic hepatitis 42/10/8339  . Auditory hallucinations   . Anxiety disorder   . Schizophrenia   . COPD (chronic obstructive pulmonary disease)   . Emphysema   . Chronic bronchitis with acute exacerbation   . History of cervical cancer 09/26/2013  . 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 GERD (gastroesophageal reflux disease)   . Pneumonia     hx  . Dysrhythmia 2012    hx AF in er-cocaine-converted back NSR  . Cancer     cervical   Past Surgical History  Procedure Laterality Date  . Abdominal hysterectomy    . Colonoscopy  05/07/2002    Dr.Demason- large approximately 1.5cm polyp pedunculated aproximately 10cm from the anal verge bx= adenomatous polyp  . Esophagogastroduodenoscopy   06/02/2002    Dr. Anthony Sar- mild distal esophagitis w/o stricturing or ulceration. stomach and pylorus are normal. inflammatory changes of proximal duldenum in the first portion.   . Fracture surgery Right     [place]  . Breast biopsy  2005    lt bx   Family History  Problem Relation Age of Onset  . Heart failure Other   . Alzheimer's disease Mother   . Other Father     "he was beat to death"  . Heart disease Sister   . Gout Sister   . COPD Sister   . Seizures Sister   . Diabetes Sister   . Diabetes Brother   . Cancer Paternal Grandmother     not sure what kind  . Seizures Sister   . Other Sister     "bones are messed up"  . Hypertension Brother   . Heart attack Brother   . Other Brother     MVA  . Diabetes Brother   . Other Brother     MVA  . Alcohol abuse Daughter   . Other Son     motorcycle accident   History  Substance Use Topics  . Smoking status: Current Every Day Smoker -- 1.00 packs/day    Types: Cigarettes  . Smokeless tobacco: Former Systems developer    Types: Snuff     Comment: One pack a day  . Alcohol Use: 1.2 oz/week  2 Cans of beer per week     Comment: 2 cans a day   OB History    Gravida Para Term Preterm AB TAB SAB Ectopic Multiple Living   3 3 3       3      Review of Systems  Constitutional: Negative for fever and chills.  HENT: Positive for congestion.   Eyes: Negative for visual disturbance.  Respiratory: Positive for cough.        Hx of COPD  Gastrointestinal: Negative for nausea, vomiting, abdominal pain and bowel incontinence.  Genitourinary: Negative for bladder incontinence, dysuria, urgency and frequency.  Musculoskeletal: Positive for back pain.  Skin: Negative for rash.  Psychiatric/Behavioral:       Hx of mental health problems and takes medication      Allergies  Ibuprofen  Home Medications   Prior to Admission medications   Medication Sig Start Date End Date Taking? Authorizing Provider  acetaminophen (TYLENOL) 500 MG  tablet Take 1,000 mg by mouth every 6 (six) hours as needed for moderate pain.    Historical Provider, MD  albuterol (PROVENTIL HFA;VENTOLIN HFA) 108 (90 BASE) MCG/ACT inhaler Inhale 1-2 puffs into the lungs every 6 (six) hours as needed for wheezing or shortness of breath. 08/18/14   Hope Bunnie Pion, NP  amLODipine (NORVASC) 5 MG tablet Take 5 mg by mouth daily.    Historical Provider, MD  citalopram (CELEXA) 10 MG tablet Take 10 mg by mouth daily.    Historical Provider, MD  cyclobenzaprine (FLEXERIL) 5 MG tablet Take 1 tablet (5 mg total) by mouth 3 (three) times daily as needed for muscle spasms. 08/18/14   Hope Bunnie Pion, NP  HYDROcodone-acetaminophen (NORCO/VICODIN) 5-325 MG per tablet Take 1 tablet by mouth every 4 (four) hours as needed. 05/18/14   Kristen N Ward, DO  ibuprofen (ADVIL,MOTRIN) 800 MG tablet Take 1 tablet (800 mg total) by mouth every 8 (eight) hours as needed for mild pain. 05/18/14   Kristen N Ward, DO  LORazepam (ATIVAN) 1 MG tablet Take 1 mg by mouth every 8 (eight) hours.    Historical Provider, MD  OLANZapine (ZYPREXA) 20 MG tablet Take 20 mg by mouth daily. Pt said she takes it but not every day 03/16/11   Rexene Alberts, MD  pantoprazole (PROTONIX) 40 MG tablet Take 1 tablet (40 mg total) by mouth daily. 12/02/13   Mahala Menghini, PA-C   BP 120/76 mmHg  Pulse 99  Temp(Src) 98.2 F (36.8 C) (Oral)  Resp 22  Ht 5\' 2"  (1.575 m)  Wt 110 lb (49.896 kg)  BMI 20.11 kg/m2  SpO2 96% Physical Exam  Constitutional: She is oriented to person, place, and time. She appears well-developed and well-nourished. No distress.  HENT:  Head: Normocephalic and atraumatic.  Right Ear: Tympanic membrane normal.  Left Ear: Tympanic membrane normal.  Nose: Nose normal.  Mouth/Throat: Uvula is midline, oropharynx is clear and moist and mucous membranes are normal.  Eyes: EOM are normal.  Neck: Normal range of motion. Neck supple.  Cardiovascular: Normal rate and regular rhythm.    Pulmonary/Chest: Effort normal. Wheezes: occasional.  Abdominal: Soft. Bowel sounds are normal. There is no tenderness.  Musculoskeletal: Normal range of motion.       Lumbar back: She exhibits tenderness, pain and spasm. She exhibits normal pulse.  Neurological: She is alert and oriented to person, place, and time. She has normal strength. No cranial nerve deficit or sensory deficit. Gait normal.  Reflex Scores:  Bicep reflexes are 2+ on the right side and 2+ on the left side.      Brachioradialis reflexes are 2+ on the right side and 2+ on the left side.      Patellar reflexes are 2+ on the right side and 2+ on the left side.      Achilles reflexes are 2+ on the right side and 2+ on the left side. Ambulatory with steady gait, no foot drag.   Skin: Skin is warm and dry.  Psychiatric: She has a normal mood and affect. Her behavior is normal.  Nursing note and vitals reviewed.   ED Course  Procedures (including critical care time) Labs Review  MDM  54 y.o. female with chronic low back pain that is worse today than usual. Stable for d/c without focal neuro deficits. Will treat with muscle relaxant and she will follow up with her PCP. Albuterol inhaler refilled.  Final diagnoses:  Back pain, chronic  Simple chronic bronchitis       Ashley Murrain, NP 08/18/14 2145  Orlie Dakin, MD 08/18/14 2300

## 2014-08-18 NOTE — ED Notes (Signed)
Pt states she is having pain "all over" and that she hurts the most in her back.She is also complaining of SOB d/t her "COPD and Emphysema".

## 2014-08-18 NOTE — Discharge Instructions (Signed)
Do not use alcohol or street drugs while taking the medication for your back.

## 2014-10-14 ENCOUNTER — Other Ambulatory Visit: Payer: Medicaid Other | Admitting: Adult Health

## 2014-10-22 ENCOUNTER — Encounter: Payer: Self-pay | Admitting: *Deleted

## 2014-10-22 ENCOUNTER — Other Ambulatory Visit: Payer: Medicaid Other | Admitting: Adult Health

## 2015-03-01 ENCOUNTER — Other Ambulatory Visit (HOSPITAL_COMMUNITY): Payer: Self-pay | Admitting: Internal Medicine

## 2015-03-01 DIAGNOSIS — Z1231 Encounter for screening mammogram for malignant neoplasm of breast: Secondary | ICD-10-CM

## 2015-03-10 ENCOUNTER — Ambulatory Visit (HOSPITAL_COMMUNITY): Payer: Medicaid Other

## 2015-03-10 ENCOUNTER — Other Ambulatory Visit (HOSPITAL_COMMUNITY): Payer: Self-pay | Admitting: Internal Medicine

## 2015-03-10 ENCOUNTER — Ambulatory Visit (HOSPITAL_COMMUNITY)
Admission: RE | Admit: 2015-03-10 | Discharge: 2015-03-10 | Disposition: A | Discharge status: Home / Self Care | Payer: Medicaid Other | Source: Ambulatory Visit | Attending: Internal Medicine | Admitting: Internal Medicine

## 2015-03-10 DIAGNOSIS — Z1231 Encounter for screening mammogram for malignant neoplasm of breast: Secondary | ICD-10-CM | POA: Diagnosis present

## 2015-04-22 ENCOUNTER — Emergency Department (HOSPITAL_COMMUNITY): Payer: Medicaid Other

## 2015-04-22 ENCOUNTER — Emergency Department (HOSPITAL_COMMUNITY)
Admission: EM | Admit: 2015-04-22 | Discharge: 2015-04-22 | Disposition: A | Discharge status: Home / Self Care | Payer: Medicaid Other | Attending: Emergency Medicine | Admitting: Emergency Medicine

## 2015-04-22 ENCOUNTER — Encounter (HOSPITAL_COMMUNITY): Payer: Self-pay | Admitting: Emergency Medicine

## 2015-04-22 DIAGNOSIS — K219 Gastro-esophageal reflux disease without esophagitis: Secondary | ICD-10-CM | POA: Diagnosis not present

## 2015-04-22 DIAGNOSIS — Z8541 Personal history of malignant neoplasm of cervix uteri: Secondary | ICD-10-CM | POA: Insufficient documentation

## 2015-04-22 DIAGNOSIS — F17210 Nicotine dependence, cigarettes, uncomplicated: Secondary | ICD-10-CM | POA: Diagnosis not present

## 2015-04-22 DIAGNOSIS — J441 Chronic obstructive pulmonary disease with (acute) exacerbation: Secondary | ICD-10-CM | POA: Diagnosis not present

## 2015-04-22 DIAGNOSIS — Z8701 Personal history of pneumonia (recurrent): Secondary | ICD-10-CM | POA: Insufficient documentation

## 2015-04-22 DIAGNOSIS — F419 Anxiety disorder, unspecified: Secondary | ICD-10-CM | POA: Diagnosis not present

## 2015-04-22 DIAGNOSIS — Z79899 Other long term (current) drug therapy: Secondary | ICD-10-CM | POA: Diagnosis not present

## 2015-04-22 DIAGNOSIS — F209 Schizophrenia, unspecified: Secondary | ICD-10-CM | POA: Insufficient documentation

## 2015-04-22 DIAGNOSIS — R0602 Shortness of breath: Secondary | ICD-10-CM | POA: Diagnosis present

## 2015-04-22 DIAGNOSIS — K000 Anodontia: Secondary | ICD-10-CM | POA: Diagnosis not present

## 2015-04-22 DIAGNOSIS — I10 Essential (primary) hypertension: Secondary | ICD-10-CM | POA: Diagnosis not present

## 2015-04-22 DIAGNOSIS — J40 Bronchitis, not specified as acute or chronic: Secondary | ICD-10-CM

## 2015-04-22 DIAGNOSIS — J189 Pneumonia, unspecified organism: Secondary | ICD-10-CM | POA: Insufficient documentation

## 2015-04-22 LAB — COMPREHENSIVE METABOLIC PANEL
ALBUMIN: 4.2 g/dL (ref 3.5–5.0)
ALT: 18 U/L (ref 14–54)
ANION GAP: 9 (ref 5–15)
AST: 25 U/L (ref 15–41)
Alkaline Phosphatase: 79 U/L (ref 38–126)
BUN: 10 mg/dL (ref 6–20)
CHLORIDE: 100 mmol/L — AB (ref 101–111)
CO2: 26 mmol/L (ref 22–32)
Calcium: 9.2 mg/dL (ref 8.9–10.3)
Creatinine, Ser: 0.82 mg/dL (ref 0.44–1.00)
GFR calc Af Amer: >60 mL/min (ref >60)
GFR calc non Af Amer: >60 mL/min (ref >60)
GLUCOSE: 114 mg/dL — AB (ref 65–99)
POTASSIUM: 3.8 mmol/L (ref 3.5–5.1)
SODIUM: 135 mmol/L (ref 135–145)
Total Bilirubin: 0.3 mg/dL (ref 0.3–1.2)
Total Protein: 7.6 g/dL (ref 6.5–8.1)

## 2015-04-22 LAB — CBC WITH DIFFERENTIAL/PLATELET
BASOS PCT: 2 %
Basophils Absolute: 0.1 10*3/uL (ref 0.0–0.1)
EOS ABS: 0 10*3/uL (ref 0.0–0.7)
Eosinophils Relative: 1 %
HCT: 44.7 % (ref 36.0–46.0)
Hemoglobin: 15.4 g/dL — ABNORMAL HIGH (ref 12.0–15.0)
LYMPHS PCT: 44 %
Lymphs Abs: 2.1 10*3/uL (ref 0.7–4.0)
MCH: 33.4 pg (ref 26.0–34.0)
MCHC: 34.5 g/dL (ref 30.0–36.0)
MCV: 97 fL (ref 78.0–100.0)
MONO ABS: 0.8 10*3/uL (ref 0.1–1.0)
Monocytes Relative: 17 %
NEUTROS ABS: 1.7 10*3/uL (ref 1.7–7.7)
Neutrophils Relative %: 36 %
PLATELETS: 357 10*3/uL (ref 150–400)
RBC: 4.61 MIL/uL (ref 3.87–5.11)
RDW: 13.7 % (ref 11.5–15.5)
WBC: 4.7 10*3/uL (ref 4.0–10.5)

## 2015-04-22 MED ORDER — HYDROCODONE-ACETAMINOPHEN 5-325 MG PO TABS
1.0000 | ORAL_TABLET | Freq: Once | ORAL | Status: AC
  Administered 2015-04-22: 1 via ORAL
  Filled 2015-04-22: qty 1

## 2015-04-22 MED ORDER — AZITHROMYCIN 250 MG PO TABS: 250.0000 mg | ORAL_TABLET | Freq: Every day | ORAL | Status: DC

## 2015-04-22 MED ORDER — PREDNISONE 10 MG PO TABS: 20.0000 mg | ORAL_TABLET | Freq: Every day | ORAL | Status: DC

## 2015-04-22 MED ORDER — PREDNISONE 10 MG PO TABS
60.0000 mg | ORAL_TABLET | Freq: Once | ORAL | Status: AC
  Administered 2015-04-22: 60 mg via ORAL
  Filled 2015-04-22: qty 1

## 2015-04-22 MED ORDER — HYDROCOD POLST-CPM POLST ER 10-8 MG/5ML PO SUER: 5.0000 mL | Freq: Two times a day (BID) | ORAL | Status: DC | PRN

## 2015-04-22 MED ORDER — AZITHROMYCIN 250 MG PO TABS
500.0000 mg | ORAL_TABLET | Freq: Once | ORAL | Status: AC
  Administered 2015-04-22: 500 mg via ORAL
  Filled 2015-04-22: qty 2

## 2015-04-22 NOTE — ED Provider Notes (Signed)
CSN: [id number]     Arrival date & time 04/22/15  1607 History   First MD Initiated Contact with Patient 04/22/15 1638     Chief Complaint  Patient presents with  . Shortness of Breath     (Consider location/radiation/quality/duration/timing/severity/associated sxs/prior Treatment) Patient is a 54 y.o. female presenting with shortness of breath. The history is provided by the patient (The patient complains of a cough some shortness of breath).  Shortness of Breath Severity:  Mild Onset quality:  Gradual Timing:  Constant Progression:  Waxing and waning Chronicity:  New Context: not activity   Associated symptoms: no abdominal pain, no chest pain, no cough, no headaches and no rash     Past Medical History  Diagnosis Date  . Hypertension   . Tobacco abuse   . Alcohol abuse   . Cocaine abuse   . Alcoholic hepatitis [id number]  . Auditory hallucinations   . Anxiety disorder   . Schizophrenia ([place])   . COPD (chronic obstructive pulmonary disease) ([place])   . Emphysema   . Chronic bronchitis with acute exacerbation ([place])   . History of cervical cancer 09/26/2013  . 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 GERD (gastroesophageal reflux disease)   . Pneumonia     hx  . Dysrhythmia 2012    hx AF in er-cocaine-converted back NSR  . Cancer [facility])     cervical   Past Surgical History  Procedure Laterality Date  . Abdominal hysterectomy    . Colonoscopy  05/07/2002    Dr.Demason- large approximately 1.5cm polyp pedunculated aproximately 10cm from the anal verge bx= adenomatous polyp  . Esophagogastroduodenoscopy  06/02/2002    Dr. Anthony Sar- mild distal esophagitis w/o stricturing or ulceration. stomach and pylorus are normal. inflammatory changes of proximal duldenum in the first portion.   . Fracture surgery Right     [place]  . Breast biopsy  2005    lt bx   Family History  Problem Relation Age of Onset  . Heart failure Other   . Alzheimer's disease Mother   . Other Father     "he was  beat to death"  . Heart disease Sister   . Gout Sister   . COPD Sister   . Seizures Sister   . Diabetes Sister   . Diabetes Brother   . Cancer Paternal Grandmother     not sure what kind  . Seizures Sister   . Other Sister     "bones are messed up"  . Hypertension Brother   . Heart attack Brother   . Other Brother     MVA  . Diabetes Brother   . Other Brother     MVA  . Alcohol abuse Daughter   . Other Son     motorcycle accident   Social History  Substance Use Topics  . Smoking status: Current Every Day Smoker -- 1.00 packs/day    Types: Cigarettes  . Smokeless tobacco: Former Systems developer    Types: Snuff     Comment: One pack a day  . Alcohol Use: 1.2 oz/week    2 Cans of beer per week     Comment: 2 cans a day   OB History    Gravida Para Term Preterm AB TAB SAB Ectopic Multiple Living   3 3 3       3      Review of Systems  Constitutional: Negative for appetite change and fatigue.  HENT: Negative for congestion, ear discharge and sinus pressure.  Eyes: Negative for discharge.  Respiratory: Positive for shortness of breath. Negative for cough.   Cardiovascular: Negative for chest pain.  Gastrointestinal: Negative for abdominal pain and diarrhea.  Genitourinary: Negative for frequency and hematuria.  Musculoskeletal: Negative for back pain.  Skin: Negative for rash.  Neurological: Negative for seizures and headaches.  Psychiatric/Behavioral: Negative for hallucinations.      Allergies  Ibuprofen  Home Medications   Prior to Admission medications   Medication Sig Start Date End Date Taking? Authorizing Provider  guaiFENesin (ROBITUSSIN) 100 MG/5ML SOLN Take 5 mLs by mouth every 4 (four) hours as needed for cough or to loosen phlegm.   Yes Historical Provider, MD  albuterol (PROVENTIL HFA;VENTOLIN HFA) 108 (90 BASE) MCG/ACT inhaler Inhale 1-2 puffs into the lungs every 6 (six) hours as needed for wheezing or shortness of breath. 08/18/14   Hope Bunnie Pion, NP   amLODipine (NORVASC) 5 MG tablet Take 5 mg by mouth daily.    Historical Provider, MD  azithromycin (ZITHROMAX) 250 MG tablet Take 1 tablet (250 mg total) by mouth daily. 04/22/15   Milton Ferguson, MD  chlorpheniramine-HYDROcodone [facility]) 10-8 MG/5ML SUER Take 5 mLs by mouth every 12 (twelve) hours as needed for cough. 04/22/15   Milton Ferguson, MD  citalopram (CELEXA) 10 MG tablet Take 10 mg by mouth daily.    Historical Provider, MD  HYDROcodone-acetaminophen (NORCO/VICODIN) 5-325 MG per tablet Take 1 tablet by mouth every 4 (four) hours as needed. Patient not taking: Reported on 04/22/2015 05/18/14   Kristen N Ward, DO  ibuprofen (ADVIL,MOTRIN) 800 MG tablet Take 1 tablet (800 mg total) by mouth every 8 (eight) hours as needed for mild pain. Patient not taking: Reported on 04/22/2015 05/18/14   Kristen N Ward, DO  LORazepam (ATIVAN) 1 MG tablet Take 1 mg by mouth every 8 (eight) hours.    Historical Provider, MD  OLANZapine (ZYPREXA) 20 MG tablet Take 20 mg by mouth daily. Pt said she takes it but not every day 03/16/11   Rexene Alberts, MD  pantoprazole (PROTONIX) 40 MG tablet Take 1 tablet (40 mg total) by mouth daily. Patient not taking: Reported on 04/22/2015 12/02/13   Mahala Menghini, PA-C  predniSONE (DELTASONE) 10 MG tablet Take 2 tablets (20 mg total) by mouth daily. 04/22/15   Milton Ferguson, MD   BP 140/80 mmHg  Pulse 63  Temp(Src) 98 F (36.7 C) (Oral)  Resp 14  Ht 5\' 2"  (1.575 m)  Wt 110 lb (49.896 kg)  BMI 20.11 kg/m2  SpO2 98% Physical Exam  Constitutional: She is oriented to person, place, and time. She appears well-developed.  HENT:  Head: Normocephalic.  Eyes: Conjunctivae and EOM are normal. No scleral icterus.  Neck: Neck supple. No thyromegaly present.  Cardiovascular: Normal rate and regular rhythm.  Exam reveals no gallop and no friction rub.   No murmur heard. Pulmonary/Chest: No stridor. She has wheezes. She has no rales. She exhibits no  tenderness.  Abdominal: She exhibits no distension. There is no tenderness. There is no rebound.  Musculoskeletal: Normal range of motion. She exhibits no edema.  Lymphadenopathy:    She has no cervical adenopathy.  Neurological: She is oriented to person, place, and time. She exhibits normal muscle tone. Coordination normal.  Skin: No rash noted. No erythema.  Psychiatric: She has a normal mood and affect. Her behavior is normal.    ED Course  Procedures (including critical care time) Labs Review Labs Reviewed  CBC WITH DIFFERENTIAL/PLATELET -  Abnormal; Notable for the following:    Hemoglobin 15.4 (*)    All other components within normal limits  COMPREHENSIVE METABOLIC PANEL - Abnormal; Notable for the following:    Chloride 100 (*)    Glucose, Bld 114 (*)    All other components within normal limits    Imaging Review Dg Chest 2 View  04/22/2015  CLINICAL DATA:  Shortness of breath, BILATERAL lower extremity pain, nonproductive cough and weakness for 1 month, hypertension, COPD, smoker, GERD EXAM: CHEST  2 VIEW COMPARISON:  08/02/2013 FINDINGS: Normal heart size, mediastinal contours, and pulmonary vascularity. Atherosclerotic calcification aortic arch. Emphysematous and bronchitic changes consistent with COPD. No acute infiltrate, pleural effusion or pneumothorax. Bones demineralized. IMPRESSION: COPD changes. No acute abnormalities. Electronically Signed   By: Lavonia Dana M.D.   On: 04/22/2015 17:05   I have personally reviewed and evaluated these images and lab results as part of my medical decision-making.   EKG Interpretation   Date/Time:  Thursday April 22 2015 16:33:51 EST Ventricular Rate:  103 PR Interval:  141 QRS Duration: 77 QT Interval:  342 QTC Calculation: 448 R Axis:   71 Text Interpretation:  Sinus tachycardia Biatrial enlargement Confirmed by  Tannen Vandezande  MD, Kirsta Probert [phone]) on 04/22/2015 7:21:49 PM      MDM   Final diagnoses:  Bronchitis     Bronchitis and bronchospasm. Patient will continue albuterol and is given Z-Pak and Tussionex she will follow-up PCP    Milton Ferguson, MD 04/22/15 [phone]

## 2015-04-22 NOTE — ED Notes (Signed)
Pt reports sob, back pain, and bilateral leg pain x30month.  Pt ambulatory.

## 2015-04-22 NOTE — Discharge Instructions (Signed)
Follow up with dr. fanta next week 

## 2015-06-09 ENCOUNTER — Emergency Department (HOSPITAL_COMMUNITY)
Admission: EM | Admit: 2015-06-09 | Discharge: 2015-06-09 | Discharge status: Against Medical Advice | Payer: Medicaid Other | Attending: Emergency Medicine | Admitting: Emergency Medicine

## 2015-06-09 ENCOUNTER — Encounter (HOSPITAL_COMMUNITY): Payer: Self-pay | Admitting: Emergency Medicine

## 2015-06-09 ENCOUNTER — Emergency Department (HOSPITAL_COMMUNITY): Payer: Medicaid Other

## 2015-06-09 DIAGNOSIS — F419 Anxiety disorder, unspecified: Secondary | ICD-10-CM | POA: Insufficient documentation

## 2015-06-09 DIAGNOSIS — Z9119 Patient's noncompliance with other medical treatment and regimen: Secondary | ICD-10-CM | POA: Diagnosis not present

## 2015-06-09 DIAGNOSIS — G8929 Other chronic pain: Secondary | ICD-10-CM | POA: Insufficient documentation

## 2015-06-09 DIAGNOSIS — F209 Schizophrenia, unspecified: Secondary | ICD-10-CM | POA: Insufficient documentation

## 2015-06-09 DIAGNOSIS — M419 Scoliosis, unspecified: Secondary | ICD-10-CM | POA: Insufficient documentation

## 2015-06-09 DIAGNOSIS — M545 Low back pain: Secondary | ICD-10-CM | POA: Diagnosis not present

## 2015-06-09 DIAGNOSIS — Z8541 Personal history of malignant neoplasm of cervix uteri: Secondary | ICD-10-CM | POA: Insufficient documentation

## 2015-06-09 DIAGNOSIS — J449 Chronic obstructive pulmonary disease, unspecified: Secondary | ICD-10-CM | POA: Diagnosis not present

## 2015-06-09 DIAGNOSIS — I10 Essential (primary) hypertension: Secondary | ICD-10-CM | POA: Insufficient documentation

## 2015-06-09 DIAGNOSIS — F17210 Nicotine dependence, cigarettes, uncomplicated: Secondary | ICD-10-CM | POA: Insufficient documentation

## 2015-06-09 DIAGNOSIS — Z79899 Other long term (current) drug therapy: Secondary | ICD-10-CM | POA: Diagnosis not present

## 2015-06-09 DIAGNOSIS — K219 Gastro-esophageal reflux disease without esophagitis: Secondary | ICD-10-CM | POA: Insufficient documentation

## 2015-06-09 DIAGNOSIS — Z8701 Personal history of pneumonia (recurrent): Secondary | ICD-10-CM | POA: Insufficient documentation

## 2015-06-09 DIAGNOSIS — Z792 Long term (current) use of antibiotics: Secondary | ICD-10-CM | POA: Diagnosis not present

## 2015-06-09 DIAGNOSIS — Z7952 Long term (current) use of systemic steroids: Secondary | ICD-10-CM | POA: Insufficient documentation

## 2015-06-09 DIAGNOSIS — Z9114 Patient's other noncompliance with medication regimen: Secondary | ICD-10-CM

## 2015-06-09 DIAGNOSIS — M549 Dorsalgia, unspecified: Secondary | ICD-10-CM

## 2015-06-09 HISTORY — DX: Scoliosis, unspecified: M41.9

## 2015-06-09 HISTORY — DX: Other chronic pain: G89.29

## 2015-06-09 HISTORY — DX: Dorsalgia, unspecified: M54.9

## 2015-06-09 LAB — CBG MONITORING, ED: Glucose-Capillary: 49 mg/dL — ABNORMAL LOW (ref 65–99)

## 2015-06-09 NOTE — ED Provider Notes (Signed)
CSN: [id number]     Arrival date & time 06/09/15  1453 History   First MD Initiated Contact with Patient 06/09/15 1517     Chief Complaint  Patient presents with  . Hypertension  . Leg Pain      HPI  Pt was seen at 1520. Per pt, c/o gradual onset and persistence of constant acute flair of her chronic low back "pain" "forever."  Denies any change in her usual chronic pain pattern.  Pain worsens with palpation of the area and body position changes. States her PMD is not in the office today, so she came to the ED "for some pain medicines." Denies incont/retention of bowel or bladder, no saddle anesthesia, no focal motor weakness, no tingling/numbness in extremities, no fevers, no injury, no abd pain. Pt's family states pt was at [facility] today and told her "BP was elevated." Pt states she has not taken her BP meds "in like 4 months." Denies CP/palpitations, no SOB/cough, no abd pain, no N/V/D, no focal motor weakness, no tingling/numbness in extremities.    Past Medical History  Diagnosis Date  . Hypertension   . Tobacco abuse   . Alcohol abuse   . Cocaine abuse   . Alcoholic hepatitis [id number]  . Auditory hallucinations   . Anxiety disorder   . Schizophrenia ([place])   . COPD (chronic obstructive pulmonary disease) ([place])   . Emphysema   . Chronic bronchitis with acute exacerbation ([place])   . History of cervical cancer 09/26/2013  . 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 GERD (gastroesophageal reflux disease)   . Pneumonia     hx  . Dysrhythmia 2012    hx AF in er-cocaine-converted back NSR  . Cancer ([facility])     cervical  . Scoliosis   . Chronic back pain    Past Surgical History  Procedure Laterality Date  . Abdominal hysterectomy    . Colonoscopy  05/07/2002    Dr.Demason- large approximately 1.5cm polyp pedunculated aproximately 10cm from the anal verge bx= adenomatous polyp  . Esophagogastroduodenoscopy  06/02/2002    Dr. Anthony Sar- mild distal esophagitis w/o stricturing or ulceration. stomach  and pylorus are normal. inflammatory changes of proximal duldenum in the first portion.   . Fracture surgery Right     [place]  . Breast biopsy  2005    lt bx   Family History  Problem Relation Age of Onset  . Heart failure Other   . Alzheimer's disease Mother   . Other Father     "he was beat to death"  . Heart disease Sister   . Gout Sister   . COPD Sister   . Seizures Sister   . Diabetes Sister   . Diabetes Brother   . Cancer Paternal Grandmother     not sure what kind  . Seizures Sister   . Other Sister     "bones are messed up"  . Hypertension Brother   . Heart attack Brother   . Other Brother     MVA  . Diabetes Brother   . Other Brother     MVA  . Alcohol abuse Daughter   . Other Son     motorcycle accident   Social History  Substance Use Topics  . Smoking status: Current Every Day Smoker -- 1.00 packs/day    Types: Cigarettes  . Smokeless tobacco: Former Systems developer    Types: Snuff     Comment: One pack a day  . Alcohol Use: 1.2 oz/week  2 Cans of beer per week     Comment: 2 cans a day   OB History    Gravida Para Term Preterm AB TAB SAB Ectopic Multiple Living   3 3 3       3      Review of Systems ROS: Statement: All systems negative except as marked or noted in the HPI; Constitutional: Negative for fever and chills. ; ; Eyes: Negative for eye pain, redness and discharge. ; ; ENMT: Negative for ear pain, hoarseness, nasal congestion, sinus pressure and sore throat. ; ; Cardiovascular: Negative for chest pain, palpitations, diaphoresis, dyspnea and peripheral edema. ; ; Respiratory: Negative for cough, wheezing and stridor. ; ; Gastrointestinal: Negative for nausea, vomiting, diarrhea, abdominal pain, blood in stool, hematemesis, jaundice and rectal bleeding. . ; ; Genitourinary: Negative for dysuria, flank pain and hematuria. ; ; Musculoskeletal: +chronic back pain. Negative for neck pain. Negative for swelling and trauma.; ; Skin: Negative for pruritus,  rash, abrasions, blisters, bruising and skin lesion.; ; Neuro: Negative for headache, lightheadedness and neck stiffness. Negative for weakness, altered level of consciousness , altered mental status, extremity weakness, paresthesias, involuntary movement, seizure and syncope.      Allergies  Ibuprofen  Home Medications   Prior to Admission medications   Medication Sig Start Date End Date Taking? Authorizing Provider  albuterol (PROVENTIL HFA;VENTOLIN HFA) 108 (90 BASE) MCG/ACT inhaler Inhale 1-2 puffs into the lungs every 6 (six) hours as needed for wheezing or shortness of breath. 08/18/14   Hope Bunnie Pion, NP  amLODipine (NORVASC) 5 MG tablet Take 5 mg by mouth daily.    Historical Provider, MD  azithromycin (ZITHROMAX) 250 MG tablet Take 1 tablet (250 mg total) by mouth daily. 04/22/15   Milton Ferguson, MD  chlorpheniramine-HYDROcodone [facility]) 10-8 MG/5ML SUER Take 5 mLs by mouth every 12 (twelve) hours as needed for cough. 04/22/15   Milton Ferguson, MD  citalopram (CELEXA) 10 MG tablet Take 10 mg by mouth daily.    Historical Provider, MD  guaiFENesin (ROBITUSSIN) 100 MG/5ML SOLN Take 5 mLs by mouth every 4 (four) hours as needed for cough or to loosen phlegm.    Historical Provider, MD  HYDROcodone-acetaminophen (NORCO/VICODIN) 5-325 MG per tablet Take 1 tablet by mouth every 4 (four) hours as needed. Patient not taking: Reported on 04/22/2015 05/18/14   Kristen N Ward, DO  ibuprofen (ADVIL,MOTRIN) 800 MG tablet Take 1 tablet (800 mg total) by mouth every 8 (eight) hours as needed for mild pain. Patient not taking: Reported on 04/22/2015 05/18/14   Kristen N Ward, DO  LORazepam (ATIVAN) 1 MG tablet Take 1 mg by mouth every 8 (eight) hours.    Historical Provider, MD  OLANZapine (ZYPREXA) 20 MG tablet Take 20 mg by mouth daily. Pt said she takes it but not every day 03/16/11   Rexene Alberts, MD  pantoprazole (PROTONIX) 40 MG tablet Take 1 tablet (40 mg total) by mouth  daily. Patient not taking: Reported on 04/22/2015 12/02/13   Mahala Menghini, PA-C  predniSONE (DELTASONE) 10 MG tablet Take 2 tablets (20 mg total) by mouth daily. 04/22/15   Milton Ferguson, MD   BP 174/94 mmHg  Pulse 80  Temp(Src) 98 F (36.7 C) (Oral)  Resp 16  Ht 5\' 2"  (1.575 m)  Wt 111 lb (50.349 kg)  BMI 20.30 kg/m2  SpO2 100% Physical Exam  1525: Physical examination:  Nursing notes reviewed; Vital signs and O2 SAT reviewed;  Constitutional: Well developed,  Well nourished, Well hydrated, In no acute distress; Head:  Normocephalic, atraumatic; Eyes: EOMI, PERRL, No scleral icterus; ENMT: Mouth and pharynx normal, Mucous membranes moist; Neck: Supple, Full range of motion, No lymphadenopathy; Cardiovascular: Regular rate and rhythm, No gallop; Respiratory: Breath sounds clear & equal bilaterally, No wheezes.  Speaking full sentences with ease, Normal respiratory effort/excursion; Chest: Nontender, Movement normal; Abdomen: Soft, Nontender, Nondistended, Normal bowel sounds; Genitourinary: No CVA tenderness; Spine:  No midline CS, TS, LS tenderness. +TTP bilat lumbar paraspinal muscles. No rash.;; Extremities: Pulses normal, No tenderness, No edema, No calf tenderness, edema or asymmetry.; Neuro: AA&Ox3, Major CN grossly intact.  Edentulous, speech at baseline. Grips equal. Strength 5/5 equal bilat UE's and LE's, including great toe dorsiflexion.  DTR 2/4 equal bilat UE's and LE's.  No gross sensory deficits.  Neg straight leg raises bilat.  Climbs on and off stretcher easily by herself. Gait steady.; Skin: Color normal, Warm, Dry.   ED Course  Procedures (including critical care time) Labs Review  Imaging Review  I have personally reviewed and evaluated these images and lab results as part of my medical decision-making.   EKG Interpretation None      MDM  MDM Reviewed: previous chart, nursing note and vitals Reviewed previous: labs     1535:  After my evaluation, pt stated she  was going out to smoke. She was noted by RN staff to walk out the front door.    Francine Graven, DO 06/13/15 2112

## 2015-06-09 NOTE — ED Notes (Signed)
Pt left room and stated she was going out to smoke. Pt informed she could not go out to smoke. Pt stated sh didn't want to see the doctor and she was leaving. EDP aware

## 2015-06-09 NOTE — ED Notes (Addendum)
Pt reports being sent over by Dr. Josephine Cables office for hypertension. BP 182/107. States Dr Legrand Rams is out of town for two weeks and was told to come here to get evaluated. Pt also reports bilateral lower leg pain. Pt hx of scoliosis.

## 2015-06-09 NOTE — ED Notes (Signed)
Pt reports was at [facility] today and bp was elevated.  Reports went to dr. Josephine Cables office for eval of bp and lower leg pain but he was not in so pt came to Er for eval.  Pt c/o pain in lower back and bilateral lower legs for "months."   Pt getting into gown, will asess legs further.

## 2015-09-17 LAB — BASIC METABOLIC PANEL: Glucose: 99 mg/dL

## 2015-10-07 ENCOUNTER — Ambulatory Visit (INDEPENDENT_AMBULATORY_CARE_PROVIDER_SITE_OTHER): Payer: Medicaid Other | Admitting: Otolaryngology

## 2015-10-07 DIAGNOSIS — R490 Dysphonia: Secondary | ICD-10-CM

## 2015-10-07 DIAGNOSIS — D3709 Neoplasm of uncertain behavior of other specified sites of the oral cavity: Secondary | ICD-10-CM

## 2015-10-11 ENCOUNTER — Other Ambulatory Visit: Payer: Self-pay | Admitting: Otolaryngology

## 2015-10-19 ENCOUNTER — Encounter (HOSPITAL_BASED_OUTPATIENT_CLINIC_OR_DEPARTMENT_OTHER): Payer: Self-pay | Admitting: *Deleted

## 2015-10-22 ENCOUNTER — Encounter (HOSPITAL_COMMUNITY)
Admission: RE | Admit: 2015-10-22 | Discharge: 2015-10-22 | Disposition: A | Discharge status: Home / Self Care | Payer: Medicaid Other | Source: Ambulatory Visit | Attending: Otolaryngology | Admitting: Otolaryngology

## 2015-10-22 DIAGNOSIS — Z01812 Encounter for preprocedural laboratory examination: Secondary | ICD-10-CM | POA: Insufficient documentation

## 2015-10-22 DIAGNOSIS — C109 Malignant neoplasm of oropharynx, unspecified: Secondary | ICD-10-CM | POA: Diagnosis not present

## 2015-10-22 LAB — COMPREHENSIVE METABOLIC PANEL
ALT: 12 U/L — ABNORMAL LOW (ref 14–54)
AST: 19 U/L (ref 15–41)
Albumin: 3.9 g/dL (ref 3.5–5.0)
Alkaline Phosphatase: 79 U/L (ref 38–126)
Anion gap: 8 (ref 5–15)
BUN: 14 mg/dL (ref 6–20)
CALCIUM: 9.1 mg/dL (ref 8.9–10.3)
CHLORIDE: 104 mmol/L (ref 101–111)
CO2: 25 mmol/L (ref 22–32)
Creatinine, Ser: 0.78 mg/dL (ref 0.44–1.00)
Glucose, Bld: 100 mg/dL — ABNORMAL HIGH (ref 65–99)
POTASSIUM: 3.8 mmol/L (ref 3.5–5.1)
Sodium: 137 mmol/L (ref 135–145)
Total Bilirubin: 0.6 mg/dL (ref 0.3–1.2)
Total Protein: 7.2 g/dL (ref 6.5–8.1)

## 2015-10-25 ENCOUNTER — Encounter (HOSPITAL_BASED_OUTPATIENT_CLINIC_OR_DEPARTMENT_OTHER): Payer: Self-pay | Admitting: Anesthesiology

## 2015-10-25 ENCOUNTER — Ambulatory Visit (HOSPITAL_BASED_OUTPATIENT_CLINIC_OR_DEPARTMENT_OTHER): Payer: Medicaid Other | Admitting: Anesthesiology

## 2015-10-25 ENCOUNTER — Ambulatory Visit (HOSPITAL_BASED_OUTPATIENT_CLINIC_OR_DEPARTMENT_OTHER)
Admission: RE | Admit: 2015-10-25 | Discharge: 2015-10-25 | Disposition: A | Discharge status: Home / Self Care | Payer: Medicaid Other | Source: Ambulatory Visit | Attending: Otolaryngology | Admitting: Otolaryngology

## 2015-10-25 ENCOUNTER — Encounter (HOSPITAL_BASED_OUTPATIENT_CLINIC_OR_DEPARTMENT_OTHER)
Admission: RE | Disposition: A | Discharge status: Home / Self Care | Payer: Self-pay | Source: Ambulatory Visit | Attending: Otolaryngology

## 2015-10-25 DIAGNOSIS — I10 Essential (primary) hypertension: Secondary | ICD-10-CM | POA: Insufficient documentation

## 2015-10-25 DIAGNOSIS — K219 Gastro-esophageal reflux disease without esophagitis: Secondary | ICD-10-CM | POA: Diagnosis not present

## 2015-10-25 DIAGNOSIS — J449 Chronic obstructive pulmonary disease, unspecified: Secondary | ICD-10-CM | POA: Diagnosis not present

## 2015-10-25 DIAGNOSIS — Z8541 Personal history of malignant neoplasm of cervix uteri: Secondary | ICD-10-CM | POA: Diagnosis not present

## 2015-10-25 DIAGNOSIS — J45909 Unspecified asthma, uncomplicated: Secondary | ICD-10-CM | POA: Diagnosis not present

## 2015-10-25 DIAGNOSIS — D3705 Neoplasm of uncertain behavior of pharynx: Secondary | ICD-10-CM | POA: Diagnosis not present

## 2015-10-25 DIAGNOSIS — C099 Malignant neoplasm of tonsil, unspecified: Secondary | ICD-10-CM | POA: Insufficient documentation

## 2015-10-25 DIAGNOSIS — F17200 Nicotine dependence, unspecified, uncomplicated: Secondary | ICD-10-CM | POA: Insufficient documentation

## 2015-10-25 DIAGNOSIS — R070 Pain in throat: Secondary | ICD-10-CM | POA: Diagnosis present

## 2015-10-25 HISTORY — PX: DIRECT LARYNGOSCOPY: SHX5326

## 2015-10-25 SURGERY — LARYNGOSCOPY, DIRECT
Anesthesia: General | Site: Throat

## 2015-10-25 MED ORDER — EPINEPHRINE HCL 1 MG/ML IJ SOLN
INTRAMUSCULAR | Status: AC
  Filled 2015-10-25: qty 1

## 2015-10-25 MED ORDER — SCOPOLAMINE 1 MG/3DAYS TD PT72: 1.0000 | MEDICATED_PATCH | Freq: Once | TRANSDERMAL | Status: DC | PRN

## 2015-10-25 MED ORDER — ONDANSETRON HCL 4 MG/2ML IJ SOLN: 4.0000 mg | Freq: Once | INTRAMUSCULAR | Status: DC | PRN

## 2015-10-25 MED ORDER — HYDROMORPHONE HCL 1 MG/ML IJ SOLN
0.2500 mg | INTRAMUSCULAR | Status: DC | PRN
  Administered 2015-10-25 (×2): 0.5 mg via INTRAVENOUS

## 2015-10-25 MED ORDER — MIDAZOLAM HCL 2 MG/2ML IJ SOLN
1.0000 mg | INTRAMUSCULAR | Status: DC | PRN
Start: 2015-10-25 — End: 2015-10-25
  Administered 2015-10-25: 2 mg via INTRAVENOUS

## 2015-10-25 MED ORDER — FENTANYL CITRATE (PF) 100 MCG/2ML IJ SOLN
50.0000 ug | INTRAMUSCULAR | Status: DC | PRN
  Administered 2015-10-25: 100 ug via INTRAVENOUS

## 2015-10-25 MED ORDER — SUCCINYLCHOLINE CHLORIDE 20 MG/ML IJ SOLN
INTRAMUSCULAR | Status: DC | PRN
  Administered 2015-10-25: 100 mg via INTRAVENOUS

## 2015-10-25 MED ORDER — LIDOCAINE 2% (20 MG/ML) 5 ML SYRINGE
INTRAMUSCULAR | Status: AC
  Filled 2015-10-25: qty 5

## 2015-10-25 MED ORDER — FENTANYL CITRATE (PF) 100 MCG/2ML IJ SOLN
INTRAMUSCULAR | Status: AC
  Filled 2015-10-25: qty 2

## 2015-10-25 MED ORDER — MEPERIDINE HCL 25 MG/ML IJ SOLN: 6.2500 mg | INTRAMUSCULAR | Status: DC | PRN

## 2015-10-25 MED ORDER — EPINEPHRINE HCL 1 MG/ML IJ SOLN
INTRAMUSCULAR | Status: DC | PRN
  Administered 2015-10-25: 1 mg

## 2015-10-25 MED ORDER — PROPOFOL 10 MG/ML IV BOLUS
INTRAVENOUS | Status: DC | PRN
  Administered 2015-10-25: 100 mg via INTRAVENOUS

## 2015-10-25 MED ORDER — HYDROMORPHONE HCL 1 MG/ML IJ SOLN
INTRAMUSCULAR | Status: AC
  Filled 2015-10-25: qty 1

## 2015-10-25 MED ORDER — LIDOCAINE HCL (CARDIAC) 20 MG/ML IV SOLN
INTRAVENOUS | Status: DC | PRN
  Administered 2015-10-25: 100 mg via INTRAVENOUS

## 2015-10-25 MED ORDER — ONDANSETRON HCL 4 MG/2ML IJ SOLN
INTRAMUSCULAR | Status: AC
  Filled 2015-10-25: qty 2

## 2015-10-25 MED ORDER — SUCCINYLCHOLINE CHLORIDE 200 MG/10ML IV SOSY
PREFILLED_SYRINGE | INTRAVENOUS | Status: AC
  Filled 2015-10-25: qty 10

## 2015-10-25 MED ORDER — OXYCODONE-ACETAMINOPHEN 5-325 MG PO TABS: 1.0000 | ORAL_TABLET | ORAL | Status: DC | PRN

## 2015-10-25 MED ORDER — ONDANSETRON HCL 4 MG/2ML IJ SOLN
INTRAMUSCULAR | Status: DC | PRN
  Administered 2015-10-25: 4 mg via INTRAVENOUS

## 2015-10-25 MED ORDER — LIDOCAINE-EPINEPHRINE 1 %-1:100000 IJ SOLN
INTRAMUSCULAR | Status: AC
  Filled 2015-10-25: qty 1

## 2015-10-25 MED ORDER — GLYCOPYRROLATE 0.2 MG/ML IJ SOLN: 0.2000 mg | Freq: Once | INTRAMUSCULAR | Status: DC | PRN

## 2015-10-25 MED ORDER — DEXAMETHASONE SODIUM PHOSPHATE 10 MG/ML IJ SOLN
INTRAMUSCULAR | Status: AC
  Filled 2015-10-25: qty 1

## 2015-10-25 MED ORDER — MIDAZOLAM HCL 2 MG/2ML IJ SOLN
INTRAMUSCULAR | Status: AC
  Filled 2015-10-25: qty 2

## 2015-10-25 MED ORDER — DEXAMETHASONE SODIUM PHOSPHATE 4 MG/ML IJ SOLN
INTRAMUSCULAR | Status: DC | PRN
  Administered 2015-10-25: 10 mg via INTRAVENOUS

## 2015-10-25 MED ORDER — LACTATED RINGERS IV SOLN
INTRAVENOUS | Status: DC
  Administered 2015-10-25: 11:00:00 via INTRAVENOUS

## 2015-10-25 SURGICAL SUPPLY — 29 items
ADAPTER TUBE FLEX ULTRASET (MISCELLANEOUS) ×1 IMPLANT
CANISTER SUCT 1200ML W/VALVE (MISCELLANEOUS) ×3 IMPLANT
COAGULATOR SUCT 8FR VV (MISCELLANEOUS) ×2 IMPLANT
ELECT REM PT RETURN 9FT ADLT (ELECTROSURGICAL) ×3
ELECTRODE REM PT RTRN 9FT ADLT (ELECTROSURGICAL) IMPLANT
GLOVE BIO SURGEON STRL SZ7.5 (GLOVE) ×3 IMPLANT
GLOVE SURG SS PI 7.0 STRL IVOR (GLOVE) ×2 IMPLANT
GOWN STRL REUS W/ TWL LRG LVL3 (GOWN DISPOSABLE) IMPLANT
GOWN STRL REUS W/TWL LRG LVL3 (GOWN DISPOSABLE) ×3
GUARD TEETH (MISCELLANEOUS) IMPLANT
MARKER SKIN DUAL TIP RULER LAB (MISCELLANEOUS) IMPLANT
NDL HYPO 18GX1.5 BLUNT FILL (NEEDLE) ×1 IMPLANT
NDL SPNL 22GX7 QUINCKE BK (NEEDLE) IMPLANT
NDL SPNL 25GX3.5 QUINCKE BL (NEEDLE) ×1 IMPLANT
NEEDLE HYPO 18GX1.5 BLUNT FILL (NEEDLE) ×3 IMPLANT
NEEDLE SPNL 22GX7 QUINCKE BK (NEEDLE) IMPLANT
NEEDLE SPNL 25GX3.5 QUINCKE BL (NEEDLE) IMPLANT
NS IRRIG 1000ML POUR BTL (IV SOLUTION) ×3 IMPLANT
PATTIES SURGICAL .5 X3 (DISPOSABLE) ×3 IMPLANT
SHEET MEDIUM DRAPE 40X70 STRL (DRAPES) ×3 IMPLANT
SLEEVE SCD COMPRESS KNEE MED (MISCELLANEOUS) IMPLANT
SOLUTION BUTLER CLEAR DIP (MISCELLANEOUS) ×1 IMPLANT
SPONGE GAUZE 4X4 12PLY STER LF (GAUZE/BANDAGES/DRESSINGS) ×4 IMPLANT
SURGILUBE 2OZ TUBE FLIPTOP (MISCELLANEOUS) IMPLANT
SYR CONTROL 10ML LL (SYRINGE) IMPLANT
SYR TB 1ML LL NO SAFETY (SYRINGE) ×3 IMPLANT
TOWEL OR 17X24 6PK STRL BLUE (TOWEL DISPOSABLE) ×3 IMPLANT
TUBE CONNECTING 20'X1/4 (TUBING) ×1
TUBE CONNECTING 20X1/4 (TUBING) ×2 IMPLANT

## 2015-10-25 NOTE — Discharge Instructions (Addendum)
The patient may resume all her previous activities and diet. She will follow-up in my [place] office in one week.   Post Anesthesia Home Care Instructions  Activity: Get plenty of rest for the remainder of the day. A responsible adult should stay with you for 24 hours following the procedure.  For the next 24 hours, DO NOT: -Drive a car -Paediatric nurse -Drink alcoholic beverages -Take any medication unless instructed by your physician -Make any legal decisions or sign important papers.  Meals: Start with liquid foods such as gelatin or soup. Progress to regular foods as tolerated. Avoid greasy, spicy, heavy foods. If nausea and/or vomiting occur, drink only clear liquids until the nausea and/or vomiting subsides. Call your physician if vomiting continues.  Special Instructions/Symptoms: Your throat may feel dry or sore from the anesthesia or the breathing tube placed in your throat during surgery. If this causes discomfort, gargle with warm salt water. The discomfort should disappear within 24 hours.  If you had a scopolamine patch placed behind your ear for the management of post- operative nausea and/or vomiting:  1. The medication in the patch is effective for 72 hours, after which it should be removed.  Wrap patch in a tissue and discard in the trash. Wash hands thoroughly with soap and water. 2. You may remove the patch earlier than 72 hours if you experience unpleasant side effects which may include dry mouth, dizziness or visual disturbances. 3. Avoid touching the patch. Wash your hands with soap and water after contact with the patch.

## 2015-10-25 NOTE — Anesthesia Preprocedure Evaluation (Signed)
Anesthesia Evaluation  Patient identified by MRN, date of birth, ID band Patient awake    Reviewed: Allergy & Precautions, NPO status , Patient's Chart, lab work & pertinent test results  Airway Mallampati: I  TM Distance: >3 FB Neck ROM: Full    Dental   Pulmonary COPD, Current Smoker,    Pulmonary exam normal        Cardiovascular hypertension, Pt. on medications Normal cardiovascular exam     Neuro/Psych    GI/Hepatic GERD  Medicated and Controlled,  Endo/Other    Renal/GU      Musculoskeletal   Abdominal   Peds  Hematology   Anesthesia Other Findings   Reproductive/Obstetrics                             Anesthesia Physical Anesthesia Plan  ASA: III  Anesthesia Plan: General   Post-op Pain Management:    Induction: Intravenous  Airway Management Planned: Oral ETT  Additional Equipment:   Intra-op Plan:   Post-operative Plan: Extubation in OR  Informed Consent: I have reviewed the patients History and Physical, chart, labs and discussed the procedure including the risks, benefits and alternatives for the proposed anesthesia with the patient or authorized representative who has indicated his/her understanding and acceptance.     Plan Discussed with: CRNA and Surgeon  Anesthesia Plan Comments:         Anesthesia Quick Evaluation

## 2015-10-25 NOTE — H&P (Signed)
Cc: Severe throat pain  HPI: The patient is a 55 y/o female who presents today for evaluation of a severe sore throat with possible abscess. The patient is seen in consultation requested by [facility]. The patient noted onset of a sore throat over 2 months ago. She was seen at the ER a few days ago. Neck CT showed bilateral palatine tonsil enlargement with asymmetric mucosal hyperenhancement along the right tonsil and soft palate. No discrete mass or abscess was noted. The patient was placed on Clindamycin but just started the medication yesterday. The patient is having significant difficulty eating and drinking. She is a 45+ year smoker. No previous ENT surgery is noted.   The patient's review of systems (constitutional, eyes, ENT, cardiovascular, respiratory, GI, musculoskeletal, skin, neurologic, psychiatric, endocrine, hematologic, allergic) is noted in the ROS questionnaire.  It is reviewed with the patient.   Family health history: None.   Major events: Hysterectomy, Breast surgery.   Ongoing medical problems: COPD, Asthma, hypertension, depression, hay fever, Cocaine abuse, Alcoholic hepatitis, auditory, Schizophrenia, Cervical cancer, GERD, Pneumonia.   Social history: The patient is widowed. She drinks a six pack of beer daily. She smokes one pack of cigarettes a day. She admits to use illegal drugs.  Exam General: Communicates with difficulty, well nourished, no acute distress. Head: Normocephalic, no evidence injury, no tenderness, facial buttresses intact without stepoff. Eyes: PERRL, EOMI.  No scleral icterus, conjunctivae clear. Ears: External auditory canals clear bilaterally.  There is no edema or erythema.  Tympanic membrane is within normal limits bilaterally. Nose: Normal skin and external support.  Anterior rhinoscopy reveals healthy pink mucosa over the septum and turbinates.  No lesions or polyps were seen. Oral cavity: Lips without lesions, oral mucosa moist. Cleft  palate. Severe ulceration and edema are noted especially along the right tonsillar pillar. Pharynx: Clear, no erythema. Neck: Supple, full range of motion, no lymphadenopathy, no masses palpable. Salivary: Parotid and submandibular glands without mass. Neuro:  CN 2-12 grossly intact. Gait normal. Vestibular: No nystagmus at any point of gaze.   Procedure:  Flexible Fiberoptic Laryngoscopy -- Risks, benefits, and alternatives of flexible endoscopy were explained to the patient.  Specific mention was made of the risk of throat numbness with difficulty swallowing, possible bleeding from the nose and mouth, and pain from the procedure.  The patient gave oral consent to proceed.  The nasal cavities were decongested and anesthetised with a combination of oxymetazoline and 4% lidocaine solution.  The flexible scope was inserted into the right nasal cavity and advanced towards the nasopharynx.  Visualized mucosa over the turbinates and septum were as described above.  The nasopharynx was clear.  Oropharyngeal walls were inflamed.  Hypopharynx was also without  lesion or edema.  Larynx was mobile without lesions. Supraglottic structures were free of edema, mass, and asymmetry.  True vocal folds were white without mass or lesion.  Base of tongue was within normal limits.  The patient tolerated the procedure well.   Assessment 1.  Severe bilateral tonsillar erythema and edema are noted with ulceration of the right tonsillar pillar. No discrete abscess was noted on neck CT.  2.  No other suspicious mass or lesion is noted on today's fiberoptic laryngoscopy exam. 3.  The ulcerative area is concerning for malignancy secondary to the patient's history of long term tobacco use.   Plan 1.  The physical exam, laryngoscopy, and CT findings are reviewed with the patient. All questions and concerns are addressed. 2.  Recommend direct  laryngoscopy with biopsy. The risks, benefits, alternatives, and details of the procedure are  reviewed with the patient. Questions are invited and answered.

## 2015-10-25 NOTE — Anesthesia Postprocedure Evaluation (Signed)
Anesthesia Post Note  Patient: Molly Campbell  Procedure(s) Performed: Procedure(s) (LRB): DIRECT LARYNGOSCOPY WITH BIOPSY OF ORAL PHARYNGEAL LESION (N/A)  Patient location during evaluation: PACU Anesthesia Type: General Level of consciousness: awake and alert Pain management: pain level controlled Vital Signs Assessment: post-procedure vital signs reviewed and stable Respiratory status: spontaneous breathing, nonlabored ventilation, respiratory function stable and patient connected to nasal cannula oxygen Cardiovascular status: blood pressure returned to baseline and stable Postop Assessment: no signs of nausea or vomiting Anesthetic complications: no    Last Vitals:  Filed Vitals:   10/25/15 1245 10/25/15 1328  BP: 129/92 143/97  Pulse: 79 74  Temp:  36.6 C  Resp: 19 18    Last Pain:  Filed Vitals:   10/25/15 1329  PainSc: 1                  Syona Wroblewski DAVID

## 2015-10-25 NOTE — Op Note (Signed)
DATE OF PROCEDURE:  10/25/2015                              OPERATIVE REPORT  SURGEON:  Leta Baptist, MD  PREOPERATIVE DIAGNOSES: 1. Severe throat pain. 2. Right oropharyngeal mass  POSTOPERATIVE DIAGNOSES: 1. Severe throat pain. 2. Right oropharyngeal mass  PROCEDURE PERFORMED:  Direct laryngoscopy and biopsy  ANESTHESIA:  General endotracheal tube anesthesia.  COMPLICATIONS:  None.  ESTIMATED BLOOD LOSS:  Minimal.  INDICATION FOR PROCEDURE:  [name] is a 55 y.o. female with a two-month history of severe sore throat. She was seen at the [facility] [place] emergency room. Her neck CT scan showed asymmetric mucosal hyperenhancement along the right tonsil and soft palate. No discrete mass or abscess was noted. On examination, the patient was noted to have ulceration of the right tonsillar pillar, concerning for malignancy. Based on the above findings, the decision was made for the patient to undergo the above stated procedure.  The risks, benefits, alternatives, and details of the procedure were discussed with the patient.  Questions were invited and answered.  Informed consent was obtained.  DESCRIPTION:  The patient was taken to the operating room and placed supine on the operating table.  General endotracheal tube anesthesia was administered by the anesthesiologist.  The patient was positioned and prepped and draped in a standard fashion for direct laryngoscopy. A Dedo laryngoscope was inserted via the oral cavity into the pharynx. Examination of the pharynx revealed a firm mass within the superior portion of the right tonsil. Ulceration of the anterior tonsillar pillar and adjacent soft palate was also noted. The epiglottis, aryepiglottic folds, vallecula, piriform sinuses, and vocal cords were all normal. 7 biopsy specimens were obtained from the right tonsillar fossa and the the adjacent mucosa. The specimens were sent to the pathology department for permanent histologic  identification.  The care of the patient was turned over to the anesthesiologist.  The patient was awakened from anesthesia without difficulty.  She was extubated and transferred to the recovery room in good condition.  OPERATIVE FINDINGS:  Right tonsillar/oropharyngeal mass.  SPECIMEN:  Right tonsillar mass biopsy specimens.  FOLLOWUP CARE:  The patient will be discharged home once awake and alert.  The patient will follow up in my office in approximately 1 week.  [name] 10/25/2015 11:56 AM

## 2015-10-25 NOTE — Transfer of Care (Signed)
Immediate Anesthesia Transfer of Care Note  Patient: Molly Campbell  Procedure(s) Performed: Procedure(s) with comments: DIRECT LARYNGOSCOPY WITH BIOPSY OF ORAL PHARYNGEAL LESION (N/A) - DIRECT LARYNGOSCOPY WITH BIOPSY OF ORAL PHARYNGEAL LESION  Patient Location: PACU  Anesthesia Type:General  Level of Consciousness: awake and alert   Airway & Oxygen Therapy: Patient Spontanous Breathing and Patient connected to face mask oxygen  Post-op Assessment: Report given to RN and Post -op Vital signs reviewed and stable  Post vital signs: Reviewed and stable  Last Vitals:  Filed Vitals:   10/25/15 1040  BP: 129/86  Pulse: 79  Temp: 36.8 C  Resp: 18    Last Pain:  Filed Vitals:   10/25/15 1042  PainSc: 6          Complications: No apparent anesthesia complications

## 2015-10-25 NOTE — Anesthesia Procedure Notes (Signed)
Procedure Name: Intubation Date/Time: 10/25/2015 11:27 AM Performed by: Lieutenant Diego Pre-anesthesia Checklist: Patient identified, Emergency Drugs available, Suction available and Patient being monitored Patient Re-evaluated:Patient Re-evaluated prior to inductionOxygen Delivery Method: Circle system utilized Preoxygenation: Pre-oxygenation with 100% oxygen Intubation Type: IV induction Ventilation: Mask ventilation without difficulty Laryngoscope Size: Miller and 2 Grade View: Grade I Tube type: Oral Tube size: 6.5 mm Number of attempts: 1 Airway Equipment and Method: Stylet and Oral airway Placement Confirmation: ETT inserted through vocal cords under direct vision,  positive ETCO2 and breath sounds checked- equal and bilateral Secured at: 22 cm Tube secured with: Tape Dental Injury: Teeth and Oropharynx as per pre-operative assessment

## 2015-10-26 ENCOUNTER — Encounter (HOSPITAL_BASED_OUTPATIENT_CLINIC_OR_DEPARTMENT_OTHER): Payer: Self-pay | Admitting: Otolaryngology

## 2015-11-01 ENCOUNTER — Ambulatory Visit (INDEPENDENT_AMBULATORY_CARE_PROVIDER_SITE_OTHER): Payer: Medicaid Other | Admitting: Otolaryngology

## 2015-11-01 DIAGNOSIS — C140 Malignant neoplasm of pharynx, unspecified: Secondary | ICD-10-CM

## 2015-11-04 ENCOUNTER — Encounter (HOSPITAL_COMMUNITY): Payer: Medicaid Other | Attending: Hematology & Oncology | Admitting: Hematology & Oncology

## 2015-11-04 ENCOUNTER — Encounter (HOSPITAL_COMMUNITY): Payer: Self-pay | Admitting: Hematology & Oncology

## 2015-11-04 VITALS — BP 163/91 | HR 90 | Temp 98.4°F | Resp 20 | Ht 62.0 in | Wt 111.8 lb

## 2015-11-04 DIAGNOSIS — Z789 Other specified health status: Secondary | ICD-10-CM

## 2015-11-04 DIAGNOSIS — C099 Malignant neoplasm of tonsil, unspecified: Secondary | ICD-10-CM

## 2015-11-04 DIAGNOSIS — Z720 Tobacco use: Secondary | ICD-10-CM

## 2015-11-04 DIAGNOSIS — Z7289 Other problems related to lifestyle: Secondary | ICD-10-CM

## 2015-11-04 NOTE — Progress Notes (Signed)
[facility]  CONSULT NOTE  Patient Care Team: Rosita Fire, MD as PCP - General (Internal Medicine) Danie Binder, MD as Consulting Physician (Gastroenterology) Milly Jakob, MD as Consulting Physician (Orthopedic Surgery)  CHIEF COMPLAINTS/PURPOSE OF CONSULTATION:  Right tonsillar invasive squamous cell carcinoma     Malignant neoplasm of tonsillar fossa ([place])   10/25/2015 Procedure Direct laryngoscopy and biopsyof right tonsillar mass.   10/27/2015 Pathology Results invasive squamous cell carcinoma, p16 NEGATIVE         HISTORY OF PRESENTING ILLNESS: Molly [facility] 55 y.o. female is here because of squamous cell carcinoma of right tonsil.  She is here today, accompanied by her daughter.   In general, she notes that she's been having constant sore throat. She ended up with Dr. Benjamine Mola after she presented to the emergency room with trouble breathing. She says it had been hurting for about two months before she went to see the doctor.Subsequently, the patient saw Dr. Benjamine Mola who performed a direct laryngoscopy and biopsy, which revealed cancer of the right tonsil.   Biopsy of right tonsil on 10/25/15 revealed: Invasive squamous cell carcinoma, p16 negative  When discussing the fact that her throat cancer is HPV negative, the patient mentions that she had cervical cancer 32 years ago, with a partial hysterectomy.  She wonders if she will lose her voice with treatment, and was educated that she should not lose her voice secondary to treatment.  She is up to date with colonoscopy, and Dr. Oneida Alar is her GI physician.  She is up to date with her mammograms.  Dr. Legrand Rams is her primary care provider.  When asked if she gets depressed, she confirms that yes, she does; but when advised that she can't be shy about having a hard time.  She is educated that she must communicate with [place] so we can help her through this difficult time in her life.  We discussed participation in support  groups and at this time, she is not interested. The patient's daughter however notes that she is nervous and scared of what's going on. Her daughter lives in [place].  During the physical exam, when asked if she's lost weight, she says no; she always stays around 111 lbs. When her abdomen is palpated, she indicates that her belly hurts by making some noises of reaction.  She comments that her legs are numb from the knees down.  The patient reports, "I've got poor blood circulation." She notes that the leg numbness doesn't happen all the time.  She says that her back hurts, and that lately, her whole left shoulder has felt like it's needed a sling to hold it up.  When asked if she sleeps at night, she says she's usually drinking, about 2 40 oz bottles a day. She says that one 40 oz bottle lasts her about five hours.  She lost her son 17 years ago, around the same time her mom got sick. She says after that happened, she "had to go into that crazy house for a while, with depression."Admits to illicit drug use. Admits to history of snuff use.     MEDICAL HISTORY:  Past Medical History  Diagnosis Date  . Hypertension   . Tobacco abuse   . Alcohol abuse   . Cocaine abuse   . Alcoholic hepatitis 62/10/9483  . Auditory hallucinations   . Anxiety disorder   . Schizophrenia ([place])   . COPD (chronic obstructive pulmonary disease) ([place])   . Emphysema   .  Chronic bronchitis with acute exacerbation ([place])   . History of cervical cancer 09/26/2013  . 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 GERD (gastroesophageal reflux disease)   . Pneumonia     hx  . Dysrhythmia 2012    hx AF in er-cocaine-converted back NSR  . Cancer (HCC)     cervical  . Scoliosis   . Chronic back pain     SURGICAL HISTORY: Past Surgical History  Procedure Laterality Date  . Abdominal hysterectomy    . Colonoscopy  05/07/2002    Dr.Demason- large approximately 1.5cm polyp pedunculated aproximately 10cm from the anal verge bx= adenomatous  polyp  . Esophagogastroduodenoscopy  06/02/2002    Dr. Anthony Sar- mild distal esophagitis w/o stricturing or ulceration. stomach and pylorus are normal. inflammatory changes of proximal duldenum in the first portion.   . Fracture surgery Right     Sp[facility]geon  . Breast biopsy  2005    lt bx  . Direct laryngoscopy N/A 10/25/2015    Procedure: DIRECT LARYNGOSCOPY WITH BIOPSY OF ORAL PHARYNGEAL LESION;  Surgeon: Leta Baptist, MD;  Location: [facility];  Service: ENT;  Laterality: N/A;  DIRECT LARYNGOSCOPY WITH BIOPSY OF ORAL PHARYNGEAL LESION    SOCIAL HISTORY: Social History   Social History  . Marital Status: Widowed    Spouse Name: N/A  . Number of Children: 3  . Years of Education: N/A   Occupational History  . Not on file.   Social History Main Topics  . Smoking status: Current Every Day Smoker -- 1.50 packs/day for 48 years    Types: Cigarettes  . Smokeless tobacco: Former Systems developer    Types: Snuff     Comment: One pack a day  . Alcohol Use: 1.2 oz/week    2 Cans of beer per week     Comment: 2 cans a day  . Drug Use: Yes    Special: Cocaine, Marijuana     Comment: Last cocaine used June 10th  . Sexual Activity: Not Currently    Birth Control/ Protection: Surgical   Other Topics Concern  . Not on file   Social History Narrative   Born here in [place]. Daughter, 2 daughters. Lost her son. 10 grandchildren. No great-grandchildren yet. Used to work pulling tobacco. Is a smoker. Close to 2 ppd. Started smoking at the age of 63. She notes "I don't drink water." 2 40 oz bottles of beer a day. (?)  She says she does regular things for hobbies. Her daughter notes that she likes to go fishing.  FAMILY HISTORY: Family History  Problem Relation Age of Onset  . Heart failure Other   . Alzheimer's disease Mother   . Other Father     "he was beat to death"  . Heart disease Sister   . Gout Sister   . COPD Sister   . Seizures Sister   . Diabetes Sister     . Diabetes Brother   . Cancer Paternal Grandmother     not sure what kind  . Seizures Sister   . Other Sister     "bones are messed up"  . Hypertension Brother   . Heart attack Brother   . Other Brother     MVA  . Diabetes Brother   . Other Brother     MVA  . Alcohol abuse Daughter   . Other Son     motorcycle accident   Mother & father deceased. Mother was 72 when she died. Had  Alzheimer's. Father was younger. 4 brothers & 2 sisters living (and she's one of the sisters). There were 13 siblings total.  ALLERGIES:  is allergic to benicar and ibuprofen.  MEDICATIONS:  Current Outpatient Prescriptions  Medication Sig Dispense Refill  . albuterol (PROVENTIL HFA;VENTOLIN HFA) 108 (90 BASE) MCG/ACT inhaler Inhale 1-2 puffs into the lungs every 6 (six) hours as needed for wheezing or shortness of breath. 1 Inhaler 0  . amLODipine (NORVASC) 5 MG tablet Take 5 mg by mouth daily.    Marland Kitchen oxyCODONE-acetaminophen (ROXICET) 5-325 MG tablet Take 1 tablet by mouth every 4 (four) hours as needed. 20 tablet 0   No current facility-administered medications for this visit.    Review of Systems  Constitutional: Negative.   HENT: Positive for sore throat.   Eyes: Negative.   Respiratory: Negative.   Cardiovascular: Negative.   Gastrointestinal: Positive for abdominal pain (during palpation on physical exam).  Genitourinary: Negative.   Musculoskeletal: Positive for back pain.  Skin: Negative.   Neurological: Negative.   Endo/Heme/Allergies: Negative.   Psychiatric/Behavioral: Positive for depression and substance abuse.       Indicates that she drinks 2 40 oz cans of beer a day.  All other systems reviewed and are negative.  14 point ROS was done and is otherwise as detailed above or in HPI  PHYSICAL EXAMINATION: ECOG PERFORMANCE STATUS: 1 - Symptomatic but completely ambulatory  Filed Vitals:   11/04/15 1319 11/04/15 1324  BP: 159/111 163/91  Pulse: 90   Temp: 98.4 F (36.9 C)    Resp: 20    Filed Weights   11/04/15 1319  Weight: 111 lb 12.8 oz (50.712 kg)     Physical Exam  Constitutional: She is oriented to person, place, and time and well-developed, well-nourished, and in no distress.  HENT:  Head: Normocephalic and atraumatic.  Nose: Nose normal.  Mouth/Throat: Oropharynx is clear and moist. No oropharyngeal exudate.  Glossal palatine arch is gone; ulcerative right tonsillar mass that extends throughout the right palate and the uvula and crosses midline    Eyes: Conjunctivae and EOM are normal. Pupils are equal, round, and reactive to light. Right eye exhibits no discharge. Left eye exhibits no discharge. No scleral icterus.  Neck: Normal range of motion. Neck supple. No tracheal deviation present. No thyromegaly present.  Cardiovascular: Normal rate, regular rhythm and normal heart sounds.  Exam reveals no gallop and no friction rub.   No murmur heard. Pulmonary/Chest: Effort normal and breath sounds normal. She has no wheezes. She has no rales.  Abdominal: Soft. Bowel sounds are normal. She exhibits no distension and no mass. There is no tenderness. There is no rebound and no guarding.  Musculoskeletal: Normal range of motion. She exhibits no edema.  Lymphadenopathy:    She has cervical adenopathy.  Neurological: She is alert and oriented to person, place, and time. She has normal reflexes. No cranial nerve deficit. Gait normal. Coordination normal.  Skin: Skin is warm and dry. No rash noted.  Psychiatric: Mood, memory, affect and judgment normal.  Nursing note and vitals reviewed.   LABORATORY DATA:  I have reviewed the data as listed Lab Results  Component Value Date   WBC 4.7 04/22/2015   HGB 15.4* 04/22/2015   HCT 44.7 04/22/2015   MCV 97.0 04/22/2015   PLT 357 04/22/2015   CMP     Component Value Date/Time   NA 137 10/22/2015 1039   K 3.8 10/22/2015 1039   CL 104 10/22/2015 1039  CO2 25 10/22/2015 1039   GLUCOSE 100* 10/22/2015  1039   BUN 14 10/22/2015 1039   CREATININE 0.78 10/22/2015 1039   CALCIUM 9.1 10/22/2015 1039   PROT 7.2 10/22/2015 1039   ALBUMIN 3.9 10/22/2015 1039   AST 19 10/22/2015 1039   ALT 12* 10/22/2015 1039   ALKPHOS 79 10/22/2015 1039   BILITOT 0.6 10/22/2015 1039   GFRNONAA >60 10/22/2015 1039   GFRAA >60 10/22/2015 1039     RADIOGRAPHIC STUDIES: I have personally reviewed the radiological images as listed and agreed with the findings in the report. No results found.  ASSESSMENT & PLAN:  Squamous cell carcinoma of right tonsil.  S/P biopsy by Dr. Benjamine Mola on 10/25/2015. Cervical cancer in 1985, treated surgically with a partial hysterectomy. EtOH abuse, still drinking, two 40 oz beer/day Tobacco abuse, still smoking 2 ppd H/O cocaine abuse  Alcoholic hepatitis Mental retardation GERD  Dr. Benjamine Mola performed a direct laryngoscopy with biopsy on 10/25/2015 demonstrating an invasive squamous cell carcinoma of right tonsillar mass; HPV NEGATIVE. CT imaging was performed at [facility]. Have called to obtain a copy of this. PET/CT has been ordered. Patient has been referred to social work and dental for evaluation and management.   She is advised that smoking cessation will be one of our priorities moving forward. She has multiple social issues that will make therapy difficult but appears to have a very supportive daughter.  Moving forward she will need: Nutrition consultation Feeding tube placement Port placement Radiation oncology placement Speech language pathology referral Baseline labs including TSH  The patient's daughter says she can get to [facility] if needed for radiation.  I'll see her back after her PET scan and after she's met with Dr. Isidore Moos.  She will go home today with reading materials regarding head and neck cancer. Will follow up with [place] post PET.  Orders Placed This Encounter  Procedures  . NM PET Image Initial (PI) Skull Base To Thigh    Standing Status: Future      Number of Occurrences: 1     Standing Expiration Date: 11/03/2016    Order Specific Question:  Reason for Exam (SYMPTOM  OR DIAGNOSIS REQUIRED)    Answer:  tonsillar carcinoma    Order Specific Question:  Is the patient pregnant?    Answer:  No    Order Specific Question:  Preferred imaging location?    Answer:  [facility]    Order Specific Question:  If indicated for the ordered procedure, I authorize the administration of a radiopharmaceutical per Radiology protocol    Answer:  Yes    All questions were answered. The patient knows to call the clinic with any problems, questions or concerns.  This document serves as a record of services personally performed by Ancil Linsey, MD. It was created on her behalf by Toni Amend, a trained medical scribe. The creation of this record is based on the scribe's personal observations and the provider's statements to them. This document has been checked and approved by the attending provider.  I have reviewed the above documentation for accuracy and completeness and I agree with the above.  This note was electronically signed.    Molli Hazard, MD  11/04/2015 2:20 PM

## 2015-11-04 NOTE — Patient Instructions (Addendum)
[place] at [facility] Discharge Instructions  RECOMMENDATIONS MADE BY THE CONSULTANT AND ANY TEST RESULTS WILL BE SENT TO YOUR REFERRING PHYSICIAN.  We are going to get you to [facility] for a PET scan. This will view your body and look inside of it for cancer.   We are referring you to Dr. Isidore Moos @ [facility]. She is a radiation doctor.   We are referring you to our Speech Therapist here in [place]. This is located across from [employer].  We are referring you to Dr. Enrique Sack in [place]. He is a dentist that will evaluate your teeth and see if any need to be pulled before radiation.  Ovid Curd our dietitian will contact you regarding how much food/calories you need to eat a day.   Return to see Dr. Whitney Muse after you have done your PET scan and have been seen by Dr. Isidore Moos.  If you have any questions call [phone] and ask for Hildred Alamin  Thank you for choosing [place] at [facility] to provide your oncology and hematology care.  To afford each patient quality time with our provider, please arrive at least 15 minutes before your scheduled appointment time.   Beginning January 23rd 2017 lab work for the [facility] will be done in the  Main lab at [facility] on 1st floor. If you have a lab appointment with the [facility] please come in thru the  Main Entrance and check in at the main information desk  You need to re-schedule your appointment should you arrive 10 or more minutes late.  We strive to give you quality time with our providers, and arriving late affects you and other patients whose appointments are after yours.  Also, if you no show three or more times for appointments you may be dismissed from the clinic at the providers discretion.     Again, thank you for choosing [facility].  Our hope is that these requests will decrease the amount of time that you wait before being seen by our physicians.        _____________________________________________________________  Should you have questions after your visit to [facility], please contact our office at [phone] between the hours of 8:30 a.m. and 4:30 p.m.  Voicemails left after 4:30 p.m. will not be returned until the following business day.  For prescription refill requests, have your pharmacy contact our office.         Resources For Cancer Patients and their Caregivers ? American Cancer Society: Can assist with transportation, wigs, general needs, runs Look Good Feel Better.        754 498 5338 ? Cancer Care: Provides financial assistance, online support groups, medication/co-pay assistance.  1-800-813-HOPE 782-849-1301) ? [facility] Assists [place] cancer patients and their families through emotional , educational and financial support.  (581)813-1260 ? [place] DSS Where to apply for food stamps, Medicaid and utility assistance. 6625197474 ? [facility]: Transportation to medical appointments. 519-104-8656 ? Social Security Administration: May apply for disability if have a Stage IV cancer. 763-541-7577 604-636-9002 ? [employer], Disability and Transit Services: Assists with nutrition, care and transit needs. [place] Support Programs: @10RELATIVEDAYS @ > Cancer Support Group  2nd Tuesday of the month 1pm-2pm, Journey Room  > Creative Journey  3rd Tuesday of the month 1130am-1pm, Journey Room  > Look Good Feel Better  1st Wednesday of the month 10am-12 noon, Journey Room (  Call [place] to register [phone])  PET Scan A PET scan, also called positron emission tomography, is a test that creates pictures of the inside of the body. A PET scan requires a small dose of a harmless radioactive material to be injected into a vein. When this material combines with certain substances in the body, it produces tiny particles that can be  detected by a scanner and converted into pictures.  The pictures created during a PET scan can be used to study a disease. They are often used to study cancer and cancer therapy. The colors and brightness on the pictures show different levels of organ and tissue function. For example, cancer tissue appears brighter than normal tissue on a PET scan picture. LET [facility] CARE PROVIDER KNOW ABOUT:   Any allergies you have.  All medicines you are taking, including vitamins, herbs, eye drops, creams, and over-the-counter medicines.  Previous problems you or members of your family have had with the use of anesthetics.  Any blood disorders you have.  Previous surgeries you have had.  Medical conditions you have.  If you are afraid of cramped spaces (claustrophobic). If claustrophobia is a problem, it usually can be relieved with mild sedatives or antianxiety medicines.  If you have trouble staying still for long periods of time. BEFORE THE PROCEDURE   Do not eat or drink anything after midnight on the night before the procedure or as directed by your health care provider.  Take medicines only as directed by your health care provider.  If you have diabetes, ask your health care provider for diet guidelines to control sugar (glucose) levels on the day of the test. PROCEDURE   A small amount of radioactive material will be injected into a vein. The test will begin 30-60 minutes after the injection, when the material has traveled around your body.  You will lie on a cushioned table, and the table will be moved through the center of a machine that looks like a large donut. It will take about 30-60 minutes for the machine to produce pictures of your body. You will need to stay still during this time. AFTER THE PROCEDURE  You may resume your normal diet and activities.  Drink several 8 oz glasses of water following the test to flush the radioactive material out of your body.   This  information is not intended to replace advice given to you by your health care provider. Make sure you discuss any questions you have with your health care provider.   Document Released: 10/29/2002 Document Revised: 05/15/2014 Document Reviewed: 08/06/2013 Elsevier Interactive Patient Education [employer].

## 2015-11-05 ENCOUNTER — Telehealth (HOSPITAL_COMMUNITY): Payer: Self-pay

## 2015-11-05 ENCOUNTER — Telehealth: Payer: Self-pay | Admitting: *Deleted

## 2015-11-05 NOTE — Telephone Encounter (Signed)
  Oncology Nurse Navigator Documentation  Navigator Location: [facility] (11/05/15 1331) Navigator Encounter Type: Telephone (11/05/15 1331)               Barriers/Navigation Needs: Coordination of Care (11/05/15 1331)       I called [facility] to request a copy of Molly [facility] 10/05/15 CT Neck w/ Contrast, spoke with Anderson Malta.  I was told that a Release of Information was not on file and therefore a copy could not be sent.  I was guided to have patient come to the cancer center at [facility] to complete a Release of Information form.  I called patient, LVMM with a request for call back.  Molly Orem, RN, BSN, [place] at [place] [phone]                       Time Spent with Patient: 15 (11/05/15 1331)

## 2015-11-05 NOTE — Telephone Encounter (Signed)
11/05/15              Called and left msg. on home and mobile # for patient to Dental Medicine to schedule Dental Consult w/Dr. Enrique Sack.  LRI

## 2015-11-05 NOTE — Telephone Encounter (Signed)
  Oncology Nurse Navigator Documentation  Navigator Location: [facility] (11/05/15 1315) Navigator Encounter Type: Introductory phone call (11/05/15 1315)                     Placed introductory call to new referral patient.  LVMM with request for call back.  Gayleen Orem, RN, BSN, [place] at [place] [phone]                        Time Spent with Patient: 15 (11/05/15 1315)

## 2015-11-08 ENCOUNTER — Telehealth: Payer: Self-pay | Admitting: *Deleted

## 2015-11-08 NOTE — Telephone Encounter (Signed)
  Oncology Nurse Navigator Documentation  Navigator Location: [facility] (11/08/15 [id number]) Navigator Encounter Type: Introductory phone call (11/08/15 0836)             Treatment Phase: Pre-Tx/Tx Discussion (11/08/15 0836) Barriers/Navigation Needs: Coordination of Care (11/08/15 [id number])   Interventions: Coordination of Care (11/08/15 0836)       Called patient's daughter [name], introduced myself as her mother's navigator, noting I have been unable to reach her mother by listed phone numbers.  Daughter indicated they ar trying to get her a new land-line.  I explained the need for her mother to go by Dr. Donald Pore office to sign a Patient Information Release form and to request a copy of her 10/05/15 CT Neck to bring Wednesday when she is scheduled for a PET at [facility].  She voiced understanding.  I confirmed her understanding of her mother's 12:30 arrival to [facility] on Wednesday for PET scan, including NPO status 6 hrs prior; 1230 Nurse Evaluation and 1:00 Dr. Isidore Moos appointments this Friday.  I confirmed her understanding of [facility] and [place] location, explained registration and arrival procedures for Wed and Friday appointments.    Gayleen Orem, RN, BSN, [place] at [place] [phone]                   Time Spent with Patient: 15 (11/08/15 0836)

## 2015-11-08 NOTE — Progress Notes (Signed)
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  Nutrition Status Yes No Comments  Weight changes? '[]'  '[x]'    Swallowing concerns? '[x]'  '[]'  She reports problems swallowing related to her gums on her left side because of pain. She has not modified her diet.   PEG? '[]'  '[x]'     Referrals Yes No Comments  Social Work? '[]'  '[x]'    Dentistry? '[x]'  '[]'  She has an apt with Dr. Enrique Sack 7/12  Swallowing therapy? '[]'  '[x]'  She will see a speech therapist 7/19  Nutrition? '[]'  '[x]'    Med/Onc? '[x]'  '[]'  Dr. Whitney Muse, her next apt is 7/14   Safety Issues Yes No Comments  Prior radiation? '[]'  '[x]'    Pacemaker/ICD? '[]'  '[x]'    Possible current pregnancy? '[]'  '[x]'    Is the patient on methotrexate? '[]'  '[x]'     Tobacco/Marijuana/Snuff/ETOH use:  She drinks Two 40 ounces beers daily She smokes one pack of cigarettes a day.   She admits to use illegal drugs, cocaine and marijuana She has a history of smokeless tobacco use. Snuff  Past/Anticipated interventions by otolaryngology, if any:  10/25/15 Dr. Benjamine Mola PROCEDURE PERFORMED: Direct laryngoscopy and biopsy  Past/Anticipated interventions by medical oncology, if any:  Dr. Whitney Muse 11/04/15 Dr. 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 It doesn't appear that she has any lymph nodes involved in the neck. She was advised that we're going to pick on her a lot about smoking.  We will get her with a dental advisory appointment with [facility]. We will also have Dr. Isidore Moos meet her for a head & neck radiation consult. We will also have her meet with a speech therapist at some point, to help with swallowing and maintenance through treatment. During her appointment today, we  discussed how she will have a team of ENT support taking care of her. I'll see her back after her PET scan and after she's met with Dr. Isidore Moos.  She will see Dr. Whitney Muse again on 11/19/15     Current Complaints / other details:   PET  11/10/15 IMPRESSION: 1. Bilateral palatine tonsil enlargement and hypermetabolism, with asymmetric hypermetabolism in the right palatine tonsil, consistent with primary malignancy in this location. Malignant involvement of the contralateral (left) palatine tonsil cannot be excluded. 2. Bilateral mildly enlarged hypermetabolic level 2 neck lymph nodes, suspicious for nodal metastases. 3. Four subcentimeter pulmonary nodules scattered in both lungs, the largest of which demonstrates mild metabolism (max SUV 1.9), which is considered significant for a nodule of this small size, and raises concern for pulmonary metastases. 4. Solid lobulated hypermetabolic 4.9 x 2.5 cm gastric mass arising exophytically from the anterior proximal stomach, most suggestive of a gastric GIST. Tissue sampling is advised for this mass, probably best obtained via upper endoscopic approach. 5. Three-vessel coronary atherosclerosis. Aortic atherosclerosis. 6. Mild centrilobular and paraseptal emphysema with mild diffuse bronchial wall thickening, suggesting COPD.  BP 127/88 mmHg  Pulse 90  Temp(Src) 98.6 F (37 C)  Ht '5\' 2"'  (1.575 m)  Wt 110 lb 11.2 oz (50.213 kg)  BMI 20.24 kg/m2  SpO2 99%   Wt Readings from Last 3 Encounters:  11/12/15 110 lb 11.2 oz (50.213 kg)  11/04/15 111 lb 12.8 oz (50.712 kg)  10/25/15 111 lb (50.349  kg)

## 2015-11-10 ENCOUNTER — Telehealth (HOSPITAL_COMMUNITY): Payer: Self-pay

## 2015-11-10 ENCOUNTER — Ambulatory Visit (HOSPITAL_COMMUNITY)
Admission: RE | Admit: 2015-11-10 | Discharge: 2015-11-10 | Disposition: A | Discharge status: Home / Self Care | Payer: Medicaid Other | Source: Ambulatory Visit | Attending: Hematology & Oncology | Admitting: Hematology & Oncology

## 2015-11-10 ENCOUNTER — Encounter: Payer: Self-pay | Admitting: *Deleted

## 2015-11-10 DIAGNOSIS — R1906 Epigastric swelling, mass or lump: Secondary | ICD-10-CM | POA: Diagnosis not present

## 2015-11-10 DIAGNOSIS — C099 Malignant neoplasm of tonsil, unspecified: Secondary | ICD-10-CM | POA: Diagnosis not present

## 2015-11-10 DIAGNOSIS — I700 Atherosclerosis of aorta: Secondary | ICD-10-CM | POA: Insufficient documentation

## 2015-11-10 DIAGNOSIS — I2510 Atherosclerotic heart disease of native coronary artery without angina pectoris: Secondary | ICD-10-CM | POA: Diagnosis not present

## 2015-11-10 DIAGNOSIS — R918 Other nonspecific abnormal finding of lung field: Secondary | ICD-10-CM | POA: Insufficient documentation

## 2015-11-10 DIAGNOSIS — J439 Emphysema, unspecified: Secondary | ICD-10-CM | POA: Diagnosis not present

## 2015-11-10 LAB — GLUCOSE, CAPILLARY: Glucose-Capillary: 105 mg/dL — ABNORMAL HIGH (ref 65–99)

## 2015-11-10 MED ORDER — FLUDEOXYGLUCOSE F - 18 (FDG) INJECTION
5.7000 | Freq: Once | INTRAVENOUS | Status: AC | PRN
  Administered 2015-11-10: 5.7 via INTRAVENOUS

## 2015-11-10 NOTE — Telephone Encounter (Signed)
07/05/174           Called and spoke w/patient's Daughter [name]. Told her Dental Medicine has been trying to reach her mother to schedule Dental Consult w/Dr. Enrique Sack. Daughter will have patient call today to schedule.  LRI

## 2015-11-10 NOTE — Progress Notes (Signed)
  Oncology Nurse Navigator Documentation  Navigator Location: [facility] Onc (11/10/15 1145) Navigator Encounter Type: Other (11/10/15 1145)           Patient Visit Type: Other (11/10/15 1145)         Met Ms. Molly Campbell at [facility] where she had arrived for her PET scan.  She was accompanied by her daughter. I introduced myself as her Navigator, explained my role as a member of her Care Team. I explained I would be joining her during Friday's appointment with Dr. Isidore Moos, would provide additional information at that time.   I encouraged her to call me with questions prior to her appt. She voiced understanding of information provided.  Gayleen Orem, RN, BSN, [place] at [place] [phone]                         Time Spent with Patient: 15 (11/10/15 1145)

## 2015-11-11 ENCOUNTER — Inpatient Hospital Stay
Admission: RE | Admit: 2015-11-11 | Discharge: 2015-11-11 | Disposition: A | Discharge status: Home / Self Care | Payer: Self-pay | Source: Ambulatory Visit | Attending: Radiation Oncology | Admitting: Radiation Oncology

## 2015-11-11 ENCOUNTER — Other Ambulatory Visit: Payer: Self-pay | Admitting: Radiation Oncology

## 2015-11-11 DIAGNOSIS — C760 Malignant neoplasm of head, face and neck: Secondary | ICD-10-CM

## 2015-11-12 ENCOUNTER — Encounter: Payer: Self-pay | Admitting: Radiation Oncology

## 2015-11-12 ENCOUNTER — Ambulatory Visit
Admission: RE | Admit: 2015-11-12 | Discharge: 2015-11-12 | Disposition: A | Discharge status: Home / Self Care | Payer: Medicaid Other | Source: Ambulatory Visit | Attending: Radiation Oncology | Admitting: Radiation Oncology

## 2015-11-12 ENCOUNTER — Telehealth (HOSPITAL_COMMUNITY): Payer: Self-pay | Admitting: *Deleted

## 2015-11-12 ENCOUNTER — Encounter: Payer: Self-pay | Admitting: *Deleted

## 2015-11-12 VITALS — BP 127/88 | HR 90 | Temp 98.6°F | Ht 62.0 in | Wt 110.7 lb

## 2015-11-12 DIAGNOSIS — C090 Malignant neoplasm of tonsillar fossa: Secondary | ICD-10-CM

## 2015-11-12 DIAGNOSIS — C099 Malignant neoplasm of tonsil, unspecified: Secondary | ICD-10-CM

## 2015-11-12 DIAGNOSIS — F17210 Nicotine dependence, cigarettes, uncomplicated: Secondary | ICD-10-CM | POA: Insufficient documentation

## 2015-11-12 DIAGNOSIS — R5381 Other malaise: Secondary | ICD-10-CM

## 2015-11-12 HISTORY — DX: Malignant neoplasm of tonsillar fossa: C09.0

## 2015-11-12 MED ORDER — NICOTINE 14 MG/24HR TD PT24: 14.0000 mg | MEDICATED_PATCH | Freq: Every day | TRANSDERMAL | Status: DC

## 2015-11-12 MED ORDER — VITAMIN B-1 100 MG PO TABS: 100.0000 mg | ORAL_TABLET | Freq: Every day | ORAL | Status: DC

## 2015-11-12 MED ORDER — NICOTINE 21 MG/24HR TD PT24: 21.0000 mg | MEDICATED_PATCH | Freq: Every day | TRANSDERMAL | Status: DC

## 2015-11-12 MED ORDER — LIDOCAINE VISCOUS 2 % MT SOLN: OROMUCOSAL | Status: DC

## 2015-11-12 MED ORDER — NICOTINE 7 MG/24HR TD PT24: 7.0000 mg | MEDICATED_PATCH | Freq: Every day | TRANSDERMAL | Status: DC

## 2015-11-12 MED ORDER — THERA VITAL M PO TABS: 1.0000 | ORAL_TABLET | Freq: Every day | ORAL | Status: DC

## 2015-11-12 NOTE — Telephone Encounter (Signed)
Dr.Squire called to speak with Dr.Penland about this pt

## 2015-11-12 NOTE — Progress Notes (Signed)
.   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 female with history of schizophrenia, drug abuse, who presented with Invasive squamous cell carcinoma after a 2 month history of severe throat pain and a right oropharyngeal mass. Subsequently, the patient saw Dr. Benjamine Mola who performed a direct laryngoscopy and biopsy, which revealed cancer of the right tonsil.   Biopsy of right tonsil on 10/25/15 revealed: Invasive squamous cell carcinoma, p16 negative  Pertinent imaging thus far includes CT of neck with contrast performed on 10/05/15 revealing bilateral palatine tonsil enlargement with asymmetric abnormality of right tonsil and soft palate. Possible right cervical adenopathy. PET scan performed on July 5th revealed bilateral palatine tonsil enlargement and SUV uptake with asymmetric activity in the right palatine tonsil. Bilateral mildly enlarged hyper metabolic level 2 nodes. There are 4 sub centimeter pulmonary nodules that raise concern for lung metastces. There is a 4.9 cm gastric mass, most suggestive of a GIST.  Patient has seen Dr. Whitney Muse, and was advised to stop smoking. The patient, according to Dr. Donald Pore note, was referred to dentistry.  She sees Dr. Whitney Muse again on 11/19/15.        Swallowing issues, if any: Mild issues related to left sided gum pain.   Weight Changes: Denies  Pain status: Left sided gum pain and significant throat pain.   Other symptoms:   Tobacco history, if any: Smokes 1  pack per day   ETOH abuse, if any: 2 40 ounce beers daily. Admits to illicit drug use. Admits to history of snuff use.   Prior cancers, if any: Cervical cancer - reports surgery alone for tx  Patient reports that she has dark stools, denies weight loss. Reports that she is having trouble with tobacco cessation, but would like to start on patches. Patient reports that she has issues with sleeping as well.    PREVIOUS RADIATION THERAPY: No  PAST MEDICAL HISTORY:  has a past medical history of Hypertension; Tobacco abuse; Alcohol abuse; Cocaine abuse; Alcoholic hepatitis ([id number]); Auditory hallucinations; Anxiety disorder; Schizophrenia ([place]); COPD (chronic obstructive pulmonary disease) ([place]); Emphysema; Chronic bronchitis with acute exacerbation ([place]); History of cervical cancer (09/26/2013); No natural teeth; GERD (gastroesophageal reflux disease); Pneumonia; Dysrhythmia (2012); Cancer ([place]); Scoliosis; and Chronic back pain.  Patient reports that she has been having some left shoulder pain. She has had polyps removed to colonoscopy; unclear if was removed from colon or rectum.   PAST SURGICAL HISTORY: Past Surgical History  Procedure Laterality Date  . Abdominal hysterectomy    . Colonoscopy  05/07/2002    Dr.Demason- large approximately 1.5cm polyp pedunculated aproximately 10cm from the anal verge bx= adenomatous polyp  . Esophagogastroduodenoscopy  06/02/2002    Dr. Anthony Sar- mild distal esophagitis w/o stricturing or ulceration. stomach and pylorus are normal. inflammatory changes of proximal duldenum in the first portion.   . Fracture surgery  right wrist    [place]  . Breast biopsy  2005    lt bx  . Direct  laryngoscopy N/A 10/25/2015    Procedure: DIRECT LARYNGOSCOPY WITH BIOPSY OF ORAL PHARYNGEAL LESION;  Surgeon: Leta Baptist, MD;  Location: [facility];  Service: ENT;  Laterality: N/A;  DIRECT LARYNGOSCOPY WITH BIOPSY OF ORAL PHARYNGEAL LESION    FAMILY HISTORY:  family history includes Alcohol abuse in her daughter; Alzheimer's disease in her mother; COPD in her sister; Cancer in her paternal grandmother; Diabetes in her brother, brother, and sister; Gout in her sister; Heart attack in her brother; Heart disease in her sister; Heart failure in her other; Hypertension in her brother; Other in her brother, brother, father, sister, and son; Seizures in her sister and sister.  SOCIAL HISTORY:  reports that she has been smoking Cigarettes.  She has a 48 pack-year smoking history. She has quit using smokeless tobacco. Her smokeless tobacco use included Snuff. She reports that she drinks about 1.2 oz of alcohol per week. She reports that she uses illicit drugs (Cocaine and Marijuana). Patient does work.   ALLERGIES: Benicar and Ibuprofen  MEDICATIONS:  Current Outpatient Prescriptions  Medication Sig Dispense Refill  . albuterol (PROVENTIL HFA;VENTOLIN HFA) 108 (90 BASE) MCG/ACT inhaler Inhale 1-2 puffs into the lungs every 6 (six) hours as needed for wheezing or shortness of breath. 1 Inhaler 0  . amLODipine (NORVASC) 5 MG tablet Take 5 mg by mouth daily.    Marland Kitchen oxyCODONE-acetaminophen (ROXICET) 5-325 MG tablet Take 1 tablet by mouth every 4 (four) hours as needed. 20 tablet 0  . lidocaine (XYLOCAINE) 2 % solution Mix 1 part 2%viscous lidocaine,1part H2O.Swish and swallow 47mL of this mixture,67min before meals and at bedtime, up to QID 100 mL 5  . Multiple Vitamins-Minerals (MULTIVITAMIN) tablet Take 1 tablet by mouth daily. 180 tablet 1  . nicotine (NICODERM CQ - DOSED IN MG/24 HOURS) 14 mg/24hr patch Place 1 patch (14 mg total) onto the skin daily. Apply 21 mg patch daily x 6 wk, then 14mg  patch daily x 2 wk, then 7 mg patch daily x 2 wk 14 patch 0  . nicotine (NICODERM CQ - DOSED IN MG/24 HOURS) 21 mg/24hr patch Place 1 patch (21 mg total) onto the skin daily. Apply 21 mg patch daily x 6 wk, then 14mg  patch daily x 2 wk, then 7 mg patch daily x 2 wk 14 patch 2    . nicotine (NICODERM CQ - DOSED IN MG/24 HR) 7 mg/24hr patch Place 1 patch (7 mg total) onto the skin daily. Apply 21 mg patch daily x 6 wk, then 14mg  patch daily x 2 wk, then 7 mg patch daily x 2 wk 14 patch 0  . thiamine (VITAMIN B-1) 100 MG tablet Take 1 tablet (100 mg total) by mouth daily. 180 tablet 1   No current facility-administered medications for this encounter.    REVIEW OF SYSTEMS:  Notable for that above.   PHYSICAL EXAM:  height is 5\' 2"  (1.575 m) and weight is 110 lb 11.2 oz (50.213 kg). Her temperature is 98.6 F (37 C). Her blood pressure is 127/88 and her pulse is 90. Her oxygen saturation is 99%.   General: Alert and oriented, in no acute distress HEENT: Head is normocephalic. Extraocular movements are intact. Oropharynx is notable for ulcerative right tonsillar mass that extends throughout the right palate and the uvula and crosses midline (I believe over 4cm) and has an erythematous enlarged left tonsil also suspicious for tumor.  Neck: Neck is notable for small tender  suspicions level 2 masses in her neck  bilaterally which are 1-1.5 cm in size. No other palpable cervical or supraclavicular masses Heart: Regular in rate and rhythm with no murmurs, rubs, or gallops. Chest: Clear to auscultation bilaterally, with no rhonchi, wheezes, or rales. Abdomen: Soft, nontender, nondistended, with no rigidity or guarding. Tenderness at base of sternum.  Extremities: No cyanosis or edema. Lymphatics: see Neck Exam Skin: No concerning lesions. Musculoskeletal: symmetric strength and muscle tone throughout. Neurologic: Cranial nerves II through XII are grossly intact. No obvious focalities. Speech is fluent. Coordination is intact. Psychiatric: Alert and conversant and her judgment and insight appear to be intact.    ECOG =  2   0 - Asymptomatic (Fully active, able to carry on all predisease activities without restriction)  1 - Symptomatic but completely ambulatory (Restricted  in physically strenuous activity but ambulatory and able to carry out work of a light or sedentary nature. For example, light housework, office work)  2 - Symptomatic, <50% in bed during the day (Ambulatory and capable of all self care but unable to carry out any work activities. Up and about more than 50% of waking hours)  3 - Symptomatic, >50% in bed, but not bedbound (Capable of only limited self-care, confined to bed or chair 50% or more of waking hours)  4 - Bedbound (Completely disabled. Cannot carry on any self-care. Totally confined to bed or chair)  5 - Death   Eustace Pen MM, Creech RH, Tormey DC, et al. [phone]). "Toxicity and response criteria of the [facility]". [place] Oncol. 5 (6): 649-55   LABORATORY DATA:  Lab Results  Component Value Date   WBC 4.7 04/22/2015   HGB 15.4* 04/22/2015   HCT 44.7 04/22/2015   MCV 97.0 04/22/2015   PLT 357 04/22/2015   CMP     Component Value Date/Time   NA 137 10/22/2015 1039   K 3.8 10/22/2015 1039   CL 104 10/22/2015 1039   CO2 25 10/22/2015 1039   GLUCOSE 100* 10/22/2015 1039   BUN 14 10/22/2015 1039   CREATININE 0.78 10/22/2015 1039   CALCIUM 9.1 10/22/2015 1039   PROT 7.2 10/22/2015 1039   ALBUMIN 3.9 10/22/2015 1039   AST 19 10/22/2015 1039   ALT 12* 10/22/2015 1039   ALKPHOS 79 10/22/2015 1039   BILITOT 0.6 10/22/2015 1039   GFRNONAA >60 10/22/2015 1039   GFRAA >60 10/22/2015 1039      Lab Results  Component Value Date   TSH 0.554 01/05/2013      RADIOGRAPHY: Nm Pet Image Initial (pi) Skull Base To Thigh  11/10/2015  CLINICAL DATA:  Initial treatment strategy for right tonsillar squamous cell carcinoma diagnosed 10/25/2015. EXAM: NUCLEAR MEDICINE PET SKULL BASE TO THIGH TECHNIQUE: 5.7 mCi F-18 FDG was injected intravenously. Full-ring PET imaging was performed from the skull base to thigh after the radiotracer. CT data was obtained and used for attenuation correction and anatomic localization.  FASTING BLOOD GLUCOSE:  Value: 110 mg/dl COMPARISON:  10/05/2015 neck CT. FINDINGS: NECK There is symmetric mild enlargement of the palatine tonsils bilaterally. There is asymmetric hypermetabolism in the right palatine tonsil with max SUV 9.1. There is hypermetabolism in the left palatine tonsil with max SUV 7.6. Mildly enlarged hypermetabolic 1.0 cm right level 2 lymph node near the angle of the mandible with max SUV 5.0 (series 4/image 21). Mildly enlarged hypermetabolic 1.0 cm left level 2 lymph node near the angle of the mandible with max SUV 4.4 (series 4/image 23). No additional hypermetabolic lymph  nodes in the neck. CHEST There is a 0.7 cm left upper lobe pulmonary nodule (series 6/image 23) with associated mild metabolism with max SUV 1.9 (which is considered significant for a nodule of this small size). At least 3 additional smaller pulmonary nodules in the right upper lobe and left lower lobe are below PET resolution. No acute consolidative airspace disease. No hypermetabolic axillary, mediastinal or hilar nodes. Left anterior descending, left circumflex and right coronary atherosclerosis. Atherosclerotic nonaneurysmal thoracic aorta. Mild centrilobular and paraseptal emphysema with mild diffuse bronchial wall thickening. ABDOMEN/PELVIS There is a lobulated solid homogeneous 4.9 x 2.5 cm hypermetabolic exophytic anterior proximal gastric mass with max SUV 24.8 (series 4/image 99). Stomach is collapsed and otherwise grossly normal. No abnormal hypermetabolic activity within the liver, pancreas, adrenal glands, or spleen. No hypermetabolic lymph nodes in the abdomen or pelvis. Atherosclerotic nonaneurysmal abdominal aorta. SKELETON No focal hypermetabolic activity to suggest skeletal metastasis. IMPRESSION: 1. Bilateral palatine tonsil enlargement and hypermetabolism, with asymmetric hypermetabolism in the right palatine tonsil, consistent with primary malignancy in this location. Malignant involvement of  the contralateral (left) palatine tonsil cannot be excluded. 2. Bilateral mildly enlarged hypermetabolic level 2 neck lymph nodes, suspicious for nodal metastases. 3. Four subcentimeter pulmonary nodules scattered in both lungs, the largest of which demonstrates mild metabolism (max SUV 1.9), which is considered significant for a nodule of this small size, and raises concern for pulmonary metastases. 4. Solid lobulated hypermetabolic 4.9 x 2.5 cm gastric mass arising exophytically from the anterior proximal stomach, most suggestive of a gastric GIST. Tissue sampling is advised for this mass, probably best obtained via upper endoscopic approach. 5. Three-vessel coronary atherosclerosis.  Aortic atherosclerosis. 6. Mild centrilobular and paraseptal emphysema with mild diffuse bronchial wall thickening, suggesting COPD. Electronically Signed   By: Ilona Sorrel M.D.   On: 11/10/2015 15:10      IMPRESSION/PLAN:  This is a very nice patient with tonsil head and neck cancer. Concern for second primary in stomach (GIST?) and lung nodules (metastases?)  Stage is at least IVA.  I spoke with Dr. Whitney Muse.  Given patient's comorbidities, I don't think she would tolerate concurrent ChRT well.  Dr. Whitney Muse will start with induction chemotherapy.  We may reevaluate the lung nodules before consideration of following this with RT.  I'll verify the lung nodules aren't feasible to biopsy.  Dr Whitney Muse will order EGD / GI eval of stomach mass.  Dr Whitney Muse has referred her to the supportive services below.  We discussed the potential risks, benefits, and side effects of radiotherapy. We talked in detail about acute and late effects. We discussed that some of the most bothersome acute effects may be mucositis, dysgeusia, salivary changes, skin irritation, hair loss, dehydration, weight loss and fatigue. We talked about late effects which include but are not necessarily limited to dysphagia, hypothyroidism, nerve injury, spinal  cord injury, xerostomia, trismus, and neck edema. No guarantees of treatment were given. A consent form was signed and placed in the patient's medical record. The patient is enthusiastic about proceeding with treatment. I look forward to participating in the patient's care.    Consent forms were signed, but I would like to wait until starting RT as discussed above  We also discussed that the treatment of head and neck cancer is a multidisciplinary process to maximize treatment outcomes and quality of life. For this reasons the following referrals have been or will be made:     Dentistry for dental evaluation, possible extractions in the radiation  fields, and /or advice on reducing risk of cavities, osteoradionecrosis, or other oral issues.   Nutritionist for nutrition support during and after treatment.   Speech language pathology for swallowing and/or speech therapy.   Social work for social support. (Mental illness and drug abuse.)   Physical therapy due to risk of lymphedema in neck and deconditioning. (not urgent  - consider in future)   Baseline labs including TSH.  Of note: patient states that she would like to be aggressive in treatment.   I will make Rx's today for  nicotine patches, viscous lidocaine for pain, and a multivitamin with thiamine for alcoholism  from [facility]   The patient continues to use tobacco. The patient was counseled to stop using tobacco and was offered pharmacotherapy and further counseling to help with this. Will Rx as above. __________________________________________   Eppie Gibson, MD     This document serves as a record of services personally performed by Eppie Gibson , MD. It was created on his behalf by Truddie Hidden, a trained medical scribe. The creation of this record is based on the scribe's personal observations and the provider's statements to them. This document has been checked and approved by the attending provider.

## 2015-11-12 NOTE — Progress Notes (Signed)
  Oncology Nurse Navigator Documentation  Navigator Location: [facility] (11/12/15 1300) Navigator Encounter Type: Initial RadOnc (11/12/15 1300)             Treatment Phase: Pre-Tx/Tx Discussion (11/12/15 1300) Barriers/Navigation Needs: Education (11/12/15 1300) Education: Understanding Cancer/ Treatment Options (11/12/15 1300)     Met with Ms. Wardrop during initial consult with Dr. Isidore Moos.  She was accompanied by her daughter.  1. Further introduced myself as her Navigator, explained my role as a member of the Care Team.   2. Provided New Patient Information packet, discussed contents:  Contact information for physician(s), myself, other members of the Care Team.  Advance Directive information (Enigma blue pamphlet with LCSW contact info)  Fall Prevention Patient Safety Plan  Appointment [place] sheet  [place] [facility] map with highlight of [place] 3. Provided introductory explanation of radiation treatment including SIM planning and purpose of Aquaplast head and shoulder mask, showed them example.   4. I encouraged them to contact me with questions/concerns as treatments/procedures begin.  They verbalized understanding of information provided.    Gayleen Orem, RN, BSN, [place] at [place] [phone]                        Time Spent with Patient: 75 (11/12/15 1300)

## 2015-11-13 ENCOUNTER — Other Ambulatory Visit (HOSPITAL_COMMUNITY): Payer: Self-pay | Admitting: *Deleted

## 2015-11-13 DIAGNOSIS — C099 Malignant neoplasm of tonsil, unspecified: Secondary | ICD-10-CM

## 2015-11-15 ENCOUNTER — Telehealth: Payer: Self-pay | Admitting: *Deleted

## 2015-11-15 NOTE — Telephone Encounter (Signed)
CALLED PATIENT TO ASK ABOUT COMING IN ON 11-17-15 @ 11:30 AM  FOR A LAB, SPOKE WITH PATIENT AND SHE AGREED TO COME THAT DAY FOR THIS.

## 2015-11-16 ENCOUNTER — Ambulatory Visit: Payer: Medicaid Other | Admitting: Gastroenterology

## 2015-11-16 ENCOUNTER — Encounter: Payer: Self-pay | Admitting: Gastroenterology

## 2015-11-16 ENCOUNTER — Other Ambulatory Visit: Payer: Self-pay | Admitting: General Surgery

## 2015-11-16 ENCOUNTER — Other Ambulatory Visit: Payer: Self-pay | Admitting: Radiation Oncology

## 2015-11-16 DIAGNOSIS — C090 Malignant neoplasm of tonsillar fossa: Secondary | ICD-10-CM

## 2015-11-16 DIAGNOSIS — K3189 Other diseases of stomach and duodenum: Secondary | ICD-10-CM

## 2015-11-17 ENCOUNTER — Ambulatory Visit
Admission: RE | Admit: 2015-11-17 | Discharge: 2015-11-17 | Disposition: A | Discharge status: Home / Self Care | Payer: Medicaid Other | Source: Ambulatory Visit | Attending: Radiation Oncology | Admitting: Radiation Oncology

## 2015-11-17 ENCOUNTER — Encounter: Payer: Self-pay | Admitting: *Deleted

## 2015-11-17 ENCOUNTER — Other Ambulatory Visit: Payer: Self-pay | Admitting: Radiology

## 2015-11-17 ENCOUNTER — Encounter (HOSPITAL_COMMUNITY): Payer: Self-pay | Admitting: Dentistry

## 2015-11-17 ENCOUNTER — Ambulatory Visit (HOSPITAL_COMMUNITY): Payer: Self-pay | Admitting: Dentistry

## 2015-11-17 ENCOUNTER — Encounter (INDEPENDENT_AMBULATORY_CARE_PROVIDER_SITE_OTHER): Payer: Self-pay

## 2015-11-17 VITALS — BP 148/85 | HR 82 | Temp 98.2°F

## 2015-11-17 DIAGNOSIS — R682 Dry mouth, unspecified: Secondary | ICD-10-CM

## 2015-11-17 DIAGNOSIS — K036 Deposits [accretions] on teeth: Secondary | ICD-10-CM

## 2015-11-17 DIAGNOSIS — IMO0002 Reserved for concepts with insufficient information to code with codable children: Secondary | ICD-10-CM

## 2015-11-17 DIAGNOSIS — K08409 Partial loss of teeth, unspecified cause, unspecified class: Secondary | ICD-10-CM

## 2015-11-17 DIAGNOSIS — C099 Malignant neoplasm of tonsil, unspecified: Secondary | ICD-10-CM

## 2015-11-17 DIAGNOSIS — K0530 Chronic periodontitis, unspecified: Secondary | ICD-10-CM

## 2015-11-17 DIAGNOSIS — K029 Dental caries, unspecified: Secondary | ICD-10-CM

## 2015-11-17 DIAGNOSIS — Z01818 Encounter for other preprocedural examination: Secondary | ICD-10-CM

## 2015-11-17 DIAGNOSIS — K030 Excessive attrition of teeth: Secondary | ICD-10-CM

## 2015-11-17 DIAGNOSIS — K0401 Reversible pulpitis: Secondary | ICD-10-CM | POA: Diagnosis not present

## 2015-11-17 DIAGNOSIS — M264 Malocclusion, unspecified: Secondary | ICD-10-CM

## 2015-11-17 DIAGNOSIS — K117 Disturbances of salivary secretion: Secondary | ICD-10-CM

## 2015-11-17 DIAGNOSIS — K0889 Other specified disorders of teeth and supporting structures: Secondary | ICD-10-CM

## 2015-11-17 DIAGNOSIS — R1310 Dysphagia, unspecified: Secondary | ICD-10-CM

## 2015-11-17 LAB — TSH: TSH: 1.229 m(IU)/L (ref 0.308–3.960)

## 2015-11-17 NOTE — Patient Instructions (Addendum)

## 2015-11-17 NOTE — Progress Notes (Signed)
[facility] Psychosocial Distress Screening Clinical Social Work  Clinical Social Work was referred by distress screening protocol.  The patient scored a 7 on the Psychosocial Distress Thermometer which indicates severe distress. Clinical Social Worker phoned pt and reviewed chart to assess for distress and other psychosocial needs. Per chart review, pt drinks 2 40s a day and uses cocaine, questionable psych history that mentions schizophrenia, mental retardation as well. CSW called pt and left a message on her phone. CSW phoned daughter and spoke to her and pt at length to assess needs. CSW introduced self and explained role of CSW.   Pt reports to live alone, but has an aide several days a week. She reports she plans to fire her as she "isn't real helpful". CSW stressed to pt she may need additional help and support once she starts treatment. Pt agrees to consider, but would rather her sister and daughters help her out. CSW shared with pt that she may need help with her feeding tube from home health and Dr Whitney Muse will make suggestions for appropriate care. Pt reports her daughter helps get her to the doctor and is a strong support. Her daughter plans to try to get her a land line as no home phone currently and her cell does not work well. CSW encouraged both to consider as pt may need to communicate with medical team regularly.   CSW inquired if she had read through information provided by medical team. Pt reports she has to read "a little at a time". She feels her daughter will let her know. CSW encouraged pt to ask lots of questions if she does not understand what folks are telling her. She agrees.  Pt reports she drinks one 40 oz beer a day, but right now she "is out" so has not had one yet today. Pt denied needing assistance with stopping drinking as "soon as I get that tube, I can't drink anything". CSW educated pt that nutrition and assistance with tube feeds was available and to please let the team know  how they can assist pt. Pt open to CSW following and denied other concerns.   ONCBCN DISTRESS SCREENING 11/12/2015  Screening Type Initial Screening  Distress experienced in past week (1-10) 7  Family Problem type Children  Emotional problem type Nervousness/Anxiety;Adjusting to illness  Physical Problem type Pain;Sleep/insomnia;Mouth sores/swallowing;Talking  Physician notified of physical symptoms Yes   Clinical Social Worker follow up needed: Yes.    If yes, follow up plan: CSW will follow and assist accordingly.  Loren Racer, [place] Tuesdays   [phone]

## 2015-11-17 NOTE — Progress Notes (Signed)
DENTAL CONSULTATION  Date of Consultation:  11/17/2015 Patient Name:   Molly Campbell Date of Birth:   08-31-1960 Medical Record Number: [id number]  VITALS: BP 148/85 mmHg  Pulse 82  Temp(Src) 98.2 F (36.8 C) (Oral)  CHIEF COMPLAINT: Patient referred by Dr. Whitney Muse for a dental consultation.   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 Patient with anticipated induction chemotherapy with Dr. Whitney Muse followed by radiation therapy with Dr. Isidore Moos. Patient is now seen as part of a medically necessary pre-chemoradiation therapy dental protocol examination.  Patient currently is complaining of upper right quadrant tooth pain. Patient describes an intermittent, sharp tooth pain that lasts for hours at a time. This has been occurring for the last several months. Patient describes a pain that reaches a maximum of 7 out of 10 but is currently 3 out of 10 in intensity. The patient last saw a dentist in September of 2010 for a denture adjustment by patient report. This was with Dr. Woodfin Ganja in [place], [place]. Patient had a maxillary partial denture fabricated around 2010, but the patient has since lost that partial denture approximately 2-3 years ago. Patient has no lower partial dentures. Patient denies having regular dental care or periodontal therapy. Patient denies being a dental phobic. Patient does have a history of trauma involving the maxillary anterior teeth approximately 20 years ago.  PROBLEM LIST: Patient Active Problem List   Diagnosis Date Noted  . Right tonsillar squamous cell carcinoma ([place]) 11/04/2015    Priority: Medium  . Malignant neoplasm of tonsillar fossa ([place]) 11/12/2015  . Melena 12/02/2013  . Rectal pain 12/02/2013  . Abdominal pain, epigastric 12/02/2013  . GERD (gastroesophageal reflux disease) 12/02/2013  . Esophageal dysphagia 12/02/2013  . History of cervical cancer  09/26/2013  . Mental retardation 01/07/2013  . Encephalopathy, metabolic 0000000  . Chest pain 01/06/2013  . Atrial fibrillation ([place]) 01/05/2013  . Alcoholic hepatitis [id number]  . HTN (hypertension) 03/14/2011  . Chest pain 03/14/2011  . Alcohol intoxication ([place]) 03/14/2011  . Tobacco abuse   . Alcohol abuse   . Cocaine abuse     PMH: Past Medical History  Diagnosis Date  . Hypertension   . Tobacco abuse   . Alcohol abuse   . Cocaine abuse   . Alcoholic hepatitis [id number]  . Auditory hallucinations   . Anxiety disorder   . Schizophrenia ([place])   . COPD (chronic obstructive pulmonary disease) ([place])   . Emphysema   . Chronic bronchitis with acute exacerbation ([place])   . History of cervical cancer 09/26/2013  . GERD (gastroesophageal reflux disease)   . Pneumonia     hx  . Dysrhythmia 2012    hx AF in er-cocaine-converted back NSR  . Cancer (H[facility])     cervical  . Scoliosis   . Chronic back pain     PSH: Past Surgical History  Procedure Laterality Date  . Abdominal hysterectomy    . Colonoscopy  05/07/2002    Dr.Demason- large approximately 1.5cm polyp pedunculated aproximately 10cm from the anal verge bx= adenomatous polyp  . Esophagogastroduodenoscopy  06/02/2002    Dr. Anthony Sar- mild distal esophagitis w/o stricturing or ulceration. stomach and pylorus are normal. inflammatory changes of proximal duldenum in the first portion.   . Fracture surgery  right wrist    [place]  . Breast biopsy  2005    lt bx  . Direct laryngoscopy N/A 10/25/2015  Procedure: DIRECT LARYNGOSCOPY WITH BIOPSY OF ORAL PHARYNGEAL LESION;  Surgeon: Leta Baptist, MD;  Location: [facility];  Service: ENT;  Laterality: N/A;  DIRECT LARYNGOSCOPY WITH BIOPSY OF ORAL PHARYNGEAL LESION    ALLERGIES: Allergies  Allergen Reactions  . Benicar [Olmesartan] Other (See Comments)    Tachycardia - she was told to never take it again  . Ibuprofen Palpitations    tachycardia     MEDICATIONS: Current Outpatient Prescriptions  Medication Sig Dispense Refill  . albuterol (PROVENTIL HFA;VENTOLIN HFA) 108 (90 BASE) MCG/ACT inhaler Inhale 1-2 puffs into the lungs every 6 (six) hours as needed for wheezing or shortness of breath. 1 Inhaler 0  . amLODipine (NORVASC) 5 MG tablet Take 5 mg by mouth daily.    Marland Kitchen lidocaine (XYLOCAINE) 2 % solution Mix 1 part 2%viscous lidocaine,1part H2O.Swish and swallow 67mL of this mixture,69min before meals and at bedtime, up to QID 100 mL 5  . Multiple Vitamins-Minerals (MULTIVITAMIN) tablet Take 1 tablet by mouth daily. 180 tablet 1  . nicotine (NICODERM CQ - DOSED IN MG/24 HOURS) 14 mg/24hr patch Place 1 patch (14 mg total) onto the skin daily. Apply 21 mg patch daily x 6 wk, then 14mg  patch daily x 2 wk, then 7 mg patch daily x 2 wk 14 patch 0  . nicotine (NICODERM CQ - DOSED IN MG/24 HOURS) 21 mg/24hr patch Place 1 patch (21 mg total) onto the skin daily. Apply 21 mg patch daily x 6 wk, then 14mg  patch daily x 2 wk, then 7 mg patch daily x 2 wk 14 patch 2  . nicotine (NICODERM CQ - DOSED IN MG/24 HR) 7 mg/24hr patch Place 1 patch (7 mg total) onto the skin daily. Apply 21 mg patch daily x 6 wk, then 14mg  patch daily x 2 wk, then 7 mg patch daily x 2 wk 14 patch 0  . oxyCODONE-acetaminophen (ROXICET) 5-325 MG tablet Take 1 tablet by mouth every 4 (four) hours as needed. 20 tablet 0  . thiamine (VITAMIN B-1) 100 MG tablet Take 1 tablet (100 mg total) by mouth daily. 180 tablet 1   No current facility-administered medications for this visit.    LABS: Lab Results  Component Value Date   WBC 4.7 04/22/2015   HGB 15.4* 04/22/2015   HCT 44.7 04/22/2015   MCV 97.0 04/22/2015   PLT 357 04/22/2015      Component Value Date/Time   NA 137 10/22/2015 1039   K 3.8 10/22/2015 1039   CL 104 10/22/2015 1039   CO2 25 10/22/2015 1039   GLUCOSE 100* 10/22/2015 1039   BUN 14 10/22/2015 1039   CREATININE 0.78 10/22/2015 1039   CALCIUM 9.1  10/22/2015 1039   GFRNONAA >60 10/22/2015 1039   GFRAA >60 10/22/2015 1039   Lab Results  Component Value Date   INR 0.96 03/14/2011   INR 0.94 09/08/2010   INR 1.1 01/09/2008   No results found for: PTT  SOCIAL HISTORY: Social History   Social History  . Marital Status: Widowed    Spouse Name: N/A  . Number of Children: 3  . Years of Education: N/A   Occupational History  . Not on file.   Social History Main Topics  . Smoking status: Current Every Day Smoker -- 1.00 packs/day for 48 years    Types: Cigarettes  . Smokeless tobacco: Former Systems developer    Types: Snuff     Comment: One pack a day  . Alcohol Use: 1.2 oz/week  2 Cans of beer per week     Comment: Two 40 ounce bottles a week.  . Drug Use: Yes    Special: Cocaine, Marijuana     Comment: Last cocaine used June 10th  . Sexual Activity: Not Currently    Birth Control/ Protection: Surgical   Other Topics Concern  . Not on file   Social History Narrative    FAMILY HISTORY: Family History  Problem Relation Age of Onset  . Heart failure Other   . Alzheimer's disease Mother   . Other Father     "he was beat to death"  . Heart disease Sister   . Gout Sister   . COPD Sister   . Seizures Sister   . Diabetes Sister   . Diabetes Brother   . Cancer Paternal Grandmother     not sure what kind  . Seizures Sister   . Other Sister     "bones are messed up"  . Hypertension Brother   . Heart attack Brother   . Other Brother     MVA  . Diabetes Brother   . Other Brother     MVA  . Alcohol abuse Daughter   . Other Son     motorcycle accident    REVIEW OF SYSTEMS: Reviewed the ROS with from Dr. Donald Pore note dated 11/04/15 with the patient with changes noted in [facility].  Constitutional: Negative.  HENT: Positive for sore throat.  Eyes: Negative.  Respiratory: Negative.  Cardiovascular: Negative.  Gastrointestinal: Positive for abdominal pain.  Genitourinary: Negative.  Musculoskeletal: Positive  for back pain.  Skin: Negative.  Neurological: Negative.  Endo/Heme/Allergies: Negative.  Psychiatric/Behavioral: Positive for depression and substance abuse. Denies dental phobia. Indicates that she drinks 2- 40 oz cans of beer a day.  All other systems reviewed and are negative.  DENTAL HISTORY: CHIEF COMPLAINT: Patient referred by Dr. Whitney Muse for a dental consultation.   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 Patient with anticipated induction chemotherapy with Dr. Whitney Muse followed by radiation therapy with Dr. Isidore Moos. Patient is now seen as part of a medically necessary pre-chemoradiation therapy dental protocol examination.  Patient currently is complaining of upper right quadrant tooth pain. Patient describes an intermittent, sharp tooth pain that lasts for hours at a time. This has been occurring for the last several months. Patient describes a pain that reaches a maximum of 7 out of 10 but is currently 3 out of 10 in intensity. The patient last saw a dentist in September of 2010 for a denture adjustment by patient report. This was with Dr. Woodfin Ganja in [place], [place]. Patient had a maxillary partial denture fabricated around 2010, but the patient has since lost that partial denture approximately 2-3 years ago. Patient has no lower partial dentures. Patient denies having regular dental care or periodontal therapy. Patient denies being a dental phobic. Patient does have a history of trauma involving the maxillary anterior teeth approximately 20 years ago.  DENTAL EXAMINATION: GENERAL: The patient is a well-developed, slightly built female in no acute distress. HEAD AND NECK: There is no right neck lymphadenopathy palpated. I am able to palpate a left submandibular lymph node that is nontender to palpation. The maximum interincisal opening is measured at 40 mm. The patient denies acute  TMJ symptoms.  The patient has multiple scars involving the the area above the upper lip consistent previous dental trauma approximately 20 years ago. INTRAORAL EXAM:  Patient has xerostomia. Patient has a deep palatal vault. There is no evidence of oral abscess formation. DENTITION: Patient is missing tooth numbers 1,7-11, 16, 19, and 30-32.  There is evidence of generalized maxillary and mandibular occlusal and incisal attrition. PERIODONTAL: Patient has chronic periodontitis with plaque and calculus accumulations, gingival recession, and incipient tooth mobility. There is incipient to moderate bone loss noted. DENTAL CARIES/SUBOPTIMAL RESTORATIONS: Dental caries are noted as per dental charting form. ENDODONTIC: Patient with a history of acute pulpitis symptoms involving the upper right quadrant. There are no obvious periapical radiolucencies noted. CROWN AND BRIDGE: There are no crown or bridge restorations. PROSTHODONTIC: Patient has a history of maxillary partial denture that she lost approximately 2-3 years ago.  There is no history of the lower partial denture. OCCLUSION: Patient has a poor occlusal scheme secondary to multiple missing teeth, generalized incisal and occlusal attrition, and lack replacement of missing teeth with dental prostheses.  RADIOGRAPHIC INTERPRETATION: An orthopantogram was taken and supplemented with 11 periapical radiographs and 2 bite wings. There are multiple missing teeth. There is supra-eruption and drifting of the unopposed teeth into the edentulous areas. There are dental caries noted. There is incipient to moderate bone loss noted. There is evidence of occlusal and incisal attrition. There is a radiopacity near the mesial root of #17-unknown etiology.   ASSESSMENTS: 1. Squamous cell carcinoma of the right tonsil and tonsillar fossa 2. Pre-chemoradiation therapy dental protocol 3. Acute pulpitis 4. Dental caries 5. Chronic periodontitis with bone loss 6.  Accretions 7. Gingival recession 8. Tooth mobility 9. Generalized incisal and occlusal attrition  10. Xerostomia 11. Multiple missing teeth 12. Supra-eruption and drifting of the unopposed teeth into the area edentulous areas 13. History of lost maxillary partial denture 14. Malocclusion  PLAN/RECOMMENDATIONS: 1. I discussed the risks, benefits, and complications of various treatment options with the patient in relationship to her medical and dental conditions, anticipated chemotherapy and radiation therapy, and risk for chemoradiation therapy side effects to include xerostomia, radiation caries, trismus, mucositis, taste changes, gum and jawbone changes, and risk for infection and osteoradionecrosis.   We discussed various treatment options to include no treatment, multiple extractions with alveoloplasty, pre-prosthetic surgery as indicated, periodontal therapy, dental restorations, root canal therapy, crown and bridge therapy, implant therapy, and replacement of missing teeth as indicated. We also discussed fabrication of fluoride trays. The patient agreed to impressions today for the fabrication of the lower fluoride tray. The patient currently wishes to proceed with extraction of all remaining maxillary teeth as well as tooth numbers 17 and 18 with alveoloplasty and gross debridement of remaining dentition in the operating with general anesthesia.  This has been scheduled for 11/24/2015 at 10:30 AM at [facility]. The patient will then proceed with induction chemotherapy at the discretion of Dr. Whitney Muse. Patient will follow the dentist of her choice for fabrication of upper complete denture and possible lower partial denture after adequate healing and approximately 3 months after the last dose of radiation therapy.  The patient is aware of the risk for bleeding, bruising, swelling, pain, nerve damage, stretching of the corners of the mouth, risk for root tip fracture, risk for sinus  involvement, risks for nerve damage, risk for fracture of the jaw, and risk for stress to the temporal mandibular joint. Patient also is aware of the risks for other complications up to and including death due to her overall respiratory compromise.  2. Discussion of findings with medical team and coordination of future medical and  dental care as needed.  I spent in excess of  120 minutes during the conduct of this consultation and >50% of this time involved direct face-to-face encounter for counseling and/or coordination of the patient's care.    Lenn Cal, DDS

## 2015-11-18 ENCOUNTER — Ambulatory Visit (HOSPITAL_COMMUNITY)
Admission: RE | Admit: 2015-11-18 | Discharge: 2015-11-18 | Disposition: A | Discharge status: Home / Self Care | Payer: Medicaid Other | Source: Ambulatory Visit | Attending: Hematology & Oncology | Admitting: Hematology & Oncology

## 2015-11-18 ENCOUNTER — Other Ambulatory Visit (HOSPITAL_COMMUNITY): Payer: Self-pay | Admitting: Hematology & Oncology

## 2015-11-18 ENCOUNTER — Encounter: Payer: Self-pay | Admitting: Dietician

## 2015-11-18 ENCOUNTER — Encounter (HOSPITAL_COMMUNITY): Payer: Self-pay

## 2015-11-18 DIAGNOSIS — I10 Essential (primary) hypertension: Secondary | ICD-10-CM | POA: Diagnosis not present

## 2015-11-18 DIAGNOSIS — F1290 Cannabis use, unspecified, uncomplicated: Secondary | ICD-10-CM | POA: Diagnosis not present

## 2015-11-18 DIAGNOSIS — F209 Schizophrenia, unspecified: Secondary | ICD-10-CM | POA: Diagnosis not present

## 2015-11-18 DIAGNOSIS — F1490 Cocaine use, unspecified, uncomplicated: Secondary | ICD-10-CM | POA: Insufficient documentation

## 2015-11-18 DIAGNOSIS — Z8541 Personal history of malignant neoplasm of cervix uteri: Secondary | ICD-10-CM | POA: Insufficient documentation

## 2015-11-18 DIAGNOSIS — M419 Scoliosis, unspecified: Secondary | ICD-10-CM | POA: Insufficient documentation

## 2015-11-18 DIAGNOSIS — G8929 Other chronic pain: Secondary | ICD-10-CM | POA: Diagnosis not present

## 2015-11-18 DIAGNOSIS — J449 Chronic obstructive pulmonary disease, unspecified: Secondary | ICD-10-CM | POA: Diagnosis not present

## 2015-11-18 DIAGNOSIS — K219 Gastro-esophageal reflux disease without esophagitis: Secondary | ICD-10-CM | POA: Diagnosis not present

## 2015-11-18 DIAGNOSIS — F17210 Nicotine dependence, cigarettes, uncomplicated: Secondary | ICD-10-CM | POA: Insufficient documentation

## 2015-11-18 DIAGNOSIS — C099 Malignant neoplasm of tonsil, unspecified: Secondary | ICD-10-CM | POA: Insufficient documentation

## 2015-11-18 DIAGNOSIS — F419 Anxiety disorder, unspecified: Secondary | ICD-10-CM | POA: Insufficient documentation

## 2015-11-18 DIAGNOSIS — M549 Dorsalgia, unspecified: Secondary | ICD-10-CM | POA: Diagnosis not present

## 2015-11-18 HISTORY — PX: PEG PLACEMENT: SHX5437

## 2015-11-18 HISTORY — PX: PORTACATH PLACEMENT: SHX2246

## 2015-11-18 LAB — BASIC METABOLIC PANEL
Anion gap: 8 (ref 5–15)
BUN: 6 mg/dL (ref 6–20)
CALCIUM: 9.2 mg/dL (ref 8.9–10.3)
CHLORIDE: 107 mmol/L (ref 101–111)
CO2: 25 mmol/L (ref 22–32)
CREATININE: 0.74 mg/dL (ref 0.44–1.00)
GFR calc non Af Amer: >60 mL/min (ref >60)
Glucose, Bld: 106 mg/dL — ABNORMAL HIGH (ref 65–99)
Potassium: 4.6 mmol/L (ref 3.5–5.1)
SODIUM: 140 mmol/L (ref 135–145)

## 2015-11-18 LAB — PROTIME-INR
INR: 0.98 (ref 0.00–1.49)
PROTHROMBIN TIME: 12.8 s (ref 11.6–15.2)

## 2015-11-18 LAB — CBC
HCT: 39.5 % (ref 36.0–46.0)
Hemoglobin: 13.7 g/dL (ref 12.0–15.0)
MCH: 32.8 pg (ref 26.0–34.0)
MCHC: 34.7 g/dL (ref 30.0–36.0)
MCV: 94.5 fL (ref 78.0–100.0)
PLATELETS: 552 10*3/uL — AB (ref 150–400)
RBC: 4.18 MIL/uL (ref 3.87–5.11)
RDW: 13.9 % (ref 11.5–15.5)
WBC: 5.7 10*3/uL (ref 4.0–10.5)

## 2015-11-18 LAB — APTT: aPTT: 33 seconds (ref 24–37)

## 2015-11-18 MED ORDER — MIDAZOLAM HCL 2 MG/2ML IJ SOLN
INTRAMUSCULAR | Status: AC | PRN
  Administered 2015-11-18 (×8): 1 mg via INTRAVENOUS

## 2015-11-18 MED ORDER — CEFAZOLIN SODIUM-DEXTROSE 2-4 GM/100ML-% IV SOLN
2.0000 g | INTRAVENOUS | Status: AC
  Administered 2015-11-18: 2 g via INTRAVENOUS
  Filled 2015-11-18: qty 100

## 2015-11-18 MED ORDER — SODIUM CHLORIDE 0.9 % IV SOLN
INTRAVENOUS | Status: DC
  Administered 2015-11-18: 13:00:00 via INTRAVENOUS

## 2015-11-18 MED ORDER — IOPAMIDOL (ISOVUE-300) INJECTION 61%
50.0000 mL | Freq: Once | INTRAVENOUS | Status: AC | PRN
  Administered 2015-11-18: 5 mL via INTRAVENOUS

## 2015-11-18 MED ORDER — HEPARIN SOD (PORK) LOCK FLUSH 100 UNIT/ML IV SOLN
INTRAVENOUS | Status: AC
  Filled 2015-11-18: qty 5

## 2015-11-18 MED ORDER — FENTANYL CITRATE (PF) 100 MCG/2ML IJ SOLN
INTRAMUSCULAR | Status: AC | PRN
  Administered 2015-11-18: 25 ug via INTRAVENOUS
  Administered 2015-11-18: 50 ug via INTRAVENOUS
  Administered 2015-11-18 (×4): 25 ug via INTRAVENOUS

## 2015-11-18 MED ORDER — LIDOCAINE HCL 1 % IJ SOLN
INTRAMUSCULAR | Status: AC | PRN
  Administered 2015-11-18: 5 mL via INTRADERMAL

## 2015-11-18 MED ORDER — MIDAZOLAM HCL 2 MG/2ML IJ SOLN
INTRAMUSCULAR | Status: AC
  Filled 2015-11-18: qty 6

## 2015-11-18 MED ORDER — LIDOCAINE HCL 1 % IJ SOLN
INTRAMUSCULAR | Status: AC
  Filled 2015-11-18: qty 20

## 2015-11-18 MED ORDER — HEPARIN SOD (PORK) LOCK FLUSH 100 UNIT/ML IV SOLN
INTRAVENOUS | Status: AC | PRN
  Administered 2015-11-18: 500 [IU]

## 2015-11-18 MED ORDER — GLUCAGON HCL RDNA (DIAGNOSTIC) 1 MG IJ SOLR
INTRAMUSCULAR | Status: AC | PRN
  Administered 2015-11-18: .5 mg via INTRAVENOUS

## 2015-11-18 MED ORDER — FENTANYL CITRATE (PF) 100 MCG/2ML IJ SOLN
INTRAMUSCULAR | Status: AC
  Filled 2015-11-18: qty 4

## 2015-11-18 MED ORDER — MIDAZOLAM HCL 2 MG/2ML IJ SOLN
INTRAMUSCULAR | Status: AC
  Filled 2015-11-18: qty 2

## 2015-11-18 MED ORDER — LIDOCAINE-EPINEPHRINE (PF) 2 %-1:200000 IJ SOLN
INTRAMUSCULAR | Status: AC | PRN
  Administered 2015-11-18: 10 mL via INTRADERMAL

## 2015-11-18 MED ORDER — GLUCAGON HCL RDNA (DIAGNOSTIC) 1 MG IJ SOLR
INTRAMUSCULAR | Status: AC
  Filled 2015-11-18: qty 1

## 2015-11-18 NOTE — Procedures (Signed)
Successful RT IJ POWER PORT INSERTION TIP SVC/RA NO COMP ACCESS READY FOR USE FULL REPORT IN PACS  Successful 68fr pull thru Gtube No comp Ready for full use tomorrow Full report in pacs

## 2015-11-18 NOTE — Discharge Instructions (Signed)
Daily dressing changes to feeding tube. Use soap and water to clean around where tube leaves the stomach. Apply triple antibiotic ointment , gauze, and tape to cover the exit site for 7 days. Do this daily for 7 days. No sissors around tube.    Use irrigation tray: Pull up 70 mls of tap water. Flush feeding tube every day. Clamp tube when finished.   Moderate Conscious Sedation, Adult, Care After Refer to this sheet in the next few weeks. These instructions provide you with information on caring for yourself after your procedure. Your health care provider may also give you more specific instructions. Your treatment has been planned according to current medical practices, but problems sometimes occur. Call your health care provider if you have any problems or questions after your procedure. WHAT TO EXPECT AFTER THE PROCEDURE  After your procedure:  You may feel sleepy, clumsy, and have poor balance for several hours.  Vomiting may occur if you eat too soon after the procedure. HOME CARE INSTRUCTIONS  Do not participate in any activities where you could become injured for at least 24 hours. Do not:  Drive.  Swim.  Ride a bicycle.  Operate heavy machinery.  Cook.  Use power tools.  Climb ladders.  Work from a high place.  Do not make important decisions or sign legal documents until you are improved.  If you vomit, drink water, juice, or soup when you can drink without vomiting. Make sure you have little or no nausea before eating solid foods.  Only take over-the-counter or prescription medicines for pain, discomfort, or fever as directed by your health care provider.  Make sure you and your family fully understand everything about the medicines given to you, including what side effects may occur.  You should not drink alcohol, take sleeping pills, or take medicines that cause drowsiness for at least 24 hours.  If you smoke, do not smoke without supervision.  If you are  feeling better, you may resume normal activities 24 hours after you were sedated.  Keep all appointments with your health care provider. SEEK MEDICAL CARE IF:  Your skin is pale or bluish in color.  You continue to feel nauseous or vomit.  Your pain is getting worse and is not helped by medicine.  You have bleeding or swelling.  You are still sleepy or feeling clumsy after 24 hours. SEEK IMMEDIATE MEDICAL CARE IF:  You develop a rash.  You have difficulty breathing.  You develop any type of allergic problem.  You have a fever. MAKE SURE YOU:  Understand these instructions.  Will watch your condition.  Will get help right away if you are not doing well or get worse.   This information is not intended to replace advice given to you by your health care provider. Make sure you discuss any questions you have with your health care provider.   Document Released: 02/12/2013 Document Revised: 05/15/2014 Document Reviewed: 02/12/2013 Elsevier Interactive Patient Education 2016 [place].        Gastrostomy Tube Replacement, Care After Refer to this sheet in the next few weeks. These instructions provide you with information on caring for yourself after your procedure. Your health care provider may also give you more specific instructions. Your treatment has been planned according to current medical practices, but problems sometimes occur. Call your health care provider if you have any problems or questions after your procedure. WHAT TO EXPECT AFTER THE PROCEDURE  After your procedure, it is typical  to have the following:   Mild abdominal pain.  A small amount of blood-tinged fluid leaking from the replacement site. HOME CARE INSTRUCTIONS  You may resume your normal level of activity.  You may resume your normal feedings.  Care for your gastrostomy tube as you did before, or as directed by your health care provider. SEEK MEDICAL CARE IF:  You have a fever or  chills.  You have redness or irritation near the insertion site.  You continue to have abdominal pain or leaking around your gastrostomy tube. SEEK IMMEDIATE MEDICAL CARE IF:   You develop bleeding or significant discharge around the tube.  You have severe abdominal pain.  Your new tube does not seem to be working properly.  You are unable to get feedings into the tube.  Your tube comes out for any reason.    This information is not intended to replace advice given to you by your health care provider. Make sure you discuss any questions you have with your health care provider.   Document Released: 11/19/2013 Document Reviewed: 11/19/2013 Elsevier Interactive Patient Education [employer].

## 2015-11-18 NOTE — Discharge Instructions (Signed)
Gastrostomy Tube Home Guide, Adult °A gastrostomy tube is a tube that is surgically placed into the stomach. It is also called a "G-tube." G-tubes are used when a person is unable to eat and drink enough on their own to stay healthy. The tube is inserted into the stomach through a small cut (incision) in the skin. This tube is used for: °· Feeding. °· Giving medication. °GASTROSTOMY TUBE CARE °· Wash your hands with soap and water. °· Remove the old dressing (if any). Some styles of G-tubes may need a dressing inserted between the skin and the G-tube. Other types of G-tubes do not require a dressing. Ask your health care provider if a dressing is needed. °· Check the area where the tube enters the skin (insertion site) for redness, swelling, or pus-like (purulent) drainage. A small amount of clear or tan liquid drainage is normal. Check to make sure scar tissue (skin) is not growing around the insertion site. This could have a raised, bumpy appearance. °· A cotton swab can be used to clean the skin around the tube: °¨ When the G-tube is first put in, a normal saline solution or water can be used to clean the skin. °¨ Mild soap and warm water can be used when the skin around the G-tube site has healed. °¨ Roll the cotton swab around the G-tube insertion site to remove any drainage or crusting at the insertion site. °STOMACH RESIDUALS °Feeding tube residuals are the amount of liquids that are in the stomach at any given time. Residuals may be checked before giving feedings, medications, or as instructed by your health care provider. °· Ask your health care provider if there are instances when you would not start tube feedings depending on the amount or type of contents withdrawn from the stomach. °· Check residuals by attaching a syringe to the G-tube and pulling back on the syringe plunger. Note the amount, and return the residual back into the stomach. °FLUSHING THE G-TUBE °· The G-tube should be periodically  flushed with clean warm water to keep it from clogging. °¨ Flush the G-tube after feedings or medications. Draw up 30 mL of warm water in a syringe. Connect the syringe to the G-tube and slowly push the water into the tube. °¨ Do not push feedings, medications, or flushes rapidly. Flush the G-tube gently and slowly. °¨ Only use syringes made for G-tubes to flush medications or feedings. °¨ Your health care provider may want the G-tube flushed more often or with more water. If this is the case, follow your health care provider's instructions. °FEEDINGS °Your health care provider will determine whether feedings are given as a bolus (a certain amount given at one time and at scheduled times) or whether feedings will be given continuously on a feeding pump.  °· Formulas should be given at room temperature. °· If feedings are continuous, no more than 4 hours worth of feedings should be placed in the feeding bag. This helps prevent spoilage or accidental excess infusion. °· Cover and place unused formula in the refrigerator. °· If feedings are continuous, stop the feedings when medications or flushes are given. Be sure to restart the feedings. °· Feeding bags and syringes should be replaced as instructed by your health care provider. °GIVING MEDICATION  °· In general, it is best if all medications are in a liquid form for G-tube administration. Liquid medications are less likely to clog the G-tube. °¨ Mix the liquid medication with 30 mL (or amount recommended by   your health care provider) of warm water. °¨ Draw up the medication into the syringe. °¨ Attach the syringe to the G-tube and slowly push the mixture into the G-tube. °¨ After giving the medication, draw up 30 mL of warm water in the syringe and slowly flush the G-tube. °· For pills or capsules, check with your health care provider first before crushing medications. Some pills are not effective if they are crushed. Some capsules are sustained-release  medications. °¨ If appropriate, crush the pill or capsule and mix with 30 mL of warm water. Using the syringe, slowly push the medication through the tube, then flush the tube with another 30 mL of tap water. °G-TUBE PROBLEMS °G-tube was pulled out. °· Cause: May have been pulled out accidentally. °· Solutions: Cover the opening with clean dressing and tape. Call your health care provider right away. The G-tube should be put in as soon as possible (within 4 hours) so the G-tube opening (tract) does not close. The G-tube needs to be put in at a health care setting. An X-ray needs to be done to confirm placement before the G-tube can be used again. °Redness, irritation, soreness, or foul odor around the gastrostomy site. °· Cause: May be caused by leakage or infection. °· Solutions: Call your health care provider right away. °Large amount of leakage of fluid or mucus-like liquid present (a large amount means it soaks clothing). °· Cause: Many reasons could cause the G-tube to leak. °· Solutions: Call your health care provider to discuss the amount of leakage. °Skin or scar tissue appears to be growing where tube enters skin.  °· Cause: Tissue growth may develop around the insertion site if the G-tube is moved or pulled on excessively. °· Solutions: Secure tube with tape so that excess movement does not occur. Call your health care provider. °G-tube is clogged. °· Cause: Thick formula or medication. °· Solutions: Try to slowly push warm water into the tube with a large syringe. Never try to push any object into the tube to unclog it. Do not force fluid into the G-tube. If you are unable to unclog the tube, call your health care provider right away. °TIPS °· Head of bed (HOB) position refers to the upright position of a person's upper body. °¨ When giving medications or a feeding bolus, keep the HOB up as told by your health care provider. Do this during the feeding and for 1 hour after the feeding or medication  administration. °¨ If continuous feedings are being given, it is best to keep the HOB up as told by your health care provider. When ADLs (activities of daily living) are performed and the HOB needs to be flat, be sure to turn the feeding pump off. Restart the feeding pump when the HOB is returned to the recommended height. °· Do not pull or put tension on the tube. °· To prevent fluid backflow, kink the G-tube before removing the cap or disconnecting a syringe. °· Check the G-tube length every day. Measure from the insertion site to the end of the G-tube. If the length is longer than previous measurements, the tube may be coming out. Call your health care provider if you notice increasing G-tube length. °· Oral care, such as brushing teeth, must be continued. °· You may need to remove excess air (vent) from the G-tube. Your health care provider will tell you if this is needed. °· Always call your health care provider if you have questions or problems with the   G-tube. °SEEK IMMEDIATE MEDICAL CARE IF:  °· You have severe abdominal pain, tenderness, or abdominal bloating (distension). °· You have nausea or vomiting. °· You are constipated or have problems moving your bowels. °· The G-tube insertion site is red, swollen, has a foul smell, or has yellow or brown drainage. °· You have difficulty breathing or shortness of breath. °· You have a fever. °· You have a large amount of feeding tube residuals. °· The G-tube is clogged and cannot be flushed. °MAKE SURE YOU:  °· Understand these instructions. °· Will watch your condition. °· Will get help right away if you are not doing well or get worse. °  °This information is not intended to replace advice given to you by your health care provider. Make sure you discuss any questions you have with your health care provider. °  °Document Released: 07/03/2001 Document Revised: 09/08/2014 Document Reviewed: 12/30/2012 °[employer] Interactive Patient Education ©2016 [employer] °Implanted Port Home Guide °An implanted port is a type of central line that is placed under the skin. Central lines are used to provide IV access when treatment or nutrition needs to be given through a person's veins. Implanted ports are used for long-term IV access. An implanted port may be placed because:  °· You need IV medicine that would be irritating to the small veins in your hands or arms.   °· You need long-term IV medicines, such as antibiotics.   °· You need IV nutrition for a long period.   °· You need frequent blood draws for lab tests.   °· You need dialysis.   °Implanted ports are usually placed in the chest area, but they can also be placed in the upper arm, the abdomen, or the leg. An implanted port has two main parts:  °· Reservoir. The reservoir is round and will appear as a small, raised area under your skin. The reservoir is the part where a needle is inserted to give medicines or draw blood.   °· Catheter. The catheter is a thin, flexible tube that extends from the reservoir. The catheter is placed into a large vein. Medicine that is inserted into the reservoir goes into the catheter and then into the vein.   °HOW WILL I CARE FOR MY INCISION SITE? °Do not get the incision site wet. Bathe or shower as directed by your health care provider.  °HOW IS MY PORT ACCESSED? °Special steps must be taken to access the port:  °· Before the port is accessed, a numbing cream can be placed on the skin. This helps numb the skin over the port site.   °· Your health care provider uses a sterile technique to access the port. °· Your health care provider must put on a mask and sterile gloves. °· The skin over your port is cleaned carefully with an antiseptic and allowed to dry. °· The port is gently pinched between sterile gloves, and a needle is inserted into the port. °· Only "non-coring" port needles should be used to access the port. Once the port is accessed, a blood return should be checked. This helps  ensure that the port is in the vein and is not clogged.   °· If your port needs to remain accessed for a constant infusion, a clear (transparent) bandage will be placed over the needle site. The bandage and needle will need to be changed every week, or as directed by your health care provider.   °· Keep the bandage covering the needle clean and dry. Do not get it wet. Follow   your health care provider's instructions on how to take a shower or bath while the port is accessed.   °· If your port does not need to stay accessed, no bandage is needed over the port.   °WHAT IS FLUSHING? °Flushing helps keep the port from getting clogged. Follow your health care provider's instructions on how and when to flush the port. Ports are usually flushed with saline solution or a medicine called heparin. The need for flushing will depend on how the port is used.  °· If the port is used for intermittent medicines or blood draws, the port will need to be flushed:   °¨ After medicines have been given.   °¨ After blood has been drawn.   °¨ As part of routine maintenance.   °· If a constant infusion is running, the port may not need to be flushed.   °HOW LONG WILL MY PORT STAY IMPLANTED? °The port can stay in for as long as your health care provider thinks it is needed. When it is time for the port to come out, surgery will be done to remove it. The procedure is similar to the one performed when the port was put in.  °WHEN SHOULD I SEEK IMMEDIATE MEDICAL CARE? °When you have an implanted port, you should seek immediate medical care if:  °· You notice a bad smell coming from the incision site.   °· You have swelling, redness, or drainage at the incision site.   °· You have more swelling or pain at the port site or the surrounding area.   °· You have a fever that is not controlled with medicine. °  °This information is not intended to replace advice given to you by your health care provider. Make sure you discuss any questions you have with  your health care provider. °  °Document Released: 04/24/2005 Document Revised: 02/12/2013 Document Reviewed: 12/30/2012 °[employer] Interactive Patient Education ©2016 [employer] °Implanted Port Insertion, Care After °Refer to this sheet in the next few weeks. These instructions provide you with information on caring for yourself after your procedure. Your health care provider may also give you more specific instructions. Your treatment has been planned according to current medical practices, but problems sometimes occur. Call your health care provider if you have any problems or questions after your procedure. °WHAT TO EXPECT AFTER THE PROCEDURE °After your procedure, it is typical to have the following:  °· Discomfort at the port insertion site. Ice packs to the area will help. °· Bruising on the skin over the port. This will subside in 3-4 days. °HOME CARE INSTRUCTIONS °· After your port is placed, you will get a manufacturer's information card. The card has information about your port. Keep this card with you at all times.   °· Know what kind of port you have. There are many types of ports available.   °· Wear a medical alert bracelet in case of an emergency. This can help alert health care workers that you have a port.   °· The port can stay in for as long as your health care provider believes it is necessary.   °· A home health care nurse may give medicines and take care of the port.   °· You or a family member can get special training and directions for giving medicine and taking care of the port at home.   °SEEK MEDICAL CARE IF:  °· Your port does not flush or you are unable to get a blood return.   °· You have a fever or chills. °SEEK IMMEDIATE MEDICAL CARE IF: °· You   have new fluid or pus coming from your incision.   °· You notice a bad smell coming from your incision site.   °· You have swelling, pain, or more redness at the incision or port site.   °· You have chest pain or shortness of breath. °  °This  information is not intended to replace advice given to you by your health care provider. Make sure you discuss any questions you have with your health care provider. °  °Document Released: 02/12/2013 Document Revised: 04/29/2013 Document Reviewed: 02/12/2013 °[employer] Interactive Patient Education ©2016 [employer] °Moderate Conscious Sedation, Adult °Sedation is the use of medicines to promote relaxation and relieve discomfort and anxiety. Moderate conscious sedation is a type of sedation. Under moderate conscious sedation you are less alert than normal but are still able to respond to instructions or stimulation. Moderate conscious sedation is used during short medical and dental procedures. It is milder than deep sedation or general anesthesia and allows you to return to your regular activities sooner. °LET YOUR HEALTH CARE PROVIDER KNOW ABOUT:  °· Any allergies you have. °· All medicines you are taking, including vitamins, herbs, eye drops, creams, and over-the-counter medicines. °· Use of steroids (by mouth or creams). °· Previous problems you or members of your family have had with the use of anesthetics. °· Any blood disorders you have. °· Previous surgeries you have had. °· Medical conditions you have. °· Possibility of pregnancy, if this applies. °· Use of cigarettes, alcohol, or illegal drugs. °RISKS AND COMPLICATIONS °Generally, this is a safe procedure. However, as with any procedure, problems can occur. Possible problems include: °· Oversedation. °· Trouble breathing on your own. You may need to have a breathing tube until you are awake and breathing on your own. °· Allergic reaction to any of the medicines used for the procedure. °BEFORE THE PROCEDURE °· You may have blood tests done. These tests can help show how well your kidneys and liver are working. They can also show how well your blood clots. °· A physical exam will be done.   °· Only take medicines as directed by your health care provider.  You may need to stop taking medicines (such as blood thinners, aspirin, or nonsteroidal anti-inflammatory drugs) before the procedure.   °· Do not eat or drink at least 6 hours before the procedure or as directed by your health care provider. °· Arrange for a responsible adult, family member, or friend to take you home after the procedure. He or she should stay with you for at least 24 hours after the procedure, until the medicine has worn off. °PROCEDURE  °· An intravenous (IV) catheter will be inserted into one of your veins. Medicine will be able to flow directly into your body through this catheter. You may be given medicine through this tube to help prevent pain and help you relax. °· The medical or dental procedure will be done. °AFTER THE PROCEDURE °· You will stay in a recovery area until the medicine has worn off. Your blood pressure and pulse will be checked.   °·  Depending on the procedure you had, you may be allowed to go home when you can tolerate liquids and your pain is under control. °  °This information is not intended to replace advice given to you by your health care provider. Make sure you discuss any questions you have with your health care provider. °  °Document Released: 01/17/2001 Document Revised: 05/15/2014 Document Reviewed: 12/30/2012 °[employer] Interactive Patient Education ©2016 [employer] ° °

## 2015-11-18 NOTE — Consult Note (Signed)
Chief Complaint: Patient was seen in consultation today for Port-A-Cath and gastrostomy tube placements  Referring Physician(s): Penland,Shannon K  Supervising Physician: Daryll Brod  Patient Status: Outpatient  History of Present Illness: Molly Campbell is a 55 y.o. 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  Past Medical History  Diagnosis Date  . Hypertension   . Tobacco abuse   . Alcohol abuse   . Cocaine abuse   . Alcoholic hepatitis [id number]  . Auditory hallucinations   . Anxiety disorder   . Schizophrenia ([place])   . COPD (chronic obstructive pulmonary disease) ([place])   . Emphysema   . Chronic bronchitis with acute exacerbation ([place])   . History of cervical cancer 09/26/2013  . GERD (gastroesophageal reflux disease)   . Pneumonia     hx  . Dysrhythmia 2012    hx AF in er-cocaine-converted back NSR  . Cancer (HCC)     cervical  . Scoliosis   . Chronic back pain     Past Surgical History  Procedure Laterality Date  . Abdominal hysterectomy    . Colonoscopy  05/07/2002    Dr.Demason- large approximately 1.5cm polyp pedunculated aproximately 10cm from the anal verge bx= adenomatous polyp  . Esophagogastroduodenoscopy  06/02/2002    Dr. Anthony Sar- mild distal esophagitis w/o stricturing or ulceration. stomach and pylorus are normal. inflammatory changes of proximal duldenum in the first portion.   . Fracture surgery  right wrist    [place]  . Breast biopsy  2005    lt bx  . Direct laryngoscopy N/A 10/25/2015    Procedure: DIRECT LARYNGOSCOPY WITH BIOPSY OF ORAL PHARYNGEAL LESION;  Surgeon: Leta Baptist, MD;  Location: [facility];  Service: ENT;  Laterality: N/A;  DIRECT LARYNGOSCOPY WITH BIOPSY OF ORAL PHARYNGEAL LESION    Allergies: Benicar and Ibuprofen  Medications: Prior to  Admission medications   Medication Sig Start Date End Date Taking? Authorizing Provider  albuterol (PROVENTIL HFA;VENTOLIN HFA) 108 (90 BASE) MCG/ACT inhaler Inhale 1-2 puffs into the lungs every 6 (six) hours as needed for wheezing or shortness of breath. 08/18/14   Hope Bunnie Pion, NP  amLODipine (NORVASC) 5 MG tablet Take 5 mg by mouth daily.    Historical Provider, MD  lidocaine (XYLOCAINE) 2 % solution Mix 1 part 2%viscous lidocaine,1part H2O.Swish and swallow 63mL of this mixture,40min before meals and at bedtime, up to QID 11/12/15   Eppie Gibson, MD  Multiple Vitamins-Minerals (MULTIVITAMIN) tablet Take 1 tablet by mouth daily. Patient not taking: Reported on 11/17/2015 11/12/15   Eppie Gibson, MD  nicotine (NICODERM CQ - DOSED IN MG/24 HOURS) 14 mg/24hr patch Place 1 patch (14 mg total) onto the skin daily. Apply 21 mg patch daily x 6 wk, then 14mg  patch daily x 2 wk, then 7 mg patch daily x 2 wk Patient not taking: Reported on 11/17/2015 11/12/15   Eppie Gibson, MD  nicotine (NICODERM CQ - DOSED IN MG/24 HOURS) 21 mg/24hr patch Place 1 patch (21 mg total) onto the skin daily. Apply 21 mg patch daily x 6 wk, then 14mg  patch daily x 2 wk, then 7 mg patch daily x 2 wk Patient not taking: Reported on 11/17/2015 11/12/15   Eppie Gibson, MD  nicotine (NICODERM CQ - DOSED IN MG/24 HR) 7 mg/24hr patch Place 1 patch (7 mg total) onto the skin daily.  Apply 21 mg patch daily x 6 wk, then 14mg  patch daily x 2 wk, then 7 mg patch daily x 2 wk Patient not taking: Reported on 11/17/2015 11/12/15   Eppie Gibson, MD  oxyCODONE-acetaminophen (ROXICET) 5-325 MG tablet Take 1 tablet by mouth every 4 (four) hours as needed. 10/25/15   Leta Baptist, MD  thiamine (VITAMIN B-1) 100 MG tablet Take 1 tablet (100 mg total) by mouth daily. Patient not taking: Reported on 11/17/2015 11/12/15   Eppie Gibson, MD     Family History  Problem Relation Age of Onset  . Heart failure Other   . Alzheimer's disease Mother   . Other Father     "he  was beat to death"  . Heart disease Sister   . Gout Sister   . COPD Sister   . Seizures Sister   . Diabetes Sister   . Diabetes Brother   . Cancer Paternal Grandmother     not sure what kind  . Seizures Sister   . Other Sister     "bones are messed up"  . Hypertension Brother   . Heart attack Brother   . Other Brother     MVA  . Diabetes Brother   . Other Brother     MVA  . Alcohol abuse Daughter   . Other Son     motorcycle accident    Social History   Social History  . Marital Status: Widowed    Spouse Name: N/A  . Number of Children: 3  . Years of Education: N/A   Social History Main Topics  . Smoking status: Current Every Day Smoker -- 1.00 packs/day for 48 years    Types: Cigarettes  . Smokeless tobacco: Former Systems developer    Types: Snuff     Comment: One pack a day  . Alcohol Use: 1.2 oz/week    2 Cans of beer per week     Comment: Two 40 ounce bottles a week.  . Drug Use: Yes    Special: Cocaine, Marijuana     Comment: Last cocaine used June 10th  . Sexual Activity: Not Currently    Birth Control/ Protection: Surgical   Other Topics Concern  . Not on file   Social History Narrative      Review of Systems  Constitutional: Positive for fatigue and unexpected weight change. Negative for fever and chills.  HENT: Positive for sore throat and trouble swallowing.   Respiratory: Positive for cough.        Some dyspnea with exertion  Cardiovascular: Negative for chest pain.  Gastrointestinal: Negative for nausea, vomiting, abdominal pain and blood in stool.  Genitourinary: Negative for dysuria and hematuria.  Musculoskeletal: Positive for back pain.  Neurological: Negative for headaches.    Vital Signs: Blood pressure 168/95, heart rate 85, temperature 98.6, respirations 16, O2 sat 99% room air Wt 107 lb 12.8 oz (48.898 kg)  Physical Exam  Constitutional: She is oriented to person, place, and time.  Thin BF in NAD  Cardiovascular: Normal rate and  regular rhythm.   Pulmonary/Chest: Effort normal and breath sounds normal.  Abdominal: Soft. Bowel sounds are normal. There is no tenderness.  Musculoskeletal: She exhibits no edema.  Neurological: She is alert and oriented to person, place, and time.    Mallampati Score:     Imaging: Nm Pet Image Initial (pi) Skull Base To Thigh  11/10/2015  CLINICAL DATA:  Initial treatment strategy for right tonsillar squamous cell carcinoma diagnosed 10/25/2015. EXAM: NUCLEAR  MEDICINE PET SKULL BASE TO THIGH TECHNIQUE: 5.7 mCi F-18 FDG was injected intravenously. Full-ring PET imaging was performed from the skull base to thigh after the radiotracer. CT data was obtained and used for attenuation correction and anatomic localization. FASTING BLOOD GLUCOSE:  Value: 110 mg/dl COMPARISON:  10/05/2015 neck CT. FINDINGS: NECK There is symmetric mild enlargement of the palatine tonsils bilaterally. There is asymmetric hypermetabolism in the right palatine tonsil with max SUV 9.1. There is hypermetabolism in the left palatine tonsil with max SUV 7.6. Mildly enlarged hypermetabolic 1.0 cm right level 2 lymph node near the angle of the mandible with max SUV 5.0 (series 4/image 21). Mildly enlarged hypermetabolic 1.0 cm left level 2 lymph node near the angle of the mandible with max SUV 4.4 (series 4/image 23). No additional hypermetabolic lymph nodes in the neck. CHEST There is a 0.7 cm left upper lobe pulmonary nodule (series 6/image 23) with associated mild metabolism with max SUV 1.9 (which is considered significant for a nodule of this small size). At least 3 additional smaller pulmonary nodules in the right upper lobe and left lower lobe are below PET resolution. No acute consolidative airspace disease. No hypermetabolic axillary, mediastinal or hilar nodes. Left anterior descending, left circumflex and right coronary atherosclerosis. Atherosclerotic nonaneurysmal thoracic aorta. Mild centrilobular and paraseptal emphysema  with mild diffuse bronchial wall thickening. ABDOMEN/PELVIS There is a lobulated solid homogeneous 4.9 x 2.5 cm hypermetabolic exophytic anterior proximal gastric mass with max SUV 24.8 (series 4/image 99). Stomach is collapsed and otherwise grossly normal. No abnormal hypermetabolic activity within the liver, pancreas, adrenal glands, or spleen. No hypermetabolic lymph nodes in the abdomen or pelvis. Atherosclerotic nonaneurysmal abdominal aorta. SKELETON No focal hypermetabolic activity to suggest skeletal metastasis. IMPRESSION: 1. Bilateral palatine tonsil enlargement and hypermetabolism, with asymmetric hypermetabolism in the right palatine tonsil, consistent with primary malignancy in this location. Malignant involvement of the contralateral (left) palatine tonsil cannot be excluded. 2. Bilateral mildly enlarged hypermetabolic level 2 neck lymph nodes, suspicious for nodal metastases. 3. Four subcentimeter pulmonary nodules scattered in both lungs, the largest of which demonstrates mild metabolism (max SUV 1.9), which is considered significant for a nodule of this small size, and raises concern for pulmonary metastases. 4. Solid lobulated hypermetabolic 4.9 x 2.5 cm gastric mass arising exophytically from the anterior proximal stomach, most suggestive of a gastric GIST. Tissue sampling is advised for this mass, probably best obtained via upper endoscopic approach. 5. Three-vessel coronary atherosclerosis.  Aortic atherosclerosis. 6. Mild centrilobular and paraseptal emphysema with mild diffuse bronchial wall thickening, suggesting COPD. Electronically Signed   By: Ilona Sorrel M.D.   On: 11/10/2015 15:10    Labs:  CBC:  Recent Labs  04/22/15 1646 11/18/15 1315  WBC 4.7 5.7  HGB 15.4* 13.7  HCT 44.7 39.5  PLT 357 552*    COAGS:  Recent Labs  11/18/15 1315  INR 0.98  APTT 33    BMP:  Recent Labs  04/22/15 1745 10/22/15 1039 11/18/15 1315  NA 135 137 140  K 3.8 3.8 4.6  CL 100*  104 107  CO2 26 25 25   GLUCOSE 114* 100* 106*  BUN 10 14 6   CALCIUM 9.2 9.1 9.2  CREATININE 0.82 0.78 0.74  GFRNONAA >60 >60 >60  GFRAA >60 >60 >60    LIVER FUNCTION TESTS:  Recent Labs  04/22/15 1745 10/22/15 1039  BILITOT 0.3 0.6  AST 25 19  ALT 18 12*  ALKPHOS 79 79  PROT 7.6 7.2  ALBUMIN 4.2 3.9    TUMOR MARKERS: No results for input(s): AFPTM, CEA, CA199, CHROMGRNA in the last 8760 hours.  Assessment and Plan: 55 y.o. 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 Details/risks of procedures, including not limited to, internal bleeding, infection, injury to adjacent structures, venous thrombosis discussed with patient and daughter with their understanding and consent.   Thank you for this interesting consult.  I greatly enjoyed meeting Molly Campbell and look forward to participating in their care.  A copy of this report was sent to the requesting provider on this date.  Electronically Signed: D. Rowe Robert 11/18/2015, 2:17 PM   I spent a total of  25 minutes in face to face in clinical consultation, greater than 50% of which was counseling/coordinating care for Port-A-Cath and percutaneous gastrostomy tube placements

## 2015-11-18 NOTE — Sedation Documentation (Signed)
OG being placed.

## 2015-11-18 NOTE — Progress Notes (Signed)
RD consulted for TF recs as this is new H&N patient. Now s/p PEG placement. This is first office encounter since initial consult.   55 y/o female PMHx with lost of significant co morbidities: Very significant Tobacco abuse (48 pack year), severe etoh abuse (6 pack or 40 oz daily), illicit drug abuse, HTN, alcoholic hepatitis, COPD, Cervical cancer, GERD, anxiety, schizophrenia, mental retardation (2014) Esophageal Dysphagia (2015)  Per Rad Onc MD. No concurrent radiation at this time, start with chemo only. - should be able to have PO intake though will shortly have all teeth removed  Contacted Pt by Visiting prior to her office visit 7/14. Her daughter was also present  Wt Readings from Last 10 Encounters:  11/18/15 107 lb 12.8 oz (48.898 kg)  11/12/15 110 lb 11.2 oz (50.213 kg)  11/04/15 111 lb 12.8 oz (50.712 kg)  10/25/15 111 lb (50.349 kg)  06/09/15 111 lb (50.349 kg)  04/22/15 110 lb (49.896 kg)  08/18/14 110 lb (49.896 kg)  07/12/14 112 lb (50.803 kg)  05/18/14 112 lb (50.803 kg)  12/29/13 110 lb (49.896 kg)   Patient weight has only fallen by 3 lbs. Overall very stable.   When asked how she is eating she states "Not well, since I got this news". She says she is eating small amounts at a time.  She is currently suffering from throat pain that is not controlled by the current pain medication. Denies any n/v/d/c. She has been regular with her BMs and usually has one each morning, though has not yet today. She has pain at site of PEG. Denies any dysphagia/aspiration symptoms.   Pt is advised to continue eating orally as long as safe to do so. She may proceed with radiation therapy later. Educated on  potential detrimental effects of therapy on swallowing function and the need to "excercise" this complex physiological motion as much as possible and for as long as possible to lessen disuse and improve chances of recovery of swallow function post therapy. However PO intake is not reccomended  if notes s/s of potential dyspahgia or aspiration which are taught to pt today. Pt has not yet spoken with ST  RD talked about [employer] assistance program. She would like a case of strawberry. This will be ordered for her next visit.  RD went over different types of TF formulas, methods of administration, potential side effects and importance of flushes. Answered questions regarding TF. [facility] rep was notified of new patient and she will attempt to see pt today.  RD went over TF regimen that is seen below. Pt did not believe she would be able to infuse all the flushes and feeding noted. Daughter states pt doesn't like water. RD stated that is fine, all fluids will count toward the total. Patient started laughing. RD clarified that ETOH does NOT count toward the total. Pt continued laughing.   Pt does seem to be cognitively impaired and appears to have poor functional status. Pt lives on her own and when asked if she thought she could administer the TF herself, her answer was not convincing. RD has major reservations about her being able to administer her own TF and take care of herself nutritionally.   Her history of severe alcohol abuse makes very possible she will attempt to use tube for etoh administration. She is told to ONLY use tube for TF and flushes. In attempt to not put idea in pt head, she is not specifically told not to place ETOH in tube.  Estimated needs and TF recs are as follows:  Most recent anthros:  Ht: 5\' 2"  (157.48 cm) Wt: 107.8 lbs (49 kg) BMI: 19.8  Estimated needs for Chemotherapy, Cancer, and multiple comorbid chronic disease states: Kcals: 1700-1900 kcals daily (35-39 kcals/kg bw) Protein: 69-78 g (1.4-1.6 g/kg bw) Fluid: >1.7 liters fluid (35 ml/kg bw)  TF regimen-initial To meet 100% of needs, Osmolite 1.5: 5 cans via PEG, split into 3  meals of  1.5, 1.5 and 2 cans. Flush with 150 cc before and after each feed  TF regimen Provides:  1778 kcals 75 g Pro  905 ml  fluid + 900 from flushes = 1805 mls  Each feed will consist of ~22 oz liquid, which should be manageable.   Pt is instructed to initially start with 1 can, 3x a day to find best feeding schedule and to become familiar/comfortable with her tube. IF/when her PO intake decreases, will increase to goal of 5 cans. She is given option to play around with TF/Flush regimen as long as she gets the total amount of TF cans/flushes each day.  She is instructed to contact RD if she is, for whatever reason, unable to tolerate TF regimen. Potential reasons told are  n/v/c/d, Reflux or severe distension.    Will follow up at next visit.    Burtis Junes RD, LDN, [facility] Pager: [phone] 11/18/2015 6:18 PM

## 2015-11-19 ENCOUNTER — Encounter (HOSPITAL_COMMUNITY): Payer: Medicaid Other | Attending: Hematology & Oncology | Admitting: Hematology & Oncology

## 2015-11-19 ENCOUNTER — Encounter (HOSPITAL_COMMUNITY): Payer: Self-pay | Admitting: Hematology & Oncology

## 2015-11-19 ENCOUNTER — Other Ambulatory Visit: Payer: Self-pay | Admitting: Dietician

## 2015-11-19 VITALS — BP 138/98 | HR 102 | Temp 98.6°F | Resp 16 | Wt 106.6 lb

## 2015-11-19 DIAGNOSIS — K3189 Other diseases of stomach and duodenum: Secondary | ICD-10-CM

## 2015-11-19 DIAGNOSIS — F1010 Alcohol abuse, uncomplicated: Secondary | ICD-10-CM

## 2015-11-19 DIAGNOSIS — R918 Other nonspecific abnormal finding of lung field: Secondary | ICD-10-CM

## 2015-11-19 DIAGNOSIS — Z720 Tobacco use: Secondary | ICD-10-CM

## 2015-11-19 DIAGNOSIS — R1900 Intra-abdominal and pelvic swelling, mass and lump, unspecified site: Secondary | ICD-10-CM

## 2015-11-19 DIAGNOSIS — K7010 Alcoholic hepatitis without ascites: Secondary | ICD-10-CM

## 2015-11-19 DIAGNOSIS — Z87891 Personal history of nicotine dependence: Secondary | ICD-10-CM

## 2015-11-19 DIAGNOSIS — C090 Malignant neoplasm of tonsillar fossa: Secondary | ICD-10-CM | POA: Diagnosis present

## 2015-11-19 DIAGNOSIS — Z7289 Other problems related to lifestyle: Secondary | ICD-10-CM

## 2015-11-19 DIAGNOSIS — Z789 Other specified health status: Secondary | ICD-10-CM

## 2015-11-19 DIAGNOSIS — C099 Malignant neoplasm of tonsil, unspecified: Secondary | ICD-10-CM

## 2015-11-19 DIAGNOSIS — K219 Gastro-esophageal reflux disease without esophagitis: Secondary | ICD-10-CM | POA: Diagnosis not present

## 2015-11-19 MED ORDER — HYDROCODONE-ACETAMINOPHEN 5-325 MG PO TABS: 1.0000 | ORAL_TABLET | ORAL | Status: DC | PRN

## 2015-11-19 MED ORDER — OSMOLITE 1.5 CAL PO LIQD: ORAL | Status: DC

## 2015-11-19 NOTE — Patient Instructions (Signed)
[place] at [facility] Discharge Instructions  RECOMMENDATIONS MADE BY THE CONSULTANT AND ANY TEST RESULTS WILL BE SENT TO YOUR REFERRING PHYSICIAN.  Will need chemo teaching - Next chemo class is Wednesday December 01, 2015 from 1130 - 1.  Samples of Boost given/pain prescriptions done  Start chemo first week of August      Thank you for choosing [place] at [facility] to provide your oncology and hematology care.  To afford each patient quality time with our provider, please arrive at least 15 minutes before your scheduled appointment time.   Beginning January 23rd 2017 lab work for the [facility] will be done in the  Main lab at [facility] on 1st floor. If you have a lab appointment with the [facility] please come in thru the  Main Entrance and check in at the main information desk  You need to re-schedule your appointment should you arrive 10 or more minutes late.  We strive to give you quality time with our providers, and arriving late affects you and other patients whose appointments are after yours.  Also, if you no show three or more times for appointments you may be dismissed from the clinic at the providers discretion.     Again, thank you for choosing [facility].  Our hope is that these requests will decrease the amount of time that you wait before being seen by our physicians.       _____________________________________________________________  Should you have questions after your visit to [facility], please contact our office at [phone] between the hours of 8:30 a.m. and 4:30 p.m.  Voicemails left after 4:30 p.m. will not be returned until the following business day.  For prescription refill requests, have your pharmacy contact our office.         Resources For Cancer Patients and their Caregivers ? American Cancer Society: Can assist with transportation, wigs, general needs, runs  Look Good Feel Better.        214-177-2752 ? Cancer Care: Provides financial assistance, online support groups, medication/co-pay assistance.  1-800-813-HOPE 937-589-2369) ? [place] Assists [place] cancer patients and their families through emotional , educational and financial support.  617-214-9154 ? [place] DSS Where to apply for food stamps, Medicaid and utility assistance. 534-191-6969 ? [facility]: Transportation to medical appointments. 939-633-8422 ? Social Security Administration: May apply for disability if have a Stage IV cancer. 262-651-7809 562 127 6069 ? [employer], Disability and Transit Services: Assists with nutrition, care and transit needs. [place] Support Programs: @10RELATIVEDAYS @ > Cancer Support Group  2nd Tuesday of the month 1pm-2pm, Journey Room  > Creative Journey  3rd Tuesday of the month 1130am-1pm, Journey Room  > Look Good Feel Better  1st Wednesday of the month 10am-12 noon, Journey Room (Call [facility] to register [phone])

## 2015-11-19 NOTE — Progress Notes (Signed)
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     10/27/2015 Pathology Results    invasive squamous cell carcinoma, p16 NEGATIVE     11/10/2015 PET scan    Bilateral palatine tonsil enlargement and hypermetabolism, with asymmetric hypermetabolism in the right palatine tonsil, consistent with primary malignancy in this location. Bilateral mildly enlarged hypermetabolic level 2 neck lymph nodes.     11/18/2015 Procedure    IR Gastrostomy Tube, fluoroscopic 20 French Pull through Gastrostomy       HISTORY OF PRESENTING ILLNESS: Molly Campbell 55 y.o. 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 She is here today, accompanied by her daughter.She has a known history of tobacco abuse, ETOH abuse, and illicit drug use. She has seen both Dr. Isidore Moos and Dr. Enrique Sack in consultation. She has just had a FT placed. Dr. Isidore Moos has concerns given her social issues regarding her ability to tolerate concurrent therapy, and plan will be for chemotherapy prior to concurrent  treatment to assess tolerance and compliance.   Molly Campbell returns to the [facility] today accompanied by her daughter.  She is scheduled for dental extraction with alveoloplasty in the OR, with general anesthesia, on 7/19. Dr. Enrique Sack. As such, she won't be starting chemo until the first week of August.  She says she's been doing well, though her feeding tube hurts. It was just placed. She's okay with using pain medicine, "whatever she needs." Her daughter notes that she doesn't have any more percocet.  Throughout the appointment, her feeding tube is clearly causing Molly Campbell some discomfort.  Molly Campbell indicates that her mood hasn't been great and her daughter notes that "she wants to be strong in front of me."  She confirms that Ovid Curd talked to her about getting Boost, Ensure, or something else like that. She says she'll gladly try it and would like samples today.   MEDICAL HISTORY:  Past Medical History:  Diagnosis Date  . Alcohol abuse   . Alcoholic hepatitis [id number]  . Anemia   . Anxiety disorder   . Asthma   . Auditory hallucinations   . Cancer (HCC)    cervical  . Chronic back pain   . Chronic bronchitis with acute exacerbation ([place])   . Cocaine abuse   . Constipation   . COPD (chronic obstructive pulmonary disease) ([place])   . Depression   . Dysrhythmia 2012   hx AF in er-cocaine-converted back NSR  . Emphysema   . GERD (gastroesophageal reflux disease)   . Headache   . History of cervical cancer 09/26/2013  . Hypertension   . Malignant neoplasm  of tonsillar fossa ([place]) 11/12/2015  . Pneumonia    hx  . Poor circulation   . Schizophrenia ([place])   . Scoliosis   . Tobacco abuse     SURGICAL HISTORY: Past Surgical History:  Procedure Laterality Date  . ABDOMINAL HYSTERECTOMY    . BREAST BIOPSY  2005   lt bx  . COLONOSCOPY  05/07/2002   Dr.Demason- large approximately 1.5cm polyp pedunculated aproximately 10cm from the anal verge bx= adenomatous  polyp  . DIRECT LARYNGOSCOPY N/A 10/25/2015   Procedure: DIRECT LARYNGOSCOPY WITH BIOPSY OF ORAL PHARYNGEAL LESION;  Surgeon: Leta Baptist, MD;  Location: [facility];  Service: ENT;  Laterality: N/A;  DIRECT LARYNGOSCOPY WITH BIOPSY OF ORAL PHARYNGEAL LESION  . ESOPHAGOGASTRODUODENOSCOPY  06/02/2002   Dr. Anthony Sar- mild distal esophagitis w/o stricturing or ulceration. stomach and pylorus are normal. inflammatory changes of proximal duldenum in the first portion.   . ESOPHAGOGASTRODUODENOSCOPY (EGD) WITH PROPOFOL N/A 11/23/2015   Procedure: ESOPHAGOGASTRODUODENOSCOPY (EGD) WITH PROPOFOL;  Surgeon: Danie Binder, MD;  [facility];  Service: Endoscopy;  Laterality: N/A;  1245  . FRACTURE SURGERY  right wrist   [place]  . MULTIPLE EXTRACTIONS WITH ALVEOLOPLASTY N/A 12/13/2015   Procedure: Extraction of tooth #'s 2,3,4,5,6,12,13,14,15,17,and 18 with alveoloplasty, bilateral maxillary tuberosity reductions, and gross debridement of remaining teeth;  Surgeon: Lenn Cal, DDS;  Location: [facility];  Service: Oral Surgery;  Laterality: N/A;  . PEG PLACEMENT  11/18/15  . PORTACATH PLACEMENT Right 11/18/15    SOCIAL HISTORY: Social History   Social History  . Marital status: Widowed    Spouse name: N/A  . Number of children: 3  . Years of education: N/A   Occupational History  . Not on file.   Social History Main Topics  . Smoking status: Current Every Day Smoker    Packs/day: 1.00    Years: 48.00    Types: Cigarettes  . Smokeless tobacco: Former Systems developer    Types: Snuff     Comment: One pack a day  . Alcohol use 0.0 oz/week     Comment: 40 oz a day  . Drug use:     Types: Cocaine, Marijuana     Comment: as of 12/10/15 - last week used both  . Sexual activity: Not Currently    Birth control/ protection: Surgical   Other Topics Concern  . Not on file   Social History Narrative  . No narrative on file   Born here in [place]. Daughter, 2 daughters. Lost  her son. 10 grandchildren. No great-grandchildren yet. Used to work pulling tobacco. Is a smoker. Close to 2 ppd. Started smoking at the age of 22. She notes "I don't drink water." 2 40 oz bottles of beer a day. (?)  She says she does regular things for hobbies. Her daughter notes that she likes to go fishing.  FAMILY HISTORY: Family History  Problem Relation Age of Onset  . Alzheimer's disease Mother   . Other Father     "he was beat to death"  . Heart disease Sister   . Gout Sister   . COPD Sister   . Seizures Sister   . Diabetes Sister   . Diabetes Brother   . Cancer Paternal Grandmother     not sure what kind  . Seizures Sister   . Other Sister     "bones are messed up"  . Hypertension Brother   . Heart attack Brother   . Other  Brother     MVA  . Diabetes Brother   . Other Brother     MVA  . Alcohol abuse Daughter   . Other Son     motorcycle accident  . Heart failure Other    Mother & father deceased. Mother was 8 when she died. Had Alzheimer's. Father was younger. 4 brothers & 2 sisters living (and she's one of the sisters). There were 13 siblings total.  ALLERGIES:  is allergic to benicar [olmesartan]; ibuprofen; and latex.  MEDICATIONS:  Current Outpatient Prescriptions  Medication Sig Dispense Refill  . albuterol (PROVENTIL HFA;VENTOLIN HFA) 108 (90 BASE) MCG/ACT inhaler Inhale 1-2 puffs into the lungs every 6 (six) hours as needed for wheezing or shortness of breath. 1 Inhaler 0  . amLODipine (NORVASC) 5 MG tablet Take 5 mg by mouth daily.    Marland Kitchen CISPLATIN IV Inject into the vein.    Marland Kitchen dexamethasone (DECADRON) 4 MG tablet Take 2 tablets by mouth once a day on the day after chemotherapy and then take 2 tablets two times a day for 2 days. Take with food. 30 tablet 1  . HYDROcodone-acetaminophen (NORCO) 5-325 MG tablet Take 1-2 tablets by mouth every 6 (six) hours as needed for moderate pain or severe pain. 32 tablet 0  . lidocaine (XYLOCAINE) 2 % solution  Mix 1 part 2%viscous lidocaine,1part H2O.Swish and swallow 26mL of this mixture,71min before meals and at bedtime, up to QID (Patient taking differently: Use as directed 5 mLs in the mouth or throat See admin instructions. Mix 1 part 2% viscous lidocaine with 1part water.Swish and swallow 91mL of this mixture,54min before meals and at bedtime, up to QID) 100 mL 5  . lidocaine-prilocaine (EMLA) cream Apply to affected area once 30 g 3  . Multiple Vitamins-Minerals (MULTIVITAMIN) tablet Take 1 tablet by mouth daily. 180 tablet 1  . nicotine (NICODERM CQ - DOSED IN MG/24 HOURS) 14 mg/24hr patch Place 1 patch (14 mg total) onto the skin daily. Apply 21 mg patch daily x 6 wk, then 14mg  patch daily x 2 wk, then 7 mg patch daily x 2 wk 14 patch 0  . nicotine (NICODERM CQ - DOSED IN MG/24 HOURS) 21 mg/24hr patch Place 1 patch (21 mg total) onto the skin daily. Apply 21 mg patch daily x 6 wk, then 14mg  patch daily x 2 wk, then 7 mg patch daily x 2 wk 14 patch 2  . nicotine (NICODERM CQ - DOSED IN MG/24 HR) 7 mg/24hr patch Place 1 patch (7 mg total) onto the skin daily. Apply 21 mg patch daily x 6 wk, then 14mg  patch daily x 2 wk, then 7 mg patch daily x 2 wk 14 patch 0  . Nutritional Supplements (FEEDING SUPPLEMENT, OSMOLITE 1.5 CAL,) LIQD 5 cans via PEG, split into 3 meals of 1.5, 1.5 and 2 cans. Flush with 150 cc water before and after each feed (Patient taking differently: by PEG Tube route See admin instructions. 5 cans via PEG, split into 3 meals of 1.5, 1.5 and 2 cans. Flush with 150 cc water before and after each feed)  0  . omeprazole (PRILOSEC) 20 MG capsule 1 PO EVERY MORNING 31 capsule 11  . ondansetron (ZOFRAN) 8 MG tablet Take 1 tablet (8 mg total) by mouth 2 (two) times daily as needed. Start on the third day after chemotherapy. 30 tablet 1  . PACLitaxel (TAXOL IV) Inject into the vein. weekly    . polyethylene glycol powder (GLYCOLAX/MIRALAX) powder Take  one capsule and mix in 4-6 ounces of  water, take one to two times daily as needed for constipation. (Patient taking differently: Take 17 g by mouth 2 (two) times daily as needed (constipation). Take one capful and mix in 4-6 ounces of water, take one to two times daily as needed for constipation.) 527 g 3  . prochlorperazine (COMPAZINE) 10 MG tablet Take 1 tablet (10 mg total) by mouth every 6 (six) hours as needed (Nausea or vomiting). 30 tablet 1  . thiamine (VITAMIN B-1) 100 MG tablet Take 1 tablet (100 mg total) by mouth daily. 180 tablet 1   No current facility-administered medications for this visit.    Facility-Administered Medications Ordered in Other Visits  Medication Dose Route Frequency Provider Last Rate Last Dose  . 0.9 %  sodium chloride infusion   Intravenous Continuous Manon Hilding Kefalas, PA-C      . 0.9 %  sodium chloride infusion   Intravenous Continuous Manon Hilding Kefalas, PA-C      . 0.9 %  sodium chloride infusion   Intravenous Continuous Manon Hilding Kefalas, PA-C      . 0.9 %  sodium chloride infusion   Intravenous Continuous Manon Hilding Kefalas, PA-C      . 0.9 %  sodium chloride infusion   Intravenous Continuous Manon Hilding Kefalas, PA-C      . 0.9 %  sodium chloride infusion   Intravenous Continuous Manon Hilding Kefalas, PA-C      . 0.9 %  sodium chloride infusion   Intravenous Continuous Baird Cancer, PA-C        Review of Systems  Constitutional: Negative.   HENT: Positive for sore throat.   Eyes: Negative.   Respiratory: Negative.   Cardiovascular: Negative.   Gastrointestinal: Positive for abdominal pain.       When palpated.  Genitourinary: Negative.   Musculoskeletal: Positive for back pain.  Skin: Negative.   Neurological: Negative.   Endo/Heme/Allergies: Negative.   Psychiatric/Behavioral: Positive for depression and substance abuse.       Indicates that she drinks 2 40 oz cans of beer a day.  All other systems reviewed and are negative.  14 point ROS was done and is otherwise as detailed above or  in HPI    PHYSICAL EXAMINATION: ECOG PERFORMANCE STATUS: 1 - Symptomatic but completely ambulatory  Vitals:   11/19/15 1016  BP: (!) 138/98  Pulse: (!) 102  Resp: 16  Temp: 98.6 F (37 C)   Filed Weights   11/19/15 1016  Weight: 106 lb 9.6 oz (48.4 kg)     Physical Exam  Constitutional: She is oriented to person, place, and time and well-developed, well-nourished, and in no distress.  HENT:  Head: Normocephalic and atraumatic.  Mouth/Throat: Oropharynx is clear and moist.  Glossal palatine arch is gone  Eyes: Conjunctivae and EOM are normal. Pupils are equal, round, and reactive to light. Right eye exhibits no discharge. Left eye exhibits no discharge. No scleral icterus.  Neck: Normal range of motion. Neck supple. No JVD present. No tracheal deviation present. No thyromegaly present.  Cardiovascular: Normal rate, regular rhythm and normal heart sounds.   Pulmonary/Chest: Effort normal and breath sounds normal. No stridor. No respiratory distress. She has no wheezes. She has no rales. She exhibits no tenderness.  Abdominal: Soft. Bowel sounds are normal. She exhibits no distension. There is tenderness. There is guarding. There is no rebound.  Newly placed gastrostomy tube  Musculoskeletal: Normal range of motion. She exhibits  no edema or deformity.  Lymphadenopathy:    She has no cervical adenopathy.  Neurological: She is alert and oriented to person, place, and time. No cranial nerve deficit. Gait normal.  Skin: Skin is warm and dry. No rash noted. No erythema. No pallor.  Nursing note and vitals reviewed.   LABORATORY DATA:  I have reviewed the data as listed Lab Results  Component Value Date   WBC 5.6 12/13/2015   HGB 13.0 12/13/2015   HCT 38.6 12/13/2015   MCV 95.3 12/13/2015   PLT 453 (H) 12/13/2015   CMP     Component Value Date/Time   NA 138 12/13/2015 0653   K 3.6 12/13/2015 0653   CL 106 12/13/2015 0653   CO2 22 12/13/2015 0653   GLUCOSE 79  12/13/2015 0653   BUN 9 12/13/2015 0653   CREATININE 0.67 12/13/2015 0653   CALCIUM 9.1 12/13/2015 0653   PROT 7.2 10/22/2015 1039   ALBUMIN 3.9 10/22/2015 1039   AST 19 10/22/2015 1039   ALT 12 (L) 10/22/2015 1039   ALKPHOS 79 10/22/2015 1039   BILITOT 0.6 10/22/2015 1039   GFRNONAA >60 12/13/2015 0653   GFRAA >60 12/13/2015 [id number]     RADIOGRAPHIC STUDIES: I have personally reviewed the radiological images as listed and agreed with the findings in the report.  CLINICAL DATA:  Initial treatment strategy for right tonsillar squamous cell carcinoma diagnosed 10/25/2015.  EXAM: NUCLEAR MEDICINE PET SKULL BASE TO THIGH  TECHNIQUE: 5.7 mCi F-18 FDG was injected intravenously. Full-ring PET imaging was performed from the skull base to thigh after the radiotracer. CT data was obtained and used for attenuation correction and anatomic localization.  FASTING BLOOD GLUCOSE:  Value: 110 mg/dl  COMPARISON:  10/05/2015 neck CT.  FINDINGS: NECK  There is symmetric mild enlargement of the palatine tonsils bilaterally. There is asymmetric hypermetabolism in the right palatine tonsil with max SUV 9.1. There is hypermetabolism in the left palatine tonsil with max SUV 7.6.  Mildly enlarged hypermetabolic 1.0 cm right level 2 lymph node near the angle of the mandible with max SUV 5.0 (series 4/image 21).  Mildly enlarged hypermetabolic 1.0 cm left level 2 lymph node near the angle of the mandible with max SUV 4.4 (series 4/image 23).  No additional hypermetabolic lymph nodes in the neck.  CHEST  There is a 0.7 cm left upper lobe pulmonary nodule (series 6/image 23) with associated mild metabolism with max SUV 1.9 (which is considered significant for a nodule of this small size). At least 3 additional smaller pulmonary nodules in the right upper lobe and left lower lobe are below PET resolution. No acute consolidative airspace disease.  No hypermetabolic axillary,  mediastinal or hilar nodes. Left anterior descending, left circumflex and right coronary atherosclerosis. Atherosclerotic nonaneurysmal thoracic aorta. Mild centrilobular and paraseptal emphysema with mild diffuse bronchial wall thickening.  ABDOMEN/PELVIS  There is a lobulated solid homogeneous 4.9 x 2.5 cm hypermetabolic exophytic anterior proximal gastric mass with max SUV 24.8 (series 4/image 99). Stomach is collapsed and otherwise grossly normal.  No abnormal hypermetabolic activity within the liver, pancreas, adrenal glands, or spleen. No hypermetabolic lymph nodes in the abdomen or pelvis. Atherosclerotic nonaneurysmal abdominal aorta.  SKELETON  No focal hypermetabolic activity to suggest skeletal metastasis.  IMPRESSION: 1. Bilateral palatine tonsil enlargement and hypermetabolism, with asymmetric hypermetabolism in the right palatine tonsil, consistent with primary malignancy in this location. Malignant involvement of the contralateral (left) palatine tonsil cannot be excluded. 2. Bilateral mildly enlarged hypermetabolic level  2 neck lymph nodes, suspicious for nodal metastases. 3. Four subcentimeter pulmonary nodules scattered in both lungs, the largest of which demonstrates mild metabolism (max SUV 1.9), which is considered significant for a nodule of this small size, and raises concern for pulmonary metastases. 4. Solid lobulated hypermetabolic 4.9 x 2.5 cm gastric mass arising exophytically from the anterior proximal stomach, most suggestive of a gastric GIST. Tissue sampling is advised for this mass, probably best obtained via upper endoscopic approach. 5. Three-vessel coronary atherosclerosis.  Aortic atherosclerosis. 6. Mild centrilobular and paraseptal emphysema with mild diffuse bronchial wall thickening, suggesting COPD.   Electronically Signed   By: Ilona Sorrel M.D.   On: 11/10/2015 15:10    ASSESSMENT & PLAN: Malignant neoplasm of  tonsillar fossa (HCC)   Staging form: Pharynx - Oropharynx, AJCC 7th Edition   - Clinical: Stage IVA (T3, N2c, M0) - Signed by Eppie Gibson, MD on 11/12/2015 Cervical cancer in 1985, treated surgically with a partial hysterectomy. 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 I'll also provide her with a constipation sheet. I have been very upfront with the patient and her daughter about ETOH use and pain medication use. The patient thus far has been very reasonable with pain medication use and requests, nothing excessive.   She has an appointment scheduled with gastroenterology on the 17th for an EGD. If biopsy cannot be obtained of the gastric mass an appointment with IR has already been arranged for biopsy.  Gastric mass is suspicious for GIST. Plan is once biopsy is obtained and pathology is available to present the patient in GI tumor board.   Per Dr. Lanell Persons visit:   Nadea could not tolerate true induction chemotherapy which is per NCCN guidelines controversial anyway. There is however a role for sequential cisplatin/XRT. This may be her best option. She is scheduled for dental extraction with alveoloplasty in the OR, with general anesthesia, on 7/19. Dr. Enrique Sack. Once cleared by Dr. Lawana Chambers we will plan on starting single agent cisplatin either weekly or Q 3 week. We will reassess moving forward.    We'll set a tentative date to start her chemotherapy, and bring her in a few days before she starts chemotherapy and do teaching.  We will send her home with some Ensure and Boost samples as well.  Ms. Koralewski wants [place] to call her daughter with any and all information. I'll also provide Ms. Masten's daughter with a generic chemo information booklet to read as well per her request until formal chemotherapy teaching is  arranged.    Meds ordered this encounter  Medications  . DISCONTD: HYDROcodone-acetaminophen (NORCO/VICODIN) 5-325 MG tablet    Sig: Take 1 tablet by mouth every 4 (four) hours as needed for moderate pain.    Dispense:  90 tablet    Refill:  0   All questions were answered. The patient knows to call the clinic with any problems, questions or concerns.  This document serves as a record of services personally performed by Ancil Linsey, MD. It was created on her behalf by Toni Amend, a trained medical scribe. The creation of this record is based on the scribe's personal observations and the provider's statements to them. This document has been checked and approved by the attending provider.  I have reviewed the above documentation for accuracy and completeness and I agree  with the above.  This note was electronically signed.    Molli Hazard, MD  12/14/2015 7:19 PM

## 2015-11-21 ENCOUNTER — Encounter (HOSPITAL_COMMUNITY): Payer: Self-pay | Admitting: Hematology & Oncology

## 2015-11-22 ENCOUNTER — Encounter: Payer: Self-pay | Admitting: Gastroenterology

## 2015-11-22 ENCOUNTER — Encounter (HOSPITAL_COMMUNITY)
Admission: RE | Admit: 2015-11-22 | Discharge: 2015-11-22 | Disposition: A | Discharge status: Home / Self Care | Payer: Medicaid Other | Source: Ambulatory Visit | Attending: Gastroenterology | Admitting: Gastroenterology

## 2015-11-22 ENCOUNTER — Encounter (HOSPITAL_COMMUNITY): Payer: Self-pay

## 2015-11-22 ENCOUNTER — Other Ambulatory Visit: Payer: Self-pay

## 2015-11-22 ENCOUNTER — Encounter (HOSPITAL_COMMUNITY)
Admission: RE | Admit: 2015-11-22 | Discharge: 2015-11-22 | Disposition: A | Discharge status: Home / Self Care | Payer: Medicaid Other | Source: Ambulatory Visit | Attending: Dentistry | Admitting: Dentistry

## 2015-11-22 ENCOUNTER — Encounter (HOSPITAL_COMMUNITY): Payer: Self-pay | Admitting: Emergency Medicine

## 2015-11-22 ENCOUNTER — Ambulatory Visit (INDEPENDENT_AMBULATORY_CARE_PROVIDER_SITE_OTHER): Payer: Medicaid Other | Admitting: Gastroenterology

## 2015-11-22 VITALS — BP 141/98 | HR 86 | Temp 97.7°F | Ht 62.0 in | Wt 106.2 lb

## 2015-11-22 DIAGNOSIS — Z01812 Encounter for preprocedural laboratory examination: Secondary | ICD-10-CM | POA: Diagnosis not present

## 2015-11-22 DIAGNOSIS — Z79899 Other long term (current) drug therapy: Secondary | ICD-10-CM | POA: Insufficient documentation

## 2015-11-22 DIAGNOSIS — Z01818 Encounter for other preprocedural examination: Secondary | ICD-10-CM | POA: Insufficient documentation

## 2015-11-22 DIAGNOSIS — J449 Chronic obstructive pulmonary disease, unspecified: Secondary | ICD-10-CM | POA: Diagnosis not present

## 2015-11-22 DIAGNOSIS — F17200 Nicotine dependence, unspecified, uncomplicated: Secondary | ICD-10-CM | POA: Diagnosis not present

## 2015-11-22 DIAGNOSIS — K319 Disease of stomach and duodenum, unspecified: Secondary | ICD-10-CM | POA: Diagnosis not present

## 2015-11-22 DIAGNOSIS — K3189 Other diseases of stomach and duodenum: Secondary | ICD-10-CM

## 2015-11-22 DIAGNOSIS — I10 Essential (primary) hypertension: Secondary | ICD-10-CM | POA: Insufficient documentation

## 2015-11-22 DIAGNOSIS — F1410 Cocaine abuse, uncomplicated: Secondary | ICD-10-CM | POA: Diagnosis not present

## 2015-11-22 DIAGNOSIS — R1013 Epigastric pain: Secondary | ICD-10-CM | POA: Diagnosis not present

## 2015-11-22 DIAGNOSIS — C090 Malignant neoplasm of tonsillar fossa: Secondary | ICD-10-CM | POA: Diagnosis not present

## 2015-11-22 DIAGNOSIS — K5900 Constipation, unspecified: Secondary | ICD-10-CM

## 2015-11-22 DIAGNOSIS — K0530 Chronic periodontitis, unspecified: Secondary | ICD-10-CM | POA: Insufficient documentation

## 2015-11-22 DIAGNOSIS — F1010 Alcohol abuse, uncomplicated: Secondary | ICD-10-CM | POA: Insufficient documentation

## 2015-11-22 DIAGNOSIS — R9431 Abnormal electrocardiogram [ECG] [EKG]: Secondary | ICD-10-CM | POA: Insufficient documentation

## 2015-11-22 DIAGNOSIS — C099 Malignant neoplasm of tonsil, unspecified: Secondary | ICD-10-CM

## 2015-11-22 HISTORY — DX: Headache, unspecified: R51.9

## 2015-11-22 HISTORY — DX: Major depressive disorder, single episode, unspecified: F32.9

## 2015-11-22 HISTORY — DX: Depression, unspecified: F32.A

## 2015-11-22 HISTORY — DX: Headache: R51

## 2015-11-22 MED ORDER — POLYETHYLENE GLYCOL 3350 17 GM/SCOOP PO POWD: ORAL | Status: DC

## 2015-11-22 NOTE — Progress Notes (Signed)
Cc'ed to pcp °

## 2015-11-22 NOTE — Pre-Procedure Instructions (Addendum)
Molly Campbell  11/22/2015      [facility] - [place], [address] [place] [address] Phone: [phone] Fax: [phone]    Your procedure is scheduled on 11/24/15.  [facility] at 830 A.M.  Call this number if you have problems the morning of surgery:  [phone]   Remember:  Do not eat food or drink liquids after midnight.  Take these medicines the morning of surgery with A SIP OF WATER inhaler if needed(bring with you),amlodipine(norvasc),hydrocodone  if needed  STOP all herbel meds, nsaids (aleve,naproxen,advil,ibuprofen) Starting today including vitamins, aspirin   Do not wear jewelry, make-up or nail polish.  Do not wear lotions, powders, or perfumes.  You may wear deoderant.  Do not shave 48 hours prior to surgery.  Men may shave face and neck.  Do not bring valuables to the hospital.  [facility] is not responsible for any belongings or valuables.  Contacts, dentures or bridgework may not be worn into surgery.  Leave your suitcase in the car.  After surgery it may be brought to your room.  For patients admitted to the hospital, discharge time will be determined by your treatment team.  Patients discharged the day of surgery will not be allowed to drive home.   Name and phone number of your driver:   Special instructions:   Special Instructions: [place] - Preparing for Surgery  Before surgery, you can play an important role.  Because skin is not sterile, your skin needs to be as free of germs as possible.  You can reduce the number of germs on you skin by washing with CHG (chlorahexidine gluconate) soap before surgery.  CHG is an antiseptic cleaner which kills germs and bonds with the skin to continue killing germs even after washing.  Please DO NOT use if you have an allergy to CHG or antibacterial soaps.  If your skin becomes reddened/irritated stop using the CHG and inform your nurse  when you arrive at Short Stay.  Do not shave (including legs and underarms) for at least 48 hours prior to the first CHG shower.  You may shave your face.  Please follow these instructions carefully:   1.  Shower with CHG Soap the night before surgery and the morning of Surgery.  2.  If you choose to wash your hair, wash your hair first as usual with your normal shampoo.  3.  After you shampoo, rinse your hair and body thoroughly to remove the Shampoo.  4.  Use CHG as you would any other liquid soap.  You can apply chg directly  to the skin and wash gently with scrungie or a clean washcloth.  5.  Apply the CHG Soap to your body ONLY FROM THE NECK DOWN.  Do not use on open wounds or open sores.  Avoid contact with your eyes ears, mouth and genitals (private parts).  Wash genitals (private parts)       with your normal soap.  6.  Wash thoroughly, paying special attention to the area where your surgery will be performed.  7.  Thoroughly rinse your body with warm water from the neck down.  8.  DO NOT shower/wash with your normal soap after using and rinsing off the CHG Soap.  9.  Pat yourself dry with a clean towel.            10.  Wear clean pajamas.  11.  Place clean sheets on your bed the night of your first shower and do not sleep with pets.  Day of Surgery  Do not apply any lotions/deodorants the morning of surgery.  Please wear clean clothes to the hospital/surgery center.  Please read over the following fact sheets that you were given.

## 2015-11-22 NOTE — Patient Instructions (Addendum)
1. For constipation, you can use Miralax one capful mixed in 4-6 ounces of water once or twice daily as needed. RX sent to your pharmacy. Your insurance may require you to buy over the counter but your pharmacist will let you know. 2. If you continue to have constipation, please let me know. 3. I have scheduled you for an upper endoscopy to further evaluate the lesion of your stomach. See separate instructions.

## 2015-11-22 NOTE — Progress Notes (Signed)
Anesthesia Chart Review:  Pt is a 55 year old female scheduled for multiple dental extractions with alveoloplasty and gross debridement of teeth on 11/24/2015 with Teena Dunk, DDS.   PMH includes:  HTN, atrial fibrillation (x1 in 2014, assoc with cocaine?, converted back to NSR), schizophrenia, COPD, emphysema, cancer of tonsillar fossa. Alcohol abuse (40oz per day). Cocaine abuse (last used 2 weeks ago). Current smoker. BMI 19.5  Medications include: albuterol, amlodipine  Preoperative labs reviewed.    Chest x-ray 04/22/15 reviewed. COPD changes. No acute abnormalities.   EKG 11/22/15: NSR. Right atrial enlargement. T wave abnormality, consider anterolateral ischemia. Ischemia is new compared to EKG 04/22/15.   Echo 01/07/13:  - Left ventricle: The cavity size was normal. There was moderate concentric hypertrophy. Systolic function was vigorous. The estimated ejection fraction was in the range of 65% to 70%. Wall motion was normal; there were no regional wall motion abnormalities. Left ventriculardiastolic function parameters were normal. - Mitral valve: Mildly thickened leaflets. Mild regurgitation. - Right atrium: Central venous pressure: 18mm Hg (est). 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 No stalk seen. Not very mobile. - Tricuspid valve: Trivial regurgitation. - Pulmonary arteries: PA peak pressure: 35mm Hg (S). - Pericardium, extracardiac: There was no pericardial effusion.  Reviewed case with Dr. Kalman Shan. Pt will need cardiology evaluation prior to surgery. Left message at Dr. Ritta Slot office.   Willeen Cass, FNP-BC [facility]/Anesthesiology Phone: [phone] 11/22/2015 4:43 PM

## 2015-11-22 NOTE — Progress Notes (Signed)
Quick Note:  Forwarding to [facility] for cardiology referral. ______

## 2015-11-22 NOTE — Progress Notes (Signed)
Primary Care Physician:  Rosita Fire, MD  Primary Gastroenterologist:  Barney Drain, MD   Chief Complaint  Patient presents with  . need a biopsy    HPI:  Molly Campbell is a 55 y.o. female here At the request of Dr. Whitney Muse for further evaluation of abnormal stomach seen on recent PET scan. Patient recently diagnosed with tonsillar cancer. Plans for low-dose chemotherapy and radiation therapy. She had a feeding tube placed by interventional radiology last Thursday. 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 Also with bilateral mildly enlarged hypermetabolic level II neck lymph node suspicious for nodal metastasis. Some concern for pulmonary metastasis. No evidence of hypermetabolic activity in the abdomen.   Patient's last colonoscopy was in 2003 by Dr.Demason. She had a large adenomatous polyp removed at that time. We saw her in 2015 to schedule colonoscopy, she no showed for the procedure.   Patient has noted some constipation since taking pain medication that was started around the time of her gastrostomy tube placement last week. Usually had regular bowel movements. No blood in the stool or melena. She's had some intermittent abdominal cramping, midline. 2 episodes of vomiting since Thursday. Denies heartburn. She has not started using her feeding tube except for flushing. Plans to pick up nicotine patches today. Try to cut back on alcohol consumption, still drinking 40 ounces of beer daily.    Current Outpatient Prescriptions  Medication Sig Dispense Refill  . albuterol (PROVENTIL HFA;VENTOLIN HFA) 108 (90 BASE) MCG/ACT inhaler Inhale 1-2 puffs into the lungs every 6 (six) hours as needed for wheezing or shortness of breath. 1 Inhaler 0  . amLODipine (NORVASC) 5 MG tablet Take 5 mg by mouth daily.    Marland Kitchen HYDROcodone-acetaminophen (NORCO/VICODIN) 5-325 MG tablet Take 1 tablet by mouth every 4 (four) hours as needed  for moderate pain. 90 tablet 0  . lidocaine (XYLOCAINE) 2 % solution Mix 1 part 2%viscous lidocaine,1part H2O.Swish and swallow 50mL of this mixture,90min before meals and at bedtime, up to QID 100 mL 5  . Multiple Vitamins-Minerals (MULTIVITAMIN) tablet Take 1 tablet by mouth daily. 180 tablet 1  . nicotine (NICODERM CQ - DOSED IN MG/24 HOURS) 14 mg/24hr patch Place 1 patch (14 mg total) onto the skin daily. Apply 21 mg patch daily x 6 wk, then 14mg  patch daily x 2 wk, then 7 mg patch daily x 2 wk (Patient not taking: Reported on 11/17/2015) 14 patch 0  . nicotine (NICODERM CQ - DOSED IN MG/24 HOURS) 21 mg/24hr patch Place 1 patch (21 mg total) onto the skin daily. Apply 21 mg patch daily x 6 wk, then 14mg  patch daily x 2 wk, then 7 mg patch daily x 2 wk (Patient not taking: Reported on 11/17/2015) 14 patch 2  . nicotine (NICODERM CQ - DOSED IN MG/24 HR) 7 mg/24hr patch Place 1 patch (7 mg total) onto the skin daily. Apply 21 mg patch daily x 6 wk, then 14mg  patch daily x 2 wk, then 7 mg patch daily x 2 wk (Patient not taking: Reported on 11/17/2015) 14 patch 0  . Nutritional Supplements (FEEDING SUPPLEMENT, OSMOLITE 1.5 CAL,) LIQD 5 cans via PEG, split into 3 meals of 1.5, 1.5 and 2 cans. Flush with 150 cc water before and after each feed (Patient not taking: Reported on 11/22/2015)  0  . oxyCODONE-acetaminophen (ROXICET) 5-325 MG tablet Take 1 tablet by mouth every 4 (four) hours as needed. (Patient not  taking: Reported on 11/22/2015) 20 tablet 0  . thiamine (VITAMIN B-1) 100 MG tablet Take 1 tablet (100 mg total) by mouth daily. (Patient not taking: Reported on 11/17/2015) 180 tablet 1   No current facility-administered medications for this visit.    Allergies as of 11/22/2015 - Review Complete 11/22/2015  Allergen Reaction Noted  . Benicar [olmesartan] Other (See Comments) 11/04/2015  . Ibuprofen Palpitations 07/12/2014    Past Medical History  Diagnosis Date  . Hypertension   . Tobacco abuse    . Alcohol abuse   . Cocaine abuse   . Alcoholic hepatitis [id number]  . Auditory hallucinations   . Anxiety disorder   . Schizophrenia ([place])   . COPD (chronic obstructive pulmonary disease) ([place])   . Emphysema   . Chronic bronchitis with acute exacerbation ([place])   . History of cervical cancer 09/26/2013  . GERD (gastroesophageal reflux disease)   . Pneumonia     hx  . Dysrhythmia 2012    hx AF in er-cocaine-converted back NSR  . Cancer ([facility])     cervical  . Scoliosis   . Chronic back pain   . Malignant neoplasm of tonsillar fossa ([place]) 11/12/2015    Past Surgical History  Procedure Laterality Date  . Abdominal hysterectomy    . Colonoscopy  05/07/2002    Dr.Demason- large approximately 1.5cm polyp pedunculated aproximately 10cm from the anal verge bx= adenomatous polyp  . Esophagogastroduodenoscopy  06/02/2002    Dr. Anthony Sar- mild distal esophagitis w/o stricturing or ulceration. stomach and pylorus are normal. inflammatory changes of proximal duldenum in the first portion.   . Fracture surgery  right wrist    [place]  . Breast biopsy  2005    lt bx  . Direct laryngoscopy N/A 10/25/2015    Procedure: DIRECT LARYNGOSCOPY WITH BIOPSY OF ORAL PHARYNGEAL LESION;  Surgeon: Leta Baptist, MD;  Location: [facility];  Service: ENT;  Laterality: N/A;  DIRECT LARYNGOSCOPY WITH BIOPSY OF ORAL PHARYNGEAL LESION    Family History  Problem Relation Age of Onset  . Heart failure Other   . Alzheimer's disease Mother   . Other Father     "he was beat to death"  . Heart disease Sister   . Gout Sister   . COPD Sister   . Seizures Sister   . Diabetes Sister   . Diabetes Brother   . Cancer Paternal Grandmother     not sure what kind  . Seizures Sister   . Other Sister     "bones are messed up"  . Hypertension Brother   . Heart attack Brother   . Other Brother     MVA  . Diabetes Brother   . Other Brother     MVA  . Alcohol abuse Daughter   . Other Son     motorcycle  accident    Social History   Social History  . Marital Status: Widowed    Spouse Name: N/A  . Number of Children: 3  . Years of Education: N/A   Occupational History  . Not on file.   Social History Main Topics  . Smoking status: Current Every Day Smoker -- 1.00 packs/day for 48 years    Types: Cigarettes  . Smokeless tobacco: Former Systems developer    Types: Snuff     Comment: One pack a day  . Alcohol Use: 1.2 oz/week    2 Cans of beer per week     Comment: Two 40 ounce bottles  a week.  . Drug Use: Yes    Special: Cocaine, Marijuana     Comment: Last cocaine used June 10th  . Sexual Activity: Not Currently    Birth Control/ Protection: Surgical   Other Topics Concern  . Not on file   Social History Narrative      ROS:  General: Negative for anorexia,  , fever, chills, fatigue, weakness. Weight down 4 pounds in the past month. Eyes: Negative for vision changes.  ENT: Negative for hoarseness, difficulty swallowing , nasal congestion.c/o sorethroat CV: Negative for chest pain, angina, palpitations, dyspnea on exertion, peripheral edema.  Respiratory: Negative for dyspnea at rest, dyspnea on exertion, cough, sputum, wheezing.  GI: See history of present illness. GU:  Negative for dysuria, hematuria, urinary incontinence, urinary frequency, nocturnal urination.  MS: Negative for joint pain, low back pain.  Derm: Negative for rash or itching.  Neuro: Negative for weakness, abnormal sensation, seizure, frequent headaches, memory loss, confusion.  Psych: Negative for anxiety, depression, suicidal ideation, hallucinations.  Endo: Negative for unusual weight change.  Heme: Negative for bruising or bleeding. Allergy: Negative for rash or hives.    Physical Examination:  BP 141/98 mmHg  Pulse 86  Temp(Src) 97.7 F (36.5 C) (Oral)  Ht 5\' 2"  (1.575 m)  Wt 106 lb 3.2 oz (48.172 kg)  BMI 19.42 kg/m2   General: Well-nourished, well-developed in no acute distress. Appears older  than stated age. Head: Normocephalic, atraumatic.   Eyes: Conjunctiva pink, no icterus. Mouth: Oropharyngeal mucosa moist and pink , no lesions erythema or exudate. Neck: Supple without thyromegaly, masses, or lymphadenopathy.  Lungs: Clear to auscultation bilaterally.  Heart: Regular rate and rhythm, no murmurs rubs or gallops.  Abdomen: Bowel sounds are normal, mild tenderness around g-tube. No erythema or drainage. nondistended, no hepatosplenomegaly or masses, no abdominal bruits or    hernia , no rebound or guarding.   Rectal: not performed Extremities: No lower extremity edema. No clubbing or deformities.  Neuro: Alert and oriented x 4 , grossly normal neurologically.  Skin: Warm and dry, no rash or jaundice.   Psych: Alert and cooperative, normal mood and affect.  Labs: Lab Results  Component Value Date   CREATININE 0.74 11/18/2015   BUN 6 11/18/2015   NA 140 11/18/2015   K 4.6 11/18/2015   CL 107 11/18/2015   CO2 25 11/18/2015   Lab Results  Component Value Date   ALT 12* 10/22/2015   AST 19 10/22/2015   ALKPHOS 79 10/22/2015   BILITOT 0.6 10/22/2015   Lab Results  Component Value Date   INR 0.98 11/18/2015   INR 0.96 03/14/2011   INR 0.94 09/08/2010   Lab Results  Component Value Date   WBC 5.7 11/18/2015   HGB 13.7 11/18/2015   HCT 39.5 11/18/2015   MCV 94.5 11/18/2015   PLT 552* 11/18/2015   Lab Results  Component Value Date   TSH 1.229 11/17/2015     Imaging Studies: Ir Gastrostomy Tube Mod Sed  11/18/2015  INDICATION: TONSILLAR SQUAMOUS CELL CARCINOMA, HEAD AND NECK CANCER, FEEDING TUBE FOR NUTRITION. EXAM: FLUOROSCOPIC 20 FRENCH PULL-THROUGH GASTROSTOMY Date:  7/13/20177/13/2017 4:32 pm Radiologist:  M. Daryll Brod, MD Guidance:  Fluoroscopic MEDICATIONS: 2 g Ancef; Antibiotics were administered within 1 hour of the procedure. Glucagon 1 mg IV ANESTHESIA/SEDATION: Versed 4 mg IV; Fentanyl 75 mcg IV Moderate Sedation Time:  18 minutes The patient was  continuously monitored during the procedure by the interventional radiology nurse under my direct supervision.  CONTRAST:  10 cc Isovue 300 - administered into the gastric lumen. FLUOROSCOPY TIME:  Fluoroscopy Time: 8 minutes 0 seconds (51 mGy). COMPLICATIONS: None immediate. PROCEDURE: Informed consent was obtained from the patient following explanation of the procedure, risks, benefits and alternatives. The patient understands, agrees and consents for the procedure. All questions were addressed. A time out was performed. Maximal barrier sterile technique utilized including caps, mask, sterile gowns, sterile gloves, large sterile drape, hand hygiene, and betadine prep. The left upper quadrant was sterilely prepped and draped. An oral gastric catheter was inserted into the stomach under fluoroscopy. The existing nasogastric feeding tube was removed. Air was injected into the stomach for insufflation and visualization under fluoroscopy. The air distended stomach was confirmed beneath the anterior abdominal wall in the frontal and lateral projections. Under sterile conditions and local anesthesia, a 32 gauge trocar needle was utilized to access the stomach percutaneously beneath the left subcostal margin. Needle position was confirmed within the stomach under biplane fluoroscopy. Contrast injection confirmed position also. A single T tack was deployed for gastropexy. Over an Amplatz guide wire, a 9-French sheath was inserted into the stomach. A snare device was utilized to capture the oral gastric catheter. The snare device was pulled retrograde from the stomach up the esophagus and out the oropharynx. The 20-French pull-through gastrostomy was connected to the snare device and pulled antegrade through the oropharynx down the esophagus into the stomach and then through the percutaneous tract external to the patient. The gastrostomy was assembled externally. Contrast injection confirms position in the stomach. Images  were obtained for documentation. The patient tolerated procedure well. No immediate complication. IMPRESSION: Fluoroscopic insertion of a 20-French "pull-through" gastrostomy. Electronically Signed   By: Jerilynn Mages.  Shick M.D.   On: 11/18/2015 16:40   Nm Pet Image Initial (pi) Skull Base To Thigh  11/10/2015  CLINICAL DATA:  Initial treatment strategy for right tonsillar squamous cell carcinoma diagnosed 10/25/2015. EXAM: NUCLEAR MEDICINE PET SKULL BASE TO THIGH TECHNIQUE: 5.7 mCi F-18 FDG was injected intravenously. Full-ring PET imaging was performed from the skull base to thigh after the radiotracer. CT data was obtained and used for attenuation correction and anatomic localization. FASTING BLOOD GLUCOSE:  Value: 110 mg/dl COMPARISON:  10/05/2015 neck CT. FINDINGS: NECK There is symmetric mild enlargement of the palatine tonsils bilaterally. There is asymmetric hypermetabolism in the right palatine tonsil with max SUV 9.1. There is hypermetabolism in the left palatine tonsil with max SUV 7.6. Mildly enlarged hypermetabolic 1.0 cm right level 2 lymph node near the angle of the mandible with max SUV 5.0 (series 4/image 21). Mildly enlarged hypermetabolic 1.0 cm left level 2 lymph node near the angle of the mandible with max SUV 4.4 (series 4/image 23). No additional hypermetabolic lymph nodes in the neck. CHEST There is a 0.7 cm left upper lobe pulmonary nodule (series 6/image 23) with associated mild metabolism with max SUV 1.9 (which is considered significant for a nodule of this small size). At least 3 additional smaller pulmonary nodules in the right upper lobe and left lower lobe are below PET resolution. No acute consolidative airspace disease. No hypermetabolic axillary, mediastinal or hilar nodes. Left anterior descending, left circumflex and right coronary atherosclerosis. Atherosclerotic nonaneurysmal thoracic aorta. Mild centrilobular and paraseptal emphysema with mild diffuse bronchial wall thickening.  ABDOMEN/PELVIS There is a lobulated solid homogeneous 4.9 x 2.5 cm hypermetabolic exophytic anterior proximal gastric mass with max SUV 24.8 (series 4/image 99). Stomach is collapsed and otherwise grossly normal. No abnormal hypermetabolic  activity within the liver, pancreas, adrenal glands, or spleen. No hypermetabolic lymph nodes in the abdomen or pelvis. Atherosclerotic nonaneurysmal abdominal aorta. SKELETON No focal hypermetabolic activity to suggest skeletal metastasis. IMPRESSION: 1. Bilateral palatine tonsil enlargement and hypermetabolism, with asymmetric hypermetabolism in the right palatine tonsil, consistent with primary malignancy in this location. Malignant involvement of the contralateral (left) palatine tonsil cannot be excluded. 2. Bilateral mildly enlarged hypermetabolic level 2 neck lymph nodes, suspicious for nodal metastases. 3. Four subcentimeter pulmonary nodules scattered in both lungs, the largest of which demonstrates mild metabolism (max SUV 1.9), which is considered significant for a nodule of this small size, and raises concern for pulmonary metastases. 4. Solid lobulated hypermetabolic 4.9 x 2.5 cm gastric mass arising exophytically from the anterior proximal stomach, most suggestive of a gastric GIST. Tissue sampling is advised for this mass, probably best obtained via upper endoscopic approach. 5. Three-vessel coronary atherosclerosis.  Aortic atherosclerosis. 6. Mild centrilobular and paraseptal emphysema with mild diffuse bronchial wall thickening, suggesting COPD. Electronically Signed   By: Ilona Sorrel M.D.   On: 11/10/2015 15:10   Ir Fluoro Guide Cv Line Right  11/18/2015  CLINICAL DATA:  Metastatic tonsillar squamous cell carcinoma. Access for chemotherapy EXAM: RIGHT INTERNAL JUGULAR SINGLE LUMEN POWER PORT CATHETER INSERTION Date:  7/13/20177/13/2017 3:44 pm Radiologist:  M. Daryll Brod, MD Guidance:  Ultrasound and fluoroscopic MEDICATIONS: 2 g and Theresia Lo; The antibiotic  was administered within an appropriate time interval prior to skin puncture. ANESTHESIA/SEDATION: Versed 4 mg IV; Fentanyl 100 mcg IV; Moderate Sedation Time:  26 minutes The patient was continuously monitored during the procedure by the interventional radiology nurse under my direct supervision. FLUOROSCOPY TIME:  30 seconds (2 mGy) COMPLICATIONS: None immediate. CONTRAST:  None. PROCEDURE: Informed consent was obtained from the patient following explanation of the procedure, risks, benefits and alternatives. The patient understands, agrees and consents for the procedure. All questions were addressed. A time out was performed. Maximal barrier sterile technique utilized including caps, mask, sterile gowns, sterile gloves, large sterile drape, hand hygiene, and 2% chlorhexidine scrub. Under sterile conditions and local anesthesia, right internal jugular micropuncture venous access was performed. Access was performed with ultrasound. Images were obtained for documentation. A guide wire was inserted followed by a transitional dilator. This allowed insertion of a guide wire and catheter into the IVC. Measurements were obtained from the SVC / RA junction back to the right IJ venotomy site. In the right infraclavicular chest, a subcutaneous pocket was created over the second anterior rib. This was done under sterile conditions and local anesthesia. 1% lidocaine with epinephrine was utilized for this. A 2.5 cm incision was made in the skin. Blunt dissection was performed to create a subcutaneous pocket over the right pectoralis major muscle. The pocket was flushed with saline vigorously. There was adequate hemostasis. The port catheter was assembled and checked for leakage. The port catheter was secured in the pocket with two retention sutures. The tubing was tunneled subcutaneously to the right venotomy site and inserted into the SVC/RA junction through a valved peel-away sheath. Position was confirmed with fluoroscopy.  Images were obtained for documentation. The patient tolerated the procedure well. No immediate complications. Incisions were closed in a two layer fashion with 4 - 0 Vicryl suture. Dermabond was applied to the skin. The port catheter was accessed, blood was aspirated followed by saline and heparin flushes. Needle was removed. A dry sterile dressing was applied. IMPRESSION: Ultrasound and fluoroscopically guided right internal jugular single lumen power  port catheter insertion. Tip in the SVC/RA junction. Catheter ready for use. Electronically Signed   By: Jerilynn Mages.  Shick M.D.   On: 11/18/2015 16:29   Ir US Guide Vasc Access Right  11/18/2015  CLINICAL DATA:  Metastatic tonsillar squamous cell carcinoma. Access for chemotherapy EXAM: RIGHT INTERNAL JUGULAR SINGLE LUMEN POWER PORT CATHETER INSERTION Date:  7/13/20177/13/2017 3:44 pm Radiologist:  M. Daryll Brod, MD Guidance:  Ultrasound and fluoroscopic MEDICATIONS: 2 g and Theresia Lo; The antibiotic was administered within an appropriate time interval prior to skin puncture. ANESTHESIA/SEDATION: Versed 4 mg IV; Fentanyl 100 mcg IV; Moderate Sedation Time:  26 minutes The patient was continuously monitored during the procedure by the interventional radiology nurse under my direct supervision. FLUOROSCOPY TIME:  30 seconds (2 mGy) COMPLICATIONS: None immediate. CONTRAST:  None. PROCEDURE: Informed consent was obtained from the patient following explanation of the procedure, risks, benefits and alternatives. The patient understands, agrees and consents for the procedure. All questions were addressed. A time out was performed. Maximal barrier sterile technique utilized including caps, mask, sterile gowns, sterile gloves, large sterile drape, hand hygiene, and 2% chlorhexidine scrub. Under sterile conditions and local anesthesia, right internal jugular micropuncture venous access was performed. Access was performed with ultrasound. Images were obtained for documentation. A guide  wire was inserted followed by a transitional dilator. This allowed insertion of a guide wire and catheter into the IVC. Measurements were obtained from the SVC / RA junction back to the right IJ venotomy site. In the right infraclavicular chest, a subcutaneous pocket was created over the second anterior rib. This was done under sterile conditions and local anesthesia. 1% lidocaine with epinephrine was utilized for this. A 2.5 cm incision was made in the skin. Blunt dissection was performed to create a subcutaneous pocket over the right pectoralis major muscle. The pocket was flushed with saline vigorously. There was adequate hemostasis. The port catheter was assembled and checked for leakage. The port catheter was secured in the pocket with two retention sutures. The tubing was tunneled subcutaneously to the right venotomy site and inserted into the SVC/RA junction through a valved peel-away sheath. Position was confirmed with fluoroscopy. Images were obtained for documentation. The patient tolerated the procedure well. No immediate complications. Incisions were closed in a two layer fashion with 4 - 0 Vicryl suture. Dermabond was applied to the skin. The port catheter was accessed, blood was aspirated followed by saline and heparin flushes. Needle was removed. A dry sterile dressing was applied. IMPRESSION: Ultrasound and fluoroscopically guided right internal jugular single lumen power port catheter insertion. Tip in the SVC/RA junction. Catheter ready for use. Electronically Signed   By: Jerilynn Mages.  Shick M.D.   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 Recent diagnosis of tonsillar cancer, treatment pending. Plans for chemotherapy and radiation. Patient had gastrostomy tube placed by interventional radiology last week. She complains of intermittent abdominal cramping. She's had some constipation since starting pain  medication last week. She is overdue for surveillance colonoscopy for history of adenomatous colon polyps, last colonoscopy 2004. She no showed for her colonoscopy we set up in 2015.  Discussed with patient and her daughter. She needs an EGD with biopsy, we have scheduled her for tomorrow. Plan on deep sedation in the OR due to her history of alcohol abuse. She has a history of cocaine abuse in the past.  I have discussed the risks, alternatives, benefits with regards to  but not limited to the risk of reaction to medication, bleeding, infection, perforation and the patient is agreeable to proceed. Written consent to be obtained.  Will manage her constipation with Miralax 1-2 doses daily, can transition to administering through tube if needed in the future. They know to call if ineffective.   No plans for colonoscopy at this time given tonsillar cancer, and PET of abdomen was ok. Will plan for colonoscopy at later date.

## 2015-11-22 NOTE — Progress Notes (Signed)
Quick Note:  LMOM to call. ______ 

## 2015-11-22 NOTE — Patient Instructions (Signed)
Molly Campbell  11/22/2015     @PREFPERIOPPHARMACY @   Your procedure is scheduled on 11/23/2015.  Report to [facility] at 11:15 A.M.  Call this number if you have problems the morning of surgery:  [phone]   Remember:  Do not eat food or drink liquids after midnight.  Take these medicines the morning of surgery with A SIP OF WATER : Norvasc and Vicodin   Do not wear jewelry, make-up or nail polish.  Do not wear lotions, powders, or perfumes.  You may wear deoderant.  Do not shave 48 hours prior to surgery.  Men may shave face and neck.  Do not bring valuables to the hospital.  [facility] is not responsible for any belongings or valuables.  Contacts, dentures or bridgework may not be worn into surgery.  Leave your suitcase in the car.  After surgery it may be brought to your room.  For patients admitted to the hospital, discharge time will be determined by your treatment team.  Patients discharged the day of surgery will not be allowed to drive home.   Name and phone number of your driver:   [email or web address] Special instructions:  N/a   Please read over the following fact sheets that you were given. Care and Recovery After Surgery    Esophagogastroduodenoscopy Esophagogastroduodenoscopy (EGD) is a procedure that is used to examine the lining of the esophagus, stomach, and first part of the small intestine (duodenum). A long, flexible, lighted tube with a camera attached (endoscope) is inserted down the throat to view these organs. This procedure is done to detect problems or abnormalities, such as inflammation, bleeding, ulcers, or growths, in order to treat them. The procedure lasts 5-20 minutes. It is usually an outpatient procedure, but it may need to be performed in a hospital in emergency cases. LET [facility] CARE PROVIDER KNOW ABOUT:  Any allergies you have.  All medicines you are taking, including vitamins, herbs, eye drops, creams, and over-the-counter  medicines.  Previous problems you or members of your family have had with the use of anesthetics.  Any blood disorders you have.  Previous surgeries you have had.  Medical conditions you have. RISKS AND COMPLICATIONS Generally, this is a safe procedure. However, problems can occur and include:  Infection.  Bleeding.  Tearing (perforation) of the esophagus, stomach, or duodenum.  Difficulty breathing or not being able to breathe.  Excessive sweating.  Spasms of the larynx.  Slowed heartbeat.  Low blood pressure. BEFORE THE PROCEDURE  Do not eat or drink anything after midnight on the night before the procedure or as directed by your health care provider.  Do not take your regular medicines before the procedure if your health care provider asks you not to. Ask your health care provider about changing or stopping those medicines.  If you wear dentures, be prepared to remove them before the procedure.  Arrange for someone to drive you home after the procedure. PROCEDURE  A numbing medicine (local anesthetic) may be sprayed in your throat for comfort and to stop you from gagging or coughing.  You will have an IV tube inserted in a vein in your hand or arm. You will receive medicines and fluids through this tube.  You will be given a medicine to relax you (sedative).  A pain reliever will be given through the IV tube.  A mouth guard may be placed in your mouth to protect your teeth and to keep you from biting on the endoscope.  You will be asked to lie on your left side.  The endoscope will be inserted down your throat and into your esophagus, stomach, and duodenum.  Air will be put through the endoscope to allow your health care provider to clearly view the lining of your esophagus.  The lining of your esophagus, stomach, and duodenum will be examined. During the exam, your health care provider may:  Remove tissue to be examined under a microscope (biopsy) for  inflammation, infection, or other medical problems.  Remove growths.  Remove objects (foreign bodies) that are stuck.  Treat any bleeding with medicines or other devices that stop tissues from bleeding (hot cautery, clipping devices).  Widen (dilate) or stretch narrowed areas of your esophagus and stomach.  The endoscope will be withdrawn. AFTER THE PROCEDURE  You will be taken to a recovery area for observation. Your blood pressure, heart rate, breathing rate, and blood oxygen level will be monitored often until the medicines you were given have worn off.  Do not eat or drink anything until the numbing medicine has worn off and your gag reflex has returned. You may choke.  Your health care provider should be able to discuss his or her findings with you. It will take longer to discuss the test results if any biopsies were taken.   This information is not intended to replace advice given to you by your health care provider. Make sure you discuss any questions you have with your health care provider.   Document Released: 08/25/2004 Document Revised: 05/15/2014 Document Reviewed: 03/27/2012 Elsevier Interactive Patient Education [employer].

## 2015-11-23 ENCOUNTER — Other Ambulatory Visit (HOSPITAL_COMMUNITY): Payer: Self-pay | Admitting: Emergency Medicine

## 2015-11-23 ENCOUNTER — Ambulatory Visit (HOSPITAL_COMMUNITY): Payer: Medicaid Other | Admitting: Anesthesiology

## 2015-11-23 ENCOUNTER — Ambulatory Visit (HOSPITAL_COMMUNITY)
Admission: RE | Admit: 2015-11-23 | Discharge: 2015-11-23 | Disposition: A | Discharge status: Home / Self Care | Payer: Medicaid Other | Source: Ambulatory Visit | Attending: Gastroenterology | Admitting: Gastroenterology

## 2015-11-23 ENCOUNTER — Encounter (HOSPITAL_COMMUNITY)
Admission: RE | Disposition: A | Discharge status: Home / Self Care | Payer: Self-pay | Source: Ambulatory Visit | Attending: Gastroenterology

## 2015-11-23 ENCOUNTER — Other Ambulatory Visit: Payer: Self-pay

## 2015-11-23 ENCOUNTER — Encounter (HOSPITAL_COMMUNITY): Payer: Self-pay | Admitting: *Deleted

## 2015-11-23 DIAGNOSIS — Z8541 Personal history of malignant neoplasm of cervix uteri: Secondary | ICD-10-CM | POA: Diagnosis not present

## 2015-11-23 DIAGNOSIS — K219 Gastro-esophageal reflux disease without esophagitis: Secondary | ICD-10-CM | POA: Insufficient documentation

## 2015-11-23 DIAGNOSIS — J439 Emphysema, unspecified: Secondary | ICD-10-CM | POA: Insufficient documentation

## 2015-11-23 DIAGNOSIS — C099 Malignant neoplasm of tonsil, unspecified: Secondary | ICD-10-CM | POA: Insufficient documentation

## 2015-11-23 DIAGNOSIS — I10 Essential (primary) hypertension: Secondary | ICD-10-CM | POA: Diagnosis not present

## 2015-11-23 DIAGNOSIS — R933 Abnormal findings on diagnostic imaging of other parts of digestive tract: Secondary | ICD-10-CM

## 2015-11-23 DIAGNOSIS — F17210 Nicotine dependence, cigarettes, uncomplicated: Secondary | ICD-10-CM | POA: Insufficient documentation

## 2015-11-23 DIAGNOSIS — Z85818 Personal history of malignant neoplasm of other sites of lip, oral cavity, and pharynx: Secondary | ICD-10-CM | POA: Insufficient documentation

## 2015-11-23 DIAGNOSIS — K2970 Gastritis, unspecified, without bleeding: Secondary | ICD-10-CM | POA: Diagnosis not present

## 2015-11-23 DIAGNOSIS — F419 Anxiety disorder, unspecified: Secondary | ICD-10-CM | POA: Insufficient documentation

## 2015-11-23 HISTORY — PX: ESOPHAGOGASTRODUODENOSCOPY (EGD) WITH PROPOFOL: SHX5813

## 2015-11-23 SURGERY — ESOPHAGOGASTRODUODENOSCOPY (EGD) WITH PROPOFOL
Anesthesia: Monitor Anesthesia Care

## 2015-11-23 MED ORDER — ONDANSETRON HCL 4 MG/2ML IJ SOLN
INTRAMUSCULAR | Status: AC
  Filled 2015-11-23: qty 2

## 2015-11-23 MED ORDER — OMEPRAZOLE 20 MG PO CPDR: DELAYED_RELEASE_CAPSULE | ORAL | Status: DC

## 2015-11-23 MED ORDER — CHLORHEXIDINE GLUCONATE CLOTH 2 % EX PADS: 6.0000 | MEDICATED_PAD | Freq: Once | CUTANEOUS | Status: DC

## 2015-11-23 MED ORDER — FENTANYL CITRATE (PF) 100 MCG/2ML IJ SOLN: 25.0000 ug | INTRAMUSCULAR | Status: DC | PRN

## 2015-11-23 MED ORDER — ONDANSETRON HCL 4 MG/2ML IJ SOLN: 4.0000 mg | Freq: Once | INTRAMUSCULAR | Status: DC | PRN

## 2015-11-23 MED ORDER — ONDANSETRON HCL 4 MG/2ML IJ SOLN
4.0000 mg | Freq: Once | INTRAMUSCULAR | Status: AC
  Administered 2015-11-23: 4 mg via INTRAVENOUS

## 2015-11-23 MED ORDER — FENTANYL CITRATE (PF) 100 MCG/2ML IJ SOLN
INTRAMUSCULAR | Status: AC
  Filled 2015-11-23: qty 2

## 2015-11-23 MED ORDER — LIDOCAINE VISCOUS 2 % MT SOLN
OROMUCOSAL | Status: AC
  Filled 2015-11-23: qty 15

## 2015-11-23 MED ORDER — FENTANYL CITRATE (PF) 100 MCG/2ML IJ SOLN
25.0000 ug | INTRAMUSCULAR | Status: AC | PRN
  Administered 2015-11-23 (×2): 25 ug via INTRAVENOUS

## 2015-11-23 MED ORDER — LACTATED RINGERS IV SOLN
INTRAVENOUS | Status: DC
  Administered 2015-11-23: 12:00:00 via INTRAVENOUS

## 2015-11-23 MED ORDER — MIDAZOLAM HCL 5 MG/5ML IJ SOLN
INTRAMUSCULAR | Status: DC | PRN
  Administered 2015-11-23: 2 mg via INTRAVENOUS

## 2015-11-23 MED ORDER — MIDAZOLAM HCL 2 MG/2ML IJ SOLN
INTRAMUSCULAR | Status: AC
  Filled 2015-11-23: qty 2

## 2015-11-23 MED ORDER — LIDOCAINE VISCOUS 2 % MT SOLN
4.0000 mL | Freq: Once | OROMUCOSAL | Status: AC
  Administered 2015-11-23: 4 mL via OROMUCOSAL

## 2015-11-23 MED ORDER — MIDAZOLAM HCL 2 MG/2ML IJ SOLN
1.0000 mg | INTRAMUSCULAR | Status: DC | PRN
Start: 2015-11-23 — End: 2015-11-23
  Administered 2015-11-23: 2 mg via INTRAVENOUS
  Filled 2015-11-23: qty 2

## 2015-11-23 MED ORDER — PROPOFOL 500 MG/50ML IV EMUL
INTRAVENOUS | Status: DC | PRN
  Administered 2015-11-23: 75 ug/kg/min via INTRAVENOUS

## 2015-11-23 NOTE — Discharge Instructions (Signed)
You have mild gastritis.    I PERSONALLY REVIEWED YOUR PET SCAN WITH RADIOLOGY. I SPOKE WITH DR. Whitney Muse. WE ALL AGREE IT IS SAFE TO PROCEED WITH CT GUIDED BIOPSY.    START OMEPRAZOLE ONCE DAILY WITH BREAKFAST FOR THE NEXT YEAR.  AVOID TRIGGERS FOR GASTRITIS. SEE INFO BELOW.  FOLLOW UP WITH DR. Hasan Douse IF NEEDED.  UPPER ENDOSCOPY AFTER CARE Read the instructions outlined below and refer to this sheet in the next week. These discharge instructions provide you with general information on caring for yourself after you leave the hospital. While your treatment has been planned according to the most current medical practices available, unavoidable complications occasionally occur. If you have any problems or questions after discharge, call DR. Syra Sirmons, [phone].  ACTIVITY  You may resume your regular activity, but move at a slower pace for the next 24 hours.   Take frequent rest periods for the next 24 hours.   Walking will help get rid of the air and reduce the bloated feeling in your belly (abdomen).   No driving for 24 hours (because of the medicine (anesthesia) used during the test).   You may shower.   Do not sign any important legal documents or operate any machinery for 24 hours (because of the anesthesia used during the test).    NUTRITION  Drink plenty of fluids.   You may resume your normal diet as instructed by your doctor.   Begin with a light meal and progress to your normal diet. Heavy or fried foods are harder to digest and may make you feel sick to your stomach (nauseated).   Avoid alcoholic beverages for 24 hours or as instructed.    MEDICATIONS  You may resume your normal medications.   WHAT YOU CAN EXPECT TODAY  Some feelings of bloating in the abdomen.   Passage of more gas than usual.    IF YOU HAD A BIOPSY TAKEN DURING THE UPPER ENDOSCOPY:  Eat a soft diet IF YOU HAVE NAUSEA, BLOATING, ABDOMINAL PAIN, OR VOMITING.    FINDING OUT THE RESULTS  OF YOUR TEST Not all test results are available during your visit. DR. Oneida Alar WILL CALL YOU WITHIN 7 DAYS OF YOUR PROCEDUE WITH YOUR RESULTS. Do not assume everything is normal if you have not heard from DR. Dwayne Begay IN ONE WEEK, CALL HER OFFICE AT [phone].  SEEK IMMEDIATE MEDICAL ATTENTION AND CALL THE OFFICE: [phone] IF:  You have more than a spotting of blood in your stool.   Your belly is swollen (abdominal distention).   You are nauseated or vomiting.   You have a temperature over 101F.   You have abdominal pain or discomfort that is severe or gets worse throughout the day.   Gastritis  Gastritis is an inflammation (the body's way of reacting to injury and/or infection) of the stomach. It is often caused by viral or bacterial (germ) infections. It can also be caused BY ASPIRIN, BC/GOODY POWDER'S, (IBUPROFEN) MOTRIN, OR ALEVE (NAPROXEN), chemicals (including alcohol), SPICY FOODS, and medications. This illness may be associated with generalized malaise (feeling tired, not well), UPPER ABDOMINAL STOMACH cramps, and fever.

## 2015-11-23 NOTE — Transfer of Care (Signed)
Immediate Anesthesia Transfer of Care Note  Patient: Molly Campbell  Procedure(s) Performed: Procedure(s) with comments: ESOPHAGOGASTRODUODENOSCOPY (EGD) WITH PROPOFOL (N/A) - 1245  Patient Location: PACU  Anesthesia Type:MAC  Level of Consciousness: awake and patient cooperative  Airway & Oxygen Therapy: Patient Spontanous Breathing and Patient connected to face mask oxygen  Post-op Assessment: Report given to RN, Post -op Vital signs reviewed and stable and Patient moving all extremities  Post vital signs: Reviewed and stable  Last Vitals:  Filed Vitals:   11/23/15 1150  BP: 130/86  Pulse: 93  Temp: 36.4 C  Resp: 20    Last Pain: There were no vitals filed for this visit.       Complications: No apparent anesthesia complications

## 2015-11-23 NOTE — Anesthesia Preprocedure Evaluation (Signed)
Anesthesia Evaluation  Patient identified by MRN, date of birth, ID band Patient awake    Reviewed: Allergy & Precautions, NPO status , Patient's Chart, lab work & pertinent test results  Airway Mallampati: I  TM Distance: >3 FB Neck ROM: Full    Dental  (+) Partial Upper, Poor Dentition, Missing   Pulmonary COPD (emphysema), Current Smoker,    Pulmonary exam normal        Cardiovascular hypertension, Pt. on medications Normal cardiovascular exam+ dysrhythmias Atrial Fibrillation      Neuro/Psych  Headaches, PSYCHIATRIC DISORDERS Depression    GI/Hepatic GERD  Medicated and Controlled,(+)     substance abuse  alcohol use and cocaine use, Hepatitis -  Endo/Other    Renal/GU      Musculoskeletal   Abdominal   Peds  Hematology   Anesthesia Other Findings   Reproductive/Obstetrics                             Anesthesia Physical Anesthesia Plan  ASA: III  Anesthesia Plan: MAC   Post-op Pain Management:    Induction: Intravenous  Airway Management Planned: Simple Face Mask  Additional Equipment:   Intra-op Plan:   Post-operative Plan:   Informed Consent: I have reviewed the patients History and Physical, chart, labs and discussed the procedure including the risks, benefits and alternatives for the proposed anesthesia with the patient or authorized representative who has indicated his/her understanding and acceptance.     Plan Discussed with:   Anesthesia Plan Comments:         Anesthesia Quick Evaluation

## 2015-11-23 NOTE — Progress Notes (Signed)
Spoke with pts daughter [name] and notified her that  Cardiology had a cancellation and they could see her Thursday 11/25/2015 at 2:40pm.  Verbalized understanding.

## 2015-11-23 NOTE — Progress Notes (Signed)
1124 Dental extraction cancelled due to T wave changes.  Urgent cardiac evaluation needed.  Cardiology consulted.  Appt made for 11/29/2015 at 3:20 with Dr Harl Bowie and that's the earliest they could see her.  Pt notified and verbalized understanding.

## 2015-11-23 NOTE — Anesthesia Postprocedure Evaluation (Signed)
Anesthesia Post Note  Patient: Molly Campbell  Procedure(s) Performed: Procedure(s) (LRB): ESOPHAGOGASTRODUODENOSCOPY (EGD) WITH PROPOFOL (N/A)  Patient location during evaluation: PACU Anesthesia Type: MAC Level of consciousness: awake, oriented and patient cooperative Pain management: pain level controlled Vital Signs Assessment: post-procedure vital signs reviewed and stable Respiratory status: spontaneous breathing, nonlabored ventilation and respiratory function stable Cardiovascular status: blood pressure returned to baseline and stable Postop Assessment: no signs of nausea or vomiting Anesthetic complications: no    Last Vitals:  Filed Vitals:   11/23/15 1150  BP: 130/86  Pulse: 93  Temp: 36.4 C  Resp: 20    Last Pain: There were no vitals filed for this visit.               Shiasia Porro J

## 2015-11-23 NOTE — H&P (Signed)
Primary Care Physician:  Rosita Fire, MD Primary Gastroenterologist:  Dr. Oneida Alar  Pre-Procedure History & Physical: HPI:  Molly Campbell is a 55 y.o. female here for Abnormal PET scan-MASS ON STOMACH.  Past Medical History  Diagnosis Date  . Hypertension   . Tobacco abuse   . Alcohol abuse   . Cocaine abuse   . Alcoholic hepatitis [id number]  . Auditory hallucinations   . Anxiety disorder   . Schizophrenia ([place])   . COPD (chronic obstructive pulmonary disease) ([place])   . Emphysema   . Chronic bronchitis with acute exacerbation ([place])   . History of cervical cancer 09/26/2013  . GERD (gastroesophageal reflux disease)   . Pneumonia     hx  . Cancer (HCC)     cervical  . Scoliosis   . Chronic back pain   . Malignant neoplasm of tonsillar fossa ([place]) 11/12/2015  . Dysrhythmia 2012    hx AF in er-cocaine-converted back NSR  . Depression   . Headache     Past Surgical History  Procedure Laterality Date  . Abdominal hysterectomy    . Colonoscopy  05/07/2002    Dr.Demason- large approximately 1.5cm polyp pedunculated aproximately 10cm from the anal verge bx= adenomatous polyp  . Esophagogastroduodenoscopy  06/02/2002    Dr. Anthony Sar- mild distal esophagitis w/o stricturing or ulceration. stomach and pylorus are normal. inflammatory changes of proximal duldenum in the first portion.   . Fracture surgery  right wrist    [facility]  . Breast biopsy  2005    lt bx  . Direct laryngoscopy N/A 10/25/2015    Procedure: DIRECT LARYNGOSCOPY WITH BIOPSY OF ORAL PHARYNGEAL LESION;  Surgeon: Leta Baptist, MD;  Location: [facility];  Service: ENT;  Laterality: N/A;  DIRECT LARYNGOSCOPY WITH BIOPSY OF ORAL PHARYNGEAL LESION  . Portacath placement Right 11/18/15  . Peg placement  11/18/15    Prior to Admission medications   Medication Sig Start Date End Date Taking? Authorizing Provider  albuterol (PROVENTIL HFA;VENTOLIN HFA) 108 (90 BASE) MCG/ACT inhaler Inhale 1-2 puffs into the  lungs every 6 (six) hours as needed for wheezing or shortness of breath. 08/18/14  Yes Ila, NP  amLODipine (NORVASC) 5 MG tablet Take 5 mg by mouth daily.   Yes Historical Provider, MD  HYDROcodone-acetaminophen (NORCO/VICODIN) 5-325 MG tablet Take 1 tablet by mouth every 4 (four) hours as needed for moderate pain. 11/19/15  Yes Patrici Ranks, MD  lidocaine (XYLOCAINE) 2 % solution Mix 1 part 2%viscous lidocaine,1part H2O.Swish and swallow 31mL of this mixture,39min before meals and at bedtime, up to QID Patient taking differently: Use as directed 5 mLs in the mouth or throat See admin instructions. Mix 1 part 2% viscous lidocaine with 1part water.Swish and swallow 78mL of this mixture,83min before meals and at bedtime, up to QID 11/12/15  Yes Eppie Gibson, MD  Multiple Vitamins-Minerals (MULTIVITAMIN) tablet Take 1 tablet by mouth daily. 11/12/15  Yes Eppie Gibson, MD  nicotine (NICODERM CQ - DOSED IN MG/24 HOURS) 14 mg/24hr patch Place 1 patch (14 mg total) onto the skin daily. Apply 21 mg patch daily x 6 wk, then 14mg  patch daily x 2 wk, then 7 mg patch daily x 2 wk 11/12/15  Yes Eppie Gibson, MD  oxyCODONE-acetaminophen (ROXICET) 5-325 MG tablet Take 1 tablet by mouth every 4 (four) hours as needed. 10/25/15  Yes Leta Baptist, MD  polyethylene glycol powder (GLYCOLAX/MIRALAX) powder Take one capsule and mix in 4-6 ounces of water, take  one to two times daily as needed for constipation. Patient taking differently: Take 17 g by mouth 2 (two) times daily as needed (constipation). Take one capful and mix in 4-6 ounces of water, take one to two times daily as needed for constipation. 11/22/15  Yes Mahala Menghini, PA-C  thiamine (VITAMIN B-1) 100 MG tablet Take 1 tablet (100 mg total) by mouth daily. 11/12/15  Yes Eppie Gibson, MD  nicotine (NICODERM CQ - DOSED IN MG/24 HOURS) 21 mg/24hr patch Place 1 patch (21 mg total) onto the skin daily. Apply 21 mg patch daily x 6 wk, then 14mg  patch daily x 2 wk, then 7  mg patch daily x 2 wk Patient not taking: Reported on 11/17/2015 11/12/15   Eppie Gibson, MD  nicotine (NICODERM CQ - DOSED IN MG/24 HR) 7 mg/24hr patch Place 1 patch (7 mg total) onto the skin daily. Apply 21 mg patch daily x 6 wk, then 14mg  patch daily x 2 wk, then 7 mg patch daily x 2 wk Patient not taking: Reported on 11/17/2015 11/12/15   Eppie Gibson, MD  Nutritional Supplements (FEEDING SUPPLEMENT, OSMOLITE 1.5 CAL,) LIQD 5 cans via PEG, split into 3 meals of 1.5, 1.5 and 2 cans. Flush with 150 cc water before and after each feed Patient taking differently: by PEG Tube route See admin instructions. 5 cans via PEG, split into 3 meals of 1.5, 1.5 and 2 cans. Flush with 150 cc water before and after each feed 11/19/15   Patrici Ranks, MD    Allergies as of 11/22/2015 - Review Complete 11/22/2015  Allergen Reaction Noted  . Benicar [olmesartan] Other (See Comments) 11/04/2015  . Ibuprofen Palpitations 07/12/2014    Family History  Problem Relation Age of Onset  . Heart failure Other   . Alzheimer's disease Mother   . Other Father     "he was beat to death"  . Heart disease Sister   . Gout Sister   . COPD Sister   . Seizures Sister   . Diabetes Sister   . Diabetes Brother   . Cancer Paternal Grandmother     not sure what kind  . Seizures Sister   . Other Sister     "bones are messed up"  . Hypertension Brother   . Heart attack Brother   . Other Brother     MVA  . Diabetes Brother   . Other Brother     MVA  . Alcohol abuse Daughter   . Other Son     motorcycle accident    Social History   Social History  . Marital Status: Widowed    Spouse Name: N/A  . Number of Children: 3  . Years of Education: N/A   Occupational History  . Not on file.   Social History Main Topics  . Smoking status: Current Every Day Smoker -- 1.00 packs/day for 48 years    Types: Cigarettes  . Smokeless tobacco: Former Systems developer    Types: Snuff     Comment: One pack a day  . Alcohol Use:  Yes     Comment: 40 oz a day  . Drug Use: Yes    Special: Cocaine, Marijuana     Comment: Last cocaine used 2 weeks  . Sexual Activity: Not Currently    Birth Control/ Protection: Surgical   Other Topics Concern  . Not on file   Social History Narrative    Review of Systems: See HPI, otherwise negative ROS  Physical Exam: BP 130/86 mmHg  Pulse 93  Temp(Src) 97.6 F (36.4 C) (Oral)  Resp 20  Ht 5\' 2"  (1.575 m)  Wt 107 lb (48.535 kg)  BMI 19.57 kg/m2  SpO2 93% General:   Alert,  pleasant and cooperative in NAD Head:  Normocephalic and atraumatic. Neck:  Supple; Lungs:  Clear throughout to auscultation.    Heart:  Regular rate and rhythm. Abdomen:  Soft, nontender and nondistended. Normal bowel sounds, without guarding, and without rebound.   Neurologic:  Alert and  oriented x4;  grossly normal neurologically.  Impression/Plan:     Abnormal PET scan-MASS ON STOMACH  Plan:  EGD TODAY

## 2015-11-24 ENCOUNTER — Ambulatory Visit (HOSPITAL_COMMUNITY): Admission: RE | Admit: 2015-11-24 | Payer: Medicaid Other | Source: Ambulatory Visit | Admitting: Dentistry

## 2015-11-24 ENCOUNTER — Ambulatory Visit (HOSPITAL_COMMUNITY): Payer: Medicaid Other | Attending: Hematology & Oncology | Admitting: Speech Pathology

## 2015-11-24 NOTE — Op Note (Signed)
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 Tina Griffiths,                            Technician Referring MD:             Ancil Linsey MD Medicines:                Propofol per Anesthesia Complications:            No immediate complications. Estimated Blood Loss:     Estimated blood loss: none. Procedure:                Pre-Anesthesia Assessment:                           - Prior to the procedure, a History and Physical                            was performed, and patient medications and                            allergies were reviewed. The patient's tolerance of                            previous anesthesia was also reviewed. The risks                            and benefits of the procedure and the sedation                            options and risks were discussed with the patient.                            All questions were answered, and informed consent                            was obtained. Prior Anticoagulants: The patient has                            taken no previous anticoagulant or antiplatelet                            agents. ASA Grade Assessment: II - A patient with  mild systemic disease. After reviewing the risks                            and benefits, the patient was deemed in                            satisfactory condition to undergo the procedure.                           After obtaining informed consent, the endoscope was                            passed under direct vision. Throughout the   procedure, the patient's blood pressure, pulse, and                            oxygen saturations were monitored continuously. The                            [id number]) scope was introduced through the                            mouth, and advanced to the second part of duodenum.                            The upper GI endoscopy was accomplished without                            difficulty. The patient tolerated the procedure                            well. Scope In: 1:30:12 PM Scope Out: 1:34:34 PM Total Procedure Duration: 0 hours 4 minutes 22 seconds  Findings:      The examined esophagus was normal.      Scattered mild inflammation characterized by congestion (edema) and       erythema was found in the stomach.      The examined duodenum was normal. Impression:               - Normal esophagus.                           - Gastritis.                           - Normal examined duodenum.                           - No specimens collected. Moderate Sedation:      Per Anesthesia Care Recommendation:           - Resume previous diet.                           - Continue present medications.                           I PERSONALLY  REVIEWED PET SCAN WITH DR. TALBOT.                            SPOKE WITH DR. Whitney Muse. WE ALL AGREE IT IS SAFE TO                            PROCEED WITH CT GUIDED BIOPSY.                           START OMEPRAZOLE ONCE DAILY WITH BREAKFAST FOR THE                            NEXT YEAR.                           AVOID TRIGGERS FOR GASTRITIS.                           FOLLOW UP WITH DR. Necia Kamm IF NEEDED.                           - Patient has a contact number available for                            emergencies. The signs and symptoms of potential                            delayed complications were discussed with the                            patient. Return to normal activities tomorrow.                            Written discharge instructions  were provided to the                            patient. Procedure Code(s):        --- Professional ---                           [phone], Esophagogastroduodenoscopy, flexible,                            transoral; diagnostic, including collection of                            specimen(s) by brushing or washing, when performed                            (separate procedure) Diagnosis Code(s):        --- Professional ---                           K29.70, Gastritis, unspecified, without bleeding  R93.3, Abnormal findings on diagnostic imaging of                            other parts of digestive tract CPT copyright 2016 American [employer]. All rights reserved. The codes documented in this report are preliminary and upon coder review may  be revised to meet current compliance requirements. Barney Drain, MD Barney Drain, MD 11/24/2015 4:56:11 PM This report has been signed electronically. Number of Addenda: 0

## 2015-11-25 ENCOUNTER — Ambulatory Visit (INDEPENDENT_AMBULATORY_CARE_PROVIDER_SITE_OTHER): Payer: Medicaid Other | Admitting: Cardiology

## 2015-11-25 ENCOUNTER — Encounter: Payer: Self-pay | Admitting: Cardiology

## 2015-11-25 ENCOUNTER — Other Ambulatory Visit: Payer: Self-pay | Admitting: General Surgery

## 2015-11-25 VITALS — BP 118/70 | HR 101 | Ht 62.0 in | Wt 109.0 lb

## 2015-11-25 DIAGNOSIS — R931 Abnormal findings on diagnostic imaging of heart and coronary circulation: Secondary | ICD-10-CM | POA: Diagnosis not present

## 2015-11-25 DIAGNOSIS — Z01810 Encounter for preprocedural cardiovascular examination: Secondary | ICD-10-CM

## 2015-11-25 DIAGNOSIS — I10 Essential (primary) hypertension: Secondary | ICD-10-CM

## 2015-11-25 DIAGNOSIS — R079 Chest pain, unspecified: Secondary | ICD-10-CM | POA: Diagnosis not present

## 2015-11-25 NOTE — Progress Notes (Addendum)
Clinical Summary Molly Campbell is a 55 y.o.female seen today as a new patient, she is referred by Dr Whitney Muse  1. Abnormal EKG/Preoperative evaluation/Chest pain - being considered for dental surgery with mutilple extractions - in preop evaluation noted to have abnormal EKG with new diffuse precordial TWIs that were new - echo 2014 with LVEF 65-70%, no WMAs, moderate LVH. ?myxoma  - can have some chest pain. Off and on for year. Pressure like pain, left side. Can be severe. +SOB, feels hot/sweaty. Lasts about 30 mintues. Occurs 1-2 times a month   2. Afib - isolated episode, apparently related to [facility] abuse at the time. No documented recurrences, no recent palpitations   3. 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 Does not appear she had f/u imaging done.   Past Medical History  Diagnosis Date  . Hypertension   . Tobacco abuse   . Alcohol abuse   . Cocaine abuse   . Alcoholic hepatitis [id number]  . Auditory hallucinations   . Anxiety disorder   . Schizophrenia ([place])   . COPD (chronic obstructive pulmonary disease) ([place])   . Emphysema   . Chronic bronchitis with acute exacerbation ([place])   . History of cervical cancer 09/26/2013  . GERD (gastroesophageal reflux disease)   . Pneumonia     hx  . Cancer ([facility])     cervical  . Scoliosis   . Chronic back pain   . Malignant neoplasm of tonsillar fossa ([place]) 11/12/2015  . Dysrhythmia 2012    hx AF in er-cocaine-converted back NSR  . Depression   . Headache      Allergies  Allergen Reactions  . Benicar [Olmesartan] Other (See Comments)    Tachycardia - she was told to never take it again  . Ibuprofen Palpitations    tachycardia     Current Outpatient Prescriptions  Medication Sig Dispense Refill  . albuterol (PROVENTIL HFA;VENTOLIN HFA) 108 (90 BASE) MCG/ACT inhaler Inhale 1-2 puffs into the lungs every 6 (six) hours as needed for wheezing or  shortness of breath. 1 Inhaler 0  . amLODipine (NORVASC) 5 MG tablet Take 5 mg by mouth daily.    Marland Kitchen HYDROcodone-acetaminophen (NORCO/VICODIN) 5-325 MG tablet Take 1 tablet by mouth every 4 (four) hours as needed for moderate pain. 90 tablet 0  . lidocaine (XYLOCAINE) 2 % solution Mix 1 part 2%viscous lidocaine,1part H2O.Swish and swallow 58mL of this mixture,47min before meals and at bedtime, up to QID (Patient taking differently: Use as directed 5 mLs in the mouth or throat See admin instructions. Mix 1 part 2% viscous lidocaine with 1part water.Swish and swallow 3mL of this mixture,67min before meals and at bedtime, up to QID) 100 mL 5  . Multiple Vitamins-Minerals (MULTIVITAMIN) tablet Take 1 tablet by mouth daily. 180 tablet 1  . nicotine (NICODERM CQ - DOSED IN MG/24 HOURS) 14 mg/24hr patch Place 1 patch (14 mg total) onto the skin daily. Apply 21 mg patch daily x 6 wk, then 14mg  patch daily x 2 wk, then 7 mg patch daily x 2 wk 14 patch 0  . nicotine (NICODERM CQ - DOSED IN MG/24 HOURS) 21 mg/24hr patch Place 1 patch (21 mg total) onto the skin daily. Apply 21 mg patch daily x 6 wk, then 14mg  patch daily x 2 wk, then 7 mg patch daily x 2 wk (Patient not taking: Reported on 11/17/2015) 14 patch 2  . nicotine (NICODERM CQ -  DOSED IN MG/24 HR) 7 mg/24hr patch Place 1 patch (7 mg total) onto the skin daily. Apply 21 mg patch daily x 6 wk, then 14mg  patch daily x 2 wk, then 7 mg patch daily x 2 wk (Patient not taking: Reported on 11/17/2015) 14 patch 0  . Nutritional Supplements (FEEDING SUPPLEMENT, OSMOLITE 1.5 CAL,) LIQD 5 cans via PEG, split into 3 meals of 1.5, 1.5 and 2 cans. Flush with 150 cc water before and after each feed (Patient taking differently: by PEG Tube route See admin instructions. 5 cans via PEG, split into 3 meals of 1.5, 1.5 and 2 cans. Flush with 150 cc water before and after each feed)  0  . omeprazole (PRILOSEC) 20 MG capsule 1 PO EVERY MORNING 31 capsule 11  .  oxyCODONE-acetaminophen (ROXICET) 5-325 MG tablet Take 1 tablet by mouth every 4 (four) hours as needed. 20 tablet 0  . polyethylene glycol powder (GLYCOLAX/MIRALAX) powder Take one capsule and mix in 4-6 ounces of water, take one to two times daily as needed for constipation. (Patient taking differently: Take 17 g by mouth 2 (two) times daily as needed (constipation). Take one capful and mix in 4-6 ounces of water, take one to two times daily as needed for constipation.) 527 g 3  . thiamine (VITAMIN B-1) 100 MG tablet Take 1 tablet (100 mg total) by mouth daily. 180 tablet 1   No current facility-administered medications for this visit.     Past Surgical History  Procedure Laterality Date  . Abdominal hysterectomy    . Colonoscopy  05/07/2002    Dr.Demason- large approximately 1.5cm polyp pedunculated aproximately 10cm from the anal verge bx= adenomatous polyp  . Esophagogastroduodenoscopy  06/02/2002    Dr. Anthony Sar- mild distal esophagitis w/o stricturing or ulceration. stomach and pylorus are normal. inflammatory changes of proximal duldenum in the first portion.   . Fracture surgery  right wrist    [place]  . Breast biopsy  2005    lt bx  . Direct laryngoscopy N/A 10/25/2015    Procedure: DIRECT LARYNGOSCOPY WITH BIOPSY OF ORAL PHARYNGEAL LESION;  Surgeon: Leta Baptist, MD;  Location: [facility];  Service: ENT;  Laterality: N/A;  DIRECT LARYNGOSCOPY WITH BIOPSY OF ORAL PHARYNGEAL LESION  . Portacath placement Right 11/18/15  . Peg placement  11/18/15     Allergies  Allergen Reactions  . Benicar [Olmesartan] Other (See Comments)    Tachycardia - she was told to never take it again  . Ibuprofen Palpitations    tachycardia      Family History  Problem Relation Age of Onset  . Heart failure Other   . Alzheimer's disease Mother   . Other Father     "he was beat to death"  . Heart disease Sister   . Gout Sister   . COPD Sister   . Seizures Sister   . Diabetes  Sister   . Diabetes Brother   . Cancer Paternal Grandmother     not sure what kind  . Seizures Sister   . Other Sister     "bones are messed up"  . Hypertension Brother   . Heart attack Brother   . Other Brother     MVA  . Diabetes Brother   . Other Brother     MVA  . Alcohol abuse Daughter   . Other Son     motorcycle accident     Social History Ms. Winning reports that she has been smoking  Cigarettes.  She has a 48 pack-year smoking history. She has quit using smokeless tobacco. Her smokeless tobacco use included Snuff. Ms. Schlatter reports that she drinks alcohol.   Review of Systems CONSTITUTIONAL: No weight loss, fever, chills, weakness or fatigue.  HEENT: Eyes: No visual loss, blurred vision, double vision or yellow sclerae.No hearing loss, sneezing, congestion, runny nose or sore throat.  SKIN: No rash or itching.  CARDIOVASCULAR: per HPI RESPIRATORY: No shortness of breath, cough or sputum.  GASTROINTESTINAL: No anorexia, nausea, vomiting or diarrhea. No abdominal pain or blood.  GENITOURINARY: No burning on urination, no polyuria NEUROLOGICAL: No headache, dizziness, syncope, paralysis, ataxia, numbness or tingling in the extremities. No change in bowel or bladder control.  MUSCULOSKELETAL: No muscle, back pain, joint pain or stiffness.  LYMPHATICS: No enlarged nodes. No history of splenectomy.  PSYCHIATRIC: No history of depression or anxiety.  ENDOCRINOLOGIC: No reports of sweating, cold or heat intolerance. No polyuria or polydipsia.  Marland Kitchen   Physical Examination Filed Vitals:   11/25/15 1458  BP: 118/70  Pulse: 101   Filed Vitals:   11/25/15 1458  Height: 5\' 2"  (1.575 m)  Weight: 109 lb (49.442 kg)    Gen: resting comfortably, no acute distress HEENT: no scleral icterus, pupils equal round and reactive, no palptable cervical adenopathy,  CV: RRR, no m/r/g, no jvd Resp: Clear to auscultation bilaterally GI: abdomen is soft, non-tender, non-distended,  normal bowel sounds, no hepatosplenomegaly MSK: extremities are warm, no edema.  Skin: warm, no rash Neuro:  no focal deficits Psych: appropriate affect   Diagnostic Studies  01/2013 Echo Study Conclusions  - Left ventricle: The cavity size was normal. There was moderate concentric hypertrophy. Systolic function was vigorous. The estimated ejection fraction was in the range of 65% to 70%. Wall motion was normal; there were no regional wall motion abnormalities. Left ventricular diastolic function parameters were normal. - Mitral valve: Mildly thickened leaflets. Mild regurgitation. - Right atrium: Central venous pressure: 65mm Hg (est). - Atrial septum: Rounded echodensity on left atrialside of septum, approximately 37mm x 68mm, suggestive of myxoma. No stalk seen. Not very mobile. - Tricuspid valve: Trivial regurgitation. - Pulmonary arteries: PA peak pressure: 27mm Hg (S). - Pericardium, extracardiac: There was no pericardial effusion. Impressions:  - No prior study for comparison. Moderate LVH with LVEF [id number], normal diastolic function. Rounded echodensity on left atrial side of septum, approximately 76mm x 57mm, suggestive of myxoma. No stalk seen. Not very mobile.   Assessment and Plan  1. Chest pain - abnormal EKG, dynamic precordial TWIs by EKG with recent chest pain symptoms. Noted TWIs on preop EKG not present on today's EKG.  - we will obtain a nuclear stress to further assess  2. 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    3. Afib - appears to have been isolated episode in setting of substance abuse, conitnue to monitor for recurrence. She has not been committed to anticoagulation at this point.    Arnoldo Lenis, M.D.  12/08/15 Addendum Echo shows normal LVEF and wall motion. Nuclear stress without clear ischemia, overall low risk. From  cardiac standpont recommend proceeding with oral surgery as planned.   Zandra Abts MD

## 2015-11-25 NOTE — Patient Instructions (Signed)
Medication Instructions:  Your physician recommends that you continue on your current medications as directed. Please refer to the Current Medication list given to you today.   Labwork: none  Testing/Procedures: Your physician has requested that you have a lexiscan myoview. For further information please visit [email or web address]. Please follow instruction sheet, as given.    Follow-Up: Your physician recommends that you schedule a follow-up appointment in: TO BE DETERMINED BASED ON TEST   Any Other Special Instructions Will Be Listed Below (If Applicable).  * DO NOT TAKE YOUR NORVASC THE MORNING OF THE STRESS TEST*    If you need a refill on your cardiac medications before your next appointment, please call your pharmacy.

## 2015-11-26 ENCOUNTER — Ambulatory Visit (HOSPITAL_COMMUNITY): Payer: Medicaid Other

## 2015-11-26 ENCOUNTER — Ambulatory Visit (HOSPITAL_COMMUNITY)
Admission: RE | Admit: 2015-11-26 | Discharge: 2015-11-26 | Disposition: A | Discharge status: Home / Self Care | Payer: Medicaid Other | Source: Ambulatory Visit | Attending: Radiation Oncology | Admitting: Radiation Oncology

## 2015-11-26 ENCOUNTER — Encounter (HOSPITAL_COMMUNITY): Payer: Self-pay

## 2015-11-26 DIAGNOSIS — Z888 Allergy status to other drugs, medicaments and biological substances status: Secondary | ICD-10-CM | POA: Diagnosis not present

## 2015-11-26 DIAGNOSIS — Z931 Gastrostomy status: Secondary | ICD-10-CM | POA: Insufficient documentation

## 2015-11-26 DIAGNOSIS — J449 Chronic obstructive pulmonary disease, unspecified: Secondary | ICD-10-CM | POA: Insufficient documentation

## 2015-11-26 DIAGNOSIS — C090 Malignant neoplasm of tonsillar fossa: Secondary | ICD-10-CM | POA: Insufficient documentation

## 2015-11-26 DIAGNOSIS — G8929 Other chronic pain: Secondary | ICD-10-CM | POA: Insufficient documentation

## 2015-11-26 DIAGNOSIS — Z833 Family history of diabetes mellitus: Secondary | ICD-10-CM | POA: Insufficient documentation

## 2015-11-26 DIAGNOSIS — R1900 Intra-abdominal and pelvic swelling, mass and lump, unspecified site: Secondary | ICD-10-CM | POA: Diagnosis present

## 2015-11-26 DIAGNOSIS — K219 Gastro-esophageal reflux disease without esophagitis: Secondary | ICD-10-CM | POA: Diagnosis not present

## 2015-11-26 DIAGNOSIS — C49A9 Gastrointestinal stromal tumor of other sites: Secondary | ICD-10-CM | POA: Insufficient documentation

## 2015-11-26 DIAGNOSIS — Z79899 Other long term (current) drug therapy: Secondary | ICD-10-CM | POA: Diagnosis not present

## 2015-11-26 DIAGNOSIS — Z91040 Latex allergy status: Secondary | ICD-10-CM | POA: Insufficient documentation

## 2015-11-26 DIAGNOSIS — M549 Dorsalgia, unspecified: Secondary | ICD-10-CM | POA: Diagnosis not present

## 2015-11-26 DIAGNOSIS — Z8249 Family history of ischemic heart disease and other diseases of the circulatory system: Secondary | ICD-10-CM | POA: Diagnosis not present

## 2015-11-26 DIAGNOSIS — I10 Essential (primary) hypertension: Secondary | ICD-10-CM | POA: Diagnosis not present

## 2015-11-26 DIAGNOSIS — Z8541 Personal history of malignant neoplasm of cervix uteri: Secondary | ICD-10-CM | POA: Insufficient documentation

## 2015-11-26 DIAGNOSIS — F17210 Nicotine dependence, cigarettes, uncomplicated: Secondary | ICD-10-CM | POA: Insufficient documentation

## 2015-11-26 DIAGNOSIS — Z825 Family history of asthma and other chronic lower respiratory diseases: Secondary | ICD-10-CM | POA: Insufficient documentation

## 2015-11-26 DIAGNOSIS — K3189 Other diseases of stomach and duodenum: Secondary | ICD-10-CM

## 2015-11-26 MED ORDER — LIDOCAINE HCL 1 % IJ SOLN
INTRAMUSCULAR | Status: AC
  Filled 2015-11-26: qty 20

## 2015-11-26 MED ORDER — HEPARIN SOD (PORK) LOCK FLUSH 100 UNIT/ML IV SOLN
500.0000 [IU] | INTRAVENOUS | Status: AC | PRN
  Administered 2015-11-26: 500 [IU]

## 2015-11-26 MED ORDER — HYDROCODONE-ACETAMINOPHEN 5-325 MG PO TABS
ORAL_TABLET | ORAL | Status: AC
  Filled 2015-11-26: qty 1

## 2015-11-26 MED ORDER — HYDROCODONE-ACETAMINOPHEN 5-325 MG PO TABS
1.0000 | ORAL_TABLET | ORAL | Status: DC | PRN
  Administered 2015-11-26: 1 via ORAL

## 2015-11-26 MED ORDER — SODIUM CHLORIDE 0.9 % IV SOLN
INTRAVENOUS | Status: DC
  Administered 2015-11-26: 12:00:00 via INTRAVENOUS

## 2015-11-26 MED ORDER — FENTANYL CITRATE (PF) 100 MCG/2ML IJ SOLN
INTRAMUSCULAR | Status: AC
  Filled 2015-11-26: qty 2

## 2015-11-26 MED ORDER — FENTANYL CITRATE (PF) 100 MCG/2ML IJ SOLN
INTRAMUSCULAR | Status: AC | PRN
  Administered 2015-11-26 (×2): 50 ug via INTRAVENOUS

## 2015-11-26 MED ORDER — MIDAZOLAM HCL 2 MG/2ML IJ SOLN
INTRAMUSCULAR | Status: AC | PRN
  Administered 2015-11-26 (×2): 1 mg via INTRAVENOUS

## 2015-11-26 MED ORDER — MIDAZOLAM HCL 2 MG/2ML IJ SOLN
INTRAMUSCULAR | Status: AC
  Filled 2015-11-26: qty 2

## 2015-11-26 NOTE — Sedation Documentation (Signed)
Patient denies pain and is resting comfortably.  

## 2015-11-26 NOTE — Discharge Instructions (Signed)
Needle Biopsy, Care After °These instructions give you information about caring for yourself after your procedure. Your doctor may also give you more specific instructions. Call your doctor if you have any problems or questions after your procedure. °HOME CARE °· Rest as told by your doctor. °· Take medicines only as told by your doctor. °· There are many different ways to close and cover the biopsy site, including stitches (sutures), skin glue, and adhesive strips. Follow instructions from your doctor about: °¨ How to take care of your biopsy site. °¨ When and how you should change your bandage (dressing). °¨ When you should remove your dressing. °¨ Removing whatever was used to close your biopsy site. °· Check your biopsy site every day for signs of infection. Watch for: °¨ Redness, swelling, or pain. °¨ Fluid, blood, or pus. °GET HELP IF: °· You have a fever. °· You have redness, swelling, or pain at the biopsy site, and it lasts longer than a few days. °· You have fluid, blood, or pus coming from the biopsy site. °· You feel sick to your stomach (nauseous). °· You throw up (vomit). °GET HELP RIGHT AWAY IF: °· You are short of breath. °· You have trouble breathing. °· Your chest hurts. °· You feel dizzy or you pass out (faint). °· You have bleeding that does not stop with pressure or a bandage. °· You cough up blood. °· Your belly (abdomen) hurts. °  °This information is not intended to replace advice given to you by your health care provider. Make sure you discuss any questions you have with your health care provider. °  °Document Released: 04/06/2008 Document Revised: 09/08/2014 Document Reviewed: 04/20/2014 °Elsevier Interactive Patient Education ©2016 [employer] ° °

## 2015-11-26 NOTE — Procedures (Signed)
Interventional Radiology Procedure Note  Procedure:  CT guided core biopsy of abdominal mass  Complications:  None  Estimated Blood Loss: < 10 mL  2.3 x 4.3 cm bilobed mass anterior to stomach localized.  18 G core biopsy x 3 via 17 G needle.  Venetia Night. Kathlene Cote, M.D Pager:  [phone]

## 2015-11-26 NOTE — Sedation Documentation (Signed)
Patient is resting comfortably. 

## 2015-11-26 NOTE — Progress Notes (Signed)
Pt moving a lot in bed, pt eating and has family and baby in room, has a hard time laying still even for bp readings.

## 2015-11-26 NOTE — H&P (Signed)
Chief Complaint: Abdominal mass  Referring Physician(s): Eppie Gibson  Supervising Physician: Aletta Edouard  Patient Status: Outpatient  History of Present Illness: Molly Campbell is a 55 y.o. female who is known to our service.  She had a Port A Cath and Gastrostomy tube on 11/18/2015 by Dr. Annamaria Boots.  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  She feels ok. No recent illness, fever/chills  She does not take blood thinners.  She is NPO.   Past Medical History  Diagnosis Date  . Hypertension   . Tobacco abuse   . Alcohol abuse   . Cocaine abuse   . Alcoholic hepatitis 99991111  . Auditory hallucinations   . Anxiety disorder   . Schizophrenia ([place])   . COPD (chronic obstructive pulmonary disease) ([place])   . Emphysema   . Chronic bronchitis with acute exacerbation ([place])   . History of cervical cancer 09/26/2013  . GERD (gastroesophageal reflux disease)   . Pneumonia     hx  . Cancer ([facility])     cervical  . Scoliosis   . Chronic back pain   . Malignant neoplasm of tonsillar fossa ([place]) 11/12/2015  . Dysrhythmia 2012    hx AF in er-cocaine-converted back NSR  . Depression   . Headache     Past Surgical History  Procedure Laterality Date  . Abdominal hysterectomy    . Colonoscopy  05/07/2002    Dr.Demason- large approximately 1.5cm polyp pedunculated aproximately 10cm from the anal verge bx= adenomatous polyp  . Esophagogastroduodenoscopy  06/02/2002    Dr. Anthony Sar- mild distal esophagitis w/o stricturing or ulceration. stomach and pylorus are normal. inflammatory changes of proximal duldenum in the first portion.   . Fracture surgery  right wrist    [facility]  . Breast biopsy  2005    lt bx  . Direct laryngoscopy N/A 10/25/2015     Procedure: DIRECT LARYNGOSCOPY WITH BIOPSY OF ORAL PHARYNGEAL LESION;  Surgeon: Leta Baptist, MD;  Location: [facility];  Service: ENT;  Laterality: N/A;  DIRECT LARYNGOSCOPY WITH BIOPSY OF ORAL PHARYNGEAL LESION  . Portacath placement Right 11/18/15  . Peg placement  11/18/15    Allergies: Benicar; Latex; and Ibuprofen  Medications: Prior to Admission medications   Medication Sig Start Date End Date Taking? Authorizing Provider  albuterol (PROVENTIL HFA;VENTOLIN HFA) 108 (90 BASE) MCG/ACT inhaler Inhale 1-2 puffs into the lungs every 6 (six) hours as needed for wheezing or shortness of breath. 08/18/14  Yes [place], NP  amLODipine (NORVASC) 5 MG tablet Take 5 mg by mouth daily.   Yes Historical Provider, MD  HYDROcodone-acetaminophen (NORCO/VICODIN) 5-325 MG tablet Take 1 tablet by mouth every 4 (four) hours as needed for moderate pain. 11/19/15  Yes Patrici Ranks, MD  lidocaine (XYLOCAINE) 2 % solution Mix 1 part 2%viscous lidocaine,1part H2O.Swish and swallow 29mL of this mixture,78min before meals and at bedtime, up to QID Patient taking differently: Use as directed 5 mLs in the mouth or throat See admin instructions. Mix 1 part 2% viscous lidocaine with 1part water.Swish and swallow 18mL of this mixture,61min before meals and at bedtime, up to QID 11/12/15  Yes Eppie Gibson, MD  Multiple Vitamins-Minerals (MULTIVITAMIN) tablet Take 1 tablet by mouth daily. 11/12/15  Yes  Eppie Gibson, MD  Nutritional Supplements (FEEDING SUPPLEMENT, OSMOLITE 1.5 CAL,) LIQD 5 cans via PEG, split into 3 meals of 1.5, 1.5 and 2 cans. Flush with 150 cc water before and after each feed Patient taking differently: by PEG Tube route See admin instructions. 5 cans via PEG, split into 3 meals of 1.5, 1.5 and 2 cans. Flush with 150 cc water before and after each feed 11/19/15  Yes Patrici Ranks, MD  omeprazole (PRILOSEC) 20 MG capsule 1 PO EVERY MORNING 11/23/15  Yes Danie Binder, MD  thiamine  (VITAMIN B-1) 100 MG tablet Take 1 tablet (100 mg total) by mouth daily. 11/12/15  Yes Eppie Gibson, MD  nicotine (NICODERM CQ - DOSED IN MG/24 HOURS) 14 mg/24hr patch Place 1 patch (14 mg total) onto the skin daily. Apply 21 mg patch daily x 6 wk, then 14mg  patch daily x 2 wk, then 7 mg patch daily x 2 wk 11/12/15   Eppie Gibson, MD  nicotine (NICODERM CQ - DOSED IN MG/24 HOURS) 21 mg/24hr patch Place 1 patch (21 mg total) onto the skin daily. Apply 21 mg patch daily x 6 wk, then 14mg  patch daily x 2 wk, then 7 mg patch daily x 2 wk 11/12/15   Eppie Gibson, MD  nicotine (NICODERM CQ - DOSED IN MG/24 HR) 7 mg/24hr patch Place 1 patch (7 mg total) onto the skin daily. Apply 21 mg patch daily x 6 wk, then 14mg  patch daily x 2 wk, then 7 mg patch daily x 2 wk 11/12/15   Eppie Gibson, MD  polyethylene glycol powder [facility]) powder Take one capsule and mix in 4-6 ounces of water, take one to two times daily as needed for constipation. Patient taking differently: Take 17 g by mouth 2 (two) times daily as needed (constipation). Take one capful and mix in 4-6 ounces of water, take one to two times daily as needed for constipation. 11/22/15   Mahala Menghini, PA-C     Family History  Problem Relation Age of Onset  . Heart failure Other   . Alzheimer's disease Mother   . Other Father     "he was beat to death"  . Heart disease Sister   . Gout Sister   . COPD Sister   . Seizures Sister   . Diabetes Sister   . Diabetes Brother   . Cancer Paternal Grandmother     not sure what kind  . Seizures Sister   . Other Sister     "bones are messed up"  . Hypertension Brother   . Heart attack Brother   . Other Brother     MVA  . Diabetes Brother   . Other Brother     MVA  . Alcohol abuse Daughter   . Other Son     motorcycle accident    Social History   Social History  . Marital Status: Widowed    Spouse Name: N/A  . Number of Children: 3  . Years of Education: N/A   Social History Main Topics    . Smoking status: Current Every Day Smoker -- 1.00 packs/day for 48 years    Types: Cigarettes  . Smokeless tobacco: Former Systems developer    Types: Snuff     Comment: One pack a day  . Alcohol Use: 0.0 oz/week    0 Standard drinks or equivalent per week     Comment: 40 oz a day  . Drug Use: Yes    Special: Cocaine,  Marijuana     Comment: Last cocaine used 2 weeks  . Sexual Activity: Not Currently    Birth Control/ Protection: Surgical   Other Topics Concern  . None   Social History Narrative    Review of Systems: A 12 point ROS discussed  Review of Systems  Constitutional: Positive for appetite change and fatigue. Negative for fever, chills and activity change.  HENT: Positive for sore throat and trouble swallowing.   Respiratory: Negative for cough and shortness of breath.   Cardiovascular: Negative for chest pain.  Gastrointestinal: Negative for nausea, vomiting and abdominal pain.  Genitourinary: Negative.   Musculoskeletal: Negative.   Skin: Negative.   Neurological: Negative.   Hematological: Negative.   Psychiatric/Behavioral: Negative.     Vital Signs: BP 171/89 mmHg  Pulse 86  Temp(Src) 98.5 F (36.9 C) (Oral)  Resp 18  Ht 5\' 2"  (1.575 m)  Wt 109 lb (49.442 kg)  BMI 19.93 kg/m2  SpO2 100%  Physical Exam  Constitutional: She is oriented to person, place, and time. She appears well-developed and well-nourished.  HENT:  Head: Normocephalic and atraumatic.  Eyes: EOM are normal.  Neck: Neck supple.  Cardiovascular: Normal rate, regular rhythm and normal heart sounds.   No murmur heard. Pulmonary/Chest: Effort normal.  Some coarse rhonchi on the left.  Abdominal: Soft. Bowel sounds are normal. She exhibits no distension. There is no tenderness.  Gastrostomy tube in place.  Musculoskeletal: Normal range of motion.  Neurological: She is alert and oriented to person, place, and time.  Skin: Skin is warm and dry.  Psychiatric: She has a normal mood and affect. Her  behavior is normal. Judgment and thought content normal.  Vitals reviewed.   Mallampati Score:  MD Evaluation Airway: WNL Heart: WNL Abdomen: WNL Chest/ Lungs: Other (comments) Chest/ lungs comments: Mild rhonchi on the left ASA  Classification: 3 Mallampati/Airway Score: Two  Imaging: Ir Gastrostomy Tube Mod Sed  11/18/2015  INDICATION: TONSILLAR SQUAMOUS CELL CARCINOMA, HEAD AND NECK CANCER, FEEDING TUBE FOR NUTRITION. EXAM: FLUOROSCOPIC 20 FRENCH PULL-THROUGH GASTROSTOMY Date:  7/13/20177/13/2017 4:32 pm Radiologist:  M. Daryll Brod, MD Guidance:  Fluoroscopic MEDICATIONS: 2 g Ancef; Antibiotics were administered within 1 hour of the procedure. Glucagon 1 mg IV ANESTHESIA/SEDATION: Versed 4 mg IV; Fentanyl 75 mcg IV Moderate Sedation Time:  18 minutes The patient was continuously monitored during the procedure by the interventional radiology nurse under my direct supervision. CONTRAST:  10 cc Isovue 300 - administered into the gastric lumen. FLUOROSCOPY TIME:  Fluoroscopy Time: 8 minutes 0 seconds (51 mGy). COMPLICATIONS: None immediate. PROCEDURE: Informed consent was obtained from the patient following explanation of the procedure, risks, benefits and alternatives. The patient understands, agrees and consents for the procedure. All questions were addressed. A time out was performed. Maximal barrier sterile technique utilized including caps, mask, sterile gowns, sterile gloves, large sterile drape, hand hygiene, and betadine prep. The left upper quadrant was sterilely prepped and draped. An oral gastric catheter was inserted into the stomach under fluoroscopy. The existing nasogastric feeding tube was removed. Air was injected into the stomach for insufflation and visualization under fluoroscopy. The air distended stomach was confirmed beneath the anterior abdominal wall in the frontal and lateral projections. Under sterile conditions and local anesthesia, a 81 gauge trocar needle was utilized  to access the stomach percutaneously beneath the left subcostal margin. Needle position was confirmed within the stomach under biplane fluoroscopy. Contrast injection confirmed position also. A single T tack was deployed for gastropexy.  Over an Amplatz guide wire, a 9-French sheath was inserted into the stomach. A snare device was utilized to capture the oral gastric catheter. The snare device was pulled retrograde from the stomach up the esophagus and out the oropharynx. The 20-French pull-through gastrostomy was connected to the snare device and pulled antegrade through the oropharynx down the esophagus into the stomach and then through the percutaneous tract external to the patient. The gastrostomy was assembled externally. Contrast injection confirms position in the stomach. Images were obtained for documentation. The patient tolerated procedure well. No immediate complication. IMPRESSION: Fluoroscopic insertion of a 20-French "pull-through" gastrostomy. Electronically Signed   By: Jerilynn Mages.  Shick M.D.   On: 11/18/2015 16:40   Nm Pet Image Initial (pi) Skull Base To Thigh  11/10/2015  CLINICAL DATA:  Initial treatment strategy for right tonsillar squamous cell carcinoma diagnosed 10/25/2015. EXAM: NUCLEAR MEDICINE PET SKULL BASE TO THIGH TECHNIQUE: 5.7 mCi F-18 FDG was injected intravenously. Full-ring PET imaging was performed from the skull base to thigh after the radiotracer. CT data was obtained and used for attenuation correction and anatomic localization. FASTING BLOOD GLUCOSE:  Value: 110 mg/dl COMPARISON:  10/05/2015 neck CT. FINDINGS: NECK There is symmetric mild enlargement of the palatine tonsils bilaterally. There is asymmetric hypermetabolism in the right palatine tonsil with max SUV 9.1. There is hypermetabolism in the left palatine tonsil with max SUV 7.6. Mildly enlarged hypermetabolic 1.0 cm right level 2 lymph node near the angle of the mandible with max SUV 5.0 (series 4/image 21). Mildly enlarged  hypermetabolic 1.0 cm left level 2 lymph node near the angle of the mandible with max SUV 4.4 (series 4/image 23). No additional hypermetabolic lymph nodes in the neck. CHEST There is a 0.7 cm left upper lobe pulmonary nodule (series 6/image 23) with associated mild metabolism with max SUV 1.9 (which is considered significant for a nodule of this small size). At least 3 additional smaller pulmonary nodules in the right upper lobe and left lower lobe are below PET resolution. No acute consolidative airspace disease. No hypermetabolic axillary, mediastinal or hilar nodes. Left anterior descending, left circumflex and right coronary atherosclerosis. Atherosclerotic nonaneurysmal thoracic aorta. Mild centrilobular and paraseptal emphysema with mild diffuse bronchial wall thickening. ABDOMEN/PELVIS There is a lobulated solid homogeneous 4.9 x 2.5 cm hypermetabolic exophytic anterior proximal gastric mass with max SUV 24.8 (series 4/image 99). Stomach is collapsed and otherwise grossly normal. No abnormal hypermetabolic activity within the liver, pancreas, adrenal glands, or spleen. No hypermetabolic lymph nodes in the abdomen or pelvis. Atherosclerotic nonaneurysmal abdominal aorta. SKELETON No focal hypermetabolic activity to suggest skeletal metastasis. IMPRESSION: 1. Bilateral palatine tonsil enlargement and hypermetabolism, with asymmetric hypermetabolism in the right palatine tonsil, consistent with primary malignancy in this location. Malignant involvement of the contralateral (left) palatine tonsil cannot be excluded. 2. Bilateral mildly enlarged hypermetabolic level 2 neck lymph nodes, suspicious for nodal metastases. 3. Four subcentimeter pulmonary nodules scattered in both lungs, the largest of which demonstrates mild metabolism (max SUV 1.9), which is considered significant for a nodule of this small size, and raises concern for pulmonary metastases. 4. Solid lobulated hypermetabolic 4.9 x 2.5 cm gastric mass  arising exophytically from the anterior proximal stomach, most suggestive of a gastric GIST. Tissue sampling is advised for this mass, probably best obtained via upper endoscopic approach. 5. Three-vessel coronary atherosclerosis.  Aortic atherosclerosis. 6. Mild centrilobular and paraseptal emphysema with mild diffuse bronchial wall thickening, suggesting COPD. Electronically Signed   By: Janina Mayo.D.  On: 11/10/2015 15:10   Ir Fluoro Guide Cv Line Right  11/18/2015  CLINICAL DATA:  Metastatic tonsillar squamous cell carcinoma. Access for chemotherapy EXAM: RIGHT INTERNAL JUGULAR SINGLE LUMEN POWER PORT CATHETER INSERTION Date:  7/13/20177/13/2017 3:44 pm Radiologist:  M. Daryll Brod, MD Guidance:  Ultrasound and fluoroscopic MEDICATIONS: 2 g and Theresia Lo; The antibiotic was administered within an appropriate time interval prior to skin puncture. ANESTHESIA/SEDATION: Versed 4 mg IV; Fentanyl 100 mcg IV; Moderate Sedation Time:  26 minutes The patient was continuously monitored during the procedure by the interventional radiology nurse under my direct supervision. FLUOROSCOPY TIME:  30 seconds (2 mGy) COMPLICATIONS: None immediate. CONTRAST:  None. PROCEDURE: Informed consent was obtained from the patient following explanation of the procedure, risks, benefits and alternatives. The patient understands, agrees and consents for the procedure. All questions were addressed. A time out was performed. Maximal barrier sterile technique utilized including caps, mask, sterile gowns, sterile gloves, large sterile drape, hand hygiene, and 2% chlorhexidine scrub. Under sterile conditions and local anesthesia, right internal jugular micropuncture venous access was performed. Access was performed with ultrasound. Images were obtained for documentation. A guide wire was inserted followed by a transitional dilator. This allowed insertion of a guide wire and catheter into the IVC. Measurements were obtained from the SVC / RA  junction back to the right IJ venotomy site. In the right infraclavicular chest, a subcutaneous pocket was created over the second anterior rib. This was done under sterile conditions and local anesthesia. 1% lidocaine with epinephrine was utilized for this. A 2.5 cm incision was made in the skin. Blunt dissection was performed to create a subcutaneous pocket over the right pectoralis major muscle. The pocket was flushed with saline vigorously. There was adequate hemostasis. The port catheter was assembled and checked for leakage. The port catheter was secured in the pocket with two retention sutures. The tubing was tunneled subcutaneously to the right venotomy site and inserted into the SVC/RA junction through a valved peel-away sheath. Position was confirmed with fluoroscopy. Images were obtained for documentation. The patient tolerated the procedure well. No immediate complications. Incisions were closed in a two layer fashion with 4 - 0 Vicryl suture. Dermabond was applied to the skin. The port catheter was accessed, blood was aspirated followed by saline and heparin flushes. Needle was removed. A dry sterile dressing was applied. IMPRESSION: Ultrasound and fluoroscopically guided right internal jugular single lumen power port catheter insertion. Tip in the SVC/RA junction. Catheter ready for use. Electronically Signed   By: Jerilynn Mages.  Shick M.D.   On: 11/18/2015 16:29   Ir US Guide Vasc Access Right  11/18/2015  CLINICAL DATA:  Metastatic tonsillar squamous cell carcinoma. Access for chemotherapy EXAM: RIGHT INTERNAL JUGULAR SINGLE LUMEN POWER PORT CATHETER INSERTION Date:  7/13/20177/13/2017 3:44 pm Radiologist:  M. Daryll Brod, MD Guidance:  Ultrasound and fluoroscopic MEDICATIONS: 2 g and Theresia Lo; The antibiotic was administered within an appropriate time interval prior to skin puncture. ANESTHESIA/SEDATION: Versed 4 mg IV; Fentanyl 100 mcg IV; Moderate Sedation Time:  26 minutes The patient was continuously  monitored during the procedure by the interventional radiology nurse under my direct supervision. FLUOROSCOPY TIME:  30 seconds (2 mGy) COMPLICATIONS: None immediate. CONTRAST:  None. PROCEDURE: Informed consent was obtained from the patient following explanation of the procedure, risks, benefits and alternatives. The patient understands, agrees and consents for the procedure. All questions were addressed. A time out was performed. Maximal barrier sterile technique utilized including caps, mask, sterile gowns, sterile gloves,  large sterile drape, hand hygiene, and 2% chlorhexidine scrub. Under sterile conditions and local anesthesia, right internal jugular micropuncture venous access was performed. Access was performed with ultrasound. Images were obtained for documentation. A guide wire was inserted followed by a transitional dilator. This allowed insertion of a guide wire and catheter into the IVC. Measurements were obtained from the SVC / RA junction back to the right IJ venotomy site. In the right infraclavicular chest, a subcutaneous pocket was created over the second anterior rib. This was done under sterile conditions and local anesthesia. 1% lidocaine with epinephrine was utilized for this. A 2.5 cm incision was made in the skin. Blunt dissection was performed to create a subcutaneous pocket over the right pectoralis major muscle. The pocket was flushed with saline vigorously. There was adequate hemostasis. The port catheter was assembled and checked for leakage. The port catheter was secured in the pocket with two retention sutures. The tubing was tunneled subcutaneously to the right venotomy site and inserted into the SVC/RA junction through a valved peel-away sheath. Position was confirmed with fluoroscopy. Images were obtained for documentation. The patient tolerated the procedure well. No immediate complications. Incisions were closed in a two layer fashion with 4 - 0 Vicryl suture. Dermabond was  applied to the skin. The port catheter was accessed, blood was aspirated followed by saline and heparin flushes. Needle was removed. A dry sterile dressing was applied. IMPRESSION: Ultrasound and fluoroscopically guided right internal jugular single lumen power port catheter insertion. Tip in the SVC/RA junction. Catheter ready for use. Electronically Signed   By: Jerilynn Mages.  Shick M.D.   On: 11/18/2015 16:29    Labs:  CBC:  Recent Labs  04/22/15 1646 11/18/15 1315  WBC 4.7 5.7  HGB 15.4* 13.7  HCT 44.7 39.5  PLT 357 552*    COAGS:  Recent Labs  11/18/15 1315  INR 0.98  APTT 33    BMP:  Recent Labs  04/22/15 1745 10/22/15 1039 11/18/15 1315  NA 135 137 140  K 3.8 3.8 4.6  CL 100* 104 107  CO2 26 25 25   GLUCOSE 114* 100* 106*  BUN 10 14 6   CALCIUM 9.2 9.1 9.2  CREATININE 0.82 0.78 0.74  GFRNONAA >60 >60 >60  GFRAA >60 >60 >60    LIVER FUNCTION TESTS:  Recent Labs  04/22/15 1745 10/22/15 1039  BILITOT 0.3 0.6  AST 25 19  ALT 18 12*  ALKPHOS 79 79  PROT 7.6 7.2  ALBUMIN 4.2 3.9    TUMOR MARKERS: No results for input(s): AFPTM, CEA, CA199, CHROMGRNA in the last 8760 hours.  Assessment and Plan:  PET scan revealed a lobulated solid homogeneous 4.9 x 2.5 cm hypermetabolic exophytic anterior proximal gastric mass with max SUV 24.8 suggestive of GIST tumor  Will proceed with CT guided biopsy today by Dr. Kathlene Cote.  Risks and Benefits discussed with the patient including, but not limited to bleeding, infection, damage to adjacent structures or low yield requiring additional tests.  All of the patient's questions were answered, patient is agreeable to proceed. Consent signed and in chart.  Thank you for this interesting consult.  I greatly enjoyed meeting CAETLYN AY and look forward to participating in their care.  A copy of this report was sent to the requesting provider on this date.  Electronically Signed: Murrell Redden PA-C 11/26/2015, 11:54  AM   I spent a total of  25 Minutes in face to face in clinical consultation, greater than 50% of  which was counseling/coordinating care for CT guided biopsy.

## 2015-11-29 ENCOUNTER — Encounter (HOSPITAL_COMMUNITY): Payer: Self-pay | Admitting: Gastroenterology

## 2015-11-29 ENCOUNTER — Ambulatory Visit: Payer: Self-pay | Admitting: Cardiology

## 2015-11-29 ENCOUNTER — Other Ambulatory Visit (HOSPITAL_COMMUNITY): Payer: Self-pay | Admitting: Emergency Medicine

## 2015-11-29 ENCOUNTER — Other Ambulatory Visit: Payer: Self-pay

## 2015-11-29 DIAGNOSIS — I5189 Other ill-defined heart diseases: Secondary | ICD-10-CM

## 2015-11-29 MED ORDER — AMOXICILLIN-POT CLAVULANATE 875-125 MG PO TABS: 1.0000 | ORAL_TABLET | Freq: Two times a day (BID) | ORAL | 0 refills | Status: DC

## 2015-11-29 NOTE — Patient Instructions (Addendum)
[place]   CHEMOTHERAPY INSTRUCTIONS  Premeds: Aloxi - for nausea/vomiting prevention/reduction. Emend - for nausea/vomiting prevention/reduction. Dexamethasone - steroid - given to reduce the risk of you having an allergic type reaction to the chemotherapy. Dex can cause you to feel energized, nervous/anxious/jittery, make you have trouble sleeping, and/or make you feel hot/flushed in the face/neck and/or look pink/red in the face/neck. These side effects will pass as the Dex wears off. (takes 30 minutes to infuse)   Cisplatin - this medication is hard on your kidneys. This is why we give you a large amount of fluids through your IV while you are here getting chemo. We also need you drinking 64 oz of fluid (preferably water/decaff fluids) everyday. Drink more if you can. This will help keep your kidneys flushed. This can also cause acute and/or delayed nausea/vomiting. You must take your nausea meds as prescribed if you get nauseated. Do not wait until you start vomiting. This med can also cause peripheral neuropathy (numbness/tingling/burning in hands/fingers/feet/toes). Let us know if this develops so that we can monitor it and treat if necessary.   Taxol - the first time you receive this drug we will titrate it very slowly to ensure that you do not have or are not having an infusional reaction to the chemo. Side Effects: hair loss, lowers your white blood cells (fight infection), muscle aches, nausea/vomiting, irritation to the mouth (mouth sores, pain in your mouth) *neuropathy - numbness/tingling/burning in hands/fingers/feet/toes. We need to know as soon as this begins to happen so that we can monitor it and treat if necessary. The numbness generally begins in the fingertips of tips of toes and then begins to travel up the finger/toe/hand/foot. We never want you getting to where you can't pick up a pen, coin, zip a zipper, button a button, or have trouble  walking. You must tell us immediately if you are experiencing peripheral neuropathy! (the first time you receive this, it will take approximately 3 hours, after that it will only take 1 hour)   POTENTIAL SIDE EFFECTS OF TREATMENT: Increased Susceptibility to Infection, Vomiting, Constipation, Hair Thinning, Changes in Character of Skin and Nails (brittleness, dryness,etc.), Bone Marrow Suppression, Nausea, Diarrhea, Sun Sensitivity and Mouth Sores   EDUCATIONAL MATERIALS GIVEN AND REVIEWED: Chemotherapy and You booklet Specific Instructions Sheets: Cisplatin, Neulasta, Aloxi, Emend, Dexamethasone, Zofran, Compazine, EMLA cream   SELF CARE ACTIVITIES WHILE ON CHEMOTHERAPY:  Increase your fluid intake 48 hours prior to treatment and drink at least 2 quarts per day after treatment., No alcohol intake., No aspirin or other medications unless approved by your oncologist., Eat foods that are light and easy to digest., Eat foods at cold or room temperature., No fried, fatty, or spicy foods immediately before or after treatment., Have teeth cleaned professionally before starting treatment. Keep dentures and partial plates clean., Use soft toothbrush and do not use mouthwashes that contain alcohol. Biotene is a good mouthwash that is available at most pharmacies or may be ordered by calling [phone]., Use warm salt water gargles (1 teaspoon salt per 1 quart warm water) before and after meals and at bedtime. Or you may rinse with 2 tablespoons of three -percent hydrogen peroxide mixed in eight ounces of water., Always use sunscreen with SPF (Sun Protection Factor) of 30 or higher., Use your nausea medication as directed to prevent nausea., Use your stool softener or laxative as directed to prevent constipation. and Use your anti-diarrheal medication as directed to stop diarrhea.  Please wash your hands for at least 30 seconds using warm soapy water. Handwashing is the #1 way to prevent the spread of  germs. Stay away from sick people or people who are getting over a cold. If you develop respiratory systems such as green/yellow mucus production or productive cough or persistent cough let us know and we will see if you need an antibiotic. It is a good idea to keep a pair of gloves on when going into grocery stores/[employer] to decrease your risk of coming into contact with germs on the carts, etc. Carry alcohol hand gel with you at all times and use it frequently if out in public. All foods need to be cooked thoroughly. No raw foods. No medium or undercooked meats, eggs. If your food is cooked medium well, it does not need to be hot pink or saturated with bloody liquid at all. Vegetables and fruits need to be washed/rinsed under the faucet with a dish detergent before being consumed. You can eat raw fruits and vegetables unless we tell you otherwise but it would be best if you cooked them or bought frozen. Do not eat off of salad bars or hot bars unless you really trust the cleanliness of the restaurant. If you need dental work, please let Dr. Whitney Muse know before you go for your appointment so that we can coordinate the best possible time for you in regards to your chemo regimen. You need to also let your dentist know that you are actively taking chemo. We may need to do labs prior to your dental appointment. We also want your bowels moving at least every other day. If this is not happening, we need to know so that we can get you on a bowel regimen to help you go. You will need to wear a condom if having sex. This is to protect your partner from potential chemotherapy exposure. Please wear a condom for 28 days post chemo. Chemo can decrease libido.   MEDICATIONS: You have been given prescriptions for the following medications:  Zofran/Ondansetron 8mg  tablet. Take 1 tablet every 8 hours as needed for nausea/vomiting. (#1 nausea med to take, this can constipate)  Compazine/Prochlorperazine 10mg  tablet. Take 1  tablet every 6 hours as needed for nausea/vomiting. (#2 nausea med to take, this can make you sleepy)  EMLA cream. Apply a quarter size amount to port site 1 hour prior to chemo. Do not rub in. Cover with plastic wrap.   SYMPTOMS TO REPORT AS SOON AS POSSIBLE AFTER TREATMENT:  FEVER GREATER THAN 100.5 F  CHILLS WITH OR WITHOUT FEVER  NAUSEA AND VOMITING THAT IS NOT CONTROLLED WITH YOUR NAUSEA MEDICATION  UNUSUAL SHORTNESS OF BREATH  UNUSUAL BRUISING OR BLEEDING  TENDERNESS IN MOUTH AND THROAT WITH OR WITHOUT PRESENCE OF ULCERS  URINARY PROBLEMS  BOWEL PROBLEMS  UNUSUAL RASH   Wear comfortable clothing and clothing appropriate for easy access to any Portacath or PICC line. Let us know if there is anything that we can do to make your therapy better!      I have been informed and understand all of the instructions given to me and have received a copy. I have been instructed to call the clinic [phone] or my family physician as soon as possible for continued medical care, if indicated. I do not have any more questions at this time but understand that I may call the [facility] or the Patient Navigator at [phone] during office hours should I have questions or  need assistance in obtaining follow-up care.

## 2015-11-29 NOTE — Progress Notes (Unsigned)
Called pts daughter [name] to let her know that chemo teaching was on Wednesday at 11:30.  Verbalized understanding.  States that she thought her mom had a tooth abscess and felt warm.  Spoke with Kirby Crigler PA and antibiotics called into her pharmacy.

## 2015-11-30 ENCOUNTER — Encounter: Payer: Self-pay | Admitting: Radiation Oncology

## 2015-11-30 NOTE — Progress Notes (Signed)
I spoke with Dr. Lyndon Code re: path results from stomach biopsy.  Verbal report of GIST tumor.  Of note, I've verified with radiology that lung nodules are too small to biopsy.  I will add pt to GI tumor board.  Dr. 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  This is a challenging case given the extent of disease, two primaries, high risk of metastases from H+N, history of drug abuse, social stressors, limited coping skills. We will do our best to customize care to this unfortunate patient.  -----------------------------------  Molly Gibson, MD

## 2015-12-01 ENCOUNTER — Encounter (HOSPITAL_COMMUNITY): Payer: Medicaid Other

## 2015-12-01 ENCOUNTER — Encounter (HOSPITAL_COMMUNITY)
Admission: RE | Admit: 2015-12-01 | Discharge: 2015-12-01 | Disposition: A | Discharge status: Home / Self Care | Payer: Medicaid Other | Source: Ambulatory Visit | Attending: Cardiology | Admitting: Cardiology

## 2015-12-01 ENCOUNTER — Other Ambulatory Visit (HOSPITAL_COMMUNITY): Payer: Self-pay | Admitting: Oncology

## 2015-12-01 ENCOUNTER — Telehealth (HOSPITAL_COMMUNITY): Payer: Self-pay | Admitting: Dentistry

## 2015-12-01 ENCOUNTER — Other Ambulatory Visit (HOSPITAL_COMMUNITY): Payer: Self-pay | Admitting: Emergency Medicine

## 2015-12-01 ENCOUNTER — Encounter: Payer: Self-pay | Admitting: Genetic Counselor

## 2015-12-01 ENCOUNTER — Inpatient Hospital Stay (HOSPITAL_COMMUNITY): Admission: RE | Admit: 2015-12-01 | Payer: Self-pay | Source: Ambulatory Visit

## 2015-12-01 ENCOUNTER — Encounter (HOSPITAL_COMMUNITY): Payer: Self-pay

## 2015-12-01 DIAGNOSIS — R079 Chest pain, unspecified: Secondary | ICD-10-CM

## 2015-12-01 MED ORDER — CEFAZOLIN SODIUM-DEXTROSE 2-4 GM/100ML-% IV SOLN
2.0000 g | INTRAVENOUS | Status: DC
Start: 2015-12-02 — End: 2015-12-01

## 2015-12-01 MED ORDER — CEPHALEXIN 500 MG PO CAPS: 500.0000 mg | ORAL_CAPSULE | Freq: Three times a day (TID) | ORAL | 0 refills | Status: DC

## 2015-12-01 NOTE — Telephone Encounter (Signed)
12/01/2015  Patient:            Molly Campbell Date of Birth:  October 25, 1960 MRN:                [id number]   I called the patient and left a message on her machine to contact Dr. Enrique Sack concerning her dental plan of care and scheduling of the operating room procedure.  Dr. Enrique Sack

## 2015-12-01 NOTE — Progress Notes (Signed)
Changed the dressing on pts tube feeding.  Small amount of green drainage on gauze.  Dr Whitney Muse notified.  Keflex TID called into her pharmacy.

## 2015-12-02 ENCOUNTER — Ambulatory Visit (HOSPITAL_COMMUNITY): Admission: RE | Admit: 2015-12-02 | Payer: Medicaid Other | Source: Ambulatory Visit | Admitting: Dentistry

## 2015-12-02 ENCOUNTER — Encounter (HOSPITAL_COMMUNITY): Payer: Medicaid Other | Attending: Cardiology

## 2015-12-02 ENCOUNTER — Inpatient Hospital Stay (HOSPITAL_COMMUNITY): Admission: RE | Admit: 2015-12-02 | Payer: Self-pay | Source: Ambulatory Visit

## 2015-12-02 ENCOUNTER — Encounter (HOSPITAL_COMMUNITY): Admission: RE | Payer: Self-pay | Source: Ambulatory Visit

## 2015-12-02 ENCOUNTER — Encounter (HOSPITAL_COMMUNITY): Admission: RE | Admit: 2015-12-02 | Payer: Medicaid Other | Source: Ambulatory Visit

## 2015-12-02 ENCOUNTER — Encounter (HOSPITAL_COMMUNITY): Payer: Medicaid Other

## 2015-12-02 SURGERY — MULTIPLE EXTRACTION WITH ALVEOLOPLASTY
Anesthesia: General

## 2015-12-03 ENCOUNTER — Encounter (HOSPITAL_COMMUNITY): Payer: Medicaid Other

## 2015-12-03 ENCOUNTER — Encounter (HOSPITAL_COMMUNITY): Payer: Self-pay

## 2015-12-03 ENCOUNTER — Ambulatory Visit (HOSPITAL_COMMUNITY): Payer: Self-pay

## 2015-12-06 ENCOUNTER — Other Ambulatory Visit: Payer: Self-pay | Admitting: Cardiology

## 2015-12-06 ENCOUNTER — Ambulatory Visit (HOSPITAL_COMMUNITY)
Admission: RE | Admit: 2015-12-06 | Discharge: 2015-12-06 | Disposition: A | Discharge status: Home / Self Care | Payer: Medicaid Other | Source: Ambulatory Visit | Attending: Cardiology | Admitting: Cardiology

## 2015-12-06 DIAGNOSIS — R079 Chest pain, unspecified: Secondary | ICD-10-CM

## 2015-12-06 DIAGNOSIS — I059 Rheumatic mitral valve disease, unspecified: Secondary | ICD-10-CM | POA: Diagnosis not present

## 2015-12-06 DIAGNOSIS — I5189 Other ill-defined heart diseases: Secondary | ICD-10-CM

## 2015-12-06 DIAGNOSIS — I071 Rheumatic tricuspid insufficiency: Secondary | ICD-10-CM | POA: Insufficient documentation

## 2015-12-06 DIAGNOSIS — I358 Other nonrheumatic aortic valve disorders: Secondary | ICD-10-CM | POA: Diagnosis not present

## 2015-12-06 DIAGNOSIS — Z720 Tobacco use: Secondary | ICD-10-CM | POA: Diagnosis not present

## 2015-12-06 DIAGNOSIS — C090 Malignant neoplasm of tonsillar fossa: Secondary | ICD-10-CM | POA: Diagnosis not present

## 2015-12-06 DIAGNOSIS — R222 Localized swelling, mass and lump, trunk: Secondary | ICD-10-CM | POA: Insufficient documentation

## 2015-12-06 DIAGNOSIS — I119 Hypertensive heart disease without heart failure: Secondary | ICD-10-CM | POA: Insufficient documentation

## 2015-12-06 LAB — ECHOCARDIOGRAM COMPLETE
CHL CUP DOP CALC LVOT VTI: 25.8 cm
CHL CUP TV REG PEAK VELOCITY: 235 cm/s
E/e' ratio: 5.38
EWDT: 232 ms
FS: 33 % (ref 28–44)
IVS/LV PW RATIO, ED: 1.31
LA ID, A-P, ES: 31 mm
LA diam end sys: 31 mm
LA vol A4C: 30.6 ml
LA vol: 33.2 mL
LADIAMINDEX: 2.11 cm/m2
LAVOLIN: 22.6 mL/m2
LV E/e' medial: 5.38
LV E/e'average: 5.38
LV PW d: 9.6 mm — AB (ref 0.6–1.1)
LV TDI E'MEDIAL: 8.05
LV dias vol: 41 mL — AB (ref 46–106)
LV e' LATERAL: 12.1 cm/s
LVDIAVOLIN: 28 mL/m2
LVOT area: 2.54 cm2
LVOT peak grad rest: 6 mmHg
LVOTD: 18 mm
LVOTPV: 125 cm/s
LVOTSV: 66 mL
Lateral S' vel: 10.6 cm/s
MV Dec: 232
MV pk A vel: 62.4 m/s
MV pk E vel: 65.1 m/s
RV TAPSE: 18.6 mm
RV sys press: 25 mmHg
TDI e' lateral: 12.1
TRMAXVEL: 235 cm/s

## 2015-12-06 NOTE — Progress Notes (Signed)
*  PRELIMINARY RESULTS* Echocardiogram 2D Echocardiogram has been performed.  Samuel Germany 12/06/2015, 1:50 PM

## 2015-12-07 ENCOUNTER — Encounter (HOSPITAL_COMMUNITY)
Admission: RE | Admit: 2015-12-07 | Discharge: 2015-12-07 | Disposition: A | Discharge status: Home / Self Care | Payer: Medicaid Other | Source: Ambulatory Visit | Attending: Cardiology | Admitting: Cardiology

## 2015-12-07 ENCOUNTER — Encounter (HOSPITAL_COMMUNITY): Payer: Self-pay

## 2015-12-07 ENCOUNTER — Telehealth: Payer: Self-pay

## 2015-12-07 DIAGNOSIS — R079 Chest pain, unspecified: Secondary | ICD-10-CM | POA: Diagnosis not present

## 2015-12-07 DIAGNOSIS — I510 Cardiac septal defect, acquired: Secondary | ICD-10-CM | POA: Diagnosis not present

## 2015-12-07 DIAGNOSIS — R931 Abnormal findings on diagnostic imaging of heart and coronary circulation: Secondary | ICD-10-CM | POA: Insufficient documentation

## 2015-12-07 HISTORY — DX: Unspecified asthma, uncomplicated: J45.909

## 2015-12-07 LAB — NM MYOCAR MULTI W/SPECT W/WALL MOTION / EF
CHL CUP NUCLEAR SDS: 2
CHL CUP NUCLEAR SRS: 3
CHL CUP NUCLEAR SSS: 5
CHL CUP RESTING HR STRESS: 76 {beats}/min
CSEPPHR: 99 {beats}/min
LV dias vol: 52 mL (ref 46–106)
LV sys vol: 15 mL
RATE: 0.1
TID: 1.05

## 2015-12-07 MED ORDER — REGADENOSON 0.4 MG/5ML IV SOLN
INTRAVENOUS | Status: AC
  Administered 2015-12-07: 0.4 mg via INTRAVENOUS
  Filled 2015-12-07: qty 5

## 2015-12-07 MED ORDER — SODIUM CHLORIDE 0.9% FLUSH
INTRAVENOUS | Status: AC
  Administered 2015-12-07: 10 mL via INTRAVENOUS
  Filled 2015-12-07: qty 10

## 2015-12-07 MED ORDER — TECHNETIUM TC 99M TETROFOSMIN IV KIT
30.0000 | PACK | Freq: Once | INTRAVENOUS | Status: AC | PRN
  Administered 2015-12-07: 31 via INTRAVENOUS

## 2015-12-07 MED ORDER — TECHNETIUM TC 99M TETROFOSMIN IV KIT
10.0000 | PACK | Freq: Once | INTRAVENOUS | Status: AC | PRN
  Administered 2015-12-07: 9.9 via INTRAVENOUS

## 2015-12-07 NOTE — Telephone Encounter (Signed)
Called pt, no answer, lmtcb.

## 2015-12-07 NOTE — Telephone Encounter (Signed)
-----   Message from Arnoldo Lenis, MD sent at 12/07/2015 12:28 PM EDT ----- Echo shows normal heart function. She does have an abnormal growth in her left atrium, this is most likely benign but in the near future we will need to do further testing on, most likely an additional ultrasound. We will discuss in detail at follow up  Molly Abts MD

## 2015-12-08 ENCOUNTER — Telehealth (HOSPITAL_COMMUNITY): Payer: Self-pay | Admitting: Dentistry

## 2015-12-08 ENCOUNTER — Ambulatory Visit (HOSPITAL_COMMUNITY): Payer: Self-pay

## 2015-12-08 ENCOUNTER — Telehealth: Payer: Self-pay | Admitting: *Deleted

## 2015-12-08 ENCOUNTER — Other Ambulatory Visit (HOSPITAL_COMMUNITY): Payer: Self-pay | Admitting: Dentistry

## 2015-12-08 NOTE — Telephone Encounter (Signed)
Stress test results and addended clinic note routed to Dr. Enrique Sack

## 2015-12-08 NOTE — Progress Notes (Signed)
Anesthesia Chart Review:  Pt is a 55 year old female scheduled for multiple dental extractions with alveoloplasty and gross debridement of teeth on 12/13/2015 with Teena Dunk, DDS.   Pt is a same day work up.   Surgery was originally scheduled for 11/24/15 but was postponed due to abnormal EKG. Pt saw Carlyle Dolly, MD with cardiology on 11/25/15; echo and stress test ordered, results below, and pt was cleared for surgery.   PMH includes:  HTN, atrial fibrillation (x1 in 2014, assoc with cocaine?, converted back to NSR), schizophrenia, COPD, emphysema, cancer of tonsillar fossa. Alcohol abuse (40oz per day). Cocaine abuse (last used 2 weeks ago). Current smoker. BMI 19.5  Anesthesia issues: Dr. Enrique Sack is concerned pt will have a difficult airway due to location of cancer. Notes from Ancil Linsey, MD, pt's oncologist, dated 11/04/15 document pt's oropharynx: "Glossal palatine arch is gone; ulcerative right tonsillar mass that extends throughout the right palate and the uvula and crosses midline"  Medications include: albuterol, amlodipine, augmentin, prilosec  Labs will be obtained DOS.     Chest x-ray 04/22/15 reviewed. COPD changes. No acute abnormalities.   EKG 11/22/15: NSR. Right atrial enlargement. T wave abnormality, consider anterolateral ischemia. Ischemia is new compared to EKG 04/22/15.   Nuclear stress test 12/07/15:   No clearly diagnostic ST segment changes to indicate ischemia, however progressive T-wave inversion noted anterolaterally with Lexiscan in the absence of chest pain.  Small, mild intensity, partially reversible basal anteroseptal defect that is suggestive of variable soft tissue attenuation, less likely ischemic zone but not completely excluded.  This is a low risk study.  Nuclear stress EF: 70%  Echo 12/06/15 - Left ventricle: The cavity size was normal. There was mild focal basal hypertrophy of the septum. Systolic function was normal. The estimated  ejection fraction was in the range of 60% to 65%. Wall motion was normal; there were no regional wall motion abnormalities. Left ventricular diastolic function parameters were normal. - Aortic valve: Mildly calcified annulus. Trileaflet. - Mitral valve: Mildly calcified leaflets . There was trivial regurgitation. - Right atrium: Central venous pressure (est): 3 mm Hg. - Tricuspid valve: There was trivial regurgitation. - Pulmonary arteries: PA peak pressure: 25 mm Hg (S). - Pericardium, extracardiac: There was no pericardial effusion. - Impressions: Mild basal septal LV hypertrophy with LVEF 60-65%. Normal diastolic function. Mildly calcified mitral leaflets with trivial mitral regurgitation. Trivial tricuspid regurgitation with PASP 25 mmHg. There is a rounded echodensity affixed to the left atrial side of the interatrial septum without obvious stalk. Structure measures approximately 17 mm x 17 mm. This is consistent with an atrial myxoma. This abnormality was described on the previous study in 2014 as well.  If labs acceptable DOS, I anticipate pt can proceed as scheduled.   Willeen Cass, FNP-BC [facility]/Anesthesiology Phone: [phone] 12/08/2015 4:04 PM

## 2015-12-08 NOTE — Telephone Encounter (Signed)
Dr. Enrique Sack ( in [facility])requesting clearance for pt to have oral surgery - pt did have stress test yesterday and echo completed. Will forward to Dr. Harl Bowie

## 2015-12-08 NOTE — Telephone Encounter (Signed)
12/08/2015  Patient:            Molly Campbell Date of Birth:  04-29-1961 MRN:                [id number]   I called the daughter, Valinda Party, and informed her that her mother had been cleared by cardiology to proceed with the oral surgery procedures in the operating room with general anesthesia. Patient is now scheduled for operating procedure with general anesthesia on 12/13/2015 at 7:30 AM at [facility].  Presurgical testing will call and confirm when patient is show up for the procedure. Patient is to be nothing by mouth after midnight. Patient should be able to be discharged to home pending any complications from the anesthesia or procedures. Patient to call if questions arise. Lenn Cal, DDS

## 2015-12-10 ENCOUNTER — Encounter (HOSPITAL_COMMUNITY): Payer: Self-pay | Admitting: *Deleted

## 2015-12-10 NOTE — Progress Notes (Signed)
Pt has cardiac clearance from Dr. Harl Bowie. She denies any chest pain or sob. Pt states it's been 2 weeks since she used cocaine. Instructed pt not to use any cocaine or marijuana prior to surgery. She voiced understanding.

## 2015-12-13 ENCOUNTER — Ambulatory Visit (HOSPITAL_COMMUNITY)
Admission: RE | Admit: 2015-12-13 | Discharge: 2015-12-13 | Disposition: A | Discharge status: Home / Self Care | Payer: Medicaid Other | Source: Ambulatory Visit | Attending: Dentistry | Admitting: Dentistry

## 2015-12-13 ENCOUNTER — Other Ambulatory Visit (HOSPITAL_COMMUNITY): Payer: Self-pay | Admitting: Emergency Medicine

## 2015-12-13 ENCOUNTER — Ambulatory Visit (HOSPITAL_COMMUNITY): Payer: Medicaid Other | Admitting: Vascular Surgery

## 2015-12-13 ENCOUNTER — Encounter (HOSPITAL_COMMUNITY)
Admission: RE | Disposition: A | Discharge status: Home / Self Care | Payer: Self-pay | Source: Ambulatory Visit | Attending: Dentistry

## 2015-12-13 DIAGNOSIS — K0530 Chronic periodontitis, unspecified: Secondary | ICD-10-CM | POA: Diagnosis not present

## 2015-12-13 DIAGNOSIS — Z8541 Personal history of malignant neoplasm of cervix uteri: Secondary | ICD-10-CM | POA: Insufficient documentation

## 2015-12-13 DIAGNOSIS — M2607 Excessive tuberosity of jaw: Secondary | ICD-10-CM | POA: Insufficient documentation

## 2015-12-13 DIAGNOSIS — Z79899 Other long term (current) drug therapy: Secondary | ICD-10-CM | POA: Insufficient documentation

## 2015-12-13 DIAGNOSIS — I10 Essential (primary) hypertension: Secondary | ICD-10-CM | POA: Insufficient documentation

## 2015-12-13 DIAGNOSIS — C090 Malignant neoplasm of tonsillar fossa: Secondary | ICD-10-CM | POA: Insufficient documentation

## 2015-12-13 DIAGNOSIS — K036 Deposits [accretions] on teeth: Secondary | ICD-10-CM | POA: Diagnosis not present

## 2015-12-13 DIAGNOSIS — K219 Gastro-esophageal reflux disease without esophagitis: Secondary | ICD-10-CM | POA: Diagnosis not present

## 2015-12-13 DIAGNOSIS — K0889 Other specified disorders of teeth and supporting structures: Secondary | ICD-10-CM

## 2015-12-13 DIAGNOSIS — J449 Chronic obstructive pulmonary disease, unspecified: Secondary | ICD-10-CM | POA: Insufficient documentation

## 2015-12-13 DIAGNOSIS — F1410 Cocaine abuse, uncomplicated: Secondary | ICD-10-CM | POA: Diagnosis not present

## 2015-12-13 DIAGNOSIS — F17210 Nicotine dependence, cigarettes, uncomplicated: Secondary | ICD-10-CM | POA: Insufficient documentation

## 2015-12-13 DIAGNOSIS — M264 Malocclusion, unspecified: Secondary | ICD-10-CM | POA: Insufficient documentation

## 2015-12-13 DIAGNOSIS — K029 Dental caries, unspecified: Secondary | ICD-10-CM | POA: Insufficient documentation

## 2015-12-13 DIAGNOSIS — K0401 Reversible pulpitis: Secondary | ICD-10-CM | POA: Diagnosis present

## 2015-12-13 DIAGNOSIS — F1010 Alcohol abuse, uncomplicated: Secondary | ICD-10-CM | POA: Diagnosis not present

## 2015-12-13 DIAGNOSIS — C099 Malignant neoplasm of tonsil, unspecified: Secondary | ICD-10-CM

## 2015-12-13 HISTORY — DX: Anemia, unspecified: D64.9

## 2015-12-13 HISTORY — DX: Other specified symptoms and signs involving the circulatory and respiratory systems: R09.89

## 2015-12-13 HISTORY — PX: MULTIPLE EXTRACTIONS WITH ALVEOLOPLASTY: SHX5342

## 2015-12-13 HISTORY — DX: Constipation, unspecified: K59.00

## 2015-12-13 LAB — CBC
HEMATOCRIT: 38.6 % (ref 36.0–46.0)
HEMOGLOBIN: 13 g/dL (ref 12.0–15.0)
MCH: 32.1 pg (ref 26.0–34.0)
MCHC: 33.7 g/dL (ref 30.0–36.0)
MCV: 95.3 fL (ref 78.0–100.0)
Platelets: 453 10*3/uL — ABNORMAL HIGH (ref 150–400)
RBC: 4.05 MIL/uL (ref 3.87–5.11)
RDW: 12.7 % (ref 11.5–15.5)
WBC: 5.6 10*3/uL (ref 4.0–10.5)

## 2015-12-13 LAB — BASIC METABOLIC PANEL
ANION GAP: 10 (ref 5–15)
BUN: 9 mg/dL (ref 6–20)
CO2: 22 mmol/L (ref 22–32)
Calcium: 9.1 mg/dL (ref 8.9–10.3)
Chloride: 106 mmol/L (ref 101–111)
Creatinine, Ser: 0.67 mg/dL (ref 0.44–1.00)
GFR calc Af Amer: >60 mL/min (ref >60)
GLUCOSE: 79 mg/dL (ref 65–99)
POTASSIUM: 3.6 mmol/L (ref 3.5–5.1)
SODIUM: 138 mmol/L (ref 135–145)

## 2015-12-13 SURGERY — MULTIPLE EXTRACTION WITH ALVEOLOPLASTY
Anesthesia: General

## 2015-12-13 MED ORDER — ONDANSETRON HCL 4 MG/2ML IJ SOLN: 4.0000 mg | Freq: Once | INTRAMUSCULAR | Status: DC | PRN

## 2015-12-13 MED ORDER — HYDROMORPHONE HCL 1 MG/ML IJ SOLN
INTRAMUSCULAR | Status: AC
  Filled 2015-12-13: qty 1

## 2015-12-13 MED ORDER — LIDOCAINE 2% (20 MG/ML) 5 ML SYRINGE
INTRAMUSCULAR | Status: AC
  Filled 2015-12-13: qty 5

## 2015-12-13 MED ORDER — PHENYLEPHRINE HCL 10 MG/ML IJ SOLN
INTRAMUSCULAR | Status: DC | PRN
  Administered 2015-12-13 (×7): 40 ug via INTRAVENOUS
  Administered 2015-12-13: 80 ug via INTRAVENOUS
  Administered 2015-12-13 (×2): 40 ug via INTRAVENOUS

## 2015-12-13 MED ORDER — SODIUM CHLORIDE 0.9 % IV SOLN: INTRAVENOUS | Status: AC

## 2015-12-13 MED ORDER — LIDOCAINE-EPINEPHRINE 2 %-1:100000 IJ SOLN
INTRAMUSCULAR | Status: AC
  Filled 2015-12-13: qty 10.2

## 2015-12-13 MED ORDER — FENTANYL CITRATE (PF) 100 MCG/2ML IJ SOLN
INTRAMUSCULAR | Status: DC | PRN
  Administered 2015-12-13: 100 ug via INTRAVENOUS
  Administered 2015-12-13 (×3): 50 ug via INTRAVENOUS

## 2015-12-13 MED ORDER — ROCURONIUM BROMIDE 10 MG/ML (PF) SYRINGE
PREFILLED_SYRINGE | INTRAVENOUS | Status: AC
  Filled 2015-12-13: qty 10

## 2015-12-13 MED ORDER — BUPIVACAINE-EPINEPHRINE (PF) 0.5% -1:200000 IJ SOLN
INTRAMUSCULAR | Status: AC
Start: 2015-12-13 — End: 2015-12-13
  Filled 2015-12-13: qty 3.6

## 2015-12-13 MED ORDER — HYDROMORPHONE HCL 1 MG/ML IJ SOLN: 0.2500 mg | INTRAMUSCULAR | Status: DC | PRN

## 2015-12-13 MED ORDER — HYDROMORPHONE HCL 1 MG/ML IJ SOLN
0.2500 mg | INTRAMUSCULAR | Status: DC | PRN
  Administered 2015-12-13 (×3): 0.5 mg via INTRAVENOUS

## 2015-12-13 MED ORDER — CEFAZOLIN SODIUM-DEXTROSE 2-4 GM/100ML-% IV SOLN
2.0000 g | Freq: Once | INTRAVENOUS | Status: AC
  Administered 2015-12-13: 2 g via INTRAVENOUS
  Filled 2015-12-13: qty 100

## 2015-12-13 MED ORDER — LACTATED RINGERS IV SOLN
INTRAVENOUS | Status: DC | PRN
  Administered 2015-12-13 (×2): via INTRAVENOUS

## 2015-12-13 MED ORDER — MIDAZOLAM HCL 5 MG/5ML IJ SOLN
INTRAMUSCULAR | Status: DC | PRN
  Administered 2015-12-13: 0.5 mg via INTRAVENOUS
  Administered 2015-12-13 (×2): 1 mg via INTRAVENOUS

## 2015-12-13 MED ORDER — ONDANSETRON HCL 4 MG/2ML IJ SOLN
INTRAMUSCULAR | Status: AC
  Filled 2015-12-13: qty 2

## 2015-12-13 MED ORDER — PROPOFOL 10 MG/ML IV BOLUS
INTRAVENOUS | Status: AC
  Filled 2015-12-13: qty 40

## 2015-12-13 MED ORDER — PROPOFOL 10 MG/ML IV BOLUS
INTRAVENOUS | Status: DC | PRN
  Administered 2015-12-13: 40 mg via INTRAVENOUS
  Administered 2015-12-13: 100 mg via INTRAVENOUS

## 2015-12-13 MED ORDER — OXYMETAZOLINE HCL 0.05 % NA SOLN
NASAL | Status: AC
  Filled 2015-12-13: qty 15

## 2015-12-13 MED ORDER — HYDROCODONE-ACETAMINOPHEN 5-325 MG PO TABS: 1.0000 | ORAL_TABLET | Freq: Four times a day (QID) | ORAL | 0 refills | Status: DC | PRN

## 2015-12-13 MED ORDER — BUPIVACAINE-EPINEPHRINE 0.5% -1:200000 IJ SOLN
INTRAMUSCULAR | Status: DC | PRN
  Administered 2015-12-13: 3.6 mL

## 2015-12-13 MED ORDER — HEMOSTATIC AGENTS (NO CHARGE) OPTIME
TOPICAL | Status: DC | PRN
  Administered 2015-12-13: 1 via TOPICAL

## 2015-12-13 MED ORDER — ONDANSETRON HCL 4 MG/2ML IJ SOLN
INTRAMUSCULAR | Status: DC | PRN
  Administered 2015-12-13: 4 mg via INTRAVENOUS

## 2015-12-13 MED ORDER — PHENYLEPHRINE 40 MCG/ML (10ML) SYRINGE FOR IV PUSH (FOR BLOOD PRESSURE SUPPORT)
PREFILLED_SYRINGE | INTRAVENOUS | Status: AC
  Filled 2015-12-13: qty 10

## 2015-12-13 MED ORDER — SUGAMMADEX SODIUM 200 MG/2ML IV SOLN
INTRAVENOUS | Status: AC
  Filled 2015-12-13: qty 2

## 2015-12-13 MED ORDER — LIDOCAINE-EPINEPHRINE 2 %-1:100000 IJ SOLN
INTRAMUSCULAR | Status: DC | PRN
  Administered 2015-12-13: 10.2 mL

## 2015-12-13 MED ORDER — OXYMETAZOLINE HCL 0.05 % NA SOLN
NASAL | Status: DC | PRN
  Administered 2015-12-13: 1

## 2015-12-13 MED ORDER — MIDAZOLAM HCL 2 MG/2ML IJ SOLN
INTRAMUSCULAR | Status: AC
  Filled 2015-12-13: qty 2

## 2015-12-13 MED ORDER — 0.9 % SODIUM CHLORIDE (POUR BTL) OPTIME
TOPICAL | Status: DC | PRN
  Administered 2015-12-13: 1000 mL

## 2015-12-13 MED ORDER — MEPERIDINE HCL 25 MG/ML IJ SOLN: 6.2500 mg | INTRAMUSCULAR | Status: DC | PRN

## 2015-12-13 MED ORDER — ROCURONIUM BROMIDE 100 MG/10ML IV SOLN
INTRAVENOUS | Status: DC | PRN
  Administered 2015-12-13: 5 mg via INTRAVENOUS
  Administered 2015-12-13: 25 mg via INTRAVENOUS

## 2015-12-13 MED ORDER — SUCCINYLCHOLINE CHLORIDE 200 MG/10ML IV SOSY
PREFILLED_SYRINGE | INTRAVENOUS | Status: AC
  Filled 2015-12-13: qty 10

## 2015-12-13 MED ORDER — FENTANYL CITRATE (PF) 250 MCG/5ML IJ SOLN
INTRAMUSCULAR | Status: AC
  Filled 2015-12-13: qty 5

## 2015-12-13 MED ORDER — LIDOCAINE HCL (CARDIAC) 20 MG/ML IV SOLN
INTRAVENOUS | Status: DC | PRN
  Administered 2015-12-13: 80 mg via INTRAVENOUS

## 2015-12-13 MED ORDER — SUGAMMADEX SODIUM 200 MG/2ML IV SOLN
INTRAVENOUS | Status: DC | PRN
Start: 2015-12-13 — End: 2015-12-13
  Administered 2015-12-13: 100 mg via INTRAVENOUS

## 2015-12-13 SURGICAL SUPPLY — 36 items
ALCOHOL 70% 16 OZ (MISCELLANEOUS) ×2 IMPLANT
ATTRACTOMAT 16X20 MAGNETIC DRP (DRAPES) ×2 IMPLANT
BLADE SURG 15 STRL LF DISP TIS (BLADE) ×2 IMPLANT
BLADE SURG 15 STRL SS (BLADE) ×4
COVER SURGICAL LIGHT HANDLE (MISCELLANEOUS) ×2 IMPLANT
GAUZE PACKING FOLDED 2  STR (GAUZE/BANDAGES/DRESSINGS) ×1
GAUZE PACKING FOLDED 2 STR (GAUZE/BANDAGES/DRESSINGS) ×1 IMPLANT
GAUZE SPONGE 4X4 16PLY XRAY LF (GAUZE/BANDAGES/DRESSINGS) ×2 IMPLANT
GLOVE BIOGEL PI IND STRL 6 (GLOVE) ×1 IMPLANT
GLOVE BIOGEL PI INDICATOR 6 (GLOVE) ×1
GLOVE SURG ORTHO 8.0 STRL STRW (GLOVE) ×2 IMPLANT
GLOVE SURG SS PI 6.0 STRL IVOR (GLOVE) ×2 IMPLANT
GOWN STRL REUS W/ TWL LRG LVL3 (GOWN DISPOSABLE) ×1 IMPLANT
GOWN STRL REUS W/TWL 2XL LVL3 (GOWN DISPOSABLE) ×2 IMPLANT
GOWN STRL REUS W/TWL LRG LVL3 (GOWN DISPOSABLE) ×2
HEMOSTAT SURGICEL 2X14 (HEMOSTASIS) ×2 IMPLANT
KIT BASIN OR (CUSTOM PROCEDURE TRAY) ×2 IMPLANT
KIT ROOM TURNOVER OR (KITS) ×2 IMPLANT
MANIFOLD NEPTUNE WASTE (CANNULA) ×2 IMPLANT
NDL BLUNT 16X1.5 OR ONLY (NEEDLE) ×1 IMPLANT
NEEDLE BLUNT 16X1.5 OR ONLY (NEEDLE) ×2 IMPLANT
NS IRRIG 1000ML POUR BTL (IV SOLUTION) ×2 IMPLANT
PACK EENT II TURBAN DRAPE (CUSTOM PROCEDURE TRAY) ×2 IMPLANT
PAD ARMBOARD 7.5X6 YLW CONV (MISCELLANEOUS) ×2 IMPLANT
SPONGE SURGIFOAM ABS GEL 100 (HEMOSTASIS) IMPLANT
SPONGE SURGIFOAM ABS GEL 12-7 (HEMOSTASIS) IMPLANT
SPONGE SURGIFOAM ABS GEL SZ50 (HEMOSTASIS) ×1 IMPLANT
SUCTION FRAZIER HANDLE 10FR (MISCELLANEOUS) ×1
SUCTION TUBE FRAZIER 10FR DISP (MISCELLANEOUS) ×1 IMPLANT
SUT CHROMIC 3 0 PS 2 (SUTURE) ×4 IMPLANT
SUT CHROMIC 4 0 P 3 18 (SUTURE) IMPLANT
SYR 50ML SLIP (SYRINGE) ×2 IMPLANT
TOWEL OR 17X26 10 PK STRL BLUE (TOWEL DISPOSABLE) ×2 IMPLANT
TUBE CONNECTING 12X1/4 (SUCTIONS) ×2 IMPLANT
WATER TABLETS ICX (MISCELLANEOUS) ×2 IMPLANT
YANKAUER SUCT BULB TIP NO VENT (SUCTIONS) ×2 IMPLANT

## 2015-12-13 NOTE — Progress Notes (Signed)
PRE-OPERATIVE NOTE:  12/13/2015 TYQUESHA MCCONATHY [id number]  VITALS: BP 111/84   Pulse 87   Temp 98.8 F (37.1 C) (Oral)   Resp 14   Wt 109 lb (49.4 kg)   SpO2 95%   BMI 19.94 kg/m   Lab Results  Component Value Date   WBC 5.7 11/18/2015   HGB 13.7 11/18/2015   HCT 39.5 11/18/2015   MCV 94.5 11/18/2015   PLT 552 (H) 11/18/2015   BMET    Component Value Date/Time   NA 140 11/18/2015 1315   K 4.6 11/18/2015 1315   CL 107 11/18/2015 1315   CO2 25 11/18/2015 1315   GLUCOSE 106 (H) 11/18/2015 1315   BUN 6 11/18/2015 1315   CREATININE 0.74 11/18/2015 1315   CALCIUM 9.2 11/18/2015 1315   GFRNONAA >60 11/18/2015 1315   GFRAA >60 11/18/2015 1315    Lab Results  Component Value Date   INR 0.98 11/18/2015   INR 0.96 03/14/2011   INR 0.94 09/08/2010   No results found for: PTT   Alease Frame presents for Multiple dental extractions with alveoloplasty and gross debridement of remaining dentition in the operating room with general anesthesia.    SUBJECTIVE: The patient denies any acute medical or dental changes and agrees to proceed with treatment as planned.  EXAM: No sign of acute dental changes.  ASSESSMENT: Patient is affected by history of acute pulpitis, dental caries, chronic periodontitis, malocclusion, and accretions .  PLAN: Patient agrees to proceed with treatment as planned in the operating room as previously discussed and accepts the risks, benefits, and complications of the proposed treatment. Patient is aware of the risk for bleeding, bruising, swelling, infection, pain, nerve damage, soft tissue damage, damage to adjacent teeth, sinus involvement, root tip fracture, mandible fracture, and the risks of complications associated with the anesthesia. Patient also is aware of the potential for other complications up to and including death due to overall respiratory cardiovascular compromise.    Lenn Cal, DDS

## 2015-12-13 NOTE — Op Note (Signed)
OPERATIVE REPORT  Patient:            Molly Campbell Date of Birth:  01/17/1961 MRN:                [id number]   DATE OF PROCEDURE:  12/13/2015  PREOPERATIVE DIAGNOSES: 1. Squamous cell carcinoma of the right tonsil 2. Pre-chemoradiation therapy dental protocol 3. History of acute pulpitis 4. Dental caries 5. Chronic periodontitis 6. Accretions 7. Malocclusion   POSTOPERATIVE DIAGNOSES: 1. Squamous cell carcinoma of the right tonsil 2. Pre-chemoradiation therapy dental protocol 3. History of acute pulpitis 4. Dental caries 5. Chronic periodontitis 6. Accretions 7. Malocclusion 8. Bilateral maxillary excessive tuberosities  OPERATIONS: 1. Multiple extraction of tooth numbers 2-6, 12-15, 17, and 18 2. 4 Quadrants of alveoloplasty 3. Gross debridement of remaining dentition 4. Bilateral maxillary tuberosity reductions   SURGEON: Charlynne Panderonald F. Sukhdeep Wieting, DDS  ASSISTANT: Rory PercyLisa Ingram, (dental assistant)  ANESTHESIA: General anesthesia via nasoendotracheal tube.  MEDICATIONS: 1. Ancef 2 g IV prior to invasive dental procedures. 2. Local anesthesia with a total utilization of 5 carpules each containing 34 mg of lidocaine with 0.017 mg of epinephrine as well as 1 carpules each containing 9 mg of bupivacaine with 0.009 mg of epinephrine.  SPECIMENS: There are 11 teeth that were discarded.  DRAINS: None  CULTURES: None  COMPLICATIONS: None   ESTIMATED BLOOD LOSS: 100 mLs.  INTRAVENOUS FLUIDS: 1700 mLs of Lactated ringers solution.  INDICATIONS: The patient was recently diagnosed with squamous cell carcinoma of the right tonsil..  A medically necessary dental consultation was then requested to evaluate poor dentition.  The patient was examined and treatment planned for multiple extractions with alveoloplasty and pre-prosthetic surgery as needed along with gross debridement of remaining dentition.  This treatment plan was formulated to decrease the risks and complications  associated with dental infection from affecting the patient's systemic health and to prevent future complications such as infection or osteoradionecrosis.  OPERATIVE FINDINGS: Patient was examined operating room number 10.  The teeth were identified for extraction. The patient was also noted to have bilateral excessive maxillary tuberosity reductions that would be reduced during the operating room procedure. The patient was noted be affected by chronic periodontitis, accretions, dental caries, history of acute pulpitis, bilateral excessive maxillary tuberosities, and malocclusion.   DESCRIPTION OF PROCEDURE: Patient was brought to the main operating room number 10. Patient was then placed in the supine position on the operating table. General anesthesia was then induced per the anesthesia team. The patient was then prepped and draped in the usual manner for dental medicine procedure. A timeout was performed. The patient was identified and procedures were verified. A throat pack was placed at this time. The oral cavity was then thoroughly examined with the findings noted above. The patient was then ready for dental medicine procedure as follows:  Local anesthesia was then administered sequentially with a total utilization of 5 carpules each containing 34 mg of lidocaine with 0.017 mg of epinephrine as well as 1 carpules  each containing 9 mg bupivacaine with 0.009 mg of epinephrine.  The Maxillary left and right quadrants first approached. Anesthesia was then delivered utilizing infiltration with lidocaine with epinephrine. A #15 blade incision was then made from the maxillary right tuberosity and extended to the mesial of #8.  A  surgical flap was then carefully reflected. Tooth numbers 2 through 6 then subluxated with a series of straight elevators.  Appropriate amounts of buccal and interseptal bone were then removed utilizing  a surgical handpiece and bur and copious amounts of sterile water around  tooth numbers 2 through 6.  The teeth were then again subluxated with a series of straight elevators. Tooth numbers 2 and 3 were then removed with a 53R forceps without complications. Tooth numbers 4, 5, and 6 were then removed with a 150 forceps without complications. Alveoloplasty was then performed utilizing a ronguers and bone file to achieve primary closure. At this point time a maxillary right tuberosity reduction was achieved utilizing a series of 15 blade to remove the redundant tissue. The surgical site was then irrigated with copious amounts of sterile saline. The tissues were again approximated and trimmed appropriately. A piece of Surgifoam was then placed in the extraction socket area numbers 2 through 3.  The surgical site was then closed from the maxillary right tuberosity and extended to the mesial of #8 utilizing 3-0 chromic gut suture in a continuous interrupted suture technique 1.  The maxillary left surgical site was then approached. A 15 blade incision was then made from the maxillary left tuberosity and extended to the mesial of #10.  A surgical flap was then carefully reflected. Appropriate amounts of buccal and interseptal bone was then removed with surgical handpiece and bur and copious amounts sterile water around tooth numbers 12-15 .  The maxillary left teeth were then subluxated with a series of straight elevators. Tooth numbers 12 and 13 were then removed with a 150 forceps without complications. Tooth numbers 14 and 15 were then removed with a 53R forceps without complications. Alveoloplasty was then performed utilizing a rongeurs and bone file to achieve primary closure. At this point time the maxillary left tuberosity reduction was achieved utilizing a series of 15 blade incisions and removal of the redundant tissue. The tissues were then approximated and trimmed appropriately. The surgical site was then irrigated with copious amounts sterile saline. Piece of Surgifoam was then  placed in the extraction socket area numbers 14 and 15. Maxillary left surgical site was then closed from the maxillary left tuberosity and extended the mesial of #10 utilizing 3-0 chromic gut suture in a continuous interrupted suture technique 1.   At this point time, the mandibular quadrants were approached. The patient was given bilateral inferior alveolar nerve blocks and long buccal nerve blocks utilizing the bupivacaine with epinephrine. Further infiltration was then achieved utilizing the lidocaine with epinephrine. A 15 blade incision was then made from the distal of number 17 and extended to the mesial of #20.  A surgical flap was then carefully reflected. Appropriate amounts of buccal and interseptal bone were then removed utilizing a surgical handpiece and copious amount of sterile water around tooth numbers 17 and 18.  Tooth numbers 17 and 18 was then subluxated with a series of straight elevators. The coronal aspect of tooth numbers 17 and 18 were then removed with a 17 forceps leaving the roots remaining. Further bone was then removed around the roots utilizing a surgical handpiece and bur and copious amounts sterile water. The roots were then elevated out with a series of cryers elevators without further complication. The mesial root area of #17 was then further evaluated for questionable retained roots noted on the dental x-ray, but none were noted. Alveoloplasty was then performed utilizing a rongeurs and bone file to achieve primary closure. The tissues were approximated and trimmed appropriately. The surgical sites were then irrigated with copious amounts of sterile saline.  A piece of Surgifoam was placed in the extraction sockets area  numbers 17 and 18.  The mandibular left surgical site was then closed from the distal of  17 and extended to the distal of #20 utilizing 3-0 chromic gut suture in a continuous interrupted suture technique 1. An interproximal suture was placed between tooth  numbers 20 and 21 with 3-0 chromic gut material.  At this point time, the remaining dentition was approached. A KaVo sonic scaler was then used to remove accretions. A series of hand curettes were then utilized to further remove accretions. A sonic scaler was then again used to further refine removal of accretions.  At this point time, the entire mouth was irrigated with copious amounts of sterile saline. The patient was examined for complications, seeing none, the dental medicine procedure was deemed to be complete. The throat pack was removed at this time. An oral airway was then placed at the request of the anesthesia team. A series of 4 x 4 gauze were placed in the mouth to aid hemostasis. The patient was then handed over to the anesthesia team for final disposition. After an appropriate amount of time, the patient was extubated and taken to the postanesthsia care unit in good condition. All counts were correct for the dental medicine procedure. The patient will be scheduled for suture removal in approximately 7-10 days. Dr. Whitney Muse is to proceed with induction chemotherapy at her discretion.   Lenn Cal, DDS.

## 2015-12-13 NOTE — Transfer of Care (Signed)
Immediate Anesthesia Transfer of Care Note  Patient: KHALI STARKOVICH  Procedure(s) Performed: Procedure(s): Extraction of tooth #'s 2,3,4,5,6,12,13,14,15,17,and 18 with alveoloplasty, bilateral maxillary tuberosity reductions, and gross debridement of remaining teeth (N/A)  Patient Location: PACU  Anesthesia Type:General  Level of Consciousness: awake, alert  and patient cooperative  Airway & Oxygen Therapy: Patient Spontanous Breathing and Patient connected to face mask oxygen  Post-op Assessment: Report given to RN, Post -op Vital signs reviewed and stable and Patient moving all extremities  Post vital signs: Reviewed and stable  Last Vitals:  Vitals:   12/13/15 0621 12/13/15 0953  BP: 111/84   Pulse: 87 97  Resp: 14 (!) 45  Temp: 37.1 C 36.4 C    Last Pain:  Vitals:   12/13/15 0621  TempSrc: Oral         Complications: No apparent anesthesia complications

## 2015-12-13 NOTE — Anesthesia Preprocedure Evaluation (Addendum)
Anesthesia Evaluation  Patient identified by MRN, date of birth, ID band Patient awake    Reviewed: Allergy & Precautions, NPO status , Patient's Chart, lab work & pertinent test results, reviewed documented beta blocker date and time   Airway Mallampati: II  TM Distance: >3 FB Neck ROM: Full    Dental  (+) Partial Upper, Partial Lower, Dental Advisory Given   Pulmonary COPD, Current Smoker,    Pulmonary exam normal        Cardiovascular hypertension, Pt. on medications Normal cardiovascular exam     Neuro/Psych    GI/Hepatic GERD  Medicated and Controlled,(+)     substance abuse  cocaine use,   Endo/Other    Renal/GU      Musculoskeletal   Abdominal   Peds  Hematology   Anesthesia Other Findings   Reproductive/Obstetrics                            Anesthesia Physical Anesthesia Plan  ASA: III  Anesthesia Plan: General   Post-op Pain Management:    Induction: Intravenous  Airway Management Planned: Nasal ETT  Additional Equipment:   Intra-op Plan:   Post-operative Plan: Extubation in OR  Informed Consent: I have reviewed the patients History and Physical, chart, labs and discussed the procedure including the risks, benefits and alternatives for the proposed anesthesia with the patient or authorized representative who has indicated his/her understanding and acceptance.     Plan Discussed with: CRNA and Surgeon  Anesthesia Plan Comments:         Anesthesia Quick Evaluation

## 2015-12-13 NOTE — Discharge Instructions (Signed)

## 2015-12-13 NOTE — Anesthesia Procedure Notes (Signed)
Procedure Name: Intubation Date/Time: 12/13/2015 7:44 AM Performed by: Terrill Mohr Pre-anesthesia Checklist: Patient identified, Emergency Drugs available, Suction available and Patient being monitored Patient Re-evaluated:Patient Re-evaluated prior to inductionOxygen Delivery Method: Circle system utilized Preoxygenation: Pre-oxygenation with 100% oxygen Intubation Type: IV induction Ventilation: Mask ventilation without difficulty Laryngoscope Size: Mac and 3 Grade View: Grade I Nasal Tubes: Right, Magill forceps- large, utilized and Nasal prep performed Tube size: 7.0 mm Number of attempts: 1 Placement Confirmation: ETT inserted through vocal cords under direct vision,  CO2 detector and breath sounds checked- equal and bilateral Tube secured with: Tape (taped across nose for surgeon; forhead padded; corrugated connector used) Dental Injury: Teeth and Oropharynx as per pre-operative assessment and Bloody posterior oropharynx

## 2015-12-13 NOTE — Anesthesia Postprocedure Evaluation (Signed)
Anesthesia Post Note  Patient: KATIEJO ROHRS  Procedure(s) Performed: Procedure(s) (LRB): Extraction of tooth #'s 2,3,4,5,6,12,13,14,15,17,and 18 with alveoloplasty, bilateral maxillary tuberosity reductions, and gross debridement of remaining teeth (N/A)  Patient location during evaluation: PACU Anesthesia Type: General Level of consciousness: awake and alert Pain management: pain level controlled Vital Signs Assessment: post-procedure vital signs reviewed and stable Respiratory status: spontaneous breathing, nonlabored ventilation, respiratory function stable and patient connected to nasal cannula oxygen Cardiovascular status: blood pressure returned to baseline and stable Postop Assessment: no signs of nausea or vomiting Anesthetic complications: no    Last Vitals:  Vitals:   12/13/15 1218 12/13/15 1230  BP: (!) 149/89 (!) 149/89  Pulse: 67 68  Resp: 14 14  Temp:      Last Pain:  Vitals:   12/13/15 1145  TempSrc:   PainSc: Asleep                 Jaymarie Yeakel DAVID

## 2015-12-13 NOTE — H&P (Signed)
12/13/2015  Patient:            Molly Campbell Date of Birth:  [birth year] MRN:                [id number]   BP 111/84   Pulse 87   Temp 98.8 F (37.1 C) (Oral)   Resp 14   Wt 109 lb (49.4 kg)   SpO2 95%   BMI 19.94 kg/m    Molly Campbell 55 year old female with squamous cell carcinoma of the right tonsil with anticipated induction chemotherapy followed by radiation therapy. Patient now presents for multiple extractions with alveoloplasty and gross debridement of remaining dentition the operating room with general anesthesia. Please see note from Dr. Harl Bowie dated 11/25/2015 to act as H&P for dental operating room procedure. Cardiac clearance also provided by Dr. Harl Bowie per addendum note. Patient denies any acute medical or dental changes.  Molly Campbell, DDS  Progress Notes Encounter Date: 11/25/2015 Molly Lenis, MD  Cardiology        Clinical Summary Ms. Deveaux is a 55 y.o.female seen today as a new patient, she is referred by Dr Whitney Muse  1. Abnormal EKG/Preoperative evaluation/Chest pain - being considered for dental surgery with mutilple extractions - in preop evaluation noted to have abnormal EKG with new diffuse precordial TWIs that were new - echo 2014 with LVEF 65-70%, no WMAs, moderate LVH. ?myxoma  - can have some chest pain. Off and on for year. Pressure like pain, left side. Can be severe. +SOB, feels hot/sweaty. Lasts about 30 mintues. Occurs 1-2 times a month   2. Afib - isolated episode, apparently related to [facility] abuse at the time. No documented recurrences, no recent palpitations   3. 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 Does not appear she had f/u imaging done.        Past Medical History  Diagnosis Date  . Hypertension   . Tobacco abuse   . Alcohol abuse   . Cocaine abuse   . Alcoholic hepatitis 99991111  . Auditory hallucinations   .  Anxiety disorder   . Schizophrenia ([place])   . COPD (chronic obstructive pulmonary disease) ([place])   . Emphysema   . Chronic bronchitis with acute exacerbation ([place])   . History of cervical cancer 09/26/2013  . GERD (gastroesophageal reflux disease)   . Pneumonia     hx  . Cancer ([facility])     cervical  . Scoliosis   . Chronic back pain   . Malignant neoplasm of tonsillar fossa ([place]) 11/12/2015  . Dysrhythmia 2012    hx AF in er-cocaine-converted back NSR  . Depression   . Headache           Allergies  Allergen Reactions  . Benicar [Olmesartan] Other (See Comments)    Tachycardia - she was told to never take it again  . Ibuprofen Palpitations    tachycardia           Current Outpatient Prescriptions  Medication Sig Dispense Refill  . albuterol (PROVENTIL HFA;VENTOLIN HFA) 108 (90 BASE) MCG/ACT inhaler Inhale 1-2 puffs into the lungs every 6 (six) hours as needed for wheezing or shortness of breath. 1 Inhaler 0  . amLODipine (NORVASC) 5 MG tablet Take 5 mg by mouth daily.    Marland Kitchen HYDROcodone-acetaminophen (NORCO/VICODIN) 5-325 MG tablet Take 1 tablet by mouth every 4 (four) hours as needed for moderate pain. 90 tablet 0  .  lidocaine (XYLOCAINE) 2 % solution Mix 1 part 2%viscous lidocaine,1part H2O.Swish and swallow 31mL of this mixture,61min before meals and at bedtime, up to QID (Patient taking differently: Use as directed 5 mLs in the mouth or throat See admin instructions. Mix 1 part 2% viscous lidocaine with 1part water.Swish and swallow 64mL of this mixture,59min before meals and at bedtime, up to QID) 100 mL 5  . Multiple Vitamins-Minerals (MULTIVITAMIN) tablet Take 1 tablet by mouth daily. 180 tablet 1  . nicotine (NICODERM CQ - DOSED IN MG/24 HOURS) 14 mg/24hr patch Place 1 patch (14 mg total) onto the skin daily. Apply 21 mg patch daily x 6 wk, then 14mg  patch daily x 2 wk, then 7 mg patch daily x 2 wk 14 patch 0  . nicotine (NICODERM CQ - DOSED IN  MG/24 HOURS) 21 mg/24hr patch Place 1 patch (21 mg total) onto the skin daily. Apply 21 mg patch daily x 6 wk, then 14mg  patch daily x 2 wk, then 7 mg patch daily x 2 wk (Patient not taking: Reported on 11/17/2015) 14 patch 2  . nicotine (NICODERM CQ - DOSED IN MG/24 HR) 7 mg/24hr patch Place 1 patch (7 mg total) onto the skin daily. Apply 21 mg patch daily x 6 wk, then 14mg  patch daily x 2 wk, then 7 mg patch daily x 2 wk (Patient not taking: Reported on 11/17/2015) 14 patch 0  . Nutritional Supplements (FEEDING SUPPLEMENT, OSMOLITE 1.5 Campbell,) LIQD 5 cans via PEG, split into 3 meals of 1.5, 1.5 and 2 cans. Flush with 150 cc water before and after each feed (Patient taking differently: by PEG Tube route See admin instructions. 5 cans via PEG, split into 3 meals of 1.5, 1.5 and 2 cans. Flush with 150 cc water before and after each feed)  0  . omeprazole (PRILOSEC) 20 MG capsule 1 PO EVERY MORNING 31 capsule 11  . oxyCODONE-acetaminophen (ROXICET) 5-325 MG tablet Take 1 tablet by mouth every 4 (four) hours as needed. 20 tablet 0  . polyethylene glycol powder (GLYCOLAX/MIRALAX) powder Take one capsule and mix in 4-6 ounces of water, take one to two times daily as needed for constipation. (Patient taking differently: Take 17 g by mouth 2 (two) times daily as needed (constipation). Take one capful and mix in 4-6 ounces of water, take one to two times daily as needed for constipation.) 527 g 3  . thiamine (VITAMIN B-1) 100 MG tablet Take 1 tablet (100 mg total) by mouth daily. 180 tablet 1   No current facility-administered medications for this visit.           Past Surgical History  Procedure Laterality Date  . Abdominal hysterectomy    . Colonoscopy  05/07/2002    Dr.Demason- large approximately 1.5cm polyp pedunculated aproximately 10cm from the anal verge bx= adenomatous polyp  . Esophagogastroduodenoscopy  06/02/2002    Dr. Anthony Sar- mild distal esophagitis w/o stricturing or  ulceration. stomach and pylorus are normal. inflammatory changes of proximal duldenum in the first portion.   . Fracture surgery  right wrist      . Breast biopsy  2005    lt bx  . Direct laryngoscopy N/A 10/25/2015    Procedure: DIRECT LARYNGOSCOPY WITH BIOPSY OF ORAL PHARYNGEAL LESION;  Surgeon: Leta Baptist, MD;  Location: [facility];  Service: ENT;  Laterality: N/A;  DIRECT LARYNGOSCOPY WITH BIOPSY OF ORAL PHARYNGEAL LESION  . Portacath placement Right 11/18/15  . Peg placement  11/18/15  Allergies  Allergen Reactions  . Benicar [Olmesartan] Other (See Comments)    Tachycardia - she was told to never take it again  . Ibuprofen Palpitations    tachycardia            Family History  Problem Relation Age of Onset  . Heart failure Other   . Alzheimer's disease Mother   . Other Father     "he was beat to death"  . Heart disease Sister   . Gout Sister   . COPD Sister   . Seizures Sister   . Diabetes Sister   . Diabetes Brother   . Cancer Paternal Grandmother     not sure what kind  . Seizures Sister   . Other Sister     "bones are messed up"  . Hypertension Brother   . Heart attack Brother   . Other Brother     MVA  . Diabetes Brother   . Other Brother     MVA  . Alcohol abuse Daughter   . Other Son     motorcycle accident     Social History Ms. Fournier reports that she has been smoking Cigarettes.  She has a 48 pack-year smoking history. She has quit using smokeless tobacco. Her smokeless tobacco use included Snuff. Ms. Sinclair reports that she drinks alcohol.   Review of Systems CONSTITUTIONAL: No weight loss, fever, chills, weakness or fatigue.  HEENT: Eyes: No visual loss, blurred vision, double vision or yellow sclerae.No hearing loss, sneezing, congestion, runny nose or sore throat.  SKIN: No rash or itching.  CARDIOVASCULAR: per HPI RESPIRATORY: No shortness of  breath, cough or sputum.  GASTROINTESTINAL: No anorexia, nausea, vomiting or diarrhea. No abdominal pain or blood.  GENITOURINARY: No burning on urination, no polyuria NEUROLOGICAL: No headache, dizziness, syncope, paralysis, ataxia, numbness or tingling in the extremities. No change in bowel or bladder control.  MUSCULOSKELETAL: No muscle, back pain, joint pain or stiffness.  LYMPHATICS: No enlarged nodes. No history of splenectomy.  PSYCHIATRIC: No history of depression or anxiety.  ENDOCRINOLOGIC: No reports of sweating, cold or heat intolerance. No polyuria or polydipsia.  Marland Kitchen   Physical Examination    Filed Vitals:   11/25/15 1458  BP: 118/70  Pulse: 101      Filed Vitals:   11/25/15 1458  Height: 5\' 2"  (1.575 m)  Weight: 109 lb (49.442 kg)    Gen: resting comfortably, no acute distress HEENT: no scleral icterus, pupils equal round and reactive, no palptable cervical adenopathy,  CV: RRR, no m/r/g, no jvd Resp: Clear to auscultation bilaterally GI: abdomen is soft, non-tender, non-distended, normal bowel sounds, no hepatosplenomegaly MSK: extremities are warm, no edema.  Skin: warm, no rash Neuro:  no focal deficits Psych: appropriate affect   Diagnostic Studies  01/2013 Echo Study Conclusions  - Left ventricle: The cavity size was normal. There was moderate concentric hypertrophy. Systolic function was vigorous. The estimated ejection fraction was in the range of 65% to 70%. Wall motion was normal; there were no regional wall motion abnormalities. Left ventricular diastolic function parameters were normal. - Mitral valve: Mildly thickened leaflets. Mild regurgitation. - Right atrium: Central venous pressure: 4mm Hg (est). - Atrial septum: Rounded echodensity on left atrialside of septum, approximately 31mm x 17mm, suggestive of myxoma. No stalk seen. Not very mobile. - Tricuspid valve: Trivial regurgitation. - Pulmonary arteries: PA  peak pressure: 89mm Hg (S). - Pericardium, extracardiac: There was no pericardial effusion. Impressions:  - No prior study  for comparison. Moderate LVH with LVEF [id number], normal diastolic function. Rounded echodensity on left atrial side of septum, approximately 63mm x 36mm, suggestive of myxoma. No stalk seen. Not very mobile.   Assessment and Plan  1. Chest pain - abnormal EKG, dynamic precordial TWIs by EKG with recent chest pain symptoms. Noted TWIs on preop EKG not present on today's EKG.  - we will obtain a nuclear stress to further assess  2. 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    3. Afib - appears to have been isolated episode in setting of substance abuse, conitnue to monitor for recurrence. She has not been committed to anticoagulation at this point.    Molly Campbell, M.D.  12/08/15 Addendum Echo shows normal LVEF and wall motion. Nuclear stress without clear ischemia, overall low risk. From cardiac standpont recommend proceeding with oral surgery as planned.   Zandra Abts MD

## 2015-12-14 ENCOUNTER — Other Ambulatory Visit (HOSPITAL_COMMUNITY): Payer: Self-pay | Admitting: Emergency Medicine

## 2015-12-14 ENCOUNTER — Encounter (HOSPITAL_COMMUNITY): Payer: Self-pay | Admitting: Dentistry

## 2015-12-14 DIAGNOSIS — C090 Malignant neoplasm of tonsillar fossa: Secondary | ICD-10-CM

## 2015-12-14 MED ORDER — ONDANSETRON HCL 8 MG PO TABS: 8.0000 mg | ORAL_TABLET | Freq: Two times a day (BID) | ORAL | 1 refills | Status: DC | PRN

## 2015-12-14 MED ORDER — PROCHLORPERAZINE MALEATE 10 MG PO TABS: 10.0000 mg | ORAL_TABLET | Freq: Four times a day (QID) | ORAL | 1 refills | Status: DC | PRN

## 2015-12-14 MED ORDER — DEXAMETHASONE 4 MG PO TABS: ORAL_TABLET | ORAL | 1 refills | Status: DC

## 2015-12-14 MED ORDER — LIDOCAINE-PRILOCAINE 2.5-2.5 % EX CREA: TOPICAL_CREAM | CUTANEOUS | 3 refills | Status: DC

## 2015-12-15 ENCOUNTER — Encounter (HOSPITAL_COMMUNITY): Payer: Medicaid Other | Attending: Oncology | Admitting: Oncology

## 2015-12-15 ENCOUNTER — Encounter: Payer: Self-pay | Admitting: Dietician

## 2015-12-15 ENCOUNTER — Encounter (HOSPITAL_COMMUNITY): Payer: Self-pay

## 2015-12-15 ENCOUNTER — Telehealth: Payer: Self-pay | Admitting: *Deleted

## 2015-12-15 ENCOUNTER — Ambulatory Visit (HOSPITAL_COMMUNITY): Payer: Self-pay

## 2015-12-15 VITALS — BP 113/81 | HR 88 | Resp 18 | Wt 109.9 lb

## 2015-12-15 DIAGNOSIS — Z789 Other specified health status: Secondary | ICD-10-CM | POA: Insufficient documentation

## 2015-12-15 DIAGNOSIS — R1900 Intra-abdominal and pelvic swelling, mass and lump, unspecified site: Secondary | ICD-10-CM | POA: Diagnosis not present

## 2015-12-15 DIAGNOSIS — F1010 Alcohol abuse, uncomplicated: Secondary | ICD-10-CM

## 2015-12-15 DIAGNOSIS — Z720 Tobacco use: Secondary | ICD-10-CM | POA: Insufficient documentation

## 2015-12-15 DIAGNOSIS — J449 Chronic obstructive pulmonary disease, unspecified: Secondary | ICD-10-CM

## 2015-12-15 DIAGNOSIS — K7010 Alcoholic hepatitis without ascites: Secondary | ICD-10-CM | POA: Diagnosis not present

## 2015-12-15 DIAGNOSIS — C090 Malignant neoplasm of tonsillar fossa: Secondary | ICD-10-CM

## 2015-12-15 DIAGNOSIS — F79 Unspecified intellectual disabilities: Secondary | ICD-10-CM | POA: Diagnosis not present

## 2015-12-15 DIAGNOSIS — Z931 Gastrostomy status: Secondary | ICD-10-CM

## 2015-12-15 DIAGNOSIS — C099 Malignant neoplasm of tonsil, unspecified: Secondary | ICD-10-CM

## 2015-12-15 DIAGNOSIS — Z8541 Personal history of malignant neoplasm of cervix uteri: Secondary | ICD-10-CM

## 2015-12-15 DIAGNOSIS — R918 Other nonspecific abnormal finding of lung field: Secondary | ICD-10-CM | POA: Diagnosis not present

## 2015-12-15 DIAGNOSIS — Z95828 Presence of other vascular implants and grafts: Secondary | ICD-10-CM

## 2015-12-15 DIAGNOSIS — D473 Essential (hemorrhagic) thrombocythemia: Secondary | ICD-10-CM | POA: Insufficient documentation

## 2015-12-15 DIAGNOSIS — J439 Emphysema, unspecified: Secondary | ICD-10-CM | POA: Insufficient documentation

## 2015-12-15 DIAGNOSIS — E876 Hypokalemia: Secondary | ICD-10-CM | POA: Insufficient documentation

## 2015-12-15 DIAGNOSIS — C49A2 Gastrointestinal stromal tumor of stomach: Secondary | ICD-10-CM | POA: Insufficient documentation

## 2015-12-15 DIAGNOSIS — Z7289 Other problems related to lifestyle: Secondary | ICD-10-CM | POA: Insufficient documentation

## 2015-12-15 DIAGNOSIS — F1410 Cocaine abuse, uncomplicated: Secondary | ICD-10-CM

## 2015-12-15 NOTE — Progress Notes (Signed)
Follow up with this high risk H&N patient.   55 y/o female PMHx with lost of significant co morbidities: Very significant Tobacco abuse (48 pack year), severe etoh abuse (6 pack or 40 oz daily), illicit drug abuse, HTN, alcoholic hepatitis, COPD, Cervical cancer, GERD, anxiety, schizophrenia, mental retardation (2014) Esophageal Dysphagia (2015)  Contacted Pt by visiting during office visit  Wt Readings from Last 10 Encounters:  12/15/15 109 lb 14.4 oz (49.9 kg)  12/13/15 109 lb (49.4 kg)  11/26/15 109 lb (49.4 kg)  11/25/15 109 lb (49.4 kg)  11/23/15 107 lb (48.5 kg)  11/22/15 107 lb 4.8 oz (48.7 kg)  11/22/15 107 lb (48.5 kg)  11/22/15 106 lb 3.2 oz (48.2 kg)  11/19/15 106 lb 9.6 oz (48.4 kg)  11/18/15 107 lb 12.8 oz (48.9 kg)  Pts weight is stable despite a very questionable feeding regimen  Subjectively, pt is more confused today then when first seen. She is much more difficult to understand and ability to comprehend seems worse.  She, frankly, has demeanor of one who is intoxicated. Though she may have been under affect of medications.   Her daughter, who was present, did much interpretation of the pt's statements and provided much of the information.   Prior to speaking with patient, RD asked daughter if she believed pt was using the TF/PEG. Daughter reports that initially, pt was drinking the Tube feeding solution instead of placing through her tube because "she doesn't like to mess with the tube". When daughter learned of this, she states she educated her mother on how to use the PEG again and made sure she understood how to use this. Despite this, the daughter is not sure if the pt is still drinking the TF or placing it through tube, noting that the pt lives on her own at home.   Pt, herself reports she is currently using two cans of TF daily as well as drinking Ensure. She asked for another case of ensure. RD asked if she was drinking the TF or placing through the tube. Her answer  was essentially both. In fact, she refers to the ensure as 'the good tasting one" and the osmolite as the "bad tasting one", further asserting that patient is drinking her TF formula.   She had her teeth pulled Monday and her PO intake is worse. She reports eating mainly ice cream. She notes that "pink stuff" backs up into tube. This is believed to be the Ensure she is drinking orally.   Her reported TF regimen is as follows. She states she will "do 50 of water than the food and then a half bottle of water" which she quantifies as 8 ounces. She does this 2x a day. Additionally, the pt will place an entire 16.9 oz bottle of water through her tube at some point during the day.   RD spent a large amount of time explaining the importance of becoming familiar and comfortable with her tube because once she starts therapy, her ability to tolerate PO intake will likely be severely limited. RD asked her to increase her TF via peg to 3 cans/day. In order to do this without increasing the amount of time she would be "messing" with the tube, asked her to do two feeds of 1.5 cans each.  She seemed to think she would not tolerate this. RD stated she does not need to flush with 8 oz after each feed, she can just use her "50". This will save room in her  stomach. Pt misinterpreted this as switching all her water flushes, for TF. Had to reiterate BOTH are needed.   If she would do the 1.5 cans, 2x a day, Patient would be refrigerating the TF and letting it sit 10 min before using at the next feed. This process seemed to overwhelm her. Explained it may be best just to do 3 meals, 1 can each.   RD stated that if she currently is not using the PEG at all, she should just do ask much as she feels comfortable.   Pt has extremely poor health literacy and RD continues to have major reservations about her being able to administer her own TF and take care of herself nutritionally.   RD will follow up at her upcoming chemo  session and will keep attempting to educate. Will order Ensure  Burtis Junes RD, LDN, [facility] Pager: [phone] 12/15/2015 1:34 PM

## 2015-12-15 NOTE — Telephone Encounter (Signed)
-----   Message from Arnoldo Lenis, MD sent at 12/08/2015 11:56 AM EDT ----- Stress test overall looks good, she is ok for surgery. SHe needs to see me back in 6 weeks  Zandra Abts MD

## 2015-12-15 NOTE — Progress Notes (Signed)
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     10/27/2015 Pathology Results    invasive squamous cell carcinoma, p16 NEGATIVE     11/10/2015 PET scan    Bilateral palatine tonsil enlargement and hypermetabolism, with asymmetric hypermetabolism in the right palatine tonsil, consistent with primary malignancy in this location. Bilateral mildly enlarged hypermetabolic level 2 neck lymph nodes.     11/18/2015 Procedure    IR Gastrostomy Tube, fluoroscopic 20 French Pull through Gastrostomy       HISTORY OF PRESENTING ILLNESS: Molly Campbell 55 y.o. 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 I have spoken directly with Dr Isidore Moos now, confirming that plan is to begin treatment with single agent CDDP, as there is concern that she would likely not be able to tolerate concomitant chemo and radiation at least initially. 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 She had power PAC placed on 11-18-15 by IR. She and daughter have attended chemotherapy education class, with first CDDP scheduled for 12-22-15. She has a known history of ongoing tobacco abuse, ongoing ETOH abuse, and illicit drug use. Due to concerns about compliance and tolerance, she is scheduled for IVF on days in addition to chemo.   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 Pathology [phone] [phone]) spindle cells with no significant mitotic activity and no necrosis. I have told patient and daughter today that very effective interventions and medications are available for GIST, and that this appears to be slow growing and likely not causing any symptoms. I told them that likely this will be best addressed after treatment for the tonsillar malignancy is completed. Following this discussion, Dr Isidore Moos confirmed with me that this was recommendation from GI tumor board also.   Patient reports that she is still uncomfortable from the dental extractions (11 teeth per Dr Ritta Slot operative note) but has been able to drink fluids including supplements and is using ice packs. The feeding tube is no longer uncomfortable and has functioned without difficulty, with water and supplements also per that tube. She met with nutritionist prior to MD visit today, with instructions for supplements and hydration for CDDP.  She had difficulty sleeping last pm due to discomfort and anxiety related to these recent medical diagnoses. She was more SOB last pm which she  feels was asthma exacerbation due to weather,  no productive cough or chest pain, no fever, breathing ok walking in office today. She continues to smoke. Bowels are moving without diarrhea or constipation. She has had no problems from CT gastric  biopsy. She denies bleeding, LE swelling, other new or different pain, or nausea.  Review of Systems as above, with remainder of 10 point Review of Systems negative / unchanged.       MEDICAL HISTORY:  Past Medical History:  Diagnosis Date  . Alcohol abuse   . Alcoholic hepatitis 50/01/3266  . Anemia   . Anxiety disorder   . Asthma   . Auditory hallucinations   . Cancer (HCC)    cervical  . Chronic back pain   . Chronic bronchitis with acute exacerbation ([place])   . Cocaine abuse   . Constipation   . COPD (chronic obstructive pulmonary disease) ([place])   . Depression   . Dysrhythmia 2012   hx AF in er-cocaine-converted back NSR  . Emphysema   . GERD (gastroesophageal reflux disease)   . Headache   . History of cervical cancer 09/26/2013  . Hypertension   . Malignant neoplasm of tonsillar fossa ([place]) 11/12/2015  . Pneumonia    hx  . Poor circulation   . Schizophrenia ([place])   . Scoliosis   . Tobacco abuse     SURGICAL HISTORY: Past Surgical History:  Procedure Laterality Date  . ABDOMINAL HYSTERECTOMY    . BREAST BIOPSY  2005   lt bx  . COLONOSCOPY  05/07/2002   Dr.Demason- large approximately 1.5cm polyp pedunculated aproximately 10cm from the anal verge bx= adenomatous polyp  . DIRECT LARYNGOSCOPY N/A 10/25/2015   Procedure: DIRECT LARYNGOSCOPY WITH BIOPSY OF ORAL PHARYNGEAL LESION;  Surgeon: Leta Baptist, MD;  Location: [facility];  Service: ENT;  Laterality: N/A;  DIRECT LARYNGOSCOPY WITH BIOPSY OF ORAL PHARYNGEAL LESION  . ESOPHAGOGASTRODUODENOSCOPY  06/02/2002   Dr. Anthony Sar- mild distal esophagitis w/o stricturing or ulceration. stomach and pylorus are normal. inflammatory changes of proximal duldenum in the first portion.   . ESOPHAGOGASTRODUODENOSCOPY (EGD) WITH PROPOFOL N/A 11/23/2015   Procedure: ESOPHAGOGASTRODUODENOSCOPY (EGD) WITH PROPOFOL;  Surgeon: Danie Binder, MD;  [facility];  Service: Endoscopy;  Laterality: N/A;  1245  . FRACTURE  SURGERY  right wrist   [place]  . MULTIPLE EXTRACTIONS WITH ALVEOLOPLASTY N/A 12/13/2015   Procedure: Extraction of tooth #'s 2,3,4,5,6,12,13,14,15,17,and 18 with alveoloplasty, bilateral maxillary tuberosity reductions, and gross debridement of remaining teeth;  Surgeon: Lenn Cal, DDS;  Location: [facility];  Service: Oral Surgery;  Laterality: N/A;  . PEG PLACEMENT  11/18/15  . PORTACATH PLACEMENT Right 11/18/15    SOCIAL HISTORY: Social History   Social History  . Marital status: Widowed    Spouse name: N/A  . Number of children: 3  . Years of education: N/A   Occupational History  . Not on file.   Social History Main Topics  . Smoking status: Current Every Day Smoker    Packs/day: 1.00    Years: 48.00    Types: Cigarettes  . Smokeless tobacco: Former Systems developer    Types: Snuff     Comment: One pack a day  . Alcohol use 0.0 oz/week     Comment: 40 oz a day  . Drug use:     Types: Cocaine, Marijuana     Comment: as of 12/10/15 - last week used both  . Sexual activity: Not Currently  Birth control/ protection: Surgical   Other Topics Concern  . Not on file   Social History Narrative  . No narrative on file   Born here in [place]. Daughter, 2 daughters. Lost her son. 10 grandchildren. No great-grandchildren yet. Used to work pulling tobacco. Is a smoker. Close to 2 ppd. Started smoking at the age of 63. She notes "I don't drink water." 2 40 oz bottles of beer a day. (?)  She says she does regular things for hobbies. Her daughter notes that she likes to go fishing.  FAMILY HISTORY: Family History  Problem Relation Age of Onset  . Alzheimer's disease Mother   . Other Father     "he was beat to death"  . Heart disease Sister   . Gout Sister   . COPD Sister   . Seizures Sister   . Diabetes Sister   . Diabetes Brother   . Cancer Paternal Grandmother     not sure what kind  . Seizures Sister   . Other Sister     "bones are messed up"  .  Hypertension Brother   . Heart attack Brother   . Other Brother     MVA  . Diabetes Brother   . Other Brother     MVA  . Alcohol abuse Daughter   . Other Son     motorcycle accident  . Heart failure Other    Mother & father deceased. Mother was 76 when she died. Had Alzheimer's. Father was younger. 4 brothers & 2 sisters living (and she's one of the sisters). There were 13 siblings total.  ALLERGIES:  is allergic to benicar [olmesartan]; ibuprofen; and latex.  MEDICATIONS:  Current Outpatient Prescriptions  Medication Sig Dispense Refill  . albuterol (PROVENTIL HFA;VENTOLIN HFA) 108 (90 BASE) MCG/ACT inhaler Inhale 1-2 puffs into the lungs every 6 (six) hours as needed for wheezing or shortness of breath. 1 Inhaler 0  . amLODipine (NORVASC) 5 MG tablet Take 5 mg by mouth daily.    Marland Kitchen CISPLATIN IV Inject into the vein.    Marland Kitchen dexamethasone (DECADRON) 4 MG tablet Take 2 tablets by mouth once a day on the day after chemotherapy and then take 2 tablets two times a day for 2 days. Take with food. 30 tablet 1  . HYDROcodone-acetaminophen (NORCO) 5-325 MG tablet Take 1-2 tablets by mouth every 6 (six) hours as needed for moderate pain or severe pain. 32 tablet 0  . lidocaine-prilocaine (EMLA) cream Apply to affected area once 30 g 3  . Multiple Vitamins-Minerals (MULTIVITAMIN) tablet Take 1 tablet by mouth daily. 180 tablet 1  . Nutritional Supplements (FEEDING SUPPLEMENT, OSMOLITE 1.5 CAL,) LIQD 5 cans via PEG, split into 3 meals of 1.5, 1.5 and 2 cans. Flush with 150 cc water before and after each feed (Patient taking differently: by PEG Tube route See admin instructions. 5 cans via PEG, split into 3 meals of 1.5, 1.5 and 2 cans. Flush with 150 cc water before and after each feed)  0  . omeprazole (PRILOSEC) 20 MG capsule 1 PO EVERY MORNING 31 capsule 11  . ondansetron (ZOFRAN) 8 MG tablet Take 1 tablet (8 mg total) by mouth 2 (two) times daily as needed. Start on the third day after  chemotherapy. 30 tablet 1  . PACLitaxel (TAXOL IV) Inject into the vein. weekly    . prochlorperazine (COMPAZINE) 10 MG tablet Take 1 tablet (10 mg total) by mouth every 6 (six) hours as needed (Nausea or  vomiting). 30 tablet 1  . thiamine (VITAMIN B-1) 100 MG tablet Take 1 tablet (100 mg total) by mouth daily. 180 tablet 1  . lidocaine (XYLOCAINE) 2 % solution Mix 1 part 2%viscous lidocaine,1part H2O.Swish and swallow 10m of this mixture,332m before meals and at bedtime, up to QID (Patient not taking: Reported on 12/15/2015) 100 mL 5  . nicotine (NICODERM CQ - DOSED IN MG/24 HOURS) 14 mg/24hr patch Place 1 patch (14 mg total) onto the skin daily. Apply 21 mg patch daily x 6 wk, then 1496match daily x 2 wk, then 7 mg patch daily x 2 wk (Patient not taking: Reported on 12/15/2015) 14 patch 0  . nicotine (NICODERM CQ - DOSED IN MG/24 HOURS) 21 mg/24hr patch Place 1 patch (21 mg total) onto the skin daily. Apply 21 mg patch daily x 6 wk, then 54m36mtch daily x 2 wk, then 7 mg patch daily x 2 wk (Patient not taking: Reported on 12/15/2015) 14 patch 2  . nicotine (NICODERM CQ - DOSED IN MG/24 HR) 7 mg/24hr patch Place 1 patch (7 mg total) onto the skin daily. Apply 21 mg patch daily x 6 wk, then 54mg47mch daily x 2 wk, then 7 mg patch daily x 2 wk (Patient not taking: Reported on 12/15/2015) 14 patch 0  . polyethylene glycol powder (GLYCOLAX/MIRALAX) powder Take one capsule and mix in 4-6 ounces of water, take one to two times daily as needed for constipation. (Patient not taking: Reported on 12/15/2015) 527 g 3   No current facility-administered medications for this visit.    Facility-Administered Medications Ordered in Other Visits  Medication Dose Route Frequency Provider Last Rate Last Dose  . 0.9 %  sodium chloride infusion   Intravenous Continuous ThomaManon Hildinglas, PA-C      . 0.9 %  sodium chloride infusion   Intravenous Continuous ThomaManon Hildinglas, PA-C      . 0.9 %  sodium chloride infusion    Intravenous Continuous ThomaManon Hildinglas, PA-C      . 0.9 %  sodium chloride infusion   Intravenous Continuous ThomaManon Hildinglas, PA-C      . 0.9 %  sodium chloride infusion   Intravenous Continuous ThomaManon Hildinglas, PA-C      . 0.9 %  sodium chloride infusion   Intravenous Continuous ThomaManon Hildinglas, PA-C      . 0.9 %  sodium chloride infusion   Intravenous Continuous ThomaBaird CancerC        Review of Systems  Constitutional: Negative.   Eyes: Negative.   Respiratory: Negative.   Cardiovascular: Negative.   Genitourinary: Negative.   Skin: Negative.   Neurological: Negative.   Endo/Heme/Allergies: Negative.   Psychiatric/Behavioral: Positive for depression and substance abuse.  All other systems reviewed and are negative.  14 point ROS was done and is otherwise as detailed above or in HPI    PHYSICAL EXAMINATION: ECOG PERFORMANCE STATUS: 1 - Symptomatic but completely ambulatory Vitals:   12/15/15 1200  BP: 113/81  Pulse: 88  Resp: 18   Filed Weights   12/15/15 1200  Weight: 109 lb 14.4 oz (49.9 kg)  weight is up almost 1 lb from 11-04-15. Smells of ETOH. Patient is somewhat agitated during visit, daughter very supportive. Some swelling lower face bilaterally, no active bleeding from multiple dental extractions, all clean, posterior pharynx clear, mucous membranes moist.  Lungs with diminshed BS thruout, no use of accessory muscles, no wheezes or rales.  Abdomen soft, protuberant, not tender, few BS. Feeding tube site not tender, clean dressings. LE no edema, cords, tenderness. Diminished muscle mass all extremities.  Easily ambulatory, able to get on and off exam table.  PAC appears in good position, not tender.  Physical Exam  Constitutional: She is oriented to person, place, and time and well-developed, well-nourished, and in no distress.  HENT:  Head: Normocephalic and atraumatic.  Mouth/Throat: Oropharynx is clear and moist.  Glossal palatine arch is gone    Eyes: Conjunctivae and EOM are normal. Pupils are equal, round, and reactive to light. Right eye exhibits no discharge. Left eye exhibits no discharge. No scleral icterus.  Neck: Normal range of motion. Neck supple. No JVD present. No tracheal deviation present. No thyromegaly present.  Cardiovascular: Normal rate, regular rhythm and normal heart sounds.   Pulmonary/Chest: Effort normal and breath sounds normal. No stridor. No respiratory distress. She has no wheezes. She has no rales. She exhibits no tenderness.  Abdominal: Soft. Bowel sounds are normal. She exhibits no distension. There is no tenderness. There is no rebound and no guarding.  Newly placed gastrostomy tube, looks good  Musculoskeletal: Normal range of motion. She exhibits no edema or deformity.  Lymphadenopathy:    She has no cervical adenopathy.  Neurological: She is alert and oriented to person, place, and time. No cranial nerve deficit. Gait normal.  Skin: Skin is warm and dry. No rash noted. No erythema. No pallor.  Nursing note and vitals reviewed.   LABORATORY DATA:  I have reviewed the data as listed Lab Results  Component Value Date   WBC 5.6 12/13/2015   HGB 13.0 12/13/2015   HCT 38.6 12/13/2015   MCV 95.3 12/13/2015   PLT 453 (H) 12/13/2015   CMP     Component Value Date/Time   NA 138 12/13/2015 0653   K 3.6 12/13/2015 0653   CL 106 12/13/2015 0653   CO2 22 12/13/2015 0653   GLUCOSE 79 12/13/2015 0653   BUN 9 12/13/2015 0653   CREATININE 0.67 12/13/2015 0653   CALCIUM 9.1 12/13/2015 0653   PROT 7.2 10/22/2015 1039   ALBUMIN 3.9 10/22/2015 1039   AST 19 10/22/2015 1039   ALT 12 (L) 10/22/2015 1039   ALKPHOS 79 10/22/2015 1039   BILITOT 0.6 10/22/2015 1039   GFRNONAA >60 12/13/2015 0653   GFRAA >60 12/13/2015 6962     RADIOGRAPHIC STUDIES: I have personally reviewed the radiological images as listed and agreed with the findings in the report.  CLINICAL DATA:  Initial treatment strategy for  right tonsillar squamous cell carcinoma diagnosed 10/25/2015.  EXAM: NUCLEAR MEDICINE PET SKULL BASE TO THIGH  TECHNIQUE: 5.7 mCi F-18 FDG was injected intravenously. Full-ring PET imaging was performed from the skull base to thigh after the radiotracer. CT data was obtained and used for attenuation correction and anatomic localization.  FASTING BLOOD GLUCOSE:  Value: 110 mg/dl  COMPARISON:  10/05/2015 neck CT.  FINDINGS: NECK  There is symmetric mild enlargement of the palatine tonsils bilaterally. There is asymmetric hypermetabolism in the right palatine tonsil with max SUV 9.1. There is hypermetabolism in the left palatine tonsil with max SUV 7.6.  Mildly enlarged hypermetabolic 1.0 cm right level 2 lymph node near the angle of the mandible with max SUV 5.0 (series 4/image 21).  Mildly enlarged hypermetabolic 1.0 cm left level 2 lymph node near the angle of the mandible with max SUV 4.4 (series 4/image 23).  No additional hypermetabolic lymph nodes in the neck.  CHEST  There is a 0.7 cm left upper lobe pulmonary nodule (series 6/image 23) with associated mild metabolism with max SUV 1.9 (which is considered significant for a nodule of this small size). At least 3 additional smaller pulmonary nodules in the right upper lobe and left lower lobe are below PET resolution. No acute consolidative airspace disease.  No hypermetabolic axillary, mediastinal or hilar nodes. Left anterior descending, left circumflex and right coronary atherosclerosis. Atherosclerotic nonaneurysmal thoracic aorta. Mild centrilobular and paraseptal emphysema with mild diffuse bronchial wall thickening.  ABDOMEN/PELVIS  There is a lobulated solid homogeneous 4.9 x 2.5 cm hypermetabolic exophytic anterior proximal gastric mass with max SUV 24.8 (series 4/image 99). Stomach is collapsed and otherwise grossly normal.  No abnormal hypermetabolic activity within the liver,  pancreas, adrenal glands, or spleen. No hypermetabolic lymph nodes in the abdomen or pelvis. Atherosclerotic nonaneurysmal abdominal aorta.  SKELETON  No focal hypermetabolic activity to suggest skeletal metastasis.  IMPRESSION: 1. Bilateral palatine tonsil enlargement and hypermetabolism, with asymmetric hypermetabolism in the right palatine tonsil, consistent with primary malignancy in this location. Malignant involvement of the contralateral (left) palatine tonsil cannot be excluded. 2. Bilateral mildly enlarged hypermetabolic level 2 neck lymph nodes, suspicious for nodal metastases. 3. Four subcentimeter pulmonary nodules scattered in both lungs, the largest of which demonstrates mild metabolism (max SUV 1.9), which is considered significant for a nodule of this small size, and raises concern for pulmonary metastases. 4. Solid lobulated hypermetabolic 4.9 x 2.5 cm gastric mass arising exophytically from the anterior proximal stomach, most suggestive of a gastric GIST. Tissue sampling is advised for this mass, probably best obtained via upper endoscopic approach. 5. Three-vessel coronary atherosclerosis.  Aortic atherosclerosis. 6. Mild centrilobular and paraseptal emphysema with mild diffuse bronchial wall thickening, suggesting COPD.   Electronically Signed   By: Ilona Sorrel M.D.   On: 11/10/2015 15:10  PATHOLOGY   ASSESSMENT & PLAN: Malignant neoplasm of tonsillar fossa (HCC)   Staging form: Pharynx - Oropharynx, AJCC 7th Edition   - Clinical: Stage IVA (T3, N2c, M0) - Signed by Eppie Gibson, MD on 11/12/2015 Cervical cancer in 1985, treated surgically with a partial hysterectomy. 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 Lanell Persons visit 11-30-15   Anastyn could not tolerate true induction chemotherapy which is per NCCN  guidelines controversial anyway. There is however a role for sequential cisplatin/XRT. This may be her best option. She is scheduled for dental extraction with alveoloplasty in the OR, with general anesthesia, on 7/19. Dr. Enrique Sack. Once cleared by Dr. Lawana Chambers we will plan on starting single agent cisplatin either weekly or Q 3 week. We will reassess moving forward.    DISCUSSION Interval history reviewed, including new diagnosis of GIST. Patient and daughter understand that the tonsillar malignancy is priority, but that GIST can be addressed separately after treatment for the head and neck malignancy depending on situation then. Patient is understandably upset by second cancer diagnosis, but seems to understand that good interventions are available for GIST when appropriate; daughter seems to follow this discussion well.   I have reviewed need for good hydration with CDDP, both enteral and IV. I have encouraged her to increase supplements as instructed by nutritionist.   I have spoken directly with radiation oncology department at [facility] and directly with Dr Isidore Moos. I have clarified with daughter that the patient will  start treatment with chemotherapy only, which should be more tolerable for her than combination therapy, and that radiation will be added later if appropriate. They have been given printed schedule for medical oncology appointments.  She does not presently have other appointments scheduled with radiation oncology.  Patient and daughter are comfortable with information given with chemotherapy teaching.  Patient is in agreement with plans and gives verbal consent for chemotherapy. They will pick up antiemetics from pharmacy (zofran and compazine) She will return for Cycle #1 Cisplatin / Paclitaxel and follow up with Kirby Crigler, PA-C on 12/22/15.  ASSESSMENT AND PLAN 1.squamous cell carcinoma of right tonsillar fossa IVA (T3N2): in patient with long and ongoing tobacco and ETOH use,  possibly metastatic to lungs with pulmonary nodules to be followed. PS and rest of situation not appropriate to treat with other chemo or concomitant radiation, such that she will begin treatment using single agent CDDP (either weekly or q 3 weeks) and reevaluate ongoing for possible addition of radiation. First chemo planned 12-22-15. 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 Not clearly symptomatic. Plan to address after treatment of head and neck cancer if appropriate. 3. Gastrostomy feeding tube in place by IR, necessary due to upcoming treatment of tonsillar ca 4. Post multiple dental extractions by Dr Enrique Sack on 12-13-15 5.long and ongoing tobacco. Continue to address 6.long and ongoing ETOH. Contnue to address 7.hx drug abuse 8.power PAC in place. 9.history of anxiety and psychiatric disorder 10. COPD and emphysema. Needs to stop smoking.  11. History of cervical cancer 12.poor nutritional status: appreciate ongoing assistance from nutritionist. Plan as above.   All questions were answered. The patient knows to call the clinic with any problems, questions or concerns. Daughter Edward Qualia is best contact, [phone].   This document serves as a record of services personally performed by Evlyn Clines, MD. It was created on her behalf by Arlyce Harman, a trained medical scribe. The creation of this record is based on the scribe's personal observations and the provider's statements to them. This document has been checked and approved by the attending provider.  I have reviewed the above documentation for accuracy and completeness and I agree with the above. Time spent 25 min including >50% counseling and coordination of care. Cc Drs Alroy Bailiff This note was electronically signed.    Carlis Abbott  12/15/2015 1:11 PM  Evlyn Clines, MD

## 2015-12-15 NOTE — Telephone Encounter (Signed)
Pt aware, f/u appt scheduled 10/4 [place] per pt request closer to home. Routed to pcp

## 2015-12-16 ENCOUNTER — Other Ambulatory Visit (HOSPITAL_COMMUNITY): Payer: Self-pay | Admitting: Hematology & Oncology

## 2015-12-21 ENCOUNTER — Ambulatory Visit (HOSPITAL_COMMUNITY): Payer: Self-pay

## 2015-12-22 ENCOUNTER — Encounter (HOSPITAL_COMMUNITY): Payer: Self-pay | Admitting: Oncology

## 2015-12-22 ENCOUNTER — Encounter (HOSPITAL_BASED_OUTPATIENT_CLINIC_OR_DEPARTMENT_OTHER): Payer: Medicaid Other

## 2015-12-22 ENCOUNTER — Encounter (HOSPITAL_BASED_OUTPATIENT_CLINIC_OR_DEPARTMENT_OTHER): Payer: Medicaid Other | Admitting: Oncology

## 2015-12-22 VITALS — BP 110/66 | HR 66 | Temp 97.8°F | Resp 18

## 2015-12-22 DIAGNOSIS — E876 Hypokalemia: Secondary | ICD-10-CM

## 2015-12-22 DIAGNOSIS — C090 Malignant neoplasm of tonsillar fossa: Secondary | ICD-10-CM | POA: Diagnosis not present

## 2015-12-22 DIAGNOSIS — F1410 Cocaine abuse, uncomplicated: Secondary | ICD-10-CM | POA: Diagnosis not present

## 2015-12-22 DIAGNOSIS — Z5111 Encounter for antineoplastic chemotherapy: Secondary | ICD-10-CM | POA: Diagnosis not present

## 2015-12-22 DIAGNOSIS — D473 Essential (hemorrhagic) thrombocythemia: Secondary | ICD-10-CM

## 2015-12-22 DIAGNOSIS — D75839 Thrombocytosis, unspecified: Secondary | ICD-10-CM

## 2015-12-22 LAB — COMPREHENSIVE METABOLIC PANEL
ALT: 12 U/L — ABNORMAL LOW (ref 14–54)
ANION GAP: 7 (ref 5–15)
AST: 21 U/L (ref 15–41)
Albumin: 3.7 g/dL (ref 3.5–5.0)
Alkaline Phosphatase: 84 U/L (ref 38–126)
BILIRUBIN TOTAL: 0.4 mg/dL (ref 0.3–1.2)
BUN: 9 mg/dL (ref 6–20)
CO2: 24 mmol/L (ref 22–32)
Calcium: 8.8 mg/dL — ABNORMAL LOW (ref 8.9–10.3)
Chloride: 104 mmol/L (ref 101–111)
Creatinine, Ser: 0.63 mg/dL (ref 0.44–1.00)
GFR calc Af Amer: >60 mL/min (ref >60)
Glucose, Bld: 157 mg/dL — ABNORMAL HIGH (ref 65–99)
POTASSIUM: 3.3 mmol/L — AB (ref 3.5–5.1)
Sodium: 135 mmol/L (ref 135–145)
TOTAL PROTEIN: 7.2 g/dL (ref 6.5–8.1)

## 2015-12-22 LAB — CBC WITH DIFFERENTIAL/PLATELET
BASOS ABS: 0 10*3/uL (ref 0.0–0.1)
BASOS PCT: 1 %
EOS PCT: 3 %
Eosinophils Absolute: 0.2 10*3/uL (ref 0.0–0.7)
HEMATOCRIT: 36.7 % (ref 36.0–46.0)
Hemoglobin: 12.5 g/dL (ref 12.0–15.0)
LYMPHS PCT: 39 %
Lymphs Abs: 2.8 10*3/uL (ref 0.7–4.0)
MCH: 32.7 pg (ref 26.0–34.0)
MCHC: 34.1 g/dL (ref 30.0–36.0)
MCV: 96.1 fL (ref 78.0–100.0)
MONO ABS: 0.5 10*3/uL (ref 0.1–1.0)
Monocytes Relative: 7 %
NEUTROS ABS: 3.7 10*3/uL (ref 1.7–7.7)
Neutrophils Relative %: 50 %
Platelets: 509 10*3/uL — ABNORMAL HIGH (ref 150–400)
RBC: 3.82 MIL/uL — AB (ref 3.87–5.11)
RDW: 12.8 % (ref 11.5–15.5)
WBC: 7.2 10*3/uL (ref 4.0–10.5)

## 2015-12-22 LAB — MAGNESIUM: MAGNESIUM: 1.6 mg/dL — AB (ref 1.7–2.4)

## 2015-12-22 MED ORDER — SODIUM CHLORIDE 0.9 % IV SOLN
40.0000 mg/m2 | Freq: Once | INTRAVENOUS | Status: AC
  Administered 2015-12-22: 59 mg via INTRAVENOUS
  Filled 2015-12-22: qty 59

## 2015-12-22 MED ORDER — SODIUM CHLORIDE 0.9 % IV SOLN
Freq: Once | INTRAVENOUS | Status: AC
  Administered 2015-12-22: 11:00:00 via INTRAVENOUS
  Filled 2015-12-22: qty 5

## 2015-12-22 MED ORDER — SODIUM CHLORIDE 0.9% FLUSH: 10.0000 mL | INTRAVENOUS | Status: DC | PRN

## 2015-12-22 MED ORDER — PACLITAXEL CHEMO INJECTION 300 MG/50ML
80.0000 mg/m2 | Freq: Once | INTRAVENOUS | Status: AC
  Administered 2015-12-22: 120 mg via INTRAVENOUS
  Filled 2015-12-22: qty 20

## 2015-12-22 MED ORDER — MAGNESIUM SULFATE 2 GM/50ML IV SOLN
2.0000 g | Freq: Once | INTRAVENOUS | Status: AC
  Administered 2015-12-22: 2 g via INTRAVENOUS
  Filled 2015-12-22: qty 50

## 2015-12-22 MED ORDER — ACETAMINOPHEN 325 MG PO TABS
650.0000 mg | ORAL_TABLET | Freq: Once | ORAL | Status: AC
  Administered 2015-12-22: 650 mg via ORAL
  Filled 2015-12-22: qty 2

## 2015-12-22 MED ORDER — PALONOSETRON HCL INJECTION 0.25 MG/5ML
0.2500 mg | Freq: Once | INTRAVENOUS | Status: AC
  Administered 2015-12-22: 0.25 mg via INTRAVENOUS
  Filled 2015-12-22: qty 5

## 2015-12-22 MED ORDER — POTASSIUM CHLORIDE 2 MEQ/ML IV SOLN
Freq: Once | INTRAVENOUS | Status: AC
  Administered 2015-12-22: 09:00:00 via INTRAVENOUS
  Filled 2015-12-22: qty 10

## 2015-12-22 MED ORDER — SODIUM CHLORIDE 0.9 % IV SOLN
Freq: Once | INTRAVENOUS | Status: AC
  Administered 2015-12-22: 11:00:00 via INTRAVENOUS

## 2015-12-22 MED ORDER — HEPARIN SOD (PORK) LOCK FLUSH 100 UNIT/ML IV SOLN
500.0000 [IU] | Freq: Once | INTRAVENOUS | Status: AC | PRN
  Administered 2015-12-22: 500 [IU]
  Filled 2015-12-22: qty 5

## 2015-12-22 MED ORDER — DIPHENHYDRAMINE HCL 50 MG/ML IJ SOLN
50.0000 mg | Freq: Once | INTRAMUSCULAR | Status: AC
  Administered 2015-12-22: 50 mg via INTRAVENOUS
  Filled 2015-12-22: qty 1

## 2015-12-22 MED ORDER — POTASSIUM CHLORIDE CRYS ER 20 MEQ PO TBCR
20.0000 meq | EXTENDED_RELEASE_TABLET | Freq: Two times a day (BID) | ORAL | Status: DC
  Administered 2015-12-22: 20 meq via ORAL
  Filled 2015-12-22 (×3): qty 1

## 2015-12-22 MED ORDER — FAMOTIDINE IN NACL 20-0.9 MG/50ML-% IV SOLN
20.0000 mg | Freq: Once | INTRAVENOUS | Status: AC
  Administered 2015-12-22: 20 mg via INTRAVENOUS
  Filled 2015-12-22: qty 50

## 2015-12-22 NOTE — Assessment & Plan Note (Addendum)
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  Starting systemic chemotherapy with weekly Cisplatin/Paclitaxel on 12/22/2015.  Oncology history is updated.  Pre-treatment labs today: CBC diff, CMET, Magnesium.  I personally reviewed and went over laboratory results with the patient.  The results are noted within this dictation.  Magnesium is minimally low and therefore 2 g of IV mag sulfate is ordered today.  If this continues to be an ongoing issue, PO magnesium at home will be prescribed.  Additionally hypokalemia is noted.  20 mEq of K+ is added to her IVF today and I will also add 20 mEq PO Kdur today in the clinic.  If this continues to be an issue moving forward, Kdur at home can be prescribed.  Thrombocytosis is noted with normal HGB.  Orders added for labs next week include iron studies and JAK2 with reflex.  Labs next week: CBC diff, CMET, Magnesium, iron/TIBC, ferritin, JAK2 with reflex.  Return as scheduled for IVF and follow-up with next treatment planned in 1 week.

## 2015-12-22 NOTE — Progress Notes (Signed)
[place], MD [address] [place] [address]  Hypomagnesemia - Plan: DISCONTINUED: magnesium sulfate IVPB 2 g 50 mL  Malignant neoplasm of tonsillar fossa (HCC)  Hypokalemia - Plan: DISCONTINUED: potassium chloride SA (K-DUR,KLOR-CON) CR tablet 20 mEq  Thrombocytosis (HCC) - Plan: Iron and TIBC, Ferritin, JAK2 V617F, Rfx CALR/E12/MPL  CURRENT THERAPY: Weekly Cisplatin/Paclitaxel beginning on 12/22/2015  INTERVAL HISTORY: Molly Campbell 55 y.o. 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  Starting systemic chemotherapy with weekly Cisplatin/Paclitaxel on 12/22/2015.    Malignant neoplasm of tonsillar fossa ([place])   10/05/2015 Imaging    CT imaging [facility], bilateral palatine tonsil enlargement with asymmetric abnormality of R tonsil and soft palate, possible R cervical adenopathy     10/25/2015 Procedure    Direct laryngoscopy and biopsyof right tonsillar mass.     10/27/2015 Pathology Results    invasive squamous cell carcinoma, p16 NEGATIVE     11/10/2015 PET scan    Bilateral palatine tonsil enlargement and hypermetabolism, with asymmetric hypermetabolism in the right palatine tonsil, consistent with primary malignancy in this location. Bilateral mildly enlarged hypermetabolic level 2 neck lymph nodes.     11/18/2015 Procedure    IR Gastrostomy Tube, fluoroscopic 20 French Pull through Gastrostomy     11/27/2015 Procedure    CT-guided core biopsy performed of the bilobed shaped soft tissue mass anterior to the proximal stomach.     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     12/07/2015 Imaging    NM Myocar multi w spect- Nuclear stress EF: 70%.     12/22/2015 -  Chemotherapy    Cisplatin/Paclitaxel weekly     She is here for her first day of treatment.  She denies any complaints today.  Review  of Systems  Constitutional: Negative.   HENT: Negative.   Eyes: Negative.   Respiratory: Negative.   Cardiovascular: Negative.   Gastrointestinal: Negative.   Genitourinary: Negative.   Musculoskeletal: Negative.   Skin: Negative.   Neurological: Negative.   Endo/Heme/Allergies: Negative.   Psychiatric/Behavioral: Negative.   All other systems reviewed and are negative.   Past Medical History:  Diagnosis Date  . Alcohol abuse   . Alcoholic hepatitis [id number]  . Anemia   . Anxiety disorder   . Asthma   . Auditory hallucinations   . Cancer (HCC)    cervical  . Chronic back pain   . Chronic bronchitis with acute exacerbation ([place])   . Cocaine abuse   . Constipation   . COPD (chronic obstructive pulmonary disease) ([facility])   . Depression   . Dysrhythmia 2012   hx AF in er-cocaine-converted back NSR  . Emphysema   . GERD (gastroesophageal reflux disease)   . Headache   . History of cervical cancer 09/26/2013  . Hypertension   . Malignant neoplasm of tonsillar fossa ([facility]) 11/12/2015  . Pneumonia    hx  . Poor circulation   . Schizophrenia ([place])   . Scoliosis   . Tobacco abuse     Past Surgical History:  Procedure Laterality Date  . ABDOMINAL HYSTERECTOMY    . BREAST BIOPSY  2005   lt bx  . COLONOSCOPY  05/07/2002   Dr.Demason- large approximately 1.5cm polyp pedunculated aproximately 10cm from the anal verge bx= adenomatous polyp  . DIRECT LARYNGOSCOPY N/A 10/25/2015   Procedure: DIRECT LARYNGOSCOPY  WITH BIOPSY OF ORAL PHARYNGEAL LESION;  Surgeon: Leta Baptist, MD;  Location: [facility];  Service: ENT;  Laterality: N/A;  DIRECT LARYNGOSCOPY WITH BIOPSY OF ORAL PHARYNGEAL LESION  . ESOPHAGOGASTRODUODENOSCOPY  06/02/2002   Dr. Anthony Sar- mild distal esophagitis w/o stricturing or ulceration. stomach and pylorus are normal. inflammatory changes of proximal duldenum in the first portion.   . ESOPHAGOGASTRODUODENOSCOPY (EGD) WITH PROPOFOL N/A 11/23/2015   Procedure:  ESOPHAGOGASTRODUODENOSCOPY (EGD) WITH PROPOFOL;  Surgeon: Danie Binder, MD;  [facility];  Service: Endoscopy;  Laterality: N/A;  1245  . FRACTURE SURGERY  right wrist   [place]  . MULTIPLE EXTRACTIONS WITH ALVEOLOPLASTY N/A 12/13/2015   Procedure: Extraction of tooth #'s 2,3,4,5,6,12,13,14,15,17,and 18 with alveoloplasty, bilateral maxillary tuberosity reductions, and gross debridement of remaining teeth;  Surgeon: Lenn Cal, DDS;  Location: [facility];  Service: Oral Surgery;  Laterality: N/A;  . PEG PLACEMENT  11/18/15  . PORTACATH PLACEMENT Right 11/18/15    Family History  Problem Relation Age of Onset  . Alzheimer's disease Mother   . Other Father     "he was beat to death"  . Heart disease Sister   . Gout Sister   . COPD Sister   . Seizures Sister   . Diabetes Sister   . Diabetes Brother   . Cancer Paternal Grandmother     not sure what kind  . Seizures Sister   . Other Sister     "bones are messed up"  . Hypertension Brother   . Heart attack Brother   . Other Brother     MVA  . Diabetes Brother   . Other Brother     MVA  . Alcohol abuse Daughter   . Other Son     motorcycle accident  . Heart failure Other     Social History   Social History  . Marital status: Widowed    Spouse name: N/A  . Number of children: 3  . Years of education: N/A   Social History Main Topics  . Smoking status: Current Every Day Smoker    Packs/day: 1.00    Years: 48.00    Types: Cigarettes  . Smokeless tobacco: Former Systems developer    Types: Snuff     Comment: One pack a day  . Alcohol use 0.0 oz/week     Comment: 40 oz a day  . Drug use:     Types: Cocaine, Marijuana     Comment: as of 12/10/15 - last week used both  . Sexual activity: Not Currently    Birth control/ protection: Surgical   Other Topics Concern  . None   Social History Narrative  . None     PHYSICAL EXAMINATION  ECOG PERFORMANCE STATUS: 1 - Symptomatic but completely ambulatory  There were  no vitals filed for this visit.  BP 135/85 P 100 R 18 T 98.5 O2 sat 100% RA  GENERAL:alert, no distress, well nourished, well developed, comfortable, cooperative, smiling and in chemo-bed, unaccompanied SKIN: skin color, texture, turgor are normal, no rashes or significant lesions HEAD: Normocephalic, No masses, lesions, tenderness or abnormalities EYES: normal, EOMI, Conjunctiva are pink and non-injected EARS: External ears normal OROPHARYNX:lips, buccal mucosa, and tongue normal and mucous membranes are moist  NECK: supple, trachea midline LYMPH:  no palpable lymphadenopathy BREAST:not examined LUNGS: clear to auscultation  HEART: regular rate & rhythm ABDOMEN:abdomen soft and normal bowel sounds BACK: Back symmetric, no curvature. EXTREMITIES:less then 2 second capillary refill, no  joint deformities, effusion, or inflammation, no skin discoloration, no cyanosis  NEURO: alert & oriented x 3 with fluent speech, no focal motor/sensory deficits, gait normal  LABORATORY DATA: CBC    Component Value Date/Time   WBC 7.2 12/22/2015 0911   RBC 3.82 (L) 12/22/2015 0911   HGB 12.5 12/22/2015 0911   HCT 36.7 12/22/2015 0911   PLT 509 (H) 12/22/2015 0911   MCV 96.1 12/22/2015 0911   MCH 32.7 12/22/2015 0911   MCHC 34.1 12/22/2015 0911   RDW 12.8 12/22/2015 0911   LYMPHSABS 2.8 12/22/2015 0911   MONOABS 0.5 12/22/2015 0911   EOSABS 0.2 12/22/2015 0911   BASOSABS 0.0 12/22/2015 0911      Chemistry      Component Value Date/Time   NA 135 12/22/2015 0911   K 3.3 (L) 12/22/2015 0911   CL 104 12/22/2015 0911   CO2 24 12/22/2015 0911   BUN 9 12/22/2015 0911   CREATININE 0.63 12/22/2015 0911   GLU 99 09/17/2015      Component Value Date/Time   CALCIUM 8.8 (L) 12/22/2015 0911   ALKPHOS 84 12/22/2015 0911   AST 21 12/22/2015 0911   ALT 12 (L) 12/22/2015 0911   BILITOT 0.4 12/22/2015 0911        PENDING LABS:   RADIOGRAPHIC STUDIES:  Nm Myocar Multi W/spect W/wall  Motion / Ef  Result Date: 12/07/2015  No clearly diagnostic ST segment changes to indicate ischemia, however progressive T-wave inversion noted anterolaterally with Lexiscan in the absence of chest pain.  Small, mild intensity, partially reversible basal anteroseptal defect that is suggestive of variable soft tissue attenuation, less likely ischemic zone but not completely excluded.  This is a low risk study.  Nuclear stress EF: 70%.    Ct Biopsy  Result Date: 11/27/2015 CLINICAL DATA:  History of tonsillar carcinoma. Hypermetabolic mass anterior to the proximal stomach requiring biopsy. EXAM: CT GUIDED NEEDLE ASPIRATE BIOPSY OF BODY PART ANESTHESIA/SEDATION: 2.0  Mg IV Versed; 100 mcg IV Fentanyl Total Moderate Sedation Time: 20 minutes. The patient's level of consciousness and physiologic status were continuously monitored during the procedure by Radiology nursing. PROCEDURE: The procedure risks, benefits, and alternatives were explained to the patient. Questions regarding the procedure were encouraged and answered. The patient understands and consents to the procedure. A time-out was performed prior to the procedure. The anterior upper left abdominal wall was prepped with chlorhexidine in a sterile fashion, and a sterile drape was applied covering the operative field. A sterile gown and sterile gloves were used for the procedure. Local anesthesia was provided with 1% Lidocaine. CT was performed through the upper abdomen in a supine position. After localizing the anterior gastric mass, a 17 gauge trocar needle was advanced under CT guidance. After confirming needle position, a total of 3 separate 18 gauge core biopsy samples were obtained. These were submitted in formalin. Post biopsy CT was performed. COMPLICATIONS: None FINDINGS: Bilobed shaped mass abutting the proximal stomach anteriorly and centered anterior to the gastric lumen measures approximately 2.6 x 4.6 cm in greatest transverse dimensions.  Solid tissue was obtained. IMPRESSION: CT-guided core biopsy performed of the bilobed shaped soft tissue mass anterior to the proximal stomach. Electronically Signed   By: Aletta Edouard M.D.   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  Starting systemic chemotherapy with weekly Cisplatin/Paclitaxel on 12/22/2015.  Oncology history is updated.  Pre-treatment labs today: CBC diff, CMET, Magnesium.  I personally reviewed and went over laboratory results with the patient.  The results are noted within this dictation.  Magnesium is minimally low and therefore 2 g of IV mag sulfate is ordered today.  If this continues to be an ongoing issue, PO magnesium at home will be prescribed.  Additionally hypokalemia is noted.  20 mEq of K+ is added to her IVF today and I will also add 20 mEq PO Kdur today in the clinic.  If this continues to be an issue moving forward, Kdur at home can be prescribed.  Thrombocytosis is noted with normal HGB.  Orders added for labs next week include iron studies and JAK2 with reflex.  Labs next week: CBC diff, CMET, Magnesium, iron/TIBC, ferritin, JAK2 with reflex.  Return as scheduled for IVF and follow-up with next treatment planned in 1 week.   ORDERS PLACED FOR THIS ENCOUNTER: Orders Placed This Encounter  Procedures  . Iron and TIBC  . Ferritin  . JAK2 V617F, Rfx CALR/E12/MPL    MEDICATIONS PRESCRIBED THIS ENCOUNTER: No orders of the defined types were placed in this encounter.   THERAPY PLAN:  Continue treatment with supportive care as outlined above.  All questions were answered. The patient knows to call the clinic with any problems, questions or concerns. We can certainly see the patient much sooner if necessary.  Patient and plan discussed with Dr. Ancil Linsey and she is in agreement with the aforementioned.    This note is electronically signed by: Doy Mince 12/22/2015 4:41 PM

## 2015-12-22 NOTE — Patient Instructions (Signed)
[place] at [facility] Discharge Instructions  RECOMMENDATIONS MADE BY THE CONSULTANT AND ANY TEST RESULTS WILL BE SENT TO YOUR REFERRING PHYSICIAN.  Exam done and seen today by Kirby Crigler Treatment today. Return to see the Doctor as scheduled. Call the clinic for any concerns or questions.  Thank you for choosing [place] at [facility] to provide your oncology and hematology care.  To afford each patient quality time with our provider, please arrive at least 15 minutes before your scheduled appointment time.   Beginning January 23rd 2017 lab work for the [facility] will be done in the  Main lab at [facility] on 1st floor. If you have a lab appointment with the [facility] please come in thru the  Main Entrance and check in at the main information desk  You need to re-schedule your appointment should you arrive 10 or more minutes late.  We strive to give you quality time with our providers, and arriving late affects you and other patients whose appointments are after yours.  Also, if you no show three or more times for appointments you may be dismissed from the clinic at the providers discretion.     Again, thank you for choosing [facility].  Our hope is that these requests will decrease the amount of time that you wait before being seen by our physicians.       _____________________________________________________________  Should you have questions after your visit to [facility], please contact our office at [phone] between the hours of 8:30 a.m. and 4:30 p.m.  Voicemails left after 4:30 p.m. will not be returned until the following business day.  For prescription refill requests, have your pharmacy contact our office.         Resources For Cancer Patients and their Caregivers ? American Cancer Society: Can assist with transportation, wigs, general needs, runs Look Good Feel Better.         757-361-8858 ? Cancer Care: Provides financial assistance, online support groups, medication/co-pay assistance.  1-800-813-HOPE (587)474-9804) ? [facility] Assists [place] cancer patients and their families through emotional , educational and financial support.  (858) 590-8212 ? [place] DSS Where to apply for food stamps, Medicaid and utility assistance. (602)841-0503 ? [facility]: Transportation to medical appointments. 908-641-4284 ? Social Security Administration: May apply for disability if have a Stage IV cancer. 215-135-6684 920-403-4702 ? [employer], Disability and Transit Services: Assists with nutrition, care and transit needs. [place] Support Programs: @10RELATIVEDAYS @ > Cancer Support Group  2nd Tuesday of the month 1pm-2pm, Journey Room  > Creative Journey  3rd Tuesday of the month 1130am-1pm, Journey Room  > Look Good Feel Better  1st Wednesday of the month 10am-12 noon, Journey Room (Call [place] to register [phone])

## 2015-12-22 NOTE — Progress Notes (Signed)
Tolerated tx w/o adverse reaction.  Alert, in no distress.  VSS.  Discharged ambulatory in c/o family.  

## 2015-12-23 ENCOUNTER — Telehealth (HOSPITAL_COMMUNITY): Payer: Self-pay | Admitting: *Deleted

## 2015-12-23 NOTE — Telephone Encounter (Signed)
Spoke with pt's daughter re:  24 hour f/u post chemo yesterday.  She reports that her mother was "doing fine" earlier today and that she planned to check on her again this evening.  Instructed daughter to call the clinic if pt was not feeling well or if there were any concerns - verbalizes understanding.

## 2015-12-27 ENCOUNTER — Encounter (HOSPITAL_COMMUNITY): Payer: Self-pay

## 2015-12-27 ENCOUNTER — Other Ambulatory Visit (HOSPITAL_COMMUNITY): Payer: Self-pay | Admitting: Dentistry

## 2015-12-27 ENCOUNTER — Encounter (HOSPITAL_COMMUNITY)
Admission: RE | Admit: 2015-12-27 | Discharge: 2015-12-27 | Disposition: A | Discharge status: Home / Self Care | Payer: Medicaid Other | Source: Ambulatory Visit | Attending: Hematology & Oncology | Admitting: Hematology & Oncology

## 2015-12-27 ENCOUNTER — Ambulatory Visit (HOSPITAL_COMMUNITY): Payer: Medicaid - Dental | Admitting: Dentistry

## 2015-12-27 VITALS — BP 116/66 | HR 66 | Temp 98.1°F

## 2015-12-27 DIAGNOSIS — C099 Malignant neoplasm of tonsil, unspecified: Secondary | ICD-10-CM

## 2015-12-27 DIAGNOSIS — K08409 Partial loss of teeth, unspecified cause, unspecified class: Secondary | ICD-10-CM

## 2015-12-27 DIAGNOSIS — C090 Malignant neoplasm of tonsillar fossa: Secondary | ICD-10-CM | POA: Insufficient documentation

## 2015-12-27 MED ORDER — SODIUM FLUORIDE 1.1 % DT GEL: DENTAL | 99 refills | Status: DC

## 2015-12-27 MED ORDER — HEPARIN SOD (PORK) LOCK FLUSH 100 UNIT/ML IV SOLN
INTRAVENOUS | Status: AC
  Filled 2015-12-27: qty 5

## 2015-12-27 MED ORDER — HEPARIN SOD (PORK) LOCK FLUSH 100 UNIT/ML IV SOLN
500.0000 [IU] | INTRAVENOUS | Status: AC | PRN
  Administered 2015-12-27: 500 [IU]

## 2015-12-27 MED ORDER — SODIUM CHLORIDE 0.9 % IV SOLN
INTRAVENOUS | Status: DC
  Administered 2015-12-27: 08:00:00 via INTRAVENOUS

## 2015-12-27 MED ORDER — SODIUM FLUORIDE 1.1 % DT GEL: DENTAL | 11 refills | Status: DC

## 2015-12-27 MED FILL — FLUORISHIELD 1.1% GEL: 1.1 % | 30 days supply | Qty: 114 | Fill #0

## 2015-12-27 NOTE — Progress Notes (Signed)
POST OPERATIVE NOTE:  12/27/2015 Alease Frame [id number]  VITALS: BP 116/66 (BP Location: Left Arm)   Pulse 66   Temp 98.1 F (36.7 C) (Oral)   LABS:  Lab Results  Component Value Date   WBC 7.2 12/22/2015   HGB 12.5 12/22/2015   HCT 36.7 12/22/2015   MCV 96.1 12/22/2015   PLT 509 (H) 12/22/2015   BMET    Component Value Date/Time   NA 135 12/22/2015 0911   K 3.3 (L) 12/22/2015 0911   CL 104 12/22/2015 0911   CO2 24 12/22/2015 0911   GLUCOSE 157 (H) 12/22/2015 0911   BUN 9 12/22/2015 0911   CREATININE 0.63 12/22/2015 0911   CALCIUM 8.8 (L) 12/22/2015 0911   GFRNONAA >60 12/22/2015 0911   GFRAA >60 12/22/2015 0911    Lab Results  Component Value Date   INR 0.98 11/18/2015   INR 0.96 03/14/2011   INR 0.94 09/08/2010   No results found for: PTT   Alease Frame is status post multiple extractions with alveoloplasty and gross debridement of remaining dentition in the operating room on 12/13/15.  Patient now presents for evaluation of healing and suture removal as needed.  SUBJECTIVE: Patient with minimal discomfort from dental extractions. A few stitches remain.  EXAM: There is no sign of infection, heme, or ooze. Sutures are loosely intact. Patient is healing in by generalized primary closure.  PROCEDURE: The patient was given a chlorhexidine gluconate rinse for 30 seconds. Sutures were then removed without complication. Patient tolerated the procedure well.  ASSESSMENT: Post operative course is consistent with dental procedures performed in the OR on 12/13/15. Patient has an edentulous maxilla. There is atrophy of the edentulous alveolar ridges. Plaque on the lower teeth as noted in oral hygiene improvement was highly suggested.  PLAN: 1. Use salt water rinses as needed to aid healing. 2. Brush after meals and at bedtime. Brush tongue daily. 3. Apply fluoride in fluoride trays to lower teeth at bedtime as instructed. 4. Patient to call for follow-up  appointment as needed or when the initial chemotherapy regimen is completed.   Lenn Cal, DDS

## 2015-12-27 NOTE — Patient Instructions (Addendum)
  PLAN: 1. Use salt water rinses as needed to aid healing. 2. Brush after meals and at bedtime. Brush tongue daily. 3. Apply fluoride in fluoride trays to lower teeth at bedtime as instructed. 4. Patient to call for follow-up appointment as needed or when the initial chemotherapy regimen is completed.   Lenn Cal, DDS  FLUORIDE TRAYS PATIENT INSTRUCTIONS    Obtain prescription from the pharmacy.  Don't be surprised if it needs to be ordered.  Be sure to let the pharmacy know when you are close to needing a new refill for them to have it ready for you without interruption of Fluoride use.  The best time to use your Fluoride is before bed time.  You must brush your teeth very well and floss before using the Fluoride in order to get the best use out of the Fluoride treatments.  Place 1 drop of Fluoride gel per tooth in the tray.  Place the tray on your lower teeth and your upper teeth.  Make sure the trays are seated all the way.  Remember, they only fit one way on your teeth.  Insert for 5 full minutes.  At the end of the 5 minutes, take the trays out.  SPIT OUT excess. .  Do NOT rinse your mouth!  Do NOT eat or drink after treatments for at least 30 minutes.  This is why the best time for your treatments is before bedtime.  Clean the inside of your Fluoride trays using COLD WATER and a toothbrush.  In order to keep your Trays from discoloring and free from odors, soak them overnight in denture cleaners such as Efferdent.  Do not use bleach or non denture products.  Store the trays in a safe dry place AWAY from any heat until your next treatment.  If anything happens to your Fluoride trays, or they don't fit as well after any dental work, please let us know as soon as possible.

## 2015-12-28 ENCOUNTER — Ambulatory Visit (HOSPITAL_COMMUNITY): Payer: Self-pay

## 2015-12-28 ENCOUNTER — Encounter: Payer: Self-pay | Admitting: *Deleted

## 2015-12-28 ENCOUNTER — Ambulatory Visit (HOSPITAL_COMMUNITY): Payer: Self-pay | Admitting: Oncology

## 2015-12-28 NOTE — Progress Notes (Signed)
[facility] Psychosocial Distress Screening Clinical Social Work  Clinical Social Work was referred by distress screening protocol.  The patient scored a 7 on the Psychosocial Distress Thermometer which indicates severe distress. Clinical Social Worker phoned pt to assess for distress and other psychosocial needs. CSW also responded to a distress screen on November 17, 2015. CSW left message for pt to return CSW call in order to address concerns mentioned in this distress screen.   ONCBCN DISTRESS SCREENING 12/22/2015  Screening Type Initial Screening  Distress experienced in past week (1-10) 7  Practical problem type Insurance;Transportation  Family Problem type Children;Other (comment)  Emotional problem type Depression;Nervousness/Anxiety;Adjusting to illness;Adjusting to appearance changes  Information Concerns Type Lack of info about diagnosis  Physical Problem type Pain;Sleep/insomnia;Talking  Physician notified of physical symptoms   Referral to clinical social work Yes  Referral to dietition Yes  Referral to financial advocate Yes  Referral to support programs Yes    Clinical Social Worker follow up needed: Yes.    If yes, follow up plan: CSW awaits return call. CSW provided pt and daughter all of CSW's contacts to return call.  Loren Racer, [place] Tuesdays   [phone]

## 2015-12-29 ENCOUNTER — Encounter (HOSPITAL_BASED_OUTPATIENT_CLINIC_OR_DEPARTMENT_OTHER): Payer: Medicaid Other

## 2015-12-29 ENCOUNTER — Other Ambulatory Visit (HOSPITAL_COMMUNITY): Payer: Self-pay | Admitting: Oncology

## 2015-12-29 ENCOUNTER — Encounter (HOSPITAL_BASED_OUTPATIENT_CLINIC_OR_DEPARTMENT_OTHER): Payer: Medicaid Other | Admitting: Hematology & Oncology

## 2015-12-29 ENCOUNTER — Encounter (HOSPITAL_COMMUNITY): Payer: Self-pay | Admitting: Hematology & Oncology

## 2015-12-29 ENCOUNTER — Encounter: Payer: Self-pay | Admitting: Dietician

## 2015-12-29 VITALS — BP 150/80 | HR 89 | Temp 97.7°F | Resp 18

## 2015-12-29 VITALS — BP 116/74 | HR 95 | Temp 98.8°F | Resp 16 | Wt 108.3 lb

## 2015-12-29 DIAGNOSIS — C090 Malignant neoplasm of tonsillar fossa: Secondary | ICD-10-CM

## 2015-12-29 DIAGNOSIS — C49A2 Gastrointestinal stromal tumor of stomach: Secondary | ICD-10-CM

## 2015-12-29 DIAGNOSIS — K219 Gastro-esophageal reflux disease without esophagitis: Secondary | ICD-10-CM | POA: Diagnosis not present

## 2015-12-29 DIAGNOSIS — Z720 Tobacco use: Secondary | ICD-10-CM | POA: Diagnosis not present

## 2015-12-29 DIAGNOSIS — F1410 Cocaine abuse, uncomplicated: Secondary | ICD-10-CM | POA: Diagnosis not present

## 2015-12-29 DIAGNOSIS — Z5111 Encounter for antineoplastic chemotherapy: Secondary | ICD-10-CM | POA: Diagnosis not present

## 2015-12-29 DIAGNOSIS — F1010 Alcohol abuse, uncomplicated: Secondary | ICD-10-CM

## 2015-12-29 DIAGNOSIS — K7010 Alcoholic hepatitis without ascites: Secondary | ICD-10-CM | POA: Diagnosis not present

## 2015-12-29 DIAGNOSIS — Z7289 Other problems related to lifestyle: Secondary | ICD-10-CM

## 2015-12-29 DIAGNOSIS — F79 Unspecified intellectual disabilities: Secondary | ICD-10-CM | POA: Diagnosis not present

## 2015-12-29 DIAGNOSIS — D75839 Thrombocytosis, unspecified: Secondary | ICD-10-CM

## 2015-12-29 DIAGNOSIS — D473 Essential (hemorrhagic) thrombocythemia: Secondary | ICD-10-CM

## 2015-12-29 DIAGNOSIS — Z789 Other specified health status: Secondary | ICD-10-CM

## 2015-12-29 LAB — IRON AND TIBC
IRON: 89 ug/dL (ref 28–170)
Saturation Ratios: 26 % (ref 10.4–31.8)
TIBC: 347 ug/dL (ref 250–450)
UIBC: 258 ug/dL

## 2015-12-29 LAB — CBC WITH DIFFERENTIAL/PLATELET
Basophils Absolute: 0 10*3/uL (ref 0.0–0.1)
Basophils Relative: 0 %
EOS ABS: 0.1 10*3/uL (ref 0.0–0.7)
Eosinophils Relative: 2 %
HEMATOCRIT: 35.2 % — AB (ref 36.0–46.0)
HEMOGLOBIN: 12 g/dL (ref 12.0–15.0)
LYMPHS ABS: 3.2 10*3/uL (ref 0.7–4.0)
LYMPHS PCT: 49 %
MCH: 32.2 pg (ref 26.0–34.0)
MCHC: 34.1 g/dL (ref 30.0–36.0)
MCV: 94.4 fL (ref 78.0–100.0)
MONOS PCT: 6 %
Monocytes Absolute: 0.4 10*3/uL (ref 0.1–1.0)
NEUTROS PCT: 43 %
Neutro Abs: 2.8 10*3/uL (ref 1.7–7.7)
Platelets: 614 10*3/uL — ABNORMAL HIGH (ref 150–400)
RBC: 3.73 MIL/uL — ABNORMAL LOW (ref 3.87–5.11)
RDW: 12.6 % (ref 11.5–15.5)
WBC: 6.6 10*3/uL (ref 4.0–10.5)

## 2015-12-29 LAB — COMPREHENSIVE METABOLIC PANEL
ALK PHOS: 71 U/L (ref 38–126)
ALT: 15 U/L (ref 14–54)
ANION GAP: 6 (ref 5–15)
AST: 18 U/L (ref 15–41)
Albumin: 3.6 g/dL (ref 3.5–5.0)
BILIRUBIN TOTAL: 0.4 mg/dL (ref 0.3–1.2)
BUN: 15 mg/dL (ref 6–20)
CALCIUM: 8.5 mg/dL — AB (ref 8.9–10.3)
CO2: 26 mmol/L (ref 22–32)
CREATININE: 0.65 mg/dL (ref 0.44–1.00)
Chloride: 102 mmol/L (ref 101–111)
Glucose, Bld: 109 mg/dL — ABNORMAL HIGH (ref 65–99)
Potassium: 4 mmol/L (ref 3.5–5.1)
Sodium: 134 mmol/L — ABNORMAL LOW (ref 135–145)
TOTAL PROTEIN: 6.6 g/dL (ref 6.5–8.1)

## 2015-12-29 LAB — FERRITIN: Ferritin: 104 ng/mL (ref 11–307)

## 2015-12-29 LAB — MAGNESIUM: Magnesium: 1.9 mg/dL (ref 1.7–2.4)

## 2015-12-29 MED ORDER — PACLITAXEL CHEMO INJECTION 300 MG/50ML
80.0000 mg/m2 | Freq: Once | INTRAVENOUS | Status: AC
  Administered 2015-12-29: 120 mg via INTRAVENOUS
  Filled 2015-12-29: qty 20

## 2015-12-29 MED ORDER — FAMOTIDINE IN NACL 20-0.9 MG/50ML-% IV SOLN
20.0000 mg | Freq: Once | INTRAVENOUS | Status: AC
  Administered 2015-12-29: 20 mg via INTRAVENOUS
  Filled 2015-12-29: qty 50

## 2015-12-29 MED ORDER — FUROSEMIDE 10 MG/ML IJ SOLN
20.0000 mg | Freq: Once | INTRAMUSCULAR | Status: AC
  Administered 2015-12-29: 20 mg via INTRAVENOUS
  Filled 2015-12-29: qty 2

## 2015-12-29 MED ORDER — FOSAPREPITANT DIMEGLUMINE INJECTION 150 MG
Freq: Once | INTRAVENOUS | Status: AC
  Administered 2015-12-29: 11:00:00 via INTRAVENOUS
  Filled 2015-12-29: qty 5

## 2015-12-29 MED ORDER — POTASSIUM CHLORIDE 2 MEQ/ML IV SOLN
Freq: Once | INTRAVENOUS | Status: AC
  Administered 2015-12-29: 09:00:00 via INTRAVENOUS
  Filled 2015-12-29: qty 10

## 2015-12-29 MED ORDER — HYDROCODONE-ACETAMINOPHEN 5-325 MG PO TABS: 1.0000 | ORAL_TABLET | Freq: Four times a day (QID) | ORAL | 0 refills | Status: DC | PRN

## 2015-12-29 MED ORDER — SODIUM CHLORIDE 0.9 % IV SOLN
40.0000 mg/m2 | Freq: Once | INTRAVENOUS | Status: AC
  Administered 2015-12-29: 59 mg via INTRAVENOUS
  Filled 2015-12-29: qty 59

## 2015-12-29 MED ORDER — PALONOSETRON HCL INJECTION 0.25 MG/5ML
0.2500 mg | Freq: Once | INTRAVENOUS | Status: AC
  Administered 2015-12-29: 0.25 mg via INTRAVENOUS
  Filled 2015-12-29: qty 5

## 2015-12-29 MED ORDER — DIPHENHYDRAMINE HCL 50 MG/ML IJ SOLN
50.0000 mg | Freq: Once | INTRAMUSCULAR | Status: AC
  Administered 2015-12-29: 50 mg via INTRAVENOUS
  Filled 2015-12-29: qty 1

## 2015-12-29 MED ORDER — SODIUM CHLORIDE 0.9 % IV SOLN
Freq: Once | INTRAVENOUS | Status: AC
Start: 2015-12-29 — End: 2015-12-29
  Administered 2015-12-29: 11:00:00 via INTRAVENOUS

## 2015-12-29 MED ORDER — HEPARIN SOD (PORK) LOCK FLUSH 100 UNIT/ML IV SOLN
500.0000 [IU] | Freq: Once | INTRAVENOUS | Status: AC | PRN
  Administered 2015-12-29: 500 [IU]
  Filled 2015-12-29: qty 5

## 2015-12-29 MED ORDER — SODIUM CHLORIDE 0.9% FLUSH
10.0000 mL | INTRAVENOUS | Status: DC | PRN
  Administered 2015-12-29: 10 mL
  Filled 2015-12-29: qty 10

## 2015-12-29 MED ORDER — ACETAMINOPHEN 325 MG PO TABS
650.0000 mg | ORAL_TABLET | Freq: Once | ORAL | Status: AC
  Administered 2015-12-29: 650 mg via ORAL
  Filled 2015-12-29: qty 2

## 2015-12-29 NOTE — Progress Notes (Signed)
Molly Campbell tolerated chemo tx well without complaints. VSS upon discharge. Pt discharged self ambulatory in satisfactory condition with family

## 2015-12-29 NOTE — Patient Instructions (Addendum)
[place] at [facility] Discharge Instructions  RECOMMENDATIONS MADE BY THE CONSULTANT AND ANY TEST RESULTS WILL BE SENT TO YOUR REFERRING PHYSICIAN.  You saw Dr. Whitney Muse today. Follow up with Tom in one week with lab work. When your daughter picks you up today, she needs to speak to one of the nurses about your pain medication.  Thank you for choosing [place] at [facility] to provide your oncology and hematology care.  To afford each patient quality time with our provider, please arrive at least 15 minutes before your scheduled appointment time.   Beginning January 23rd 2017 lab work for the [facility] will be done in the  Main lab at [facility] on 1st floor. If you have a lab appointment with the [facility] please come in thru the  Main Entrance and check in at the main information desk  You need to re-schedule your appointment should you arrive 10 or more minutes late.  We strive to give you quality time with our providers, and arriving late affects you and other patients whose appointments are after yours.  Also, if you no show three or more times for appointments you may be dismissed from the clinic at the providers discretion.     Again, thank you for choosing [facility].  Our hope is that these requests will decrease the amount of time that you wait before being seen by our physicians.       _____________________________________________________________  Should you have questions after your visit to [facility], please contact our office at [phone] between the hours of 8:30 a.m. and 4:30 p.m.  Voicemails left after 4:30 p.m. will not be returned until the following business day.  For prescription refill requests, have your pharmacy contact our office.         Resources For Cancer Patients and their Caregivers ? American Cancer Society: Can assist with transportation, wigs, general needs,  runs Look Good Feel Better.        ([phone] [phone] ? Cancer Care: Provides financial assistance, online support groups, medication/co-pay assistance.  1-800-813-HOPE 817-213-4354) ? [place] Assists [place] cancer patients and their families through emotional , educational and financial support.  (301)324-7109 ? [place] DSS Where to apply for food stamps, Medicaid and utility assistance. (252) 034-9291 ? RCATS: Transportation to medical appointments. (510)499-8132 ? Social Security Administration: May apply for disability if have a Stage IV cancer. 3097872243 612-761-2058 ? [employer], Disability and Transit Services: Assists with nutrition, care and transit needs. [place] Support Programs: @10RELATIVEDAYS @ > Cancer Support Group  2nd Tuesday of the month 1pm-2pm, Journey Room  > Creative Journey  3rd Tuesday of the month 1130am-1pm, Journey Room  > Look Good Feel Better  1st Wednesday of the month 10am-12 noon, Journey Room (Call [place] to register [phone])

## 2015-12-29 NOTE — Progress Notes (Signed)
F/U with high risk H&N patient w/ PEG.    55 y/o female PMHx with lost of significant co morbidities: Very significant Tobacco abuse (48 pack year), severe etoh abuse (6 pack or 40 oz daily), illicit drug abuse,HTN, alcoholic hepatitis, COPD,Cervical cancer,GERD, anxiety, schizophrenia,mental retardation(2014)Esophageal Dysphagia (2015)  Contacted Pt by visiting during her infusion.  She is unaccompanied today.  Wt Readings from Last 10 Encounters:  12/29/15 108 lb 4.8 oz (49.1 kg)  12/27/15 111 lb (50.3 kg)  12/22/15 111 lb (50.3 kg)  12/15/15 109 lb 14.4 oz (49.9 kg)  12/13/15 109 lb (49.4 kg)  11/26/15 109 lb (49.4 kg)  11/25/15 109 lb (49.4 kg)  11/23/15 107 lb (48.5 kg)  11/22/15 107 lb 4.8 oz (48.7 kg)  11/22/15 107 lb (48.5 kg)   Patient weight has been overall stable since she was first seen.   Patient today reports that she HAS been able to place 3 cans of the TF VIA PEG each day. Her feeding regimen still sounds fairly random. She says depending on how she is feeling, she may place 1 can in her tube or  2. She says she flushes after each feed with 1 syringe.   She is still drinking/eating by mouth. She notes some residual mouth soreness from her dental extractions and says her food "has to be like baby food". She is still drinking the Ensure supplements as well, 2 a day.   RD expressed some concern over her maintaining hydration with only 240 cc in flushes daily. She denies dizziness and says her urine is clear. She has headaches that she says she forgot to tell the doctor about.   She asked if she should go back to administering the 16.9 oz bottle of water each day as she had done in the past. Asked her to do that, but at a time separate from her meals so she doesn't get too full.   Discussed with pt that we can continue with the 3 cans for now, though if she loses a couple more lbs, she will need to increase her tf. She has an appointment next week. Plan to reassess at  that time.    RD was very encouraged by the findings today. She currently is administering 3 cans via peg when RD was unsure if she would be able to do this.   Pt herself seems to be at her baseline, maybe with a slightly better disposition then last meeting. RD to continue to follow.   Burtis Junes RD, LDN, [facility] Pager: [phone] 12/29/2015 8:21 AM

## 2015-12-29 NOTE — Patient Instructions (Signed)
[facility] Discharge Instructions for Patients Receiving Chemotherapy   Beginning January 23rd 2017 lab work for the [facility] will be done in the  Main lab at [facility] on 1st floor. If you have a lab appointment with the [facility] please come in thru the  Main Entrance and check in at the main information desk   Today you received the following chemotherapy agents Cisplatin and Taxol. Follow-up as scheduled. Call clinic for any questions and concern    If you develop nausea and vomiting, or diarrhea that is not controlled by your medication, call the clinic.  The clinic phone number is [phone]. Office hours are Monday-Friday 8:30am-5:00pm.  BELOW ARE SYMPTOMS THAT SHOULD BE REPORTED IMMEDIATELY:  *FEVER GREATER THAN 101.0 F  *CHILLS WITH OR WITHOUT FEVER  NAUSEA AND VOMITING THAT IS NOT CONTROLLED WITH YOUR NAUSEA MEDICATION  *UNUSUAL SHORTNESS OF BREATH  *UNUSUAL BRUISING OR BLEEDING  TENDERNESS IN MOUTH AND THROAT WITH OR WITHOUT PRESENCE OF ULCERS  *URINARY PROBLEMS  *BOWEL PROBLEMS  UNUSUAL RASH Items with * indicate a potential emergency and should be followed up as soon as possible. If you have an emergency after office hours please contact your primary care physician or go to the nearest emergency department.  Please call the clinic during office hours if you have any questions or concerns.   You may also contact the Patient Navigator at [phone] should you have any questions or need assistance in obtaining follow up care.      Resources For Cancer Patients and their Caregivers ? American Cancer Society: Can assist with transportation, wigs, general needs, runs Look Good Feel Better.        260-081-7514 ? Cancer Care: Provides financial assistance, online support groups, medication/co-pay assistance.  1-800-813-HOPE 417 849 3057) ? [place] Assists [place] cancer patients and their families  through emotional , educational and financial support.  [phone] ? [place] DSS Where to apply for food stamps, Medicaid and utility assistance. 704-146-6707 ? RCATS: Transportation to medical appointments. 313-566-7014 ? Social Security Administration: May apply for disability if have a Stage IV cancer. [phone] ? [employer], Disability and Transit Services: Assists with nutrition, care and transit needs. [phone]

## 2015-12-29 NOTE — Progress Notes (Signed)
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      10/27/2015 Pathology Results    invasive squamous cell carcinoma, p16 NEGATIVE      11/10/2015 PET scan    Bilateral palatine tonsil enlargement and hypermetabolism, with asymmetric hypermetabolism in the right palatine tonsil, consistent with primary malignancy in this location. Bilateral mildly enlarged hypermetabolic level 2 neck lymph nodes.      11/18/2015 Procedure    IR Gastrostomy Tube, fluoroscopic 20 French Pull through Gastrostomy      11/27/2015 Procedure    CT-guided core biopsy performed of the bilobed shaped soft tissue mass anterior to the proximal stomach.      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      12/07/2015 Imaging    NM Myocar multi w spect- Nuclear stress EF: 70%.      12/22/2015 -  Chemotherapy    Cisplatin/Paclitaxel weekly        HISTORY OF PRESENTING ILLNESS: Molly Campbell 55 y.o. 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 She presents for ongoing chemotherapy.   Ms. Molly Campbell is unaccompanied. She is here for Cycle #2 Cisplatin / Paclitaxel.   She reports her throat hurts real bad. She has not been sleeping well at night.   When asked why she had used marijuana and cocaine recently, the patient admits it is partially to help with the pain and coping with her illness. She takes her pain medication as directed but it does not help very much. Realistically however she is a poor historian. She reports that she has only done cocaine one time since last week.   She ate food by mouth yesterday after mashing it up with baby food. She has been taking in 3 to 4 cans a day by feeding tube and "drinks the good ones".  No nausea or vomiting.    MEDICAL HISTORY:  Past Medical History:  Diagnosis Date  . Alcohol abuse   . Alcoholic hepatitis [id number]  . Anemia   . Anxiety disorder   . Asthma   . Auditory hallucinations   . Cancer (HCC)    cervical  . Chronic back pain   . Chronic bronchitis with acute exacerbation ([place])   . Cocaine abuse   . Constipation   . COPD (chronic obstructive pulmonary disease) ([place])   . Depression   . Dysrhythmia 2012   hx AF in er-cocaine-converted back NSR  . Emphysema   . GERD (gastroesophageal reflux disease)   . Headache   . History of cervical  cancer 09/26/2013  . Hypertension   . Malignant neoplasm of tonsillar fossa ([place]) 11/12/2015  . Pneumonia    hx  . Poor circulation   . Schizophrenia ([place])   . Scoliosis   . Tobacco abuse     SURGICAL HISTORY: Past Surgical History:  Procedure Laterality Date  . ABDOMINAL HYSTERECTOMY    . BREAST BIOPSY  2005   lt bx  . COLONOSCOPY  05/07/2002   Dr.Demason- large approximately 1.5cm polyp pedunculated aproximately 10cm from the anal verge bx= adenomatous polyp  . DIRECT LARYNGOSCOPY N/A 10/25/2015   Procedure: DIRECT LARYNGOSCOPY WITH BIOPSY OF ORAL PHARYNGEAL LESION;  Surgeon: Molly Baptist, MD;   Location: [facility];  Service: ENT;  Laterality: N/A;  DIRECT LARYNGOSCOPY WITH BIOPSY OF ORAL PHARYNGEAL LESION  . ESOPHAGOGASTRODUODENOSCOPY  06/02/2002   Dr. Anthony Campbell- mild distal esophagitis w/o stricturing or ulceration. stomach and pylorus are normal. inflammatory changes of proximal duldenum in the first portion.   . ESOPHAGOGASTRODUODENOSCOPY (EGD) WITH PROPOFOL N/A 11/23/2015   Procedure: ESOPHAGOGASTRODUODENOSCOPY (EGD) WITH PROPOFOL;  Surgeon: Molly Binder, MD;  [facility];  Service: Endoscopy;  Laterality: N/A;  1245  . FRACTURE SURGERY  right wrist   Molly Campbell  . MULTIPLE EXTRACTIONS WITH ALVEOLOPLASTY N/A 12/13/2015   Procedure: Extraction of tooth #'s 2,3,4,5,6,12,13,14,15,17,and 18 with alveoloplasty, bilateral maxillary tuberosity reductions, and gross debridement of remaining teeth;  Surgeon: Molly Campbell, DDS;  Location: [facility];  Service: Oral Surgery;  Laterality: N/A;  . PEG PLACEMENT  11/18/15  . PORTACATH PLACEMENT Right 11/18/15    SOCIAL HISTORY: Social History   Social History  . Marital status: Widowed    Spouse name: N/A  . Number of children: 3  . Years of education: N/A   Occupational History  . Not on file.   Social History Main Topics  . Smoking status: Current Every Day Smoker    Packs/day: 1.00    Years: 48.00    Types: Cigarettes  . Smokeless tobacco: Former Systems developer    Types: Snuff     Comment: One pack a day  . Alcohol use 0.0 oz/week     Comment: 40 oz a day  . Drug use:     Types: Cocaine, Marijuana     Comment: last used on Friday 8/18  . Sexual activity: Not Currently    Birth control/ protection: Surgical   Other Topics Concern  . Not on file   Social History Narrative  . No narrative on file   Born here in [place]. Daughter, 2 daughters. Lost her son. 10 grandchildren. No great-grandchildren yet. Used to work pulling tobacco. Is a smoker. Close to 2 ppd. Started smoking at the age of  28. She notes "I don't drink water." 2 40 oz bottles of beer a day. (?)  She says she does regular things for hobbies. Her daughter notes that she likes to go fishing.  FAMILY HISTORY: Family History  Problem Relation Age of Onset  . Alzheimer's disease Mother   . Other Father     "he was beat to death"  . Heart disease Sister   . Gout Sister   . COPD Sister   . Seizures Sister   . Diabetes Sister   . Diabetes Brother   . Cancer Paternal Grandmother     not sure what kind  . Seizures Sister   . Other Sister     "bones are messed up"  . Hypertension Brother   .  Heart attack Brother   . Other Brother     MVA  . Diabetes Brother   . Other Brother     MVA  . Alcohol abuse Daughter   . Other Son     motorcycle accident  . Heart failure Other    Mother & father deceased. Mother was 62 when she died. Had Alzheimer's. Father was younger. 4 brothers & 2 sisters living (and she's one of the sisters). There were 13 siblings total.  ALLERGIES:  is allergic to benicar [olmesartan]; ibuprofen; and latex.  MEDICATIONS:  Current Outpatient Prescriptions  Medication Sig Dispense Refill  . albuterol (PROVENTIL HFA;VENTOLIN HFA) 108 (90 BASE) MCG/ACT inhaler Inhale 1-2 puffs into the lungs every 6 (six) hours as needed for wheezing or shortness of breath. 1 Inhaler 0  . amLODipine (NORVASC) 5 MG tablet Take 5 mg by mouth daily.    Marland Kitchen CISPLATIN IV Inject into the vein.    Marland Kitchen dexamethasone (DECADRON) 4 MG tablet Take 2 tablets by mouth once a day on the day after chemotherapy and then take 2 tablets two times a day for 2 days. Take with food. 30 tablet 1  . lidocaine (XYLOCAINE) 2 % solution Mix 1 part 2%viscous lidocaine,1part H2O.Swish and swallow 72mL of this mixture,37min before meals and at bedtime, up to QID 100 mL 5  . lidocaine-prilocaine (EMLA) cream Apply to affected area once 30 g 3  . Multiple Vitamins-Minerals (MULTIVITAMIN) tablet Take 1 tablet by mouth daily. 180 tablet 1   . Nutritional Supplements (FEEDING SUPPLEMENT, OSMOLITE 1.5 Campbell,) LIQD 5 cans via PEG, split into 3 meals of 1.5, 1.5 and 2 cans. Flush with 150 cc water before and after each feed (Patient taking differently: by PEG Tube route See admin instructions. 5 cans via PEG, split into 3 meals of 1.5, 1.5 and 2 cans. Flush with 150 cc water before and after each feed)  0  . omeprazole (PRILOSEC) 20 MG capsule 1 PO EVERY MORNING 31 capsule 11  . ondansetron (ZOFRAN) 8 MG tablet Take 1 tablet (8 mg total) by mouth 2 (two) times daily as needed. Start on the third day after chemotherapy. 30 tablet 1  . PACLitaxel (TAXOL IV) Inject into the vein. weekly    . polyethylene glycol powder (GLYCOLAX/MIRALAX) powder Take one capsule and mix in 4-6 ounces of water, take one to two times daily as needed for constipation. 527 g 3  . prochlorperazine (COMPAZINE) 10 MG tablet Take 1 tablet (10 mg total) by mouth every 6 (six) hours as needed (Nausea or vomiting). 30 tablet 1  . sodium fluoride (FLUORISHIELD) 1.1 % GEL dental gel Instill one drop of gel per tooth space of fluoride tray. Place over teeth for 5 minutes. Remove. Spit out excess. Repeat nightly. 120 mL 11  . thiamine (VITAMIN B-1) 100 MG tablet Take 1 tablet (100 mg total) by mouth daily. 180 tablet 1  . HYDROcodone-acetaminophen (NORCO) 5-325 MG tablet Take 1-2 tablets by mouth every 6 (six) hours as needed for moderate pain or severe pain. 15 tablet 0  . nicotine (NICODERM CQ - DOSED IN MG/24 HOURS) 14 mg/24hr patch Place 1 patch (14 mg total) onto the skin daily. Apply 21 mg patch daily x 6 wk, then 14mg  patch daily x 2 wk, then 7 mg patch daily x 2 wk (Patient not taking: Reported on 12/29/2015) 14 patch 0  . nicotine (NICODERM CQ - DOSED IN MG/24 HOURS) 21 mg/24hr patch Place 1 patch (21 mg total)  onto the skin daily. Apply 21 mg patch daily x 6 wk, then 14mg  patch daily x 2 wk, then 7 mg patch daily x 2 wk (Patient not taking: Reported on 12/29/2015) 14  patch 2  . nicotine (NICODERM CQ - DOSED IN MG/24 HR) 7 mg/24hr patch Place 1 patch (7 mg total) onto the skin daily. Apply 21 mg patch daily x 6 wk, then 14mg  patch daily x 2 wk, then 7 mg patch daily x 2 wk (Patient not taking: Reported on 12/29/2015) 14 patch 0   No current facility-administered medications for this visit.    Facility-Administered Medications Ordered in Other Visits  Medication Dose Route Frequency Provider Last Rate Last Dose  . 0.9 %  sodium chloride infusion   Intravenous Continuous Manon Hilding Kefalas, PA-C      . 0.9 %  sodium chloride infusion   Intravenous Continuous Manon Hilding Kefalas, PA-C      . 0.9 %  sodium chloride infusion   Intravenous Continuous Manon Hilding Kefalas, PA-C      . 0.9 %  sodium chloride infusion   Intravenous Continuous Manon Hilding Kefalas, PA-C      . 0.9 %  sodium chloride infusion   Intravenous Continuous Manon Hilding Kefalas, PA-C      . 0.9 %  sodium chloride infusion   Intravenous Continuous Manon Hilding Kefalas, PA-C      . 0.9 %  sodium chloride infusion   Intravenous Continuous Manon Hilding Kefalas, PA-C      . sodium chloride flush (NS) 0.9 % injection 10 mL  10 mL Intracatheter PRN Patrici Ranks, MD   10 mL at 12/29/15 0840    Review of Systems  Constitutional: Negative.   HENT: Positive for sore throat.   Eyes: Negative.   Respiratory: Negative.   Cardiovascular: Negative.   Genitourinary: Negative.   Musculoskeletal: Positive for back pain.  Skin: Negative.   Neurological: Negative.   Endo/Heme/Allergies: Negative.   Psychiatric/Behavioral: Positive for depression and substance abuse.       Recent marijuana and cocaine use.  All other systems reviewed and are negative.  14 point ROS was done and is otherwise as detailed above or in HPI   PHYSICAL EXAMINATION: ECOG PERFORMANCE STATUS: 1 - Symptomatic but completely ambulatory  Vitals:   12/29/15 0819  BP: 116/74  Pulse: 95  Resp: 16  Temp: 98.8 F (37.1 C)   Filed Weights    12/29/15 0819  Weight: 108 lb 4.8 oz (49.1 kg)    Physical Exam  Constitutional: She is oriented to person, place, and time and well-developed, well-nourished, and in no distress.  HENT:  Head: Normocephalic and atraumatic.  Mouth/Throat: Oropharynx is clear and moist.  Glossal palatine arch is gone  Eyes: Conjunctivae and EOM are normal. Pupils are equal, round, and reactive to light. Right eye exhibits no discharge. Left eye exhibits no discharge. No scleral icterus.  Neck: Normal range of motion. Neck supple. No JVD present. No tracheal deviation present. No thyromegaly present.  Cardiovascular: Normal rate, regular rhythm and normal heart sounds.   Pulmonary/Chest: Effort normal and breath sounds normal. No stridor. No respiratory distress. She has no wheezes. She has no rales. She exhibits no tenderness.  Abdominal: Soft. Bowel sounds are normal. She exhibits no distension. There is tenderness. There is no rebound.  Gastrostomy tube bandaged, looked fine.   Musculoskeletal: Normal range of motion. She exhibits no edema or deformity.  Lymphadenopathy:    She has no  cervical adenopathy.  Neurological: She is alert and oriented to person, place, and time. No cranial nerve deficit. Gait normal.  Skin: Skin is warm and dry. No rash noted. No erythema. No pallor.  Nursing note and vitals reviewed.   LABORATORY DATA:  I have reviewed the data as listed Lab Results  Component Value Date   WBC 6.6 12/29/2015   HGB 12.0 12/29/2015   HCT 35.2 (L) 12/29/2015   MCV 94.4 12/29/2015   PLT 614 (H) 12/29/2015   CMP     Component Value Date/Time   NA 134 (L) 12/29/2015 0840   K 4.0 12/29/2015 0840   CL 102 12/29/2015 0840   CO2 26 12/29/2015 0840   GLUCOSE 109 (H) 12/29/2015 0840   BUN 15 12/29/2015 0840   CREATININE 0.65 12/29/2015 0840   CALCIUM 8.5 (L) 12/29/2015 0840   PROT 6.6 12/29/2015 0840   ALBUMIN 3.6 12/29/2015 0840   AST 18 12/29/2015 0840   ALT 15 12/29/2015 0840    ALKPHOS 71 12/29/2015 0840   BILITOT 0.4 12/29/2015 0840   GFRNONAA >60 12/29/2015 0840   GFRAA >60 12/29/2015 0840    PATHOLOGY:     RADIOGRAPHIC STUDIES: I have personally reviewed the radiological images as listed and agreed with the findings in the report.  CLINICAL DATA:  Initial treatment strategy for right tonsillar squamous cell carcinoma diagnosed 10/25/2015.  EXAM: NUCLEAR MEDICINE PET SKULL BASE TO THIGH  TECHNIQUE: 5.7 mCi F-18 FDG was injected intravenously. Full-ring PET imaging was performed from the skull base to thigh after the radiotracer. CT data was obtained and used for attenuation correction and anatomic localization.  FASTING BLOOD GLUCOSE:  Value: 110 mg/dl  COMPARISON:  10/05/2015 neck CT.  FINDINGS: NECK  There is symmetric mild enlargement of the palatine tonsils bilaterally. There is asymmetric hypermetabolism in the right palatine tonsil with max SUV 9.1. There is hypermetabolism in the left palatine tonsil with max SUV 7.6.  Mildly enlarged hypermetabolic 1.0 cm right level 2 lymph node near the angle of the mandible with max SUV 5.0 (series 4/image 21).  Mildly enlarged hypermetabolic 1.0 cm left level 2 lymph node near the angle of the mandible with max SUV 4.4 (series 4/image 23).  No additional hypermetabolic lymph nodes in the neck.  CHEST  There is a 0.7 cm left upper lobe pulmonary nodule (series 6/image 23) with associated mild metabolism with max SUV 1.9 (which is considered significant for a nodule of this small size). At least 3 additional smaller pulmonary nodules in the right upper lobe and left lower lobe are below PET resolution. No acute consolidative airspace disease.  No hypermetabolic axillary, mediastinal or hilar nodes. Left anterior descending, left circumflex and right coronary atherosclerosis. Atherosclerotic nonaneurysmal thoracic aorta. Mild centrilobular and paraseptal emphysema with mild  diffuse bronchial wall thickening.  ABDOMEN/PELVIS  There is a lobulated solid homogeneous 4.9 x 2.5 cm hypermetabolic exophytic anterior proximal gastric mass with max SUV 24.8 (series 4/image 99). Stomach is collapsed and otherwise grossly normal.  No abnormal hypermetabolic activity within the liver, pancreas, adrenal glands, or spleen. No hypermetabolic lymph nodes in the abdomen or pelvis. Atherosclerotic nonaneurysmal abdominal aorta.  SKELETON  No focal hypermetabolic activity to suggest skeletal metastasis.  IMPRESSION: 1. Bilateral palatine tonsil enlargement and hypermetabolism, with asymmetric hypermetabolism in the right palatine tonsil, consistent with primary malignancy in this location. Malignant involvement of the contralateral (left) palatine tonsil cannot be excluded. 2. Bilateral mildly enlarged hypermetabolic level 2 neck lymph nodes, suspicious  for nodal metastases. 3. Four subcentimeter pulmonary nodules scattered in both lungs, the largest of which demonstrates mild metabolism (max SUV 1.9), which is considered significant for a nodule of this small size, and raises concern for pulmonary metastases. 4. Solid lobulated hypermetabolic 4.9 x 2.5 cm gastric mass arising exophytically from the anterior proximal stomach, most suggestive of a gastric GIST. Tissue sampling is advised for this mass, probably best obtained via upper endoscopic approach. 5. Three-vessel coronary atherosclerosis.  Aortic atherosclerosis. 6. Mild centrilobular and paraseptal emphysema with mild diffuse bronchial wall thickening, suggesting COPD.   Electronically Signed   By: Ilona Sorrel M.D.   On: 11/10/2015 15:10   ASSESSMENT & PLAN: Malignant neoplasm of tonsillar fossa (HCC)   Staging form: Pharynx - Oropharynx, AJCC 7th Edition   - Clinical: Stage IVA (T3, N2c, M0) - Signed by Molly Gibson, MD on 11/12/2015 Cervical cancer in 1985, treated surgically with a partial  hysterectomy. EtOH abuse, still drinking, two 40 oz beer/day Tobacco abuse, still smoking 2 ppd Cocaine abuse  Alcoholic hepatitis Mental retardation GERD  Gastric GIST  She tolerated her first cycle of chemotherapy without difficulty. She is here today for cycle #2. Labs reviewed, she is ok to proceed.  Her daughter came to pick her up after therapy, she and I spoke at great length about my concerns with the patient's uncontrolled pain, but her ongoing alcohol use and intermittent cocaine use. She understands clearly the problems with pain medication, alcohol and cocaine combined. We opted to write a pain prescription for 15 pain pills. Her daughter notes that she will give her mother two to three a day and see how she does. She notes that her mother does complain of pain. She is not sure if she is drinking more because of her pain.  Her daughter also notes that she went to pick her mother up with the other day for a doctor's appointment and she was hiding in the house. This is not typical behavior for her. Maguadalupe seemed at her baseline mentally today.   She will return in one week for ongoing therapy.  I do not feel comfortable giving her higher doses of therapy, I think weekly therapy with close observation at this point is the best way to proceed.  Per Dr. Lanell Persons visit:   All questions were answered. The patient knows to call the clinic with any problems, questions or concerns.  This document serves as a record of services personally performed by Ancil Linsey, MD. It was created on her behalf by Arlyce Harman, a trained medical scribe. The creation of this record is based on the scribe's personal observations and the provider's statements to them. This document has been checked and approved by the attending provider.  I have reviewed the above documentation for accuracy and completeness and I agree with the above.  This note was electronically signed.    Molli Hazard, MD  12/29/2015 5:49 PM

## 2015-12-30 ENCOUNTER — Telehealth (HOSPITAL_COMMUNITY): Payer: Self-pay | Admitting: Hematology & Oncology

## 2015-12-31 ENCOUNTER — Encounter (HOSPITAL_COMMUNITY)
Admission: RE | Admit: 2015-12-31 | Discharge: 2015-12-31 | Disposition: A | Discharge status: Home / Self Care | Payer: Medicaid Other | Source: Ambulatory Visit | Attending: Hematology & Oncology | Admitting: Hematology & Oncology

## 2015-12-31 DIAGNOSIS — C090 Malignant neoplasm of tonsillar fossa: Secondary | ICD-10-CM | POA: Diagnosis not present

## 2015-12-31 MED ORDER — HEPARIN SOD (PORK) LOCK FLUSH 100 UNIT/ML IV SOLN
INTRAVENOUS | Status: AC
  Filled 2015-12-31: qty 5

## 2015-12-31 MED ORDER — HEPARIN SOD (PORK) LOCK FLUSH 100 UNIT/ML IV SOLN
500.0000 [IU] | INTRAVENOUS | Status: AC | PRN
  Administered 2015-12-31: 500 [IU]

## 2015-12-31 MED ORDER — SODIUM CHLORIDE 0.9 % IV SOLN
INTRAVENOUS | Status: DC
  Administered 2015-12-31: 13:00:00 via INTRAVENOUS

## 2016-01-03 ENCOUNTER — Encounter (HOSPITAL_COMMUNITY)
Admission: RE | Admit: 2016-01-03 | Discharge: 2016-01-03 | Disposition: A | Discharge status: Home / Self Care | Payer: Medicaid Other | Source: Ambulatory Visit | Attending: Hematology & Oncology | Admitting: Hematology & Oncology

## 2016-01-03 DIAGNOSIS — C090 Malignant neoplasm of tonsillar fossa: Secondary | ICD-10-CM | POA: Diagnosis not present

## 2016-01-03 MED ORDER — HEPARIN SOD (PORK) LOCK FLUSH 100 UNIT/ML IV SOLN
500.0000 [IU] | INTRAVENOUS | Status: AC | PRN
  Administered 2016-01-03: 500 [IU]

## 2016-01-03 MED ORDER — SODIUM CHLORIDE 0.9 % IV SOLN
INTRAVENOUS | Status: DC
  Administered 2016-01-03: 13:00:00 via INTRAVENOUS

## 2016-01-03 MED ORDER — HEPARIN SOD (PORK) LOCK FLUSH 100 UNIT/ML IV SOLN
INTRAVENOUS | Status: AC
  Filled 2016-01-03: qty 5

## 2016-01-04 ENCOUNTER — Ambulatory Visit (HOSPITAL_COMMUNITY): Payer: Self-pay

## 2016-01-04 ENCOUNTER — Ambulatory Visit (HOSPITAL_COMMUNITY): Payer: Self-pay | Admitting: Hematology & Oncology

## 2016-01-05 ENCOUNTER — Encounter (HOSPITAL_BASED_OUTPATIENT_CLINIC_OR_DEPARTMENT_OTHER): Payer: Medicaid Other

## 2016-01-05 ENCOUNTER — Encounter (HOSPITAL_BASED_OUTPATIENT_CLINIC_OR_DEPARTMENT_OTHER): Payer: Medicaid Other | Admitting: Oncology

## 2016-01-05 ENCOUNTER — Encounter (HOSPITAL_COMMUNITY): Payer: Self-pay | Admitting: Oncology

## 2016-01-05 ENCOUNTER — Encounter: Payer: Self-pay | Admitting: Dietician

## 2016-01-05 VITALS — BP 123/87 | HR 80 | Temp 98.1°F | Resp 18 | Wt 110.2 lb

## 2016-01-05 DIAGNOSIS — C090 Malignant neoplasm of tonsillar fossa: Secondary | ICD-10-CM | POA: Diagnosis present

## 2016-01-05 DIAGNOSIS — M25512 Pain in left shoulder: Secondary | ICD-10-CM | POA: Diagnosis not present

## 2016-01-05 DIAGNOSIS — M25511 Pain in right shoulder: Secondary | ICD-10-CM | POA: Diagnosis not present

## 2016-01-05 DIAGNOSIS — F1410 Cocaine abuse, uncomplicated: Secondary | ICD-10-CM

## 2016-01-05 DIAGNOSIS — Z5111 Encounter for antineoplastic chemotherapy: Secondary | ICD-10-CM | POA: Diagnosis present

## 2016-01-05 LAB — CBC WITH DIFFERENTIAL/PLATELET
BASOS ABS: 0 10*3/uL (ref 0.0–0.1)
Basophils Relative: 1 %
EOS PCT: 1 %
Eosinophils Absolute: 0.1 10*3/uL (ref 0.0–0.7)
HCT: 32 % — ABNORMAL LOW (ref 36.0–46.0)
HEMOGLOBIN: 11.1 g/dL — AB (ref 12.0–15.0)
Lymphocytes Relative: 33 %
Lymphs Abs: 2.5 10*3/uL (ref 0.7–4.0)
MCH: 32.6 pg (ref 26.0–34.0)
MCHC: 34.7 g/dL (ref 30.0–36.0)
MCV: 94.1 fL (ref 78.0–100.0)
MONO ABS: 0.5 10*3/uL (ref 0.1–1.0)
Monocytes Relative: 7 %
NEUTROS ABS: 4.6 10*3/uL (ref 1.7–7.7)
Neutrophils Relative %: 58 %
Platelets: 491 10*3/uL — ABNORMAL HIGH (ref 150–400)
RBC: 3.4 MIL/uL — AB (ref 3.87–5.11)
RDW: 12.6 % (ref 11.5–15.5)
WBC: 7.8 10*3/uL (ref 4.0–10.5)

## 2016-01-05 LAB — COMPREHENSIVE METABOLIC PANEL
ALBUMIN: 3.7 g/dL (ref 3.5–5.0)
ALK PHOS: 90 U/L (ref 38–126)
ALT: 13 U/L — ABNORMAL LOW (ref 14–54)
ANION GAP: 10 (ref 5–15)
AST: 16 U/L (ref 15–41)
BUN: 8 mg/dL (ref 6–20)
CALCIUM: 8.9 mg/dL (ref 8.9–10.3)
CO2: 22 mmol/L (ref 22–32)
Chloride: 106 mmol/L (ref 101–111)
Creatinine, Ser: 0.52 mg/dL (ref 0.44–1.00)
GFR calc Af Amer: >60 mL/min (ref >60)
GFR calc non Af Amer: >60 mL/min (ref >60)
GLUCOSE: 127 mg/dL — AB (ref 65–99)
Potassium: 3.3 mmol/L — ABNORMAL LOW (ref 3.5–5.1)
SODIUM: 138 mmol/L (ref 135–145)
Total Bilirubin: 0.5 mg/dL (ref 0.3–1.2)
Total Protein: 6.6 g/dL (ref 6.5–8.1)

## 2016-01-05 LAB — RAPID URINE DRUG SCREEN, HOSP PERFORMED
Amphetamines: NOT DETECTED
BARBITURATES: NOT DETECTED
BENZODIAZEPINES: NOT DETECTED
COCAINE: NOT DETECTED
Opiates: POSITIVE — AB
TETRAHYDROCANNABINOL: NOT DETECTED

## 2016-01-05 LAB — MAGNESIUM: Magnesium: 1.6 mg/dL — ABNORMAL LOW (ref 1.7–2.4)

## 2016-01-05 MED ORDER — DIPHENHYDRAMINE HCL 50 MG/ML IJ SOLN
50.0000 mg | Freq: Once | INTRAMUSCULAR | Status: AC
  Administered 2016-01-05: 50 mg via INTRAVENOUS
  Filled 2016-01-05: qty 1

## 2016-01-05 MED ORDER — HEPARIN SOD (PORK) LOCK FLUSH 100 UNIT/ML IV SOLN
500.0000 [IU] | Freq: Once | INTRAVENOUS | Status: AC | PRN
  Administered 2016-01-05: 500 [IU]
  Filled 2016-01-05 (×2): qty 5

## 2016-01-05 MED ORDER — PACLITAXEL CHEMO INJECTION 300 MG/50ML
80.0000 mg/m2 | Freq: Once | INTRAVENOUS | Status: AC
  Administered 2016-01-05: 120 mg via INTRAVENOUS
  Filled 2016-01-05: qty 20

## 2016-01-05 MED ORDER — FAMOTIDINE IN NACL 20-0.9 MG/50ML-% IV SOLN
20.0000 mg | Freq: Once | INTRAVENOUS | Status: AC
  Administered 2016-01-05: 20 mg via INTRAVENOUS
  Filled 2016-01-05: qty 50

## 2016-01-05 MED ORDER — SODIUM CHLORIDE 0.9% FLUSH: 10.0000 mL | INTRAVENOUS | Status: DC | PRN

## 2016-01-05 MED ORDER — HYDROCODONE-ACETAMINOPHEN 5-325 MG PO TABS: 1.0000 | ORAL_TABLET | Freq: Four times a day (QID) | ORAL | 0 refills | Status: DC | PRN

## 2016-01-05 MED ORDER — PALONOSETRON HCL INJECTION 0.25 MG/5ML
0.2500 mg | Freq: Once | INTRAVENOUS | Status: AC
  Administered 2016-01-05: 0.25 mg via INTRAVENOUS
  Filled 2016-01-05: qty 5

## 2016-01-05 MED ORDER — POTASSIUM CHLORIDE 2 MEQ/ML IV SOLN
Freq: Once | INTRAVENOUS | Status: AC
  Administered 2016-01-05: 09:00:00 via INTRAVENOUS
  Filled 2016-01-05: qty 10

## 2016-01-05 MED ORDER — FOSAPREPITANT DIMEGLUMINE INJECTION 150 MG
Freq: Once | INTRAVENOUS | Status: AC
  Administered 2016-01-05: 11:00:00 via INTRAVENOUS
  Filled 2016-01-05: qty 5

## 2016-01-05 MED ORDER — ACETAMINOPHEN 325 MG PO TABS
650.0000 mg | ORAL_TABLET | Freq: Once | ORAL | Status: AC
  Administered 2016-01-05: 650 mg via ORAL
  Filled 2016-01-05: qty 2

## 2016-01-05 MED ORDER — SODIUM CHLORIDE 0.9 % IV SOLN
40.0000 mg/m2 | Freq: Once | INTRAVENOUS | Status: AC
  Administered 2016-01-05: 59 mg via INTRAVENOUS
  Filled 2016-01-05: qty 59

## 2016-01-05 NOTE — Patient Instructions (Signed)
[facility] at [facility] Discharge Instructions  RECOMMENDATIONS MADE BY THE CONSULTANT AND ANY TEST RESULTS WILL BE SENT TO YOUR REFERRING PHYSICIAN.  You were seen by Gershon Mussel today. Prescription given for Hydrocodone Anti-diarrhea regimen/instructions Continue IV fluids as scheduled  Return to center as scheduled.  Thank you for choosing [place] at [facility] to provide your oncology and hematology care.  To afford each patient quality time with our provider, please arrive at least 15 minutes before your scheduled appointment time.   Beginning January 23rd 2017 lab work for the [facility] will be done in the  Main lab at [facility] on 1st floor. If you have a lab appointment with the [facility] please come in thru the  Main Entrance and check in at the main information desk  You need to re-schedule your appointment should you arrive 10 or more minutes late.  We strive to give you quality time with our providers, and arriving late affects you and other patients whose appointments are after yours.  Also, if you no show three or more times for appointments you may be dismissed from the clinic at the providers discretion.     Again, thank you for choosing [facility]  Our hope is that these requests will decrease the amount of time that you wait before being seen by our physicians.       _____________________________________________________________  Should you have questions after your visit to [facility], please contact our office at [phone] between the hours of 8:30 a.m. and 4:30 p.m.  Voicemails left after 4:30 p.m. will not be returned until the following business day.  For prescription refill requests, have your pharmacy contact our office.         Resources For Cancer Patients and their Caregivers ? American Cancer Society: Can assist with transportation, wigs, general needs, runs Look Good Feel  Better.        ([phone] ? Cancer Care: Provides financial assistance, online support groups, medication/co-pay assistance.  1-800-813-HOPE 561-294-5133) ? [facility] Assists [place] cancer patients and their families through emotional , educational and financial support.  8785201344 ? [place] DSS Where to apply for food stamps, Medicaid and utility assistance. 7252346126 ? [facility]: Transportation to medical appointments. 4785544981 ? Social Security Administration: May apply for disability if have a Stage IV cancer. 626-763-8874 438 524 2648 ? [employer], Disability and Transit Services: Assists with nutrition, care and transit needs. [place] Support Programs: @10RELATIVEDAYS @ > Cancer Support Group  2nd Tuesday of the month 1pm-2pm, Journey Room  > Creative Journey  3rd Tuesday of the month 1130am-1pm, Journey Room  > Look Good Feel Better  1st Wednesday of the month 10am-12 noon, Journey Room (Call [place] to register [phone])

## 2016-01-05 NOTE — Progress Notes (Signed)
Follow up with tube fed supplemented, high risk H&N patient   Contacted Pt by visiting during infusion  Wt Readings from Last 10 Encounters:  01/05/16 110 lb 3.2 oz (50 kg)  12/31/15 108 lb (49 kg)  12/29/15 108 lb 4.8 oz (49.1 kg)  12/27/15 111 lb (50.3 kg)  12/22/15 111 lb (50.3 kg)  12/15/15 109 lb 14.4 oz (49.9 kg)  12/13/15 109 lb (49.4 kg)  11/26/15 109 lb (49.4 kg)  11/25/15 109 lb (49.4 kg)  11/23/15 107 lb (48.5 kg)   Patient weight has remained stable, though she is only receiving her second chemo treatment today.   Pt states that on Friday evening, she experienced severe diarrhea that lasted until Monday. Because of this, she says she did not use her tube at all 3 out of the 4 days, not even for free water flushes.   She is still eating by mouth well. She says she is eating 3 meals each day. Her meals have not decreased in size. Additionally, she is consuming two Ensure supplements a day. She notes she is about out of her case, will order another for her next appointment.   Her food still needs to be mashed up/soft due to dental pain. She gave examples of mashed potatoes and collards greens yesterday.   Pt believes her diarrhea came as a result of the IVF on 5/25 she received Friday, though potentially could have been from the chemo she had received the week prior?   She is advised that now that her diarrhea has resolved, she should resume her TF and flushes, mainly to keep the habit established. Despite the 4 days of diarrhea, she has gained weight, which speaks to her good appetite. She is seen today with a large number of snacks in front of her as well as her normal coffee container. As RD was leaving, pt was being served another cup of coffee with multiple creamers. This is good.   Hopefully, diarrhea was only acute instance and will not be an ongoing problem as it cost her 4 days of TF.    Burtis Junes RD, LDN, [facility] Pager: [phone] 01/05/2016 11:03  AM

## 2016-01-05 NOTE — Patient Instructions (Signed)
[facility] Discharge Instructions for Patients Receiving Chemotherapy   Beginning January 23rd 2017 lab work for the [facility] will be done in the  Main lab at [facility] on 1st floor. If you have a lab appointment with the [facility] please come in thru the  Main Entrance and check in at the main information desk   Today you received the following chemotherapy agents:  Taxol and cisplatin  If you develop nausea and vomiting, or diarrhea that is not controlled by your medication, call the clinic.  The clinic phone number is [phone]. Office hours are Monday-Friday 8:30am-5:00pm.  BELOW ARE SYMPTOMS THAT SHOULD BE REPORTED IMMEDIATELY:  *FEVER GREATER THAN 101.0 F  *CHILLS WITH OR WITHOUT FEVER  NAUSEA AND VOMITING THAT IS NOT CONTROLLED WITH YOUR NAUSEA MEDICATION  *UNUSUAL SHORTNESS OF BREATH  *UNUSUAL BRUISING OR BLEEDING  TENDERNESS IN MOUTH AND THROAT WITH OR WITHOUT PRESENCE OF ULCERS  *URINARY PROBLEMS  *BOWEL PROBLEMS  UNUSUAL RASH Items with * indicate a potential emergency and should be followed up as soon as possible. If you have an emergency after office hours please contact your primary care physician or go to the nearest emergency department.  Please call the clinic during office hours if you have any questions or concerns.   You may also contact the Patient Navigator at [phone] should you have any questions or need assistance in obtaining follow up care.      Resources For Cancer Patients and their Caregivers ? American Cancer Society: Can assist with transportation, wigs, general needs, runs Look Good Feel Better.        726-356-8110 ? Cancer Care: Provides financial assistance, online support groups, medication/co-pay assistance.  1-800-813-HOPE (343)338-3512) ? [facility] Assists [place] cancer patients and their families through emotional , educational and financial support.   (530)430-0245 ? [place] DSS Where to apply for food stamps, Medicaid and utility assistance. 818-033-1451 ? [facility]: Transportation to medical appointments. 786-091-6162 ? Social Security Administration: May apply for disability if have a Stage IV cancer. 619-207-2901 715-721-4367 ? [employer], Disability and Transit Services: Assists with nutrition, care and transit needs. 7740281058

## 2016-01-05 NOTE — Progress Notes (Signed)
Tolerated tx w/o adverse reaction.  Alert, in no distress.  VSS.  Discharged ambulatory. 

## 2016-01-05 NOTE — Progress Notes (Signed)
[name], MD [place] [address]  Malignant neoplasm of tonsillar fossa (Molly Campbell) - Plan: HYDROcodone-acetaminophen (Campbell) 5-325 MG tablet  Drug abuse, cocaine type - Plan: Drug Screen, Urine  CURRENT THERAPY: Weekly Cisplatin/Paclitaxel beginning on 12/22/2015  INTERVAL HISTORY: Molly Campbell 55 y.o. 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  Starting systemic chemotherapy with weekly Cisplatin/Paclitaxel on 12/22/2015.    Malignant neoplasm of tonsillar fossa (Molly Campbell)   10/05/2015 Imaging    CT imaging Molly Campbell, bilateral palatine tonsil enlargement with asymmetric abnormality of R tonsil and soft palate, possible R cervical adenopathy      10/25/2015 Procedure    Direct laryngoscopy and biopsyof right tonsillar mass.      10/27/2015 Pathology Results    invasive squamous cell carcinoma, p16 NEGATIVE      11/10/2015 PET scan    Bilateral palatine tonsil enlargement and hypermetabolism, with asymmetric hypermetabolism in the right palatine tonsil, consistent with primary malignancy in this location. Bilateral mildly enlarged hypermetabolic level 2 neck lymph nodes.      11/18/2015 Procedure    IR Gastrostomy Tube, fluoroscopic 20 French Pull through Gastrostomy      11/27/2015 Procedure    CT-guided core biopsy performed of the bilobed shaped soft tissue mass anterior to the proximal stomach.      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      12/07/2015 Imaging    NM Myocar multi w spect- Nuclear stress EF: 70%.      12/22/2015 -  Chemotherapy    Cisplatin/Paclitaxel weekly      She reports a 3 day history of loose stools.  She admits that they woke her during night sleeping.  She denies any incontinence.  She reports that is began last Friday and resolved by Monday.  She notes that  she did not take any medication for this.  She also notes bilateral shoulder pain.  She reports that her daughter lost her pain medication Rx.    Review of Systems  Constitutional: Negative.  Negative for chills and fever.  HENT: Negative.   Eyes: Negative.  Negative for blurred vision.  Respiratory: Negative.  Negative for cough.   Cardiovascular: Negative.  Negative for chest pain.  Gastrointestinal: Positive for diarrhea (3 day history ). Negative for constipation, nausea and vomiting.  Genitourinary: Negative.   Musculoskeletal: Positive for joint pain (bilateral shoulders).  Skin: Negative.   Neurological: Negative.  Negative for weakness and headaches.  Endo/Heme/Allergies: Negative.   Psychiatric/Behavioral: Negative.   All other systems reviewed and are negative.   Past Medical History:  Diagnosis Date  . Alcohol abuse   . Alcoholic hepatitis [id number]  . Anemia   . Anxiety disorder   . Asthma   . Auditory hallucinations   . Cancer (HCC)    cervical  . Chronic back pain   . Chronic bronchitis with acute exacerbation (Molly Campbell)   . Cocaine abuse   . Constipation   . COPD (chronic obstructive pulmonary disease) (Molly Campbell)   . Depression   . Dysrhythmia 2012   hx AF in er-cocaine-converted back NSR  . Emphysema   . GERD (gastroesophageal reflux disease)   . Headache   . History of cervical cancer 09/26/2013  . Hypertension   . Malignant neoplasm of tonsillar fossa (Molly Campbell) 11/12/2015  . Pneumonia    hx  .  Poor circulation   . Schizophrenia (Molly Campbell)   . Scoliosis   . Tobacco abuse     Past Surgical History:  Procedure Laterality Date  . ABDOMINAL HYSTERECTOMY    . BREAST BIOPSY  2005   lt bx  . COLONOSCOPY  05/07/2002   Dr.Demason- large approximately 1.5cm polyp pedunculated aproximately 10cm from the anal verge bx= adenomatous polyp  . DIRECT LARYNGOSCOPY N/A 10/25/2015   Procedure: DIRECT LARYNGOSCOPY WITH BIOPSY OF ORAL PHARYNGEAL LESION;  Surgeon: Leta Baptist, MD;   Location: [facility];  Service: ENT;  Laterality: N/A;  DIRECT LARYNGOSCOPY WITH BIOPSY OF ORAL PHARYNGEAL LESION  . ESOPHAGOGASTRODUODENOSCOPY  06/02/2002   Dr. Anthony Sar- mild distal esophagitis w/o stricturing or ulceration. stomach and pylorus are normal. inflammatory changes of proximal duldenum in the first portion.   . ESOPHAGOGASTRODUODENOSCOPY (EGD) WITH PROPOFOL N/A 11/23/2015   Procedure: ESOPHAGOGASTRODUODENOSCOPY (EGD) WITH PROPOFOL;  Surgeon: Danie Binder, MD;  [facility];  Service: Endoscopy;  Laterality: N/A;  1245  . FRACTURE SURGERY  right wrist   [place]  . MULTIPLE EXTRACTIONS WITH ALVEOLOPLASTY N/A 12/13/2015   Procedure: Extraction of tooth #'s 2,3,4,5,6,12,13,14,15,17,and 18 with alveoloplasty, bilateral maxillary tuberosity reductions, and gross debridement of remaining teeth;  Surgeon: Lenn Cal, DDS;  Location: [facility];  Service: Oral Surgery;  Laterality: N/A;  . PEG PLACEMENT  11/18/15  . PORTACATH PLACEMENT Right 11/18/15    Family History  Problem Relation Age of Onset  . Alzheimer's disease Mother   . Other Father     "he was beat to death"  . Heart disease Sister   . Gout Sister   . COPD Sister   . Seizures Sister   . Diabetes Sister   . Diabetes Brother   . Cancer Paternal Grandmother     not sure what kind  . Seizures Sister   . Other Sister     "bones are messed up"  . Hypertension Brother   . Heart attack Brother   . Other Brother     MVA  . Diabetes Brother   . Other Brother     MVA  . Alcohol abuse Daughter   . Other Son     motorcycle accident  . Heart failure Other     Social History   Social History  . Marital status: Widowed    Spouse name: N/A  . Number of children: 3  . Years of education: N/A   Social History Main Topics  . Smoking status: Current Every Day Smoker    Packs/day: 1.00    Years: 48.00    Types: Cigarettes  . Smokeless tobacco: Former Systems developer    Types: Snuff     Comment: One  pack a day  . Alcohol use 0.0 oz/week     Comment: 40 oz a day  . Drug use:     Types: Cocaine, Marijuana     Comment: last used on Friday 8/18  . Sexual activity: Not Currently    Birth control/ protection: Surgical   Other Topics Concern  . None   Social History Narrative  . None     PHYSICAL EXAMINATION  ECOG PERFORMANCE STATUS: 1 - Symptomatic but completely ambulatory  There were no vitals filed for this visit.  BP 127/89 P 96 T 99 R 18 Weight 110.2 lbs  GENERAL:alert, no distress, well nourished, well developed, comfortable, cooperative, smiling and in chemo-bed, unaccompanied SKIN: skin color, texture, turgor are normal, no rashes or significant  lesions HEAD: Normocephalic, No masses, lesions, tenderness or abnormalities EYES: normal, EOMI, Conjunctiva are pink and non-injected EARS: External ears normal OROPHARYNX:lips, buccal mucosa, and tongue normal and mucous membranes are moist  NECK: supple, trachea midline LYMPH:  no palpable lymphadenopathy BREAST:not examined LUNGS: clear to auscultation  HEART: regular rate & rhythm ABDOMEN:abdomen soft and normal bowel sounds BACK: Back symmetric, no curvature. EXTREMITIES:less then 2 second capillary refill, no joint deformities, effusion, or inflammation, no skin discoloration, no cyanosis  NEURO: alert & oriented x 3 with fluent speech, no focal motor/sensory deficits, gait normal  LABORATORY DATA: CBC    Component Value Date/Time   WBC 7.8 01/05/2016 0908   RBC 3.40 (L) 01/05/2016 0908   HGB 11.1 (L) 01/05/2016 0908   HCT 32.0 (L) 01/05/2016 0908   PLT 491 (H) 01/05/2016 0908   MCV 94.1 01/05/2016 0908   MCH 32.6 01/05/2016 0908   MCHC 34.7 01/05/2016 0908   RDW 12.6 01/05/2016 0908   LYMPHSABS 2.5 01/05/2016 0908   MONOABS 0.5 01/05/2016 0908   EOSABS 0.1 01/05/2016 0908   BASOSABS 0.0 01/05/2016 0908      Chemistry      Component Value Date/Time   NA 134 (L) 12/29/2015 0840   K 4.0 12/29/2015  0840   CL 102 12/29/2015 0840   CO2 26 12/29/2015 0840   BUN 15 12/29/2015 0840   CREATININE 0.65 12/29/2015 0840   GLU 99 09/17/2015      Component Value Date/Time   CALCIUM 8.5 (L) 12/29/2015 0840   ALKPHOS 71 12/29/2015 0840   AST 18 12/29/2015 0840   ALT 15 12/29/2015 0840   BILITOT 0.4 12/29/2015 0840        PENDING LABS:   RADIOGRAPHIC STUDIES:  Nm Myocar Multi W/spect W/wall Motion / Ef  Result Date: 12/07/2015  No clearly diagnostic ST segment changes to indicate ischemia, however progressive T-wave inversion noted anterolaterally with Lexiscan in the absence of chest pain.  Small, mild intensity, partially reversible basal anteroseptal defect that is suggestive of variable soft tissue attenuation, less likely ischemic zone but not completely excluded.  This is a low risk study.  Nuclear stress EF: 70%.      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  Starting systemic chemotherapy with weekly Cisplatin/Paclitaxel on 12/22/2015.  Complicated by illicit drug abuse.  Oncology history is updated.  Pre-treatment labs today: CBC diff, CMET, Magnesium.  I personally reviewed and went over laboratory results with the patient.  The results are noted within this dictation.   JAK2 testing is pending.  She notes a 3 day course of diarrhea that did result in night time awakening (Friday-Monday).  She notes that it has resolved.  We will give her anti-diarrheal instructions.  This could be Paclitaxel-induced.  Weight is very stable.  She notes pain and reports that her daughter has lost her most recent pain medication Rx.  I have searched the [facility] and there is not a recently filled pain medication Rx.  Therefore, I tend to believe this is true.  New Rx for Hydrocodone #15 is printed and provided to the patient.  Urine  drug screen is ordered as well.  Return as scheduled for IVF and follow-up with next treatment planned in 1 week.   ORDERS PLACED FOR THIS ENCOUNTER: Orders Placed This Encounter  Procedures  . Drug Screen, Urine  MEDICATIONS PRESCRIBED THIS ENCOUNTER: Meds ordered this encounter  Medications  . HYDROcodone-acetaminophen (NORCO) 5-325 MG tablet    Sig: Take 1-2 tablets by mouth every 6 (six) hours as needed for moderate pain or severe pain.    Dispense:  15 tablet    Refill:  0    Order Specific Question:   Supervising Provider    Answer:   Patrici Ranks [id number]    THERAPY PLAN:  Continue treatment with supportive care as outlined above.  All questions were answered. The patient knows to call the clinic with any problems, questions or concerns. We can certainly see the patient much sooner if necessary.  Patient and plan discussed with Dr. Ancil Linsey and she is in agreement with the aforementioned.   This note is electronically signed by: Doy Mince 01/05/2016 9:57 AM

## 2016-01-05 NOTE — Assessment & Plan Note (Addendum)
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  Starting systemic chemotherapy with weekly Cisplatin/Paclitaxel on 12/22/2015.  Complicated by illicit drug abuse.  Oncology history is updated.  Pre-treatment labs today: CBC diff, CMET, Magnesium.  I personally reviewed and went over laboratory results with the patient.  The results are noted within this dictation.   JAK2 testing is pending.  She notes a 3 day course of diarrhea that did result in night time awakening (Friday-Monday).  She notes that it has resolved.  We will give her anti-diarrheal instructions.  This could be Paclitaxel-induced.  Weight is very stable.  She notes pain and reports that her daughter has lost her most recent pain medication Rx.  I have searched the [facility] and there is not a recently filled pain medication Rx.  Therefore, I tend to believe this is true.  New Rx for Hydrocodone #15 is printed and provided to the patient.  Urine drug screen is ordered as well.  Return as scheduled for IVF and follow-up with next treatment planned in 1 week.

## 2016-01-07 ENCOUNTER — Encounter (HOSPITAL_COMMUNITY): Payer: Self-pay

## 2016-01-07 ENCOUNTER — Encounter (HOSPITAL_COMMUNITY)
Admission: RE | Admit: 2016-01-07 | Discharge: 2016-01-07 | Disposition: A | Discharge status: Home / Self Care | Payer: Medicaid Other | Source: Ambulatory Visit | Attending: Hematology & Oncology | Admitting: Hematology & Oncology

## 2016-01-07 DIAGNOSIS — C090 Malignant neoplasm of tonsillar fossa: Secondary | ICD-10-CM | POA: Diagnosis not present

## 2016-01-07 MED ORDER — SODIUM CHLORIDE 0.9 % IV SOLN
INTRAVENOUS | Status: DC
  Administered 2016-01-07: 09:00:00 via INTRAVENOUS

## 2016-01-07 MED ORDER — HEPARIN SOD (PORK) LOCK FLUSH 100 UNIT/ML IV SOLN
500.0000 [IU] | Freq: Once | INTRAVENOUS | Status: AC
  Administered 2016-01-07: 500 [IU] via INTRAVENOUS

## 2016-01-07 MED ORDER — HEPARIN SOD (PORK) LOCK FLUSH 100 UNIT/ML IV SOLN
INTRAVENOUS | Status: AC
  Filled 2016-01-07: qty 5

## 2016-01-11 ENCOUNTER — Ambulatory Visit (HOSPITAL_COMMUNITY): Payer: Self-pay

## 2016-01-11 ENCOUNTER — Ambulatory Visit (HOSPITAL_COMMUNITY): Payer: Self-pay | Admitting: Oncology

## 2016-01-11 ENCOUNTER — Encounter (HOSPITAL_COMMUNITY): Admission: RE | Admit: 2016-01-11 | Payer: Medicaid Other | Source: Ambulatory Visit

## 2016-01-11 ENCOUNTER — Other Ambulatory Visit: Payer: Self-pay | Admitting: *Deleted

## 2016-01-11 ENCOUNTER — Other Ambulatory Visit (HOSPITAL_COMMUNITY): Payer: Self-pay | Admitting: Obstetrics & Gynecology

## 2016-01-11 ENCOUNTER — Telehealth: Payer: Self-pay | Admitting: *Deleted

## 2016-01-11 DIAGNOSIS — C099 Malignant neoplasm of tonsil, unspecified: Secondary | ICD-10-CM

## 2016-01-11 NOTE — Telephone Encounter (Signed)
  Oncology Nurse Navigator Documentation  Molly Campbell's daughter Molly Campbell called with request for PEG evaluation.  She stated insertion site is purulent, "reddish".  She was unsure if redness present at insertion, did not think her mother was febrile.  I arranged appt with [facility] for Thursday, 3:00 arrival.  I communicated this to dtr, she voiced understanding.  Gayleen Orem, RN, BSN, [place] at [facility] [phone]   Navigator Location: [place] [phone]/05/17 1058) Navigator Encounter Type: Telephone (01/11/16 1058) Telephone: Incoming Call (01/11/16 1058)           Treatment Phase: Treatment (01/11/16 1058) Barriers/Navigation Needs: Coordination of Care (01/11/16 1058)   Interventions: Referrals;Coordination of Care (01/11/16 1058)                      Time Spent with Patient: 30 (01/11/16 1058)

## 2016-01-12 ENCOUNTER — Ambulatory Visit (HOSPITAL_COMMUNITY): Payer: Self-pay

## 2016-01-12 ENCOUNTER — Ambulatory Visit (HOSPITAL_COMMUNITY): Payer: Self-pay | Admitting: Hematology & Oncology

## 2016-01-12 MED ORDER — POTASSIUM CHLORIDE 2 MEQ/ML IV SOLN
Freq: Once | INTRAVENOUS | Status: DC
  Filled 2016-01-12: qty 10

## 2016-01-12 NOTE — Telephone Encounter (Signed)
83 Daughter called since pt has not shown for her chemo tx. Daughter will call her aunt to see if she was going to pick Molly Campbell up and bring her for her tx

## 2016-01-13 ENCOUNTER — Ambulatory Visit (HOSPITAL_COMMUNITY)
Admission: RE | Admit: 2016-01-13 | Discharge: 2016-01-13 | Disposition: A | Discharge status: Home / Self Care | Payer: Medicaid Other | Source: Ambulatory Visit | Attending: Hematology & Oncology | Admitting: Hematology & Oncology

## 2016-01-13 DIAGNOSIS — C099 Malignant neoplasm of tonsil, unspecified: Secondary | ICD-10-CM

## 2016-01-13 LAB — CALR + JAK2 E12-15 + MPL (REFLEXED)

## 2016-01-13 LAB — JAK2 V617F, W REFLEX TO CALR/E12/MPL

## 2016-01-14 ENCOUNTER — Telehealth (HOSPITAL_COMMUNITY): Payer: Self-pay | Admitting: *Deleted

## 2016-01-14 ENCOUNTER — Encounter (HOSPITAL_COMMUNITY): Payer: Medicaid Other

## 2016-01-14 NOTE — Telephone Encounter (Signed)
-----   Message from [facility], PA-C sent at 01/13/2016  6:00 PM EDT ----- Negative

## 2016-01-14 NOTE — Telephone Encounter (Signed)
Left message with daughter that recent labs were normal.

## 2016-01-17 ENCOUNTER — Encounter (HOSPITAL_COMMUNITY): Payer: Medicaid Other

## 2016-01-17 NOTE — Progress Notes (Signed)
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      10/27/2015 Pathology Results    invasive squamous cell carcinoma, p16 NEGATIVE      11/10/2015 PET scan    Bilateral palatine tonsil enlargement and hypermetabolism, with asymmetric hypermetabolism in the right palatine tonsil, consistent with primary malignancy in this location. Bilateral mildly enlarged hypermetabolic level 2 neck lymph nodes.      11/18/2015 Procedure    IR Gastrostomy Tube, fluoroscopic 20 French Pull through Gastrostomy      11/27/2015 Procedure    CT-guided core biopsy performed of the bilobed shaped soft tissue mass anterior to the proximal stomach.      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      12/07/2015 Imaging    NM Myocar multi w spect- Nuclear stress EF: 70%.      12/22/2015 -  Chemotherapy    Cisplatin/Paclitaxel weekly        HISTORY OF PRESENTING ILLNESS: Molly Campbell 55 y.o.  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 She presents for ongoing chemotherapy.   Molly Campbell is unaccompanied and presents in treatment bed. She is here for Cisplatin / Paclitaxel.  Admits to smoking about 9 to 12 cigarettes daily and drinking every other day.  She reports her throat feels better. "It has been feeling better for several weeks".  [facility] feeding tube looked [facility] and was told it was fine. She has been using her feeding tube. She notes that she is eating better as well. Weight is stable.  She reports vomiting not this past Sunday but the Sunday before (01/09/2016). This was the only episode of vomiting.  States her daughter will be around today.  She is requesting a medication for yeast infection, she notes vaginal itching. She is also requesting a refill on her pain medication.   MEDICAL HISTORY:  Past Medical History:  Diagnosis Date  . Alcohol abuse   . Alcoholic hepatitis [id number]  . Anemia   . Anxiety disorder   . Asthma   . Auditory hallucinations   . Cancer (HCC)    cervical  . Chronic back pain   . Chronic bronchitis with acute exacerbation ([place])   . Cocaine abuse   . Constipation   . COPD (chronic obstructive pulmonary disease) ([place])   . Depression   . Dysrhythmia 2012   hx AF in er-cocaine-converted back NSR  . Emphysema   . GERD (gastroesophageal  reflux disease)   . Headache   . History of cervical cancer 09/26/2013  . Hypertension   . Malignant neoplasm of tonsillar fossa ([place]) 11/12/2015  . Pneumonia    hx  . Poor circulation   . Schizophrenia ([place])   . Scoliosis   . Tobacco abuse     SURGICAL HISTORY: Past Surgical History:  Procedure Laterality Date  . ABDOMINAL HYSTERECTOMY    . BREAST BIOPSY  2005   lt bx  . COLONOSCOPY  05/07/2002   Dr.Demason- large approximately 1.5cm polyp pedunculated aproximately 10cm from the anal verge bx= adenomatous  polyp  . DIRECT LARYNGOSCOPY N/A 10/25/2015   Procedure: DIRECT LARYNGOSCOPY WITH BIOPSY OF ORAL PHARYNGEAL LESION;  Surgeon: Leta Baptist, MD;  Location: [facility];  Service: ENT;  Laterality: N/A;  DIRECT LARYNGOSCOPY WITH BIOPSY OF ORAL PHARYNGEAL LESION  . ESOPHAGOGASTRODUODENOSCOPY  06/02/2002   Dr. Anthony Sar- mild distal esophagitis w/o stricturing or ulceration. stomach and pylorus are normal. inflammatory changes of proximal duldenum in the first portion.   . ESOPHAGOGASTRODUODENOSCOPY (EGD) WITH PROPOFOL N/A 11/23/2015   Procedure: ESOPHAGOGASTRODUODENOSCOPY (EGD) WITH PROPOFOL;  Surgeon: Danie Binder, MD;  [facility];  Service: Endoscopy;  Laterality: N/A;  1245  . FRACTURE SURGERY  right wrist   [place]  . MULTIPLE EXTRACTIONS WITH ALVEOLOPLASTY N/A 12/13/2015   Procedure: Extraction of tooth #'s 2,3,4,5,6,12,13,14,15,17,and 18 with alveoloplasty, bilateral maxillary tuberosity reductions, and gross debridement of remaining teeth;  Surgeon: Lenn Cal, DDS;  Location: [facility];  Service: Oral Surgery;  Laterality: N/A;  . PEG PLACEMENT  11/18/15  . PORTACATH PLACEMENT Right 11/18/15    SOCIAL HISTORY: Social History   Social History  . Marital status: Widowed    Spouse name: N/A  . Number of children: 3  . Years of education: N/A   Occupational History  . Not on file.   Social History Main Topics  . Smoking status: Current Every Day Smoker    Packs/day: 1.00    Years: 48.00    Types: Cigarettes  . Smokeless tobacco: Former Systems developer    Types: Snuff     Comment: One pack a day  . Alcohol use 0.0 oz/week     Comment: 40 oz a day  . Drug use:     Types: Cocaine, Marijuana     Comment: last used on Friday 8/18  . Sexual activity: Not Currently    Birth control/ protection: Surgical   Other Topics Concern  . Not on file   Social History Narrative  . No narrative on file   Born here in [place]. Daughter, 2 daughters. Lost her  son. 10 grandchildren. No great-grandchildren yet. Used to work pulling tobacco. Is a smoker. Close to 2 ppd. Started smoking at the age of 14. She notes "I don't drink water." 2 40 oz bottles of beer a day. (?)  She says she does regular things for hobbies. Her daughter notes that she likes to go fishing.  FAMILY HISTORY: Family History  Problem Relation Age of Onset  . Alzheimer's disease Mother   . Other Father     "he was beat to death"  . Heart disease Sister   . Gout Sister   . COPD Sister   . Seizures Sister   . Diabetes Sister   . Diabetes Brother   . Cancer Paternal Grandmother     not sure what kind  . Seizures Sister   . Other Sister     "  bones are messed up"  . Hypertension Brother   . Heart attack Brother   . Other Brother     MVA  . Diabetes Brother   . Other Brother     MVA  . Alcohol abuse Daughter   . Other Son     motorcycle accident  . Heart failure Other    Mother & father deceased. Mother was 61 when she died. Had Alzheimer's. Father was younger. 4 brothers & 2 sisters living (and she's one of the sisters). There were 13 siblings total.  ALLERGIES:  is allergic to benicar [olmesartan]; ibuprofen; and latex.  MEDICATIONS:  Current Outpatient Prescriptions  Medication Sig Dispense Refill  . albuterol (PROVENTIL HFA;VENTOLIN HFA) 108 (90 BASE) MCG/ACT inhaler Inhale 1-2 puffs into the lungs every 6 (six) hours as needed for wheezing or shortness of breath. 1 Inhaler 0  . amLODipine (NORVASC) 5 MG tablet Take 5 mg by mouth daily.    Marland Kitchen CISPLATIN IV Inject into the vein.    Marland Kitchen dexamethasone (DECADRON) 4 MG tablet Take 2 tablets by mouth once a day on the day after chemotherapy and then take 2 tablets two times a day for 2 days. Take with food. 30 tablet 1  . HYDROcodone-acetaminophen (NORCO) 5-325 MG tablet Take 1-2 tablets by mouth every 6 (six) hours as needed for moderate pain or severe pain. 15 tablet 0  . lidocaine (XYLOCAINE) 2 % solution Mix 1  part 2%viscous lidocaine,1part H2O.Swish and swallow 35mL of this mixture,74min before meals and at bedtime, up to QID 100 mL 5  . lidocaine-prilocaine (EMLA) cream Apply to affected area once 30 g 3  . Multiple Vitamins-Minerals (MULTIVITAMIN) tablet Take 1 tablet by mouth daily. 180 tablet 1  . nicotine (NICODERM CQ - DOSED IN MG/24 HOURS) 14 mg/24hr patch Place 1 patch (14 mg total) onto the skin daily. Apply 21 mg patch daily x 6 wk, then 14mg  patch daily x 2 wk, then 7 mg patch daily x 2 wk 14 patch 0  . nicotine (NICODERM CQ - DOSED IN MG/24 HOURS) 21 mg/24hr patch Place 1 patch (21 mg total) onto the skin daily. Apply 21 mg patch daily x 6 wk, then 14mg  patch daily x 2 wk, then 7 mg patch daily x 2 wk 14 patch 2  . nicotine (NICODERM CQ - DOSED IN MG/24 HR) 7 mg/24hr patch Place 1 patch (7 mg total) onto the skin daily. Apply 21 mg patch daily x 6 wk, then 14mg  patch daily x 2 wk, then 7 mg patch daily x 2 wk 14 patch 0  . Nutritional Supplements (FEEDING SUPPLEMENT, OSMOLITE 1.5 CAL,) LIQD 5 cans via PEG, split into 3 meals of 1.5, 1.5 and 2 cans. Flush with 150 cc water before and after each feed (Patient taking differently: by PEG Tube route See admin instructions. 5 cans via PEG, split into 3 meals of 1.5, 1.5 and 2 cans. Flush with 150 cc water before and after each feed)  0  . omeprazole (PRILOSEC) 20 MG capsule 1 PO EVERY MORNING 31 capsule 11  . ondansetron (ZOFRAN) 8 MG tablet Take 1 tablet (8 mg total) by mouth 2 (two) times daily as needed. Start on the third day after chemotherapy. 30 tablet 1  . PACLitaxel (TAXOL IV) Inject into the vein. weekly    . polyethylene glycol powder (GLYCOLAX/MIRALAX) powder Take one capsule and mix in 4-6 ounces of water, take one to two times daily as needed for constipation. 527 g  3  . prochlorperazine (COMPAZINE) 10 MG tablet Take 1 tablet (10 mg total) by mouth every 6 (six) hours as needed (Nausea or vomiting). 30 tablet 1  . sodium fluoride  (FLUORISHIELD) 1.1 % GEL dental gel Instill one drop of gel per tooth space of fluoride tray. Place over teeth for 5 minutes. Remove. Spit out excess. Repeat nightly. 120 mL 11  . thiamine (VITAMIN B-1) 100 MG tablet Take 1 tablet (100 mg total) by mouth daily. 180 tablet 1   No current facility-administered medications for this visit.    Facility-Administered Medications Ordered in Other Visits  Medication Dose Route Frequency Provider Last Rate Last Dose  . 0.9 %  sodium chloride infusion   Intravenous Continuous Manon Hilding Kefalas, PA-C      . 0.9 %  sodium chloride infusion   Intravenous Continuous Manon Hilding Kefalas, PA-C      . 0.9 %  sodium chloride infusion   Intravenous Continuous Manon Hilding Kefalas, PA-C      . 0.9 %  sodium chloride infusion   Intravenous Continuous Manon Hilding Kefalas, PA-C      . 0.9 %  sodium chloride infusion   Intravenous Continuous Manon Hilding Kefalas, PA-C      . 0.9 %  sodium chloride infusion   Intravenous Continuous Manon Hilding Kefalas, PA-C      . 0.9 %  sodium chloride infusion   Intravenous Continuous Baird Cancer, PA-C        Review of Systems  Constitutional: Negative.   HENT: Positive for sore throat (improved).   Eyes: Negative.   Respiratory: Negative.   Cardiovascular: Negative.   Gastrointestinal: Positive for vomiting.       One episode of vomiting on 01/09/2016  Genitourinary: Negative.   Musculoskeletal: Positive for back pain.  Skin: Negative.   Neurological: Negative.   Endo/Heme/Allergies: Negative.   Psychiatric/Behavioral: Positive for depression and substance abuse.  All other systems reviewed and are negative. 14 point ROS was done and is otherwise as detailed above or in HPI   PHYSICAL EXAMINATION: ECOG PERFORMANCE STATUS: 1 - Symptomatic but completely ambulatory  Vitals:   01/19/16 1005  BP: 135/80  Pulse: (!) 102  Resp: 16  Temp: 98.5 F (36.9 C)   Filed Weights   01/19/16 1005  Weight: 111 lb (50.3 kg)    Physical  Exam  Constitutional: She is oriented to person, place, and time and well-developed, well-nourished, and in no distress.  In treatment bed  HENT:  Head: Normocephalic and atraumatic.  Mouth/Throat: Oropharynx is clear and moist.  Glossal palatine arch with tumor related changes, improved  Eyes: Conjunctivae and EOM are normal. Pupils are equal, round, and reactive to light. Right eye exhibits no discharge. Left eye exhibits no discharge. No scleral icterus.  Neck: Normal range of motion. Neck supple. No JVD present. No tracheal deviation present. No thyromegaly present.  Cardiovascular: Normal rate, regular rhythm and normal heart sounds.   Pulmonary/Chest: Effort normal and breath sounds normal. No stridor. No respiratory distress. She has no wheezes. She has no rales. She exhibits no tenderness.  Abdominal: Soft. Bowel sounds are normal. She exhibits no distension. There is no tenderness. There is no rebound.  Gastrostomy tube    Musculoskeletal: Normal range of motion. She exhibits no edema or deformity.  Lymphadenopathy:    She has no cervical adenopathy.  Neurological: She is alert and oriented to person, place, and time. No cranial nerve deficit. Gait normal.  Skin: Skin  is warm and dry. No rash noted. No erythema. No pallor.  Nursing note and vitals reviewed.   LABORATORY DATA:  I have reviewed the data as listed Lab Results  Component Value Date   WBC 4.5 01/19/2016   HGB 11.0 (L) 01/19/2016   HCT 31.8 (L) 01/19/2016   MCV 95.5 01/19/2016   PLT 358 01/19/2016   CMP     Component Value Date/Time   NA 136 01/19/2016 1021   K 3.4 (L) 01/19/2016 1021   CL 104 01/19/2016 1021   CO2 22 01/19/2016 1021   GLUCOSE 120 (H) 01/19/2016 1021   BUN 5 (L) 01/19/2016 1021   CREATININE 0.56 01/19/2016 1021   CALCIUM 8.9 01/19/2016 1021   PROT 7.0 01/19/2016 1021   ALBUMIN 3.8 01/19/2016 1021   AST 19 01/19/2016 1021   ALT 14 01/19/2016 1021   ALKPHOS 100 01/19/2016 1021    BILITOT 0.2 (L) 01/19/2016 1021   GFRNONAA >60 01/19/2016 1021   GFRAA >60 01/19/2016 1021    RADIOGRAPHIC STUDIES: I have personally reviewed the radiological images as listed and agreed with the findings in the report.  CLINICAL DATA:  Initial treatment strategy for right tonsillar squamous cell carcinoma diagnosed 10/25/2015.  EXAM: NUCLEAR MEDICINE PET SKULL BASE TO THIGH  TECHNIQUE: 5.7 mCi F-18 FDG was injected intravenously. Full-ring PET imaging was performed from the skull base to thigh after the radiotracer. CT data was obtained and used for attenuation correction and anatomic localization.  FASTING BLOOD GLUCOSE:  Value: 110 mg/dl  COMPARISON:  10/05/2015 neck CT.  FINDINGS: NECK  There is symmetric mild enlargement of the palatine tonsils bilaterally. There is asymmetric hypermetabolism in the right palatine tonsil with max SUV 9.1. There is hypermetabolism in the left palatine tonsil with max SUV 7.6.  Mildly enlarged hypermetabolic 1.0 cm right level 2 lymph node near the angle of the mandible with max SUV 5.0 (series 4/image 21).  Mildly enlarged hypermetabolic 1.0 cm left level 2 lymph node near the angle of the mandible with max SUV 4.4 (series 4/image 23).  No additional hypermetabolic lymph nodes in the neck.  CHEST  There is a 0.7 cm left upper lobe pulmonary nodule (series 6/image 23) with associated mild metabolism with max SUV 1.9 (which is considered significant for a nodule of this small size). At least 3 additional smaller pulmonary nodules in the right upper lobe and left lower lobe are below PET resolution. No acute consolidative airspace disease.  No hypermetabolic axillary, mediastinal or hilar nodes. Left anterior descending, left circumflex and right coronary atherosclerosis. Atherosclerotic nonaneurysmal thoracic aorta. Mild centrilobular and paraseptal emphysema with mild diffuse bronchial wall  thickening.  ABDOMEN/PELVIS  There is a lobulated solid homogeneous 4.9 x 2.5 cm hypermetabolic exophytic anterior proximal gastric mass with max SUV 24.8 (series 4/image 99). Stomach is collapsed and otherwise grossly normal.  No abnormal hypermetabolic activity within the liver, pancreas, adrenal glands, or spleen. No hypermetabolic lymph nodes in the abdomen or pelvis. Atherosclerotic nonaneurysmal abdominal aorta.  SKELETON  No focal hypermetabolic activity to suggest skeletal metastasis.  IMPRESSION: 1. Bilateral palatine tonsil enlargement and hypermetabolism, with asymmetric hypermetabolism in the right palatine tonsil, consistent with primary malignancy in this location. Malignant involvement of the contralateral (left) palatine tonsil cannot be excluded. 2. Bilateral mildly enlarged hypermetabolic level 2 neck lymph nodes, suspicious for nodal metastases. 3. Four subcentimeter pulmonary nodules scattered in both lungs, the largest of which demonstrates mild metabolism (max SUV 1.9), which is considered significant for  a nodule of this small size, and raises concern for pulmonary metastases. 4. Solid lobulated hypermetabolic 4.9 x 2.5 cm gastric mass arising exophytically from the anterior proximal stomach, most suggestive of a gastric GIST. Tissue sampling is advised for this mass, probably best obtained via upper endoscopic approach. 5. Three-vessel coronary atherosclerosis.  Aortic atherosclerosis. 6. Mild centrilobular and paraseptal emphysema with mild diffuse bronchial wall thickening, suggesting COPD.   Electronically Signed   By: Ilona Sorrel M.D.   On: 11/10/2015 15:10  PATHOLOGY   ASSESSMENT & PLAN: Malignant neoplasm of tonsillar fossa (HCC)   Staging form: Pharynx - Oropharynx, AJCC 7th Edition   - Clinical: Stage IVA (T3, N2c, M0) - Signed by Eppie Gibson, MD on 11/12/2015 Cervical cancer in 1985, treated surgically with a partial  hysterectomy. EtOH abuse, still drinking, two 40 oz beer/day Tobacco abuse, still smoking 2 ppd Cocaine abuse  Alcoholic hepatitis Mental retardation GERD  Gastric GIST  Overall she tolerates chemotherapy well. Unfortunately continues to smoke and drink alcohol. We address discontinuation at each visit. Her pain has improved. Weight is stable.  I have given her a prescription for diflucan. She was advised that if her vaginal symptoms do not resolve to let me know, I will get her to gynecology.   Per Dr. Lanell Persons visit:     She is scheduled for follow up on 01/26/2016 with her next cycle of treatment. Will consider re-imaging in the next few cycles. Plan for GIST after head and neck cancer treated.   All questions were answered. The patient knows to call the clinic with any problems, questions or concerns.  This document serves as a record of services personally performed by Ancil Linsey, MD. It was created on her behalf by Arlyce Harman, a trained medical scribe. The creation of this record is based on the scribe's personal observations and the provider's statements to them. This document has been checked and approved by the attending provider.  I have reviewed the above documentation for accuracy and completeness and I agree with the above.  This note was electronically signed.    Molli Hazard, MD  01/19/2016 11:01 AM

## 2016-01-18 ENCOUNTER — Ambulatory Visit (HOSPITAL_COMMUNITY): Payer: Self-pay

## 2016-01-18 ENCOUNTER — Ambulatory Visit (HOSPITAL_COMMUNITY): Payer: Self-pay | Admitting: Oncology

## 2016-01-19 ENCOUNTER — Ambulatory Visit (HOSPITAL_COMMUNITY): Payer: Self-pay

## 2016-01-19 ENCOUNTER — Encounter (HOSPITAL_COMMUNITY): Payer: Medicaid Other | Attending: Oncology | Admitting: Hematology & Oncology

## 2016-01-19 ENCOUNTER — Other Ambulatory Visit (HOSPITAL_COMMUNITY): Payer: Self-pay | Admitting: Oncology

## 2016-01-19 ENCOUNTER — Encounter (HOSPITAL_COMMUNITY): Payer: Medicaid Other

## 2016-01-19 ENCOUNTER — Encounter (HOSPITAL_COMMUNITY): Payer: Self-pay | Admitting: Hematology & Oncology

## 2016-01-19 VITALS — BP 129/84 | HR 77 | Temp 98.7°F | Resp 18

## 2016-01-19 VITALS — BP 135/80 | HR 102 | Temp 98.5°F | Resp 16 | Wt 111.0 lb

## 2016-01-19 DIAGNOSIS — Z23 Encounter for immunization: Secondary | ICD-10-CM

## 2016-01-19 DIAGNOSIS — Z720 Tobacco use: Secondary | ICD-10-CM

## 2016-01-19 DIAGNOSIS — F1410 Cocaine abuse, uncomplicated: Secondary | ICD-10-CM | POA: Insufficient documentation

## 2016-01-19 DIAGNOSIS — Z789 Other specified health status: Secondary | ICD-10-CM

## 2016-01-19 DIAGNOSIS — C090 Malignant neoplasm of tonsillar fossa: Secondary | ICD-10-CM

## 2016-01-19 DIAGNOSIS — Z7289 Other problems related to lifestyle: Secondary | ICD-10-CM

## 2016-01-19 DIAGNOSIS — E876 Hypokalemia: Secondary | ICD-10-CM | POA: Insufficient documentation

## 2016-01-19 DIAGNOSIS — Z5111 Encounter for antineoplastic chemotherapy: Secondary | ICD-10-CM | POA: Diagnosis present

## 2016-01-19 DIAGNOSIS — C49A2 Gastrointestinal stromal tumor of stomach: Secondary | ICD-10-CM

## 2016-01-19 DIAGNOSIS — D473 Essential (hemorrhagic) thrombocythemia: Secondary | ICD-10-CM | POA: Diagnosis present

## 2016-01-19 DIAGNOSIS — Z931 Gastrostomy status: Secondary | ICD-10-CM

## 2016-01-19 LAB — CBC WITH DIFFERENTIAL/PLATELET
BASOS ABS: 0 10*3/uL (ref 0.0–0.1)
BASOS PCT: 0 %
Eosinophils Absolute: 0 10*3/uL (ref 0.0–0.7)
Eosinophils Relative: 0 %
HEMATOCRIT: 31.8 % — AB (ref 36.0–46.0)
Hemoglobin: 11 g/dL — ABNORMAL LOW (ref 12.0–15.0)
Lymphocytes Relative: 48 %
Lymphs Abs: 2.1 10*3/uL (ref 0.7–4.0)
MCH: 33 pg (ref 26.0–34.0)
MCHC: 34.6 g/dL (ref 30.0–36.0)
MCV: 95.5 fL (ref 78.0–100.0)
MONO ABS: 0.8 10*3/uL (ref 0.1–1.0)
Monocytes Relative: 18 %
NEUTROS ABS: 1.5 10*3/uL — AB (ref 1.7–7.7)
NEUTROS PCT: 34 %
Platelets: 358 10*3/uL (ref 150–400)
RBC: 3.33 MIL/uL — AB (ref 3.87–5.11)
RDW: 14.5 % (ref 11.5–15.5)
WBC: 4.5 10*3/uL (ref 4.0–10.5)

## 2016-01-19 LAB — COMPREHENSIVE METABOLIC PANEL
ALT: 14 U/L (ref 14–54)
AST: 19 U/L (ref 15–41)
Albumin: 3.8 g/dL (ref 3.5–5.0)
Alkaline Phosphatase: 100 U/L (ref 38–126)
Anion gap: 10 (ref 5–15)
BILIRUBIN TOTAL: 0.2 mg/dL — AB (ref 0.3–1.2)
BUN: 5 mg/dL — AB (ref 6–20)
CHLORIDE: 104 mmol/L (ref 101–111)
CO2: 22 mmol/L (ref 22–32)
Calcium: 8.9 mg/dL (ref 8.9–10.3)
Creatinine, Ser: 0.56 mg/dL (ref 0.44–1.00)
GFR calc Af Amer: >60 mL/min (ref >60)
GFR calc non Af Amer: >60 mL/min (ref >60)
GLUCOSE: 120 mg/dL — AB (ref 65–99)
POTASSIUM: 3.4 mmol/L — AB (ref 3.5–5.1)
Sodium: 136 mmol/L (ref 135–145)
Total Protein: 7 g/dL (ref 6.5–8.1)

## 2016-01-19 LAB — MAGNESIUM: MAGNESIUM: 1.6 mg/dL — AB (ref 1.7–2.4)

## 2016-01-19 MED ORDER — INFLUENZA VAC SPLIT QUAD 0.5 ML IM SUSY
0.5000 mL | PREFILLED_SYRINGE | Freq: Once | INTRAMUSCULAR | Status: AC
  Administered 2016-01-19: 0.5 mL via INTRAMUSCULAR
  Filled 2016-01-19: qty 0.5

## 2016-01-19 MED ORDER — POTASSIUM CHLORIDE CRYS ER 20 MEQ PO TBCR
20.0000 meq | EXTENDED_RELEASE_TABLET | Freq: Once | ORAL | Status: AC
  Administered 2016-01-19: 20 meq via ORAL
  Filled 2016-01-19: qty 1

## 2016-01-19 MED ORDER — HEPARIN SOD (PORK) LOCK FLUSH 100 UNIT/ML IV SOLN
500.0000 [IU] | Freq: Once | INTRAVENOUS | Status: AC | PRN
  Administered 2016-01-19: 500 [IU]
  Filled 2016-01-19: qty 5

## 2016-01-19 MED ORDER — SODIUM CHLORIDE 0.9% FLUSH
10.0000 mL | INTRAVENOUS | Status: DC | PRN
  Administered 2016-01-19: 10 mL
  Filled 2016-01-19: qty 10

## 2016-01-19 MED ORDER — FUROSEMIDE 10 MG/ML IJ SOLN
20.0000 mg | Freq: Once | INTRAMUSCULAR | Status: AC
  Administered 2016-01-19: 20 mg via INTRAVENOUS
  Filled 2016-01-19: qty 2

## 2016-01-19 MED ORDER — SODIUM CHLORIDE 0.9 % IV SOLN
Freq: Once | INTRAVENOUS | Status: AC
  Administered 2016-01-19: 11:00:00 via INTRAVENOUS

## 2016-01-19 MED ORDER — POTASSIUM CHLORIDE 2 MEQ/ML IV SOLN
Freq: Once | INTRAVENOUS | Status: AC
  Administered 2016-01-19: 11:00:00 via INTRAVENOUS
  Filled 2016-01-19: qty 10

## 2016-01-19 MED ORDER — PALONOSETRON HCL INJECTION 0.25 MG/5ML
0.2500 mg | Freq: Once | INTRAVENOUS | Status: AC
  Administered 2016-01-19: 0.25 mg via INTRAVENOUS
  Filled 2016-01-19: qty 5

## 2016-01-19 MED ORDER — SODIUM CHLORIDE 0.9 % IV SOLN
Freq: Once | INTRAVENOUS | Status: AC
  Administered 2016-01-19: 13:00:00 via INTRAVENOUS
  Filled 2016-01-19: qty 5

## 2016-01-19 MED ORDER — DIPHENHYDRAMINE HCL 50 MG/ML IJ SOLN
50.0000 mg | Freq: Once | INTRAMUSCULAR | Status: AC
  Administered 2016-01-19: 50 mg via INTRAVENOUS
  Filled 2016-01-19: qty 1

## 2016-01-19 MED ORDER — PACLITAXEL CHEMO INJECTION 300 MG/50ML
80.0000 mg/m2 | Freq: Once | INTRAVENOUS | Status: AC
  Administered 2016-01-19: 120 mg via INTRAVENOUS
  Filled 2016-01-19: qty 20

## 2016-01-19 MED ORDER — SODIUM CHLORIDE 0.9 % IV SOLN
40.0000 mg/m2 | Freq: Once | INTRAVENOUS | Status: AC
  Administered 2016-01-19: 59 mg via INTRAVENOUS
  Filled 2016-01-19: qty 59

## 2016-01-19 MED ORDER — ACETAMINOPHEN 325 MG PO TABS
650.0000 mg | ORAL_TABLET | Freq: Once | ORAL | Status: AC
  Administered 2016-01-19: 650 mg via ORAL
  Filled 2016-01-19: qty 2

## 2016-01-19 MED ORDER — FAMOTIDINE IN NACL 20-0.9 MG/50ML-% IV SOLN
20.0000 mg | Freq: Once | INTRAVENOUS | Status: AC
  Administered 2016-01-19: 20 mg via INTRAVENOUS
  Filled 2016-01-19: qty 50

## 2016-01-19 NOTE — Patient Instructions (Signed)
[facility] Discharge Instructions for Patients Receiving Chemotherapy   Beginning January 23rd 2017 lab work for the [facility] will be done in the  Main lab at [facility] on 1st floor. If you have a lab appointment with the [facility] please come in thru the  Main Entrance and check in at the main information desk   Today you received the following chemotherapy agents Taxol and Cisplatin. Follow-up as scheduled. Call clinic for any questions or concerns  To help prevent nausea and vomiting after your treatment, we encourage you to take your nausea medication   If you develop nausea and vomiting, or diarrhea that is not controlled by your medication, call the clinic.  The clinic phone number is [phone]. Office hours are Monday-Friday 8:30am-5:00pm.  BELOW ARE SYMPTOMS THAT SHOULD BE REPORTED IMMEDIATELY:  *FEVER GREATER THAN 101.0 F  *CHILLS WITH OR WITHOUT FEVER  NAUSEA AND VOMITING THAT IS NOT CONTROLLED WITH YOUR NAUSEA MEDICATION  *UNUSUAL SHORTNESS OF BREATH  *UNUSUAL BRUISING OR BLEEDING  TENDERNESS IN MOUTH AND THROAT WITH OR WITHOUT PRESENCE OF ULCERS  *URINARY PROBLEMS  *BOWEL PROBLEMS  UNUSUAL RASH Items with * indicate a potential emergency and should be followed up as soon as possible. If you have an emergency after office hours please contact your primary care physician or go to the nearest emergency department.  Please call the clinic during office hours if you have any questions or concerns.   You may also contact the Patient Navigator at [phone] should you have any questions or need assistance in obtaining follow up care.      Resources For Cancer Patients and their Caregivers ? American Cancer Society: Can assist with transportation, wigs, general needs, runs Look Good Feel Better.        609-702-6962 ? Cancer Care: Provides financial assistance, online support groups, medication/co-pay assistance.   1-800-813-HOPE 3151716474) ? [place] Assists [place] cancer patients and their families through emotional , educational and financial support.  469-293-4291 ? [place] DSS Where to apply for food stamps, Medicaid and utility assistance. 304-813-8973 ? [facility]: Transportation to medical appointments. 815-128-2860 ? Social Security Administration: May apply for disability if have a Stage IV cancer. 787-762-8801 (470)737-7920 ? [employer], Disability and Transit Services: Assists with nutrition, care and transit needs. 765-735-5454

## 2016-01-19 NOTE — Progress Notes (Signed)
1125 Pt voided 300 ml clear yellow urine without difficulty  1645 Pt tolerated chemo tx and Influenza vaccine  well without incident. Labs shown to MD during visit and pt approved for chemo prior to administration. VSS upon discharge and pt voided over 2000 ml while in clinic. Pt discharged self ambulatory in satisfactory condition with family

## 2016-01-19 NOTE — Patient Instructions (Signed)
[facility] at [facility] Discharge Instructions  RECOMMENDATIONS MADE BY THE CONSULTANT AND ANY TEST RESULTS WILL BE SENT TO YOUR REFERRING PHYSICIAN.  You saw Dr. Penland today.  Thank you for choosing [facility] at [facility] to provide your oncology and hematology care.  To afford each patient quality time with our provider, please arrive at least 15 minutes before your scheduled appointment time.   Beginning January 23rd 2017 lab work for the Cancer Center will be done in the  Main lab at [facility] on 1st floor. If you have a lab appointment with the Cancer Center please come in thru the  Main Entrance and check in at the main information desk  You need to re-schedule your appointment should you arrive 10 or more minutes late.  We strive to give you quality time with our providers, and arriving late affects you and other patients whose appointments are after yours.  Also, if you no show three or more times for appointments you may be dismissed from the clinic at the providers discretion.     Again, thank you for choosing [facility].  Our hope is that these requests will decrease the amount of time that you wait before being seen by our physicians.       _____________________________________________________________  Should you have questions after your visit to  Cancer Center, please contact our office at [phone] between the hours of 8:30 a.m. and 4:30 p.m.  Voicemails left after 4:30 p.m. will not be returned until the following business day.  For prescription refill requests, have your pharmacy contact our office.         Resources For Cancer Patients and their Caregivers ? American Cancer Society: Can assist with transportation, wigs, general needs, runs Look Good Feel Better.        1-888-227-6333 ? Cancer Care: Provides financial assistance, online support groups, medication/co-pay assistance.   1-800-813-HOPE (4673) ? [facility] Assists [place] cancer patients and their families through emotional , educational and financial support.  336-427-4357 ? [place] DSS Where to apply for food stamps, Medicaid and utility assistance. 336-342-1394 ? RCATS: Transportation to medical appointments. 336-347-2287 ? Social Security Administration: May apply for disability if have a Stage IV cancer. 336-342-7796 1-800-772-1213 ? [place] Aging, Disability and Transit Services: Assists with nutrition, care and transit needs. 336-349-2343  Cancer Center Support Programs: [email or web address]@ > Cancer Support Group  2nd Tuesday of the month 1pm-2pm, Journey Room  > Creative Journey  3rd Tuesday of the month 1130am-1pm, Journey Room  > Look Good Feel Better  1st Wednesday of the month 10am-12 noon, Journey Room (Call American Cancer Society to register 1-800-395-5775)    

## 2016-01-21 ENCOUNTER — Encounter (HOSPITAL_COMMUNITY)
Admission: RE | Admit: 2016-01-21 | Discharge: 2016-01-21 | Disposition: A | Discharge status: Home / Self Care | Payer: Medicaid Other | Source: Ambulatory Visit | Attending: Hematology & Oncology | Admitting: Hematology & Oncology

## 2016-01-21 DIAGNOSIS — C090 Malignant neoplasm of tonsillar fossa: Secondary | ICD-10-CM | POA: Diagnosis not present

## 2016-01-21 MED ORDER — HEPARIN SOD (PORK) LOCK FLUSH 100 UNIT/ML IV SOLN
INTRAVENOUS | Status: AC
  Filled 2016-01-21: qty 5

## 2016-01-21 MED ORDER — SODIUM CHLORIDE 0.9 % IV SOLN
INTRAVENOUS | Status: DC
  Administered 2016-01-21: 1000 mL via INTRAVENOUS

## 2016-01-21 MED ORDER — HYDROCODONE-ACETAMINOPHEN 5-325 MG PO TABS: 1.0000 | ORAL_TABLET | ORAL | 0 refills | Status: DC | PRN

## 2016-01-21 MED ORDER — HEPARIN SOD (PORK) LOCK FLUSH 100 UNIT/ML IV SOLN
500.0000 [IU] | INTRAVENOUS | Status: AC | PRN
  Administered 2016-01-21: 500 [IU]

## 2016-01-24 ENCOUNTER — Encounter (HOSPITAL_COMMUNITY)
Admission: RE | Admit: 2016-01-24 | Discharge: 2016-01-24 | Disposition: A | Discharge status: Home / Self Care | Payer: Medicaid Other | Source: Ambulatory Visit | Attending: Hematology & Oncology | Admitting: Hematology & Oncology

## 2016-01-24 DIAGNOSIS — C090 Malignant neoplasm of tonsillar fossa: Secondary | ICD-10-CM | POA: Diagnosis not present

## 2016-01-24 MED ORDER — HEPARIN SOD (PORK) LOCK FLUSH 100 UNIT/ML IV SOLN
INTRAVENOUS | Status: AC
  Filled 2016-01-24: qty 5

## 2016-01-24 MED ORDER — HEPARIN SOD (PORK) LOCK FLUSH 100 UNIT/ML IV SOLN
500.0000 [IU] | INTRAVENOUS | Status: AC | PRN
  Administered 2016-01-24: 500 [IU]

## 2016-01-24 MED ORDER — SODIUM CHLORIDE 0.9 % IV SOLN
INTRAVENOUS | Status: DC
  Administered 2016-01-24: 1000 mL via INTRAVENOUS

## 2016-01-25 ENCOUNTER — Ambulatory Visit (HOSPITAL_COMMUNITY): Payer: Self-pay | Admitting: Hematology & Oncology

## 2016-01-25 ENCOUNTER — Ambulatory Visit (HOSPITAL_COMMUNITY): Payer: Self-pay

## 2016-01-26 ENCOUNTER — Encounter (HOSPITAL_BASED_OUTPATIENT_CLINIC_OR_DEPARTMENT_OTHER): Payer: Medicaid Other

## 2016-01-26 ENCOUNTER — Encounter (HOSPITAL_BASED_OUTPATIENT_CLINIC_OR_DEPARTMENT_OTHER): Payer: Medicaid Other | Admitting: Oncology

## 2016-01-26 ENCOUNTER — Encounter (HOSPITAL_COMMUNITY): Payer: Self-pay

## 2016-01-26 VITALS — BP 154/72 | HR 88 | Temp 98.6°F | Resp 18 | Wt 105.4 lb

## 2016-01-26 DIAGNOSIS — F1410 Cocaine abuse, uncomplicated: Secondary | ICD-10-CM | POA: Diagnosis not present

## 2016-01-26 DIAGNOSIS — C090 Malignant neoplasm of tonsillar fossa: Secondary | ICD-10-CM | POA: Diagnosis not present

## 2016-01-26 DIAGNOSIS — Z5111 Encounter for antineoplastic chemotherapy: Secondary | ICD-10-CM | POA: Diagnosis present

## 2016-01-26 LAB — COMPREHENSIVE METABOLIC PANEL
ALBUMIN: 3.9 g/dL (ref 3.5–5.0)
ALK PHOS: 81 U/L (ref 38–126)
ALT: 14 U/L (ref 14–54)
AST: 22 U/L (ref 15–41)
Anion gap: 7 (ref 5–15)
BUN: 7 mg/dL (ref 6–20)
CALCIUM: 8.5 mg/dL — AB (ref 8.9–10.3)
CO2: 24 mmol/L (ref 22–32)
CREATININE: 0.57 mg/dL (ref 0.44–1.00)
Chloride: 106 mmol/L (ref 101–111)
GFR calc Af Amer: >60 mL/min (ref >60)
GFR calc non Af Amer: >60 mL/min (ref >60)
GLUCOSE: 114 mg/dL — AB (ref 65–99)
Potassium: 3.6 mmol/L (ref 3.5–5.1)
SODIUM: 137 mmol/L (ref 135–145)
Total Bilirubin: 0.6 mg/dL (ref 0.3–1.2)
Total Protein: 6.9 g/dL (ref 6.5–8.1)

## 2016-01-26 LAB — CBC WITH DIFFERENTIAL/PLATELET
BASOS ABS: 0.1 10*3/uL (ref 0.0–0.1)
Basophils Relative: 1 %
EOS ABS: 0.1 10*3/uL (ref 0.0–0.7)
Eosinophils Relative: 1 %
HEMATOCRIT: 30.7 % — AB (ref 36.0–46.0)
HEMOGLOBIN: 10.7 g/dL — AB (ref 12.0–15.0)
LYMPHS PCT: 71 %
Lymphs Abs: 3.6 10*3/uL (ref 0.7–4.0)
MCH: 33 pg (ref 26.0–34.0)
MCHC: 34.9 g/dL (ref 30.0–36.0)
MCV: 94.8 fL (ref 78.0–100.0)
MONO ABS: 0.4 10*3/uL (ref 0.1–1.0)
Monocytes Relative: 7 %
NEUTROS PCT: 20 %
Neutro Abs: 1 10*3/uL — ABNORMAL LOW (ref 1.7–7.7)
Platelets: 363 10*3/uL (ref 150–400)
RBC: 3.24 MIL/uL — ABNORMAL LOW (ref 3.87–5.11)
RDW: 14.4 % (ref 11.5–15.5)
WBC: 5.2 10*3/uL (ref 4.0–10.5)

## 2016-01-26 LAB — MAGNESIUM: Magnesium: 1.4 mg/dL — ABNORMAL LOW (ref 1.7–2.4)

## 2016-01-26 MED ORDER — FOSAPREPITANT DIMEGLUMINE INJECTION 150 MG
Freq: Once | INTRAVENOUS | Status: AC
  Administered 2016-01-26: 12:00:00 via INTRAVENOUS
  Filled 2016-01-26: qty 5

## 2016-01-26 MED ORDER — SODIUM CHLORIDE 0.9 % IV SOLN
Freq: Once | INTRAVENOUS | Status: AC
  Administered 2016-01-26: 11:00:00 via INTRAVENOUS

## 2016-01-26 MED ORDER — ALTEPLASE 2 MG IJ SOLR: 2.0000 mg | Freq: Once | INTRAMUSCULAR | Status: DC | PRN

## 2016-01-26 MED ORDER — SODIUM CHLORIDE 0.9 % IV SOLN
40.0000 mg/m2 | Freq: Once | INTRAVENOUS | Status: AC
  Administered 2016-01-26: 59 mg via INTRAVENOUS
  Filled 2016-01-26: qty 59

## 2016-01-26 MED ORDER — SODIUM CHLORIDE 0.9% FLUSH
10.0000 mL | INTRAVENOUS | Status: DC | PRN
  Administered 2016-01-26: 10 mL
  Filled 2016-01-26: qty 10

## 2016-01-26 MED ORDER — SODIUM CHLORIDE 0.9% FLUSH: 3.0000 mL | INTRAVENOUS | Status: DC | PRN

## 2016-01-26 MED ORDER — ACETAMINOPHEN 325 MG PO TABS
650.0000 mg | ORAL_TABLET | Freq: Once | ORAL | Status: AC
  Administered 2016-01-26: 650 mg via ORAL
  Filled 2016-01-26: qty 2

## 2016-01-26 MED ORDER — FAMOTIDINE IN NACL 20-0.9 MG/50ML-% IV SOLN
20.0000 mg | Freq: Once | INTRAVENOUS | Status: AC
  Administered 2016-01-26: 20 mg via INTRAVENOUS
  Filled 2016-01-26: qty 50

## 2016-01-26 MED ORDER — HEPARIN SOD (PORK) LOCK FLUSH 100 UNIT/ML IV SOLN: 250.0000 [IU] | Freq: Once | INTRAVENOUS | Status: DC | PRN

## 2016-01-26 MED ORDER — HEPARIN SOD (PORK) LOCK FLUSH 100 UNIT/ML IV SOLN
500.0000 [IU] | Freq: Once | INTRAVENOUS | Status: AC | PRN
  Administered 2016-01-26: 500 [IU]
  Filled 2016-01-26: qty 5

## 2016-01-26 MED ORDER — POTASSIUM CHLORIDE 2 MEQ/ML IV SOLN
Freq: Once | INTRAVENOUS | Status: AC
  Administered 2016-01-26: 09:00:00 via INTRAVENOUS
  Filled 2016-01-26: qty 10

## 2016-01-26 MED ORDER — PALONOSETRON HCL INJECTION 0.25 MG/5ML
0.2500 mg | Freq: Once | INTRAVENOUS | Status: AC
  Administered 2016-01-26: 0.25 mg via INTRAVENOUS
  Filled 2016-01-26: qty 5

## 2016-01-26 MED ORDER — DIPHENHYDRAMINE HCL 50 MG/ML IJ SOLN
50.0000 mg | Freq: Once | INTRAMUSCULAR | Status: AC
  Administered 2016-01-26: 50 mg via INTRAVENOUS
  Filled 2016-01-26: qty 1

## 2016-01-26 MED ORDER — DEXTROSE 5 % IV SOLN
80.0000 mg/m2 | Freq: Once | INTRAVENOUS | Status: AC
  Administered 2016-01-26: 120 mg via INTRAVENOUS
  Filled 2016-01-26: qty 20

## 2016-01-26 MED ORDER — SODIUM CHLORIDE 0.9 % IV SOLN
2.0000 g | Freq: Once | INTRAVENOUS | Status: AC
  Administered 2016-01-26: 2 g via INTRAVENOUS
  Filled 2016-01-26: qty 4

## 2016-01-26 MED ORDER — FUROSEMIDE 10 MG/ML IJ SOLN
20.0000 mg | Freq: Once | INTRAMUSCULAR | Status: AC
  Administered 2016-01-26: 20 mg via INTRAVENOUS
  Filled 2016-01-26: qty 2

## 2016-01-26 NOTE — Progress Notes (Signed)
Pt voided 300 ml clear, yellow urine.

## 2016-01-26 NOTE — Progress Notes (Signed)
Labs reviewed with DR. Penland. ANC 1.0 Will proceed with therapy.  Patient had chemotherapy today. Patient tolerated well. No problems, Vitals stable. Discharged with family ambulatory from clinic.

## 2016-01-26 NOTE — Progress Notes (Signed)
[facility], MD [place] [address]  Malignant neoplasm of tonsillar fossa [facility]) - Plan: NM PET Image Restag (PS) Skull Base To Thigh, CT SOFT TISSUE NECK W CONTRAST  CURRENT THERAPY: Weekly Cisplatin/Paclitaxel beginning on 12/22/2015  INTERVAL HISTORY: Molly Campbell 55 y.o. 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  Starting systemic chemotherapy with weekly Cisplatin/Paclitaxel on 12/22/2015.    Malignant neoplasm of tonsillar fossa ([place])   10/05/2015 Imaging    CT imaging [place], bilateral palatine tonsil enlargement with asymmetric abnormality of R tonsil and soft palate, possible R cervical adenopathy      10/25/2015 Procedure    Direct laryngoscopy and biopsyof right tonsillar mass.      10/27/2015 Pathology Results    invasive squamous cell carcinoma, p16 NEGATIVE      11/10/2015 PET scan    Bilateral palatine tonsil enlargement and hypermetabolism, with asymmetric hypermetabolism in the right palatine tonsil, consistent with primary malignancy in this location. Bilateral mildly enlarged hypermetabolic level 2 neck lymph nodes.      11/18/2015 Procedure    IR Gastrostomy Tube, fluoroscopic 20 French Pull through Gastrostomy      11/27/2015 Procedure    CT-guided core biopsy performed of the bilobed shaped soft tissue mass anterior to the proximal stomach.      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      12/07/2015 Imaging    NM Myocar multi w spect- Nuclear stress EF: 70%.      12/22/2015 -  Chemotherapy    Cisplatin/Paclitaxel weekly      She denies any complaints today.  She is tolerating therapy well.   Her weight is down because her niece was diagnosed with cancer.  She notes that this has affected her appetite.  She will work on eating more she notes.  She denies  any mechanical issues with eating.  She denies any nausea or vomiting.  Review of Systems  Constitutional: Positive for weight loss. Negative for chills and fever.  HENT: Negative.  Negative for sore throat.   Eyes: Negative.   Respiratory: Negative.  Negative for cough.   Cardiovascular: Negative.  Negative for chest pain.  Gastrointestinal: Negative.  Negative for abdominal pain, nausea and vomiting.  Genitourinary: Negative.   Musculoskeletal: Negative.   Skin: Negative.   Neurological: Negative.  Negative for weakness.  Endo/Heme/Allergies: Negative.   Psychiatric/Behavioral: Negative.     Past Medical History:  Diagnosis Date  . Alcohol abuse   . Alcoholic hepatitis 99991111  . Anemia   . Anxiety disorder   . Asthma   . Auditory hallucinations   . Cancer (HCC)    cervical  . Chronic back pain   . Chronic bronchitis with acute exacerbation ([place])   . Cocaine abuse   . Constipation   . COPD (chronic obstructive pulmonary disease) ([place])   . Depression   . Dysrhythmia 2012   hx AF in er-cocaine-converted back NSR  . Emphysema   . GERD (gastroesophageal reflux disease)   . Headache   . History of cervical cancer 09/26/2013  . Hypertension   . Malignant neoplasm of tonsillar fossa ([place]) 11/12/2015  . Pneumonia    hx  . Poor circulation   . Schizophrenia ([place])   . Scoliosis   . Tobacco abuse     Past Surgical History:  Procedure Laterality Date  . ABDOMINAL HYSTERECTOMY    . BREAST BIOPSY  2005   lt bx  . COLONOSCOPY  05/07/2002   Dr.Demason- large approximately 1.5cm polyp pedunculated aproximately 10cm from the anal verge bx= adenomatous polyp  . DIRECT LARYNGOSCOPY N/A 10/25/2015   Procedure: DIRECT LARYNGOSCOPY WITH BIOPSY OF ORAL PHARYNGEAL LESION;  Surgeon: Leta Baptist, MD;  Location: [facility];  Service: ENT;  Laterality: N/A;  DIRECT LARYNGOSCOPY WITH BIOPSY OF ORAL PHARYNGEAL LESION  . ESOPHAGOGASTRODUODENOSCOPY  06/02/2002   Dr. Anthony Sar- mild  distal esophagitis w/o stricturing or ulceration. stomach and pylorus are normal. inflammatory changes of proximal duldenum in the first portion.   . ESOPHAGOGASTRODUODENOSCOPY (EGD) WITH PROPOFOL N/A 11/23/2015   Procedure: ESOPHAGOGASTRODUODENOSCOPY (EGD) WITH PROPOFOL;  Surgeon: Danie Binder, MD;  [facility];  Service: Endoscopy;  Laterality: N/A;  1245  . FRACTURE SURGERY  right wrist   [place]  . MULTIPLE EXTRACTIONS WITH ALVEOLOPLASTY N/A 12/13/2015   Procedure: Extraction of tooth #'s 2,3,4,5,6,12,13,14,15,17,and 18 with alveoloplasty, bilateral maxillary tuberosity reductions, and gross debridement of remaining teeth;  Surgeon: Lenn Cal, DDS;  Location: [facility];  Service: Oral Surgery;  Laterality: N/A;  . PEG PLACEMENT  11/18/15  . PORTACATH PLACEMENT Right 11/18/15    Family History  Problem Relation Age of Onset  . Alzheimer's disease Mother   . Other Father     "he was beat to death"  . Heart disease Sister   . Gout Sister   . COPD Sister   . Seizures Sister   . Diabetes Sister   . Diabetes Brother   . Cancer Paternal Grandmother     not sure what kind  . Seizures Sister   . Other Sister     "bones are messed up"  . Hypertension Brother   . Heart attack Brother   . Other Brother     MVA  . Diabetes Brother   . Other Brother     MVA  . Alcohol abuse Daughter   . Other Son     motorcycle accident  . Heart failure Other     Social History   Social History  . Marital status: Widowed    Spouse name: N/A  . Number of children: 3  . Years of education: N/A   Social History Main Topics  . Smoking status: Current Every Day Smoker    Packs/day: 1.00    Years: 48.00    Types: Cigarettes  . Smokeless tobacco: Former Systems developer    Types: Snuff     Comment: One pack a day  . Alcohol use 0.0 oz/week     Comment: 40 oz a day  . Drug use:     Types: Cocaine, Marijuana     Comment: last used on Friday 8/18  . Sexual activity: Not Currently     Birth control/ protection: Surgical   Other Topics Concern  . Not on file   Social History Narrative  . No narrative on file     PHYSICAL EXAMINATION  ECOG PERFORMANCE STATUS: 1 - Symptomatic but completely ambulatory  There were no vitals filed for this visit.  Vitals - 1 value per visit [id number]  SYSTOLIC [id number]  DIASTOLIC 96  Pulse 93  Temperature 98.2  Respirations 18    GENERAL:alert, no distress, well nourished, well developed, comfortable, cooperative, smiling and in chemo-bed, unaccompanied SKIN: skin color, texture, turgor are normal, no rashes or significant lesions HEAD: Normocephalic, No masses, lesions,  tenderness or abnormalities EYES: normal, EOMI, Conjunctiva are pink and non-injected EARS: External ears normal OROPHARYNX:lips, buccal mucosa, and tongue normal and mucous membranes are moist  NECK: supple, trachea midline LYMPH:  no palpable lymphadenopathy BREAST:not examined LUNGS: clear to auscultation  HEART: regular rate & rhythm ABDOMEN:abdomen soft and normal bowel sounds BACK: Back symmetric, no curvature. EXTREMITIES:less then 2 second capillary refill, no joint deformities, effusion, or inflammation, no skin discoloration, no cyanosis  NEURO: alert & oriented x 3 with fluent speech, no focal motor/sensory deficits, gait normal  LABORATORY DATA: CBC    Component Value Date/Time   WBC 5.2 01/26/2016 0845   RBC 3.24 (L) 01/26/2016 0845   HGB 10.7 (L) 01/26/2016 0845   HCT 30.7 (L) 01/26/2016 0845   PLT 363 01/26/2016 0845   MCV 94.8 01/26/2016 0845   MCH 33.0 01/26/2016 0845   MCHC 34.9 01/26/2016 0845   RDW 14.4 01/26/2016 0845   LYMPHSABS 3.6 01/26/2016 0845   MONOABS 0.4 01/26/2016 0845   EOSABS 0.1 01/26/2016 0845   BASOSABS 0.1 01/26/2016 0845      Chemistry      Component Value Date/Time   NA 137 01/26/2016 0845   K 3.6 01/26/2016 0845   CL 106 01/26/2016 0845   CO2 24 01/26/2016 0845   BUN 7 01/26/2016 0845   CREATININE 0.57  01/26/2016 0845   GLU 99 09/17/2015      Component Value Date/Time   CALCIUM 8.5 (L) 01/26/2016 0845   ALKPHOS 81 01/26/2016 0845   AST 22 01/26/2016 0845   ALT 14 01/26/2016 0845   BILITOT 0.6 01/26/2016 0845        PENDING LABS:   RADIOGRAPHIC STUDIES:  No results found.   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  Starting systemic chemotherapy with weekly Cisplatin/Paclitaxel on 12/22/2015.  Complicated by illicit drug abuse.  Oncology history is updated.  Pre-treatment labs today: CBC diff, CMET, Magnesium.  I personally reviewed and went over laboratory results with the patient.  The results are noted within this dictation.     She continues to tolerate therapy well without any patient reported complaints.    We reviewed her stage if cancer as she reports that she forgets her stage.  She has tolerated therapy well and her compliance has been established with [place].  She has only missed 1 treatment appointment as a result of her G-tube being addressed by IR.  She otherwise has been on time for all of her treatment appointments.  As a result, I have placed a call to Dr. Isidore Moos, radiation oncology, to develop a plan moving forward to incorporate XRT into the patient's treatment plan.  Dr. Isidore Moos and discussed a plan moving forward.  She has recommended restaging imaging to evaluate for progression of disease.  If no progression, then referral to XRT for consideration of concomitant chemoXRT is appropriate.  Dr. Isidore Moos notes that she may be simulated at the same time as her consultation.  Order placed for PET and CT of neck with contrast.  PET scan is to evaluate pulmonary nodules and GIST while CT of neck is to stage neck head/neck disease.  Her weight is down and she attributes this to some personal stressors in her life, namely her niece's diagnosis of cancer.  She  will increase her caloric intake.  She denies any nausea/vomiting, or mechanical issues with food consumption.  Return as scheduled for IVF .  Return in 1 week for treatment and follow-up appointment.   ORDERS PLACED FOR THIS ENCOUNTER: Orders Placed This Encounter  Procedures  . NM PET Image Restag (PS) Skull Base To Thigh  . CT SOFT TISSUE NECK W CONTRAST    MEDICATIONS PRESCRIBED THIS ENCOUNTER: No orders of the defined types were placed in this encounter.   THERAPY PLAN:  Continue treatment with supportive care as outlined above.  All questions were answered. The patient knows to call the clinic with any problems, questions or concerns. We can certainly see the patient much sooner if necessary.  Patient and plan discussed with Dr. Ancil Linsey and she is in agreement with the aforementioned.   This note is electronically signed by: Robynn Pane, PA-C 01/26/2016 2:10 PM

## 2016-01-26 NOTE — Patient Instructions (Signed)
[facility] Discharge Instructions for Patients Receiving Chemotherapy   Beginning January 23rd 2017 lab work for the [facility] will be done in the  Main lab at [facility] on 1st floor. If you have a lab appointment with the [facility] please come in thru the  Main Entrance and check in at the main information desk   Today you received the following chemotherapy agents cisplatin, taxol  To help prevent nausea and vomiting after your treatment, we encourage you to take your nausea medication      If you develop nausea and vomiting, or diarrhea that is not controlled by your medication, call the clinic.  The clinic phone number is [phone]. Office hours are Monday-Friday 8:30am-5:00pm.  BELOW ARE SYMPTOMS THAT SHOULD BE REPORTED IMMEDIATELY:  *FEVER GREATER THAN 101.0 F  *CHILLS WITH OR WITHOUT FEVER  NAUSEA AND VOMITING THAT IS NOT CONTROLLED WITH YOUR NAUSEA MEDICATION  *UNUSUAL SHORTNESS OF BREATH  *UNUSUAL BRUISING OR BLEEDING  TENDERNESS IN MOUTH AND THROAT WITH OR WITHOUT PRESENCE OF ULCERS  *URINARY PROBLEMS  *BOWEL PROBLEMS  UNUSUAL RASH Items with * indicate a potential emergency and should be followed up as soon as possible. If you have an emergency after office hours please contact your primary care physician or go to the nearest emergency department.  Please call the clinic during office hours if you have any questions or concerns.   You may also contact the Patient Navigator at [phone] should you have any questions or need assistance in obtaining follow up care.      Resources For Cancer Patients and their Caregivers ? American Cancer Society: Can assist with transportation, wigs, general needs, runs Look Good Feel Better.        (801)324-0595 ? Cancer Care: Provides financial assistance, online support groups, medication/co-pay assistance.  1-800-813-HOPE (939)205-8651) ? [facility] Assists  [place] cancer patients and their families through emotional , educational and financial support.  770-073-3653 ? [place] DSS Where to apply for food stamps, Medicaid and utility assistance. (229)093-9424 ? RCATS: Transportation to medical appointments. 720-641-6151 ? Social Security Administration: May apply for disability if have a Stage IV cancer. 480-248-0676 530-702-9955 ? [employer], Disability and Transit Services: Assists with nutrition, care and transit needs. ([phone]

## 2016-01-26 NOTE — Assessment & Plan Note (Addendum)
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  Starting systemic chemotherapy with weekly Cisplatin/Paclitaxel on 12/22/2015.  Complicated by illicit drug abuse.  Oncology history is updated.  Pre-treatment labs today: CBC diff, CMET, Magnesium.  I personally reviewed and went over laboratory results with the patient.  The results are noted within this dictation.     She continues to tolerate therapy well without any patient reported complaints.    We reviewed her stage if cancer as she reports that she forgets her stage.  She has tolerated therapy well and her compliance has been established with [place].  She has only missed 1 treatment appointment as a result of her G-tube being addressed by IR.  She otherwise has been on time for all of her treatment appointments.  As a result, I have placed a call to Dr. Isidore Moos, radiation oncology, to develop a plan moving forward to incorporate XRT into the patient's treatment plan.  Dr. Isidore Moos and discussed a plan moving forward.  She has recommended restaging imaging to evaluate for progression of disease.  If no progression, then referral to XRT for consideration of concomitant chemoXRT is appropriate.  Dr. Isidore Moos notes that she may be simulated at the same time as her consultation.  Order placed for PET and CT of neck with contrast.  PET scan is to evaluate pulmonary nodules and GIST while CT of neck is to stage neck head/neck disease.  Her weight is down and she attributes this to some personal stressors in her life, namely her niece's diagnosis of cancer.  She will increase her caloric intake.  She denies any nausea/vomiting, or mechanical issues with food consumption.  Return as scheduled for IVF .  Return in 1 week for treatment and follow-up appointment.

## 2016-01-27 ENCOUNTER — Telehealth (HOSPITAL_COMMUNITY): Payer: Self-pay | Admitting: Emergency Medicine

## 2016-01-27 NOTE — Telephone Encounter (Signed)
Pt called to verify appointments for next week.  She is going to see Dr Whitney Muse on 02-02-2016 at 9:10 am, then 02-03-2016 at 7:30 she is going to [facility] in [place] for her PET scan and CT scan.  Pt verbalized understanding.

## 2016-01-28 ENCOUNTER — Encounter (HOSPITAL_COMMUNITY): Payer: Medicaid Other

## 2016-01-30 ENCOUNTER — Encounter (HOSPITAL_COMMUNITY): Payer: Self-pay | Admitting: Hematology & Oncology

## 2016-01-31 ENCOUNTER — Encounter (HOSPITAL_COMMUNITY): Payer: Medicaid Other

## 2016-02-02 ENCOUNTER — Telehealth (HOSPITAL_COMMUNITY): Payer: Self-pay | Admitting: Oncology

## 2016-02-02 ENCOUNTER — Ambulatory Visit (HOSPITAL_COMMUNITY): Payer: Self-pay | Admitting: Hematology & Oncology

## 2016-02-02 ENCOUNTER — Other Ambulatory Visit (HOSPITAL_COMMUNITY): Payer: Self-pay | Admitting: Oncology

## 2016-02-02 DIAGNOSIS — K3189 Other diseases of stomach and duodenum: Secondary | ICD-10-CM

## 2016-02-02 DIAGNOSIS — C090 Malignant neoplasm of tonsillar fossa: Secondary | ICD-10-CM

## 2016-02-02 DIAGNOSIS — R918 Other nonspecific abnormal finding of lung field: Secondary | ICD-10-CM

## 2016-02-02 NOTE — Telephone Encounter (Signed)
Peer to peer completed for PET and CT neck.  CT of neck is approved.  PET is denied.  I was able to get CT CAP w contrast approved.  Approval code: [id number].  Robynn Pane, PA-C 02/02/2016 12:46 PM

## 2016-02-03 ENCOUNTER — Other Ambulatory Visit (HOSPITAL_COMMUNITY): Payer: Self-pay | Admitting: Oncology

## 2016-02-03 ENCOUNTER — Telehealth (HOSPITAL_COMMUNITY): Payer: Self-pay | Admitting: *Deleted

## 2016-02-03 ENCOUNTER — Encounter (HOSPITAL_COMMUNITY): Payer: Medicaid Other

## 2016-02-03 ENCOUNTER — Ambulatory Visit (HOSPITAL_COMMUNITY): Payer: Medicaid Other

## 2016-02-03 DIAGNOSIS — C090 Malignant neoplasm of tonsillar fossa: Secondary | ICD-10-CM

## 2016-02-03 MED ORDER — HYDROCODONE-ACETAMINOPHEN 5-325 MG PO TABS: 1.0000 | ORAL_TABLET | ORAL | 0 refills | Status: DC | PRN

## 2016-02-04 ENCOUNTER — Encounter (HOSPITAL_COMMUNITY): Admission: RE | Admit: 2016-02-04 | Payer: Medicaid Other | Source: Ambulatory Visit

## 2016-02-07 ENCOUNTER — Encounter (HOSPITAL_COMMUNITY)
Admission: RE | Admit: 2016-02-07 | Discharge: 2016-02-07 | Disposition: A | Discharge status: Home / Self Care | Payer: Medicaid Other | Source: Ambulatory Visit | Attending: Hematology & Oncology | Admitting: Hematology & Oncology

## 2016-02-07 NOTE — Progress Notes (Signed)
Patient did not show up for appointment to have IV fluids infused. This was the second time she didn't show or call. Cancer center notified.

## 2016-02-09 ENCOUNTER — Ambulatory Visit (INDEPENDENT_AMBULATORY_CARE_PROVIDER_SITE_OTHER): Payer: Medicaid Other | Admitting: Cardiology

## 2016-02-09 ENCOUNTER — Encounter: Payer: Self-pay | Admitting: Cardiology

## 2016-02-09 ENCOUNTER — Other Ambulatory Visit (HOSPITAL_COMMUNITY): Payer: Self-pay | Admitting: Oncology

## 2016-02-09 ENCOUNTER — Ambulatory Visit (HOSPITAL_COMMUNITY): Payer: Self-pay | Admitting: Oncology

## 2016-02-09 VITALS — BP 110/72 | HR 82 | Ht 62.0 in | Wt 106.0 lb

## 2016-02-09 DIAGNOSIS — I5189 Other ill-defined heart diseases: Secondary | ICD-10-CM

## 2016-02-09 DIAGNOSIS — I519 Heart disease, unspecified: Secondary | ICD-10-CM | POA: Diagnosis not present

## 2016-02-09 DIAGNOSIS — M79606 Pain in leg, unspecified: Secondary | ICD-10-CM | POA: Diagnosis not present

## 2016-02-09 DIAGNOSIS — R0789 Other chest pain: Secondary | ICD-10-CM | POA: Diagnosis not present

## 2016-02-09 NOTE — Progress Notes (Signed)
Clinical Summary Ms. Nanton is a 55 y.o.female seen today for follow up of the following medical problems.   1. Abnormal EKG/Preoperative evaluation/Chest pain - being considered for dental surgery with mutilple extractions - in preop evaluation noted to have abnormal EKG with new diffuse precordial TWIs that were new - echo 2014 with LVEF 65-70%, no WMAs, moderate LVH. ?myxoma  - can have some chest pain. Off and on for year. Pressure like pain, left side. Can be severe. +SOB, feels hot/sweaty. Lasts about 30 mintues. Occurs 1-2 times a month   2. Afib - isolated episode, apparently related to [facility] abuse at the time.  - no recent palpitatoins   3. 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   5. 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   6. Leg pains - bilateral leg pains. Mainly with walking.    Past Medical History:  Diagnosis Date  . Alcohol abuse   . Alcoholic hepatitis [id number]  . Anemia   . Anxiety disorder   . Asthma   . Auditory hallucinations   . Cancer (HCC)    cervical  . Chronic back pain   . Chronic bronchitis with acute exacerbation ([place])   . Cocaine abuse   . Constipation   . COPD (chronic obstructive pulmonary disease) ([place])   . Depression   . Dysrhythmia 2012   hx AF in er-cocaine-converted back NSR  . Emphysema   . GERD (gastroesophageal reflux disease)   . Headache   . History of cervical cancer 09/26/2013  . Hypertension   . Malignant neoplasm of tonsillar fossa ([place]) 11/12/2015  . Pneumonia    hx  . Poor circulation   . Schizophrenia ([place])   . Scoliosis   . Tobacco abuse      Allergies  Allergen Reactions  . Benicar [Olmesartan] Palpitations and Other (See Comments)    TACHYCARDIA She was told to never take it again  .  Ibuprofen Palpitations    TACHYCARDIA  . Latex Dermatitis    Latex bandages - little sores      Current Outpatient Prescriptions  Medication Sig Dispense Refill  . albuterol (PROVENTIL HFA;VENTOLIN HFA) 108 (90 BASE) MCG/ACT inhaler Inhale 1-2 puffs into the lungs every 6 (six) hours as needed for wheezing or shortness of breath. 1 Inhaler 0  . amLODipine (NORVASC) 5 MG tablet Take 5 mg by mouth daily.    Marland Kitchen CISPLATIN IV Inject into the vein.    Marland Kitchen dexamethasone (DECADRON) 4 MG tablet Take 2 tablets by mouth once a day on the day after chemotherapy and then take 2 tablets two times a day for 2 days. Take with food. 30 tablet 1  . HYDROcodone-acetaminophen (NORCO) 5-325 MG tablet Take 1-2 tablets by mouth every 4 (four) hours as needed for moderate pain or severe pain. 45 tablet 0  . lidocaine (XYLOCAINE) 2 % solution Mix 1 part 2%viscous lidocaine,1part H2O.Swish and swallow 65mL of this mixture,6min before meals and at bedtime, up to QID 100 mL 5  . lidocaine-prilocaine (EMLA) cream Apply to affected area once 30 g 3  . Multiple Vitamins-Minerals (MULTIVITAMIN) tablet Take 1 tablet by mouth daily. 180 tablet 1  . nicotine (NICODERM CQ - DOSED IN MG/24 HOURS) 14 mg/24hr patch Place 1 patch (14 mg total) onto the  skin daily. Apply 21 mg patch daily x 6 wk, then 14mg  patch daily x 2 wk, then 7 mg patch daily x 2 wk 14 patch 0  . nicotine (NICODERM CQ - DOSED IN MG/24 HOURS) 21 mg/24hr patch Place 1 patch (21 mg total) onto the skin daily. Apply 21 mg patch daily x 6 wk, then 14mg  patch daily x 2 wk, then 7 mg patch daily x 2 wk 14 patch 2  . nicotine (NICODERM CQ - DOSED IN MG/24 HR) 7 mg/24hr patch Place 1 patch (7 mg total) onto the skin daily. Apply 21 mg patch daily x 6 wk, then 14mg  patch daily x 2 wk, then 7 mg patch daily x 2 wk 14 patch 0  . Nutritional Supplements (FEEDING SUPPLEMENT, OSMOLITE 1.5 CAL,) LIQD 5 cans via PEG, split into 3 meals of 1.5, 1.5 and 2 cans. Flush with 150 cc  water before and after each feed (Patient taking differently: by PEG Tube route See admin instructions. 5 cans via PEG, split into 3 meals of 1.5, 1.5 and 2 cans. Flush with 150 cc water before and after each feed)  0  . omeprazole (PRILOSEC) 20 MG capsule 1 PO EVERY MORNING 31 capsule 11  . ondansetron (ZOFRAN) 8 MG tablet Take 1 tablet (8 mg total) by mouth 2 (two) times daily as needed. Start on the third day after chemotherapy. 30 tablet 1  . PACLitaxel (TAXOL IV) Inject into the vein. weekly    . polyethylene glycol powder (GLYCOLAX/MIRALAX) powder Take one capsule and mix in 4-6 ounces of water, take one to two times daily as needed for constipation. 527 g 3  . prochlorperazine (COMPAZINE) 10 MG tablet Take 1 tablet (10 mg total) by mouth every 6 (six) hours as needed (Nausea or vomiting). 30 tablet 1  . sodium fluoride (FLUORISHIELD) 1.1 % GEL dental gel Instill one drop of gel per tooth space of fluoride tray. Place over teeth for 5 minutes. Remove. Spit out excess. Repeat nightly. 120 mL 11  . thiamine (VITAMIN B-1) 100 MG tablet Take 1 tablet (100 mg total) by mouth daily. 180 tablet 1   No current facility-administered medications for this visit.    Facility-Administered Medications Ordered in Other Visits  Medication Dose Route Frequency Provider Last Rate Last Dose  . 0.9 %  sodium chloride infusion   Intravenous Continuous Manon Hilding Kefalas, PA-C      . 0.9 %  sodium chloride infusion   Intravenous Continuous Manon Hilding Kefalas, PA-C      . 0.9 %  sodium chloride infusion   Intravenous Continuous Manon Hilding Kefalas, PA-C      . 0.9 %  sodium chloride infusion   Intravenous Continuous Manon Hilding Kefalas, PA-C      . 0.9 %  sodium chloride infusion   Intravenous Continuous Manon Hilding Kefalas, PA-C      . 0.9 %  sodium chloride infusion   Intravenous Continuous Manon Hilding Kefalas, PA-C      . 0.9 %  sodium chloride infusion   Intravenous Continuous Baird Cancer, PA-C         Past  Surgical History:  Procedure Laterality Date  . ABDOMINAL HYSTERECTOMY    . BREAST BIOPSY  2005   lt bx  . COLONOSCOPY  05/07/2002   Dr.Demason- large approximately 1.5cm polyp pedunculated aproximately 10cm from the anal verge bx= adenomatous polyp  . DIRECT LARYNGOSCOPY N/A 10/25/2015   Procedure: DIRECT LARYNGOSCOPY WITH BIOPSY OF ORAL  PHARYNGEAL LESION;  Surgeon: Leta Baptist, MD;  Location: [facility];  Service: ENT;  Laterality: N/A;  DIRECT LARYNGOSCOPY WITH BIOPSY OF ORAL PHARYNGEAL LESION  . ESOPHAGOGASTRODUODENOSCOPY  06/02/2002   Dr. Anthony Sar- mild distal esophagitis w/o stricturing or ulceration. stomach and pylorus are normal. inflammatory changes of proximal duldenum in the first portion.   . ESOPHAGOGASTRODUODENOSCOPY (EGD) WITH PROPOFOL N/A 11/23/2015   Procedure: ESOPHAGOGASTRODUODENOSCOPY (EGD) WITH PROPOFOL;  Surgeon: Danie Binder, MD;  [facility];  Service: Endoscopy;  Laterality: N/A;  1245  . FRACTURE SURGERY  right wrist   [place]  . MULTIPLE EXTRACTIONS WITH ALVEOLOPLASTY N/A 12/13/2015   Procedure: Extraction of tooth #'s 2,3,4,5,6,12,13,14,15,17,and 18 with alveoloplasty, bilateral maxillary tuberosity reductions, and gross debridement of remaining teeth;  Surgeon: Lenn Cal, DDS;  Location: [place];  Service: Oral Surgery;  Laterality: N/A;  . PEG PLACEMENT  11/18/15  . PORTACATH PLACEMENT Right 11/18/15     Allergies  Allergen Reactions  . Benicar [Olmesartan] Palpitations and Other (See Comments)    TACHYCARDIA She was told to never take it again  . Ibuprofen Palpitations    TACHYCARDIA  . Latex Dermatitis    Latex bandages - little sores       Family History  Problem Relation Age of Onset  . Alzheimer's disease Mother   . Other Father     "he was beat to death"  . Heart disease Sister   . Gout Sister   . COPD Sister   . Seizures Sister   . Diabetes Sister   . Diabetes Brother   . Cancer Paternal Grandmother      not sure what kind  . Seizures Sister   . Other Sister     "bones are messed up"  . Hypertension Brother   . Heart attack Brother   . Other Brother     MVA  . Diabetes Brother   . Other Brother     MVA  . Alcohol abuse Daughter   . Other Son     motorcycle accident  . Heart failure Other      Social History Ms. Ostrovsky reports that she has been smoking Cigarettes.  She has a 48.00 pack-year smoking history. She has quit using smokeless tobacco. Her smokeless tobacco use included Snuff. Ms. Malzone reports that she drinks alcohol.   Review of Systems CONSTITUTIONAL: No weight loss, fever, chills, weakness or fatigue.  HEENT: Eyes: No visual loss, blurred vision, double vision or yellow sclerae.No hearing loss, sneezing, congestion, runny nose or sore throat.  SKIN: No rash or itching.  CARDIOVASCULAR: per HPI RESPIRATORY: No shortness of breath, cough or sputum.  GASTROINTESTINAL: No anorexia, nausea, vomiting or diarrhea. No abdominal pain or blood.  GENITOURINARY: No burning on urination, no polyuria NEUROLOGICAL: No headache, dizziness, syncope, paralysis, ataxia, numbness or tingling in the extremities. No change in bowel or bladder control.  MUSCULOSKELETAL: No muscle, back pain, joint pain or stiffness.  LYMPHATICS: No enlarged nodes. No history of splenectomy.  PSYCHIATRIC: No history of depression or anxiety.  ENDOCRINOLOGIC: No reports of sweating, cold or heat intolerance. No polyuria or polydipsia.  Marland Kitchen   Physical Examination Vitals:   02/09/16 1147  BP: 110/72  Pulse: 82   Vitals:   02/09/16 1147  Weight: 106 lb (48.1 kg)  Height: 5\' 2"  (1.575 m)    Gen: resting comfortably, no acute distress HEENT: no scleral icterus, pupils equal round and reactive, no palptable cervical adenopathy,  CV:  RRR, no m/r/g, no jvd Resp: Clear to auscultation bilaterally GI: abdomen is soft, non-tender, non-distended, normal bowel sounds, no hepatosplenomegaly MSK:  extremities are warm, no edema.  Skin: warm, no rash Neuro:  no focal deficits Psych: appropriate affect   Diagnostic Studies 01/2013 Echo Study Conclusions  - Left ventricle: The cavity size was normal. There was moderate concentric hypertrophy. Systolic function was vigorous. The estimated ejection fraction was in the range of 65% to 70%. Wall motion was normal; there were no regional wall motion abnormalities. Left ventricular diastolic function parameters were normal. - Mitral valve: Mildly thickened leaflets. Mild regurgitation. - Right atrium: Central venous pressure: 75mm Hg (est). - Atrial septum: Rounded echodensity on left atrialside of septum, approximately 58mm x 69mm, suggestive of myxoma. No stalk seen. Not very mobile. - Tricuspid valve: Trivial regurgitation. - Pulmonary arteries: PA peak pressure: 47mm Hg (S). - Pericardium, extracardiac: There was no pericardial effusion. Impressions:  - No prior study for comparison. Moderate LVH with LVEF [id number], normal diastolic function. Rounded echodensity on left atrial side of septum, approximately 105mm x 58mm, suggestive of myxoma. No stalk seen. Not very mobile.   11/2015 echo Study Conclusions  - Left ventricle: The cavity size was normal. There was mild focal   basal hypertrophy of the septum. Systolic function was normal.   The estimated ejection fraction was in the range of 60% to 65%.   Wall motion was normal; there were no regional wall motion   abnormalities. Left ventricular diastolic function parameters   were normal. - Aortic valve: Mildly calcified annulus. Trileaflet. - Mitral valve: Mildly calcified leaflets . There was trivial   regurgitation. - Right atrium: Central venous pressure (est): 3 mm Hg. - Tricuspid valve: There was trivial regurgitation. - Pulmonary arteries: PA peak pressure: 25 mm Hg (S). - Pericardium, extracardiac: There was no pericardial  effusion.  Impressions:  - Mild basal septal LV hypertrophy with LVEF 60-65%. Normal   diastolic function. Mildly calcified mitral leaflets with trivial   mitral regurgitation. Trivial tricuspid regurgitation with PASP   25 mmHg. There is a rounded echodensity affixed to the left   atrial side of the interatrial septum without obvious stalk.   Structure measures approximately 17 mm x 17 mm. This is   consistent with an atrial myxoma. This abnormality was described   on the previous study in 2014 as well.   12/2015 MPI  No clearly diagnostic ST segment changes to indicate ischemia, however progressive T-wave inversion noted anterolaterally with Lexiscan in the absence of chest pain.  Small, mild intensity, partially reversible basal anteroseptal defect that is suggestive of variable soft tissue attenuation, less likely ischemic zone but not completely excluded.  This is a low risk study.  Nuclear stress EF: 70%.   Assessment and Plan   1. Chest pain - recent stress test without clear ischemia - no further workup at this time.   2. 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 No further workup at this time, would not be a surgical candidate given her metastatic cancer.  - continue to monitoro  3. Afib - appears to have been isolated episode in setting of substance abuse, conitnue to monitor for recurrence. She has not been committed to anticoagulation at this point.   4. Leg pains - order ABIs    Arnoldo Lenis, M.D.

## 2016-02-09 NOTE — Patient Instructions (Signed)
Medication Instructions:  Your physician recommends that you continue on your current medications as directed. Please refer to the Current Medication list given to you today.   Labwork: NONE  Testing/Procedures: Your physician has requested that you have an ankle brachial index (ABI). During this test an ultrasound and blood pressure cuff are used to evaluate the arteries that supply the arms and legs with blood. Allow thirty minutes for this exam. There are no restrictions or special instructions.    Follow-Up: Your physician wants you to follow-up in: 6 MONTHS .  You will receive a reminder letter in the mail two months in advance. If you don't receive a letter, please call our office to schedule the follow-up appointment.   Any Other Special Instructions Will Be Listed Below (If Applicable).     If you need a refill on your cardiac medications before your next appointment, please call your pharmacy.   

## 2016-02-10 ENCOUNTER — Ambulatory Visit (HOSPITAL_COMMUNITY): Admission: RE | Admit: 2016-02-10 | Payer: Medicaid Other | Source: Ambulatory Visit

## 2016-02-11 ENCOUNTER — Ambulatory Visit (HOSPITAL_COMMUNITY): Payer: Self-pay

## 2016-02-11 ENCOUNTER — Encounter (HOSPITAL_COMMUNITY): Payer: Medicaid Other

## 2016-02-11 MED ORDER — POTASSIUM CHLORIDE 2 MEQ/ML IV SOLN
Freq: Once | INTRAVENOUS | Status: DC
  Filled 2016-02-11: qty 10

## 2016-02-11 NOTE — Progress Notes (Unsigned)
0845:  Called pt's daughter with no answer and full voice mail box.  Called pt's sister [name]) - no answer; left voice message that pt had an 8:30 am chemo appointment, to please call the clinic to let us know if the pt is coming.

## 2016-02-14 ENCOUNTER — Encounter (HOSPITAL_COMMUNITY): Admission: RE | Admit: 2016-02-14 | Payer: Medicaid Other | Source: Ambulatory Visit

## 2016-02-14 ENCOUNTER — Ambulatory Visit (HOSPITAL_COMMUNITY): Admission: RE | Admit: 2016-02-14 | Payer: Medicaid Other | Source: Ambulatory Visit

## 2016-02-14 ENCOUNTER — Ambulatory Visit (HOSPITAL_COMMUNITY)
Admission: RE | Admit: 2016-02-14 | Discharge: 2016-02-14 | Disposition: A | Discharge status: Home / Self Care | Payer: Medicaid Other | Source: Ambulatory Visit | Attending: Cardiology | Admitting: Cardiology

## 2016-02-14 DIAGNOSIS — M79606 Pain in leg, unspecified: Secondary | ICD-10-CM | POA: Diagnosis present

## 2016-02-15 ENCOUNTER — Encounter (HOSPITAL_COMMUNITY)
Admission: RE | Admit: 2016-02-15 | Discharge: 2016-02-15 | Disposition: A | Discharge status: Home / Self Care | Payer: Medicaid Other | Source: Ambulatory Visit | Attending: Hematology & Oncology | Admitting: Hematology & Oncology

## 2016-02-15 DIAGNOSIS — C090 Malignant neoplasm of tonsillar fossa: Secondary | ICD-10-CM | POA: Diagnosis present

## 2016-02-15 MED ORDER — HEPARIN SOD (PORK) LOCK FLUSH 100 UNIT/ML IV SOLN
500.0000 [IU] | INTRAVENOUS | Status: AC | PRN
Start: 2016-02-15 — End: 2016-02-15
  Administered 2016-02-15: 500 [IU]
  Filled 2016-02-15: qty 5

## 2016-02-15 MED ORDER — SODIUM CHLORIDE 0.9 % IV SOLN
INTRAVENOUS | Status: DC
  Administered 2016-02-15: 10:00:00 via INTRAVENOUS

## 2016-02-18 ENCOUNTER — Encounter (HOSPITAL_COMMUNITY): Payer: Medicaid Other

## 2016-02-18 ENCOUNTER — Encounter: Payer: Self-pay | Admitting: Dietician

## 2016-02-18 ENCOUNTER — Encounter (HOSPITAL_COMMUNITY): Payer: Medicaid Other | Attending: Oncology

## 2016-02-18 ENCOUNTER — Other Ambulatory Visit (HOSPITAL_COMMUNITY): Payer: Self-pay | Admitting: Oncology

## 2016-02-18 VITALS — BP 134/78 | HR 88 | Temp 98.2°F | Resp 18 | Wt 103.4 lb

## 2016-02-18 DIAGNOSIS — F1410 Cocaine abuse, uncomplicated: Secondary | ICD-10-CM | POA: Diagnosis not present

## 2016-02-18 DIAGNOSIS — D473 Essential (hemorrhagic) thrombocythemia: Secondary | ICD-10-CM | POA: Diagnosis present

## 2016-02-18 DIAGNOSIS — Z5111 Encounter for antineoplastic chemotherapy: Secondary | ICD-10-CM | POA: Diagnosis present

## 2016-02-18 DIAGNOSIS — E876 Hypokalemia: Secondary | ICD-10-CM | POA: Insufficient documentation

## 2016-02-18 DIAGNOSIS — C090 Malignant neoplasm of tonsillar fossa: Secondary | ICD-10-CM | POA: Diagnosis present

## 2016-02-18 LAB — COMPREHENSIVE METABOLIC PANEL
ALT: 16 U/L (ref 14–54)
AST: 23 U/L (ref 15–41)
Albumin: 4.1 g/dL (ref 3.5–5.0)
Alkaline Phosphatase: 63 U/L (ref 38–126)
Anion gap: 10 (ref 5–15)
BUN: <5 mg/dL — ABNORMAL LOW (ref 6–20)
CHLORIDE: 105 mmol/L (ref 101–111)
CO2: 23 mmol/L (ref 22–32)
Calcium: 8.8 mg/dL — ABNORMAL LOW (ref 8.9–10.3)
Creatinine, Ser: 0.54 mg/dL (ref 0.44–1.00)
Glucose, Bld: 93 mg/dL (ref 65–99)
POTASSIUM: 3.6 mmol/L (ref 3.5–5.1)
Sodium: 138 mmol/L (ref 135–145)
TOTAL PROTEIN: 6.9 g/dL (ref 6.5–8.1)
Total Bilirubin: 0.3 mg/dL (ref 0.3–1.2)

## 2016-02-18 LAB — CBC WITH DIFFERENTIAL/PLATELET
BASOS ABS: 0 10*3/uL (ref 0.0–0.1)
BASOS PCT: 1 %
EOS ABS: 0.1 10*3/uL (ref 0.0–0.7)
EOS PCT: 1 %
HCT: 34.5 % — ABNORMAL LOW (ref 36.0–46.0)
Hemoglobin: 11.9 g/dL — ABNORMAL LOW (ref 12.0–15.0)
Lymphocytes Relative: 62 %
Lymphs Abs: 3.2 10*3/uL (ref 0.7–4.0)
MCH: 34.3 pg — AB (ref 26.0–34.0)
MCHC: 34.5 g/dL (ref 30.0–36.0)
MCV: 99.4 fL (ref 78.0–100.0)
Monocytes Absolute: 0.7 10*3/uL (ref 0.1–1.0)
Monocytes Relative: 14 %
Neutro Abs: 1.2 10*3/uL — ABNORMAL LOW (ref 1.7–7.7)
Neutrophils Relative %: 22 %
PLATELETS: 244 10*3/uL (ref 150–400)
RBC: 3.47 MIL/uL — AB (ref 3.87–5.11)
RDW: 18.1 % — ABNORMAL HIGH (ref 11.5–15.5)
WBC: 5.2 10*3/uL (ref 4.0–10.5)

## 2016-02-18 LAB — MAGNESIUM: MAGNESIUM: 1.4 mg/dL — AB (ref 1.7–2.4)

## 2016-02-18 MED ORDER — HYDROCODONE-ACETAMINOPHEN 5-325 MG PO TABS: 1.0000 | ORAL_TABLET | ORAL | 0 refills | Status: DC | PRN

## 2016-02-18 MED ORDER — POTASSIUM CHLORIDE 2 MEQ/ML IV SOLN
Freq: Once | INTRAVENOUS | Status: AC
  Administered 2016-02-18: 10:00:00 via INTRAVENOUS
  Filled 2016-02-18: qty 10

## 2016-02-18 MED ORDER — SODIUM CHLORIDE 0.9 % IV SOLN
Freq: Once | INTRAVENOUS | Status: AC
  Administered 2016-02-18: 12:00:00 via INTRAVENOUS
  Filled 2016-02-18: qty 5

## 2016-02-18 MED ORDER — ACETAMINOPHEN 325 MG PO TABS
650.0000 mg | ORAL_TABLET | Freq: Once | ORAL | Status: AC
  Administered 2016-02-18: 650 mg via ORAL
  Filled 2016-02-18: qty 2

## 2016-02-18 MED ORDER — FAMOTIDINE IN NACL 20-0.9 MG/50ML-% IV SOLN
20.0000 mg | Freq: Once | INTRAVENOUS | Status: AC
  Administered 2016-02-18: 20 mg via INTRAVENOUS
  Filled 2016-02-18: qty 50

## 2016-02-18 MED ORDER — SODIUM CHLORIDE 0.9% FLUSH
10.0000 mL | INTRAVENOUS | Status: DC | PRN
  Administered 2016-02-18: 10 mL
  Filled 2016-02-18: qty 10

## 2016-02-18 MED ORDER — PACLITAXEL CHEMO INJECTION 300 MG/50ML
80.0000 mg/m2 | Freq: Once | INTRAVENOUS | Status: AC
  Administered 2016-02-18: 120 mg via INTRAVENOUS
  Filled 2016-02-18: qty 20

## 2016-02-18 MED ORDER — HEPARIN SOD (PORK) LOCK FLUSH 100 UNIT/ML IV SOLN
500.0000 [IU] | Freq: Once | INTRAVENOUS | Status: AC | PRN
  Administered 2016-02-18: 500 [IU]
  Filled 2016-02-18: qty 5

## 2016-02-18 MED ORDER — SODIUM CHLORIDE 0.9 % IV SOLN
Freq: Once | INTRAVENOUS | Status: AC
  Administered 2016-02-18: 11:00:00 via INTRAVENOUS

## 2016-02-18 MED ORDER — SODIUM CHLORIDE 0.9 % IV SOLN
40.0000 mg/m2 | Freq: Once | INTRAVENOUS | Status: AC
  Administered 2016-02-18: 59 mg via INTRAVENOUS
  Filled 2016-02-18: qty 59

## 2016-02-18 MED ORDER — ACETAMINOPHEN 325 MG PO TABS
ORAL_TABLET | ORAL | Status: AC
  Filled 2016-02-18: qty 2

## 2016-02-18 MED ORDER — DIPHENHYDRAMINE HCL 50 MG/ML IJ SOLN
INTRAMUSCULAR | Status: AC
  Filled 2016-02-18: qty 1

## 2016-02-18 MED ORDER — PALONOSETRON HCL INJECTION 0.25 MG/5ML
0.2500 mg | Freq: Once | INTRAVENOUS | Status: AC
  Administered 2016-02-18: 0.25 mg via INTRAVENOUS
  Filled 2016-02-18: qty 5

## 2016-02-18 MED ORDER — FUROSEMIDE 10 MG/ML IJ SOLN
20.0000 mg | Freq: Once | INTRAMUSCULAR | Status: AC
  Administered 2016-02-18: 20 mg via INTRAVENOUS
  Filled 2016-02-18: qty 2

## 2016-02-18 MED ORDER — DIPHENHYDRAMINE HCL 50 MG/ML IJ SOLN
50.0000 mg | Freq: Once | INTRAMUSCULAR | Status: AC
  Administered 2016-02-18: 50 mg via INTRAVENOUS
  Filled 2016-02-18: qty 1

## 2016-02-18 NOTE — Progress Notes (Signed)
F/U with high risk H&N patient w/ PEG.   55 y/o female PMHx : Tobacco abuse (48 pack year), severe etoh abuse (6 pack or 40 oz daily), illicit drug abuse,HTN, alcoholic hepatitis, COPD,Cervical cancer,GERD, anxiety, schizophrenia,mental retardation(2014)Esophageal Dysphagia (2015)  She was seen about 1.5 months ago. She has had multiple "no shows" in the interim.    Contacted Pt by visiting during her infusion.   Wt Readings from Last 10 Encounters:  02/18/16 103 lb 6.4 oz (46.9 kg)  02/09/16 106 lb (48.1 kg)  01/26/16 105 lb 6.4 oz (47.8 kg)  01/19/16 111 lb (50.3 kg)  01/07/16 110 lb (49.9 kg)  01/05/16 110 lb 3.2 oz (50 kg)  12/31/15 108 lb (49 kg)  12/29/15 108 lb 4.8 oz (49.1 kg)  12/27/15 111 lb (50.3 kg)  12/22/15 111 lb (50.3 kg)   Patient weight has decreased by about 8 lbs in the last month.   Pt has become much harder to communicate with since she was initially seen. Today, she is largely unintelligible and her responses often are nonsensical or unrelated to what was asked. She is a very poor historian and lives by herself, as such, much of history is unable to be obtained.   From what could gather, she has not been using her PEG tube at all, unclear what for what duration. She rarely flushedl. She says when she did flush it with water it would cause diarrhea.    Instead of placing the Osmolite 1.5 through the tube, she would occasionally drink a can. This was the problem when she first got her PEG; she was much more apt to drink the tube feeding then place it through her PEG.   Her PO intake has decreased. Her niece was diagnosed with cancer and this apparently caused her appetite to worsen. Additionally, she complains about her lack of teeth which inhibits from having her meats. She says "I cant chew". RD asks if she has a food processor or blender that she could use to make the food easier to eat. She then tells RD that she IS able to eat her foods the way they are.  RD then asks again why she isnt eating and she says "cause I have no teeth".   It sounds like she is able to chew her food, but it is just more difficult for her. She again is recommended to use a food processor. This should not change the way the foods taste.  RD gave handout on foods that are easy to chew and swallow. RD listed softer Items. She stated she did like PB and cheese. RD asked her to prioritize these.   Unfortunately, the patient has already utilized all 3 of her free cases of Ensure. Many different samples and coupons are given to her today.   RD asked her to restart the TF. She grimaced when RD mentioned using her PEG. RD asked her if she could do just 1 can daily. She said she thought she could.   Pt asked why she couldn't just have as much Ensure as she does Tube feeding cans because she can drinks those so much easier. RD explained how radiation may make PO intake much or difficult or impossible. Upon discussing radiotherapy, pt immediately broke down and started crying and remained emotional/tearful for remainder of visit. Her caregiver at bedside spoke out how pt is extremely overwhelmed now and everything going on: "its too much for her.Marland KitchenMarland KitchenWe have seen the pictures (referring to after effects of  radiation)... She has too small a frame and thin skin.." Caregiver says her current pain is not well controlled. Caregiver says patient needs something "for her nerves" and to calm her down  RD offered emotional support and tried to comfort. Asked her just to do the best she could.   Ongoing concerns/ findings reiterated/discussed in depth with PA. May benefit from [facility] care consult.   Left my contact info, coupons, samples, and handouts titled "Easy-to-Chew-and-Swallow Food Choices".   Burtis Junes RD, LDN, [facility] Pager: [phone] 02/18/2016 9:23 AM

## 2016-02-18 NOTE — Progress Notes (Signed)
1000 Lab results, including ANC 1.2, and cardiology consult results reviewed with Kirby Crigler PA and pt approved for chemo tx today.                                               1100 Pt voided 300 ml clear yellow urine                             Molly Campbell tolerated chemo tx well without incident. VSS upon discharge. Pt discharged self ambulatory in stable condition with family

## 2016-02-18 NOTE — Patient Instructions (Signed)
[facility] Discharge Instructions for Patients Receiving Chemotherapy   Beginning January 23rd 2017 lab work for the [facility] will be done in the  Main lab at [facility] on 1st floor. If you have a lab appointment with the [facility] please come in thru the  Main Entrance and check in at the main information desk   Today you received the following chemotherapy agents Cisplatin and Taxol. Follow-up as scheduled. Call clinic for any questions or concerns  To help prevent nausea and vomiting after your treatment, we encourage you to take your nausea medication   If you develop nausea and vomiting, or diarrhea that is not controlled by your medication, call the clinic.  The clinic phone number is [phone]. Office hours are Monday-Friday 8:30am-5:00pm.  BELOW ARE SYMPTOMS THAT SHOULD BE REPORTED IMMEDIATELY:  *FEVER GREATER THAN 101.0 F  *CHILLS WITH OR WITHOUT FEVER  NAUSEA AND VOMITING THAT IS NOT CONTROLLED WITH YOUR NAUSEA MEDICATION  *UNUSUAL SHORTNESS OF BREATH  *UNUSUAL BRUISING OR BLEEDING  TENDERNESS IN MOUTH AND THROAT WITH OR WITHOUT PRESENCE OF ULCERS  *URINARY PROBLEMS  *BOWEL PROBLEMS  UNUSUAL RASH Items with * indicate a potential emergency and should be followed up as soon as possible. If you have an emergency after office hours please contact your primary care physician or go to the nearest emergency department.  Please call the clinic during office hours if you have any questions or concerns.   You may also contact the Patient Navigator at [phone] should you have any questions or need assistance in obtaining follow up care.      Resources For Cancer Patients and their Caregivers ? American Cancer Society: Can assist with transportation, wigs, general needs, runs Look Good Feel Better.        219-535-5361 ? Cancer Care: Provides financial assistance, online support groups, medication/co-pay assistance.   1-800-813-HOPE 208-553-2586) ? [place] Assists [place] cancer patients and their families through emotional , educational and financial support.  8476255003 ? [place] DSS Where to apply for food stamps, Medicaid and utility assistance. (641)805-7075 ? [facility]: Transportation to medical appointments. 417-099-8165 ? Social Security Administration: May apply for disability if have a Stage IV cancer. 2071648684 707-642-9774 ? [employer], Disability and Transit Services: Assists with nutrition, care and transit needs. (678)511-6990

## 2016-02-24 ENCOUNTER — Encounter (HOSPITAL_BASED_OUTPATIENT_CLINIC_OR_DEPARTMENT_OTHER): Payer: Medicaid Other | Admitting: Oncology

## 2016-02-24 ENCOUNTER — Encounter (HOSPITAL_COMMUNITY): Payer: Self-pay | Admitting: Oncology

## 2016-02-24 ENCOUNTER — Encounter (HOSPITAL_BASED_OUTPATIENT_CLINIC_OR_DEPARTMENT_OTHER): Payer: Medicaid Other

## 2016-02-24 VITALS — BP 184/88 | HR 75 | Temp 97.9°F | Resp 18 | Wt 105.6 lb

## 2016-02-24 DIAGNOSIS — Z5111 Encounter for antineoplastic chemotherapy: Secondary | ICD-10-CM

## 2016-02-24 DIAGNOSIS — C090 Malignant neoplasm of tonsillar fossa: Secondary | ICD-10-CM

## 2016-02-24 DIAGNOSIS — F1410 Cocaine abuse, uncomplicated: Secondary | ICD-10-CM | POA: Diagnosis not present

## 2016-02-24 LAB — CBC WITH DIFFERENTIAL/PLATELET
Basophils Absolute: 0 10*3/uL (ref 0.0–0.1)
Basophils Relative: 1 %
EOS ABS: 0.1 10*3/uL (ref 0.0–0.7)
Eosinophils Relative: 1 %
HEMATOCRIT: 30.4 % — AB (ref 36.0–46.0)
HEMOGLOBIN: 10.7 g/dL — AB (ref 12.0–15.0)
LYMPHS ABS: 2.6 10*3/uL (ref 0.7–4.0)
Lymphocytes Relative: 57 %
MCH: 35.2 pg — AB (ref 26.0–34.0)
MCHC: 35.2 g/dL (ref 30.0–36.0)
MCV: 100 fL (ref 78.0–100.0)
MONO ABS: 0.3 10*3/uL (ref 0.1–1.0)
MONOS PCT: 6 %
NEUTROS PCT: 35 %
Neutro Abs: 1.6 10*3/uL — ABNORMAL LOW (ref 1.7–7.7)
Platelets: 159 10*3/uL (ref 150–400)
RBC: 3.04 MIL/uL — ABNORMAL LOW (ref 3.87–5.11)
RDW: 16.5 % — AB (ref 11.5–15.5)
WBC: 4.6 10*3/uL (ref 4.0–10.5)

## 2016-02-24 LAB — COMPREHENSIVE METABOLIC PANEL
ALK PHOS: 52 U/L (ref 38–126)
ALT: 15 U/L (ref 14–54)
ANION GAP: 8 (ref 5–15)
AST: 21 U/L (ref 15–41)
Albumin: 3.9 g/dL (ref 3.5–5.0)
BILIRUBIN TOTAL: 0.8 mg/dL (ref 0.3–1.2)
BUN: 7 mg/dL (ref 6–20)
CALCIUM: 8.5 mg/dL — AB (ref 8.9–10.3)
CO2: 26 mmol/L (ref 22–32)
Chloride: 101 mmol/L (ref 101–111)
Creatinine, Ser: 0.58 mg/dL (ref 0.44–1.00)
GFR calc non Af Amer: >60 mL/min (ref >60)
Glucose, Bld: 99 mg/dL (ref 65–99)
Potassium: 3.6 mmol/L (ref 3.5–5.1)
SODIUM: 135 mmol/L (ref 135–145)
TOTAL PROTEIN: 6.6 g/dL (ref 6.5–8.1)

## 2016-02-24 LAB — MAGNESIUM: Magnesium: 1 mg/dL — ABNORMAL LOW (ref 1.7–2.4)

## 2016-02-24 MED ORDER — HYDROCODONE-ACETAMINOPHEN 5-325 MG PO TABS: 1.0000 | ORAL_TABLET | ORAL | 0 refills | Status: DC | PRN

## 2016-02-24 MED ORDER — SODIUM CHLORIDE 0.9% FLUSH
10.0000 mL | INTRAVENOUS | Status: DC | PRN
  Administered 2016-02-24: 10 mL
  Filled 2016-02-24: qty 10

## 2016-02-24 MED ORDER — FAMOTIDINE IN NACL 20-0.9 MG/50ML-% IV SOLN
20.0000 mg | Freq: Once | INTRAVENOUS | Status: AC
  Administered 2016-02-24: 20 mg via INTRAVENOUS
  Filled 2016-02-24: qty 50

## 2016-02-24 MED ORDER — MAGNESIUM SULFATE 50 % IJ SOLN
4.0000 g | Freq: Once | INTRAVENOUS | Status: AC
  Administered 2016-02-24: 4 g via INTRAVENOUS
  Filled 2016-02-24: qty 8

## 2016-02-24 MED ORDER — FUROSEMIDE 10 MG/ML IJ SOLN
20.0000 mg | Freq: Once | INTRAMUSCULAR | Status: AC
Start: 2016-02-24 — End: 2016-02-24
  Administered 2016-02-24: 20 mg via INTRAVENOUS
  Filled 2016-02-24: qty 2

## 2016-02-24 MED ORDER — SODIUM CHLORIDE 0.9 % IV SOLN
40.0000 mg/m2 | Freq: Once | INTRAVENOUS | Status: AC
  Administered 2016-02-24: 59 mg via INTRAVENOUS
  Filled 2016-02-24: qty 59

## 2016-02-24 MED ORDER — POTASSIUM CHLORIDE 2 MEQ/ML IV SOLN
Freq: Once | INTRAVENOUS | Status: AC
  Administered 2016-02-24: 09:00:00 via INTRAVENOUS
  Filled 2016-02-24: qty 10

## 2016-02-24 MED ORDER — DEXTROSE 5 % IV SOLN
80.0000 mg/m2 | Freq: Once | INTRAVENOUS | Status: AC
  Administered 2016-02-24: 120 mg via INTRAVENOUS
  Filled 2016-02-24: qty 20

## 2016-02-24 MED ORDER — SODIUM CHLORIDE 0.9 % IV SOLN
Freq: Once | INTRAVENOUS | Status: AC
  Administered 2016-02-24: 12:00:00 via INTRAVENOUS
  Filled 2016-02-24: qty 5

## 2016-02-24 MED ORDER — HEPARIN SOD (PORK) LOCK FLUSH 100 UNIT/ML IV SOLN
500.0000 [IU] | Freq: Once | INTRAVENOUS | Status: AC | PRN
  Administered 2016-02-24: 500 [IU]
  Filled 2016-02-24: qty 5

## 2016-02-24 MED ORDER — ACETAMINOPHEN 325 MG PO TABS
650.0000 mg | ORAL_TABLET | Freq: Once | ORAL | Status: AC
  Administered 2016-02-24: 650 mg via ORAL
  Filled 2016-02-24: qty 2

## 2016-02-24 MED ORDER — PALONOSETRON HCL INJECTION 0.25 MG/5ML
0.2500 mg | Freq: Once | INTRAVENOUS | Status: AC
  Administered 2016-02-24: 0.25 mg via INTRAVENOUS
  Filled 2016-02-24: qty 5

## 2016-02-24 MED ORDER — DIPHENHYDRAMINE HCL 50 MG/ML IJ SOLN
50.0000 mg | Freq: Once | INTRAMUSCULAR | Status: AC
  Administered 2016-02-24: 50 mg via INTRAVENOUS
  Filled 2016-02-24: qty 1

## 2016-02-24 MED ORDER — SODIUM CHLORIDE 0.9 % IV SOLN
Freq: Once | INTRAVENOUS | Status: AC
  Administered 2016-02-24: 12:00:00 via INTRAVENOUS

## 2016-02-24 NOTE — Assessment & Plan Note (Signed)
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  Starting systemic chemotherapy with weekly Cisplatin/Paclitaxel on 12/22/2015.  Complicated by illicit drug abuse.  Oncology history is updated.  Pre-treatment labs today: CBC diff, CMET, Magnesium.  I personally reviewed and went over laboratory results with the patient.  The results are noted within this dictation.    Hypomagnesemia is noted and 4 gram of IV magnesium sulfate is ordered.  She continues to tolerate therapy well without any patient reported complaints.    Weight is up 2 lbs compared to last week.  She is scheduled for imaging tomorrow: CT CAP and neck.  This has been rescheduled on 2 occassions due to patient preference/no-show.  Based upon imaging, we will refer to XRT.  She has been resistant to XRT recently and therefore we had a discussion about her goals.  She is advised that without XRT, we need to discuss transitioning care to palliative/comfort care with the aid of Hospice.  She is educated that her disease is not curable without XRT.  She now wishes to proceed with XRT based upon her imaging results tomorrow.  She has tolerated therapy well and her compliance has been relatively stable..  She has only missed 1 treatment appointment as a result of her G-tube being addressed by IR.  She otherwise has been on time for all of her treatment appointments.    Return as scheduled for IVF .  I have refilled her pain medication.  The [place] Controlled Substance Reporting System is reviewed.  Return in 1 week for treatment and follow-up appointment.

## 2016-02-24 NOTE — Progress Notes (Signed)
[facility], MD [address] [place] [address]  Malignant neoplasm of tonsillar fossa ([place]) - Plan: HYDROcodone-acetaminophen (NORCO) 5-325 MG tablet  CURRENT THERAPY: Weekly Cisplatin/Paclitaxel beginning on 12/22/2015  INTERVAL HISTORY: Molly Campbell 55 y.o. 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  Starting systemic chemotherapy with weekly Cisplatin/Paclitaxel on 12/22/2015.    Malignant neoplasm of tonsillar fossa ([place])   10/05/2015 Imaging    CT imaging [facility], bilateral palatine tonsil enlargement with asymmetric abnormality of R tonsil and soft palate, possible R cervical adenopathy      10/25/2015 Procedure    Direct laryngoscopy and biopsyof right tonsillar mass.      10/27/2015 Pathology Results    invasive squamous cell carcinoma, p16 NEGATIVE      11/10/2015 PET scan    Bilateral palatine tonsil enlargement and hypermetabolism, with asymmetric hypermetabolism in the right palatine tonsil, consistent with primary malignancy in this location. Bilateral mildly enlarged hypermetabolic level 2 neck lymph nodes.      11/18/2015 Procedure    IR Gastrostomy Tube, fluoroscopic 20 French Pull through Gastrostomy      11/27/2015 Procedure    CT-guided core biopsy performed of the bilobed shaped soft tissue mass anterior to the proximal stomach.      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      12/07/2015 Imaging    NM Myocar multi w spect- Nuclear stress EF: 70%.      12/22/2015 -  Chemotherapy    Cisplatin/Paclitaxel weekly       She is fearful of XRT and therefore we discussed this treatment modality and its importance with her cancer care.  We reviewed some of the side effects related to XRT including, but not limited to fatigue/tiredness, difficulty swallowing, nausea/vomiting,  erythema of skin, pain.  She is advised that these issues resolve following treatment.  Without XRT, her disease is not curable.  We discussed goals of care.  She reports pain that is nonspecific.  She reports that her pain is in her legs and arms.  She reports that the pain is present now.    Review of Systems  Constitutional: Negative for chills, fever and weight loss (weight is up ~ 2 lbs).  HENT: Negative.  Negative for sore throat.   Eyes: Negative.   Respiratory: Negative.  Negative for cough.   Cardiovascular: Negative.  Negative for chest pain.  Gastrointestinal: Negative.  Negative for abdominal pain, nausea and vomiting.  Genitourinary: Negative.   Musculoskeletal: Negative.   Skin: Negative.   Neurological: Negative.  Negative for weakness.  Endo/Heme/Allergies: Negative.   Psychiatric/Behavioral: Negative.     Past Medical History:  Diagnosis Date  . Alcohol abuse   . Alcoholic hepatitis [id number]  . Anemia   . Anxiety disorder   . Asthma   . Auditory hallucinations   . Cancer (HCC)    cervical  . Chronic back pain   . Chronic bronchitis with acute exacerbation ([place])   . Cocaine abuse   . Constipation   . COPD (chronic obstructive pulmonary disease) ([place])   . Depression   . Dysrhythmia 2012   hx AF in er-cocaine-converted back NSR  . Emphysema   . GERD (gastroesophageal reflux disease)   . Headache   . History of cervical cancer 09/26/2013  . Hypertension   . Malignant neoplasm of tonsillar  fossa ([place]) 11/12/2015  . Pneumonia    hx  . Poor circulation   . Schizophrenia ([place])   . Scoliosis   . Tobacco abuse     Past Surgical History:  Procedure Laterality Date  . ABDOMINAL HYSTERECTOMY    . BREAST BIOPSY  2005   lt bx  . COLONOSCOPY  05/07/2002   Dr.Demason- large approximately 1.5cm polyp pedunculated aproximately 10cm from the anal verge bx= adenomatous polyp  . DIRECT LARYNGOSCOPY N/A 10/25/2015   Procedure: DIRECT LARYNGOSCOPY WITH BIOPSY OF ORAL  PHARYNGEAL LESION;  Surgeon: Leta Baptist, MD;  Location: [facility];  Service: ENT;  Laterality: N/A;  DIRECT LARYNGOSCOPY WITH BIOPSY OF ORAL PHARYNGEAL LESION  . ESOPHAGOGASTRODUODENOSCOPY  06/02/2002   Dr. Anthony Sar- mild distal esophagitis w/o stricturing or ulceration. stomach and pylorus are normal. inflammatory changes of proximal duldenum in the first portion.   . ESOPHAGOGASTRODUODENOSCOPY (EGD) WITH PROPOFOL N/A 11/23/2015   Procedure: ESOPHAGOGASTRODUODENOSCOPY (EGD) WITH PROPOFOL;  Surgeon: Danie Binder, MD;  [facility];  Service: Endoscopy;  Laterality: N/A;  1245  . FRACTURE SURGERY  right wrist   [place]  . MULTIPLE EXTRACTIONS WITH ALVEOLOPLASTY N/A 12/13/2015   Procedure: Extraction of tooth #'s 2,3,4,5,6,12,13,14,15,17,and 18 with alveoloplasty, bilateral maxillary tuberosity reductions, and gross debridement of remaining teeth;  Surgeon: Lenn Cal, DDS;  Location: [facility];  Service: Oral Surgery;  Laterality: N/A;  . PEG PLACEMENT  11/18/15  . PORTACATH PLACEMENT Right 11/18/15    Family History  Problem Relation Age of Onset  . Alzheimer's disease Mother   . Other Father     "he was beat to death"  . Heart disease Sister   . Gout Sister   . COPD Sister   . Seizures Sister   . Diabetes Sister   . Diabetes Brother   . Cancer Paternal Grandmother     not sure what kind  . Seizures Sister   . Other Sister     "bones are messed up"  . Hypertension Brother   . Heart attack Brother   . Other Brother     MVA  . Diabetes Brother   . Other Brother     MVA  . Alcohol abuse Daughter   . Other Son     motorcycle accident  . Heart failure Other     Social History   Social History  . Marital status: Widowed    Spouse name: N/A  . Number of children: 3  . Years of education: N/A   Social History Main Topics  . Smoking status: Current Every Day Smoker    Packs/day: 1.00    Years: 48.00    Types: Cigarettes  . Smokeless tobacco:  Former Systems developer    Types: Snuff     Comment: One pack a day  . Alcohol use 0.0 oz/week     Comment: 40 oz a day  . Drug use:     Types: Cocaine, Marijuana     Comment: last used on Friday 8/18  . Sexual activity: Not Currently    Birth control/ protection: Surgical   Other Topics Concern  . None   Social History Narrative  . None     PHYSICAL EXAMINATION  ECOG PERFORMANCE STATUS: 1 - Symptomatic but completely ambulatory  There were no vitals filed for this visit.  Vitals - 1 value per visit [id number]  SYSTOLIC [id number]  DIASTOLIC 89  Pulse 93  Temperature 98.3  Respirations 18  Weight (lb)  105.6    GENERAL:alert, no distress, well developed, comfortable, cooperative, smiling and in chemo-bed, accompanied by friend SKIN: skin color, texture, turgor are normal, no rashes or significant lesions HEAD: Normocephalic, No masses, lesions, tenderness or abnormalities EYES: normal, EOMI, Conjunctiva are pink and non-injected EARS: External ears normal OROPHARYNX:lips, buccal mucosa, and tongue normal and mucous membranes are moist  NECK: supple, trachea midline LYMPH:  no palpable lymphadenopathy BREAST:not examined LUNGS: clear to auscultation  HEART: regular rate & rhythm ABDOMEN:abdomen soft and normal bowel sounds BACK: Back symmetric, no curvature. EXTREMITIES:less then 2 second capillary refill, no joint deformities, effusion, or inflammation, no skin discoloration, no cyanosis  NEURO: alert & oriented x 3 with fluent speech, no focal motor/sensory deficits, gait normal  LABORATORY DATA: CBC    Component Value Date/Time   WBC 4.6 02/24/2016 0914   RBC 3.04 (L) 02/24/2016 0914   HGB 10.7 (L) 02/24/2016 0914   HCT 30.4 (L) 02/24/2016 0914   PLT 159 02/24/2016 0914   MCV 100.0 02/24/2016 0914   MCH 35.2 (H) 02/24/2016 0914   MCHC 35.2 02/24/2016 0914   RDW 16.5 (H) 02/24/2016 0914   LYMPHSABS 2.6 02/24/2016 0914   MONOABS 0.3 02/24/2016 0914   EOSABS 0.1 02/24/2016  0914   BASOSABS 0.0 02/24/2016 0914      Chemistry      Component Value Date/Time   NA 135 02/24/2016 0914   K 3.6 02/24/2016 0914   CL 101 02/24/2016 0914   CO2 26 02/24/2016 0914   BUN 7 02/24/2016 0914   CREATININE 0.58 02/24/2016 0914   GLU 99 09/17/2015      Component Value Date/Time   CALCIUM 8.5 (L) 02/24/2016 0914   ALKPHOS 52 02/24/2016 0914   AST 21 02/24/2016 0914   ALT 15 02/24/2016 0914   BILITOT 0.8 02/24/2016 0914        PENDING LABS:   RADIOGRAPHIC STUDIES:  US Arterial Seg Single  Result Date: 02/14/2016 CLINICAL DATA:  Bilateral lower extremity pain. EXAM: NONINVASIVE PHYSIOLOGIC VASCULAR STUDY OF BILATERAL LOWER EXTREMITIES TECHNIQUE: Evaluation of both lower extremities were performed at rest, including calculation of ankle-brachial indices with single level Doppler, pressure and pulse volume recording. COMPARISON:  None. FINDINGS: Right ABI:  1.03 Left ABI:  1.05 Right Lower Extremity: Normal triphasic Doppler waveforms in the right ankle. Left Lower Extremity: Normal triphasic Doppler waveforms in the left ankle. IMPRESSION: Normal lower extremity exam. Electronically Signed   By: Markus Daft M.D.   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  Starting systemic chemotherapy with weekly Cisplatin/Paclitaxel on 12/22/2015.  Complicated by illicit drug abuse.  Oncology history is updated.  Pre-treatment labs today: CBC diff, CMET, Magnesium.  I personally reviewed and went over laboratory results with the patient.  The results are noted within this dictation.    Hypomagnesemia is noted and 4 gram of IV magnesium sulfate is ordered.  She continues to tolerate therapy well without any patient reported complaints.    Weight is up 2 lbs compared to last week.  She is scheduled for imaging tomorrow: CT CAP and neck.  This  has been rescheduled on 2 occassions due to patient preference/no-show.  Based upon imaging, we will refer to XRT.  She has been resistant to XRT recently and therefore we had a discussion about her goals.  She is advised  that without XRT, we need to discuss transitioning care to palliative/comfort care with the aid of Hospice.  She is educated that her disease is not curable without XRT.  She now wishes to proceed with XRT based upon her imaging results tomorrow.  She has tolerated therapy well and her compliance has been relatively stable..  She has only missed 1 treatment appointment as a result of her G-tube being addressed by IR.  She otherwise has been on time for all of her treatment appointments.    Return as scheduled for IVF .  I have refilled her pain medication.  [place] Controlled Substance Reporting System is reviewed.  Return in 1 week for treatment and follow-up appointment.   ORDERS PLACED FOR THIS ENCOUNTER: No orders of the defined types were placed in this encounter.   MEDICATIONS PRESCRIBED THIS ENCOUNTER: Meds ordered this encounter  Medications  . HYDROcodone-acetaminophen (NORCO) 5-325 MG tablet    Sig: Take 1-2 tablets by mouth every 4 (four) hours as needed for moderate pain or severe pain.    Dispense:  30 tablet    Refill:  0    Order Specific Question:   Supervising Provider    Answer:   Patrici Ranks [id number]    THERAPY PLAN:  Continue treatment with supportive care as outlined above.  All questions were answered. The patient knows to call the clinic with any problems, questions or concerns. We can certainly see the patient much sooner if necessary.  Patient and plan discussed with Dr. Ancil Linsey and she is in agreement with the aforementioned.   This note is electronically signed by: Robynn Pane, PA-C 02/24/2016 2:02 PM

## 2016-02-24 NOTE — Patient Instructions (Signed)
[place] at [facility] Discharge Instructions  RECOMMENDATIONS MADE BY THE CONSULTANT AND ANY TEST RESULTS WILL BE SENT TO YOUR REFERRING PHYSICIAN.  You were seen by Gershon Mussel today. Chemo weekly  CT scans as scheduled  Return for follow up in 2 weeks  Thank you for choosing [facility] at [facility] to provide your oncology and hematology care.  To afford each patient quality time with our provider, please arrive at least 15 minutes before your scheduled appointment time.   Beginning January 23rd 2017 lab work for the [facility] will be done in the  Main lab at [facility] on 1st floor. If you have a lab appointment with the [facility] please come in thru the  Main Entrance and check in at the main information desk  You need to re-schedule your appointment should you arrive 10 or more minutes late.  We strive to give you quality time with our providers, and arriving late affects you and other patients whose appointments are after yours.  Also, if you no show three or more times for appointments you may be dismissed from the clinic at the providers discretion.     Again, thank you for choosing [facility].  Our hope is that these requests will decrease the amount of time that you wait before being seen by our physicians.       _____________________________________________________________  Should you have questions after your visit to [facility], please contact our office at [phone] between the hours of 8:30 a.m. and 4:30 p.m.  Voicemails left after 4:30 p.m. will not be returned until the following business day.  For prescription refill requests, have your pharmacy contact our office.         Resources For Cancer Patients and their Caregivers ? American Cancer Society: Can assist with transportation, wigs, general needs, runs Look Good Feel Better.        (416)496-5197 ? Cancer Care: Provides  financial assistance, online support groups, medication/co-pay assistance.  1-800-813-HOPE (940)496-5262) ? [facility] Assists [place] cancer patients and their families through emotional , educational and financial support.  (928)518-6181 ? [place] DSS Where to apply for food stamps, Medicaid and utility assistance. 204-247-3956 ? RCATS: Transportation to medical appointments. 765-541-8178 ? Social Security Administration: May apply for disability if have a Stage IV cancer. 346-472-3182 302-199-8975 ? [employer], Disability and Transit Services: Assists with nutrition, care and transit needs. [place] Support Programs: @10RELATIVEDAYS @ > Cancer Support Group  2nd Tuesday of the month 1pm-2pm, Journey Room  > Creative Journey  3rd Tuesday of the month 1130am-1pm, Journey Room  > Look Good Feel Better  1st Wednesday of the month 10am-12 noon, Journey Room (Call [place] to register [phone])

## 2016-02-24 NOTE — Patient Instructions (Signed)
[facility] Discharge Instructions for Patients Receiving Chemotherapy   Beginning January 23rd 2017 lab work for the [facility] will be done in the  Main lab at [facility] on 1st floor. If you have a lab appointment with the [facility] please come in thru the  Main Entrance and check in at the main information desk   Today you received the following chemotherapy agents Cisplatin and Taxol.  To help prevent nausea and vomiting after your treatment, we encourage you to take your nausea medication as instructed.   If you develop nausea and vomiting, or diarrhea that is not controlled by your medication, call the clinic.  The clinic phone number is [phone]. Office hours are Monday-Friday 8:30am-5:00pm.  BELOW ARE SYMPTOMS THAT SHOULD BE REPORTED IMMEDIATELY:  *FEVER GREATER THAN 101.0 F  *CHILLS WITH OR WITHOUT FEVER  NAUSEA AND VOMITING THAT IS NOT CONTROLLED WITH YOUR NAUSEA MEDICATION  *UNUSUAL SHORTNESS OF BREATH  *UNUSUAL BRUISING OR BLEEDING  TENDERNESS IN MOUTH AND THROAT WITH OR WITHOUT PRESENCE OF ULCERS  *URINARY PROBLEMS  *BOWEL PROBLEMS  UNUSUAL RASH Items with * indicate a potential emergency and should be followed up as soon as possible. If you have an emergency after office hours please contact your primary care physician or go to the nearest emergency department.  Please call the clinic during office hours if you have any questions or concerns.   You may also contact the Patient Navigator at [phone] should you have any questions or need assistance in obtaining follow up care.      Resources For Cancer Patients and their Caregivers ? American Cancer Society: Can assist with transportation, wigs, general needs, runs Look Good Feel Better.        ([phone] [phone] ? Cancer Care: Provides financial assistance, online support groups, medication/co-pay assistance.  1-800-813-HOPE 234-277-8702) ? [facility] Assists [place] cancer patients and their families through emotional , educational and financial support.  [phone] ? [place] DSS Where to apply for food stamps, Medicaid and utility assistance. (551) 291-0737 ? RCATS: Transportation to medical appointments. (612)809-8292 ? Social Security Administration: May apply for disability if have a Stage IV cancer. 419-823-9405 617-592-4335 ? [employer], Disability and Transit Services: Assists with nutrition, care and transit needs. [phone]

## 2016-02-24 NOTE — Progress Notes (Signed)
Tolerated chemo well. Stable and ambulatory on discharge home to self. 

## 2016-02-25 ENCOUNTER — Ambulatory Visit (HOSPITAL_COMMUNITY)
Admission: RE | Admit: 2016-02-25 | Discharge: 2016-02-25 | Disposition: A | Discharge status: Home / Self Care | Payer: Medicaid Other | Source: Ambulatory Visit | Attending: Oncology | Admitting: Oncology

## 2016-02-25 DIAGNOSIS — R918 Other nonspecific abnormal finding of lung field: Secondary | ICD-10-CM

## 2016-02-25 DIAGNOSIS — I2510 Atherosclerotic heart disease of native coronary artery without angina pectoris: Secondary | ICD-10-CM | POA: Insufficient documentation

## 2016-02-25 DIAGNOSIS — C090 Malignant neoplasm of tonsillar fossa: Secondary | ICD-10-CM

## 2016-02-25 DIAGNOSIS — I700 Atherosclerosis of aorta: Secondary | ICD-10-CM | POA: Insufficient documentation

## 2016-02-25 DIAGNOSIS — K319 Disease of stomach and duodenum, unspecified: Secondary | ICD-10-CM | POA: Diagnosis not present

## 2016-02-25 DIAGNOSIS — K3189 Other diseases of stomach and duodenum: Secondary | ICD-10-CM

## 2016-02-25 MED ORDER — IOPAMIDOL (ISOVUE-300) INJECTION 61%
100.0000 mL | Freq: Once | INTRAVENOUS | Status: AC | PRN
  Administered 2016-02-25: 100 mL via INTRAVENOUS

## 2016-02-25 MED ORDER — HEPARIN SOD (PORK) LOCK FLUSH 100 UNIT/ML IV SOLN
INTRAVENOUS | Status: AC
  Administered 2016-02-25: 500 [IU]
  Filled 2016-02-25: qty 5

## 2016-02-25 MED ORDER — HEPARIN SOD (PORK) LOCK FLUSH 100 UNIT/ML IV SOLN
INTRAVENOUS | Status: AC
  Filled 2016-02-25: qty 5

## 2016-03-03 ENCOUNTER — Encounter (HOSPITAL_COMMUNITY): Payer: Self-pay

## 2016-03-03 ENCOUNTER — Other Ambulatory Visit (HOSPITAL_COMMUNITY): Payer: Self-pay | Admitting: Oncology

## 2016-03-03 ENCOUNTER — Encounter (HOSPITAL_BASED_OUTPATIENT_CLINIC_OR_DEPARTMENT_OTHER): Payer: Medicaid Other

## 2016-03-03 VITALS — BP 138/77 | HR 77 | Temp 98.3°F | Resp 18 | Wt 104.2 lb

## 2016-03-03 DIAGNOSIS — E876 Hypokalemia: Secondary | ICD-10-CM

## 2016-03-03 DIAGNOSIS — F1410 Cocaine abuse, uncomplicated: Secondary | ICD-10-CM | POA: Diagnosis not present

## 2016-03-03 DIAGNOSIS — C090 Malignant neoplasm of tonsillar fossa: Secondary | ICD-10-CM

## 2016-03-03 DIAGNOSIS — Z5111 Encounter for antineoplastic chemotherapy: Secondary | ICD-10-CM | POA: Diagnosis present

## 2016-03-03 LAB — CBC WITH DIFFERENTIAL/PLATELET
BASOS PCT: 1 %
Basophils Absolute: 0 10*3/uL (ref 0.0–0.1)
EOS ABS: 0 10*3/uL (ref 0.0–0.7)
Eosinophils Relative: 1 %
HCT: 30.1 % — ABNORMAL LOW (ref 36.0–46.0)
HEMOGLOBIN: 10.9 g/dL — AB (ref 12.0–15.0)
LYMPHS ABS: 1.9 10*3/uL (ref 0.7–4.0)
Lymphocytes Relative: 43 %
MCH: 36.2 pg — ABNORMAL HIGH (ref 26.0–34.0)
MCHC: 36.2 g/dL — ABNORMAL HIGH (ref 30.0–36.0)
MCV: 100 fL (ref 78.0–100.0)
Monocytes Absolute: 0.5 10*3/uL (ref 0.1–1.0)
Monocytes Relative: 11 %
NEUTROS ABS: 1.9 10*3/uL (ref 1.7–7.7)
NEUTROS PCT: 44 %
Platelets: 144 10*3/uL — ABNORMAL LOW (ref 150–400)
RBC: 3.01 MIL/uL — AB (ref 3.87–5.11)
RDW: 16 % — ABNORMAL HIGH (ref 11.5–15.5)
WBC: 4.3 10*3/uL (ref 4.0–10.5)

## 2016-03-03 LAB — MAGNESIUM: Magnesium: 1.1 mg/dL — ABNORMAL LOW (ref 1.7–2.4)

## 2016-03-03 LAB — COMPREHENSIVE METABOLIC PANEL
ALBUMIN: 3.6 g/dL (ref 3.5–5.0)
ALK PHOS: 69 U/L (ref 38–126)
ALT: 12 U/L — AB (ref 14–54)
AST: 16 U/L (ref 15–41)
Anion gap: 9 (ref 5–15)
BUN: 6 mg/dL (ref 6–20)
CALCIUM: 8.4 mg/dL — AB (ref 8.9–10.3)
CO2: 23 mmol/L (ref 22–32)
CREATININE: 0.71 mg/dL (ref 0.44–1.00)
Chloride: 99 mmol/L — ABNORMAL LOW (ref 101–111)
GFR calc Af Amer: >60 mL/min (ref >60)
GFR calc non Af Amer: >60 mL/min (ref >60)
GLUCOSE: 99 mg/dL (ref 65–99)
Potassium: 3 mmol/L — ABNORMAL LOW (ref 3.5–5.1)
SODIUM: 131 mmol/L — AB (ref 135–145)
Total Bilirubin: 0.2 mg/dL — ABNORMAL LOW (ref 0.3–1.2)
Total Protein: 6.8 g/dL (ref 6.5–8.1)

## 2016-03-03 MED ORDER — HEPARIN SOD (PORK) LOCK FLUSH 100 UNIT/ML IV SOLN
500.0000 [IU] | Freq: Once | INTRAVENOUS | Status: AC | PRN
  Administered 2016-03-03: 500 [IU]
  Filled 2016-03-03: qty 5

## 2016-03-03 MED ORDER — POTASSIUM CHLORIDE 2 MEQ/ML IV SOLN
Freq: Once | INTRAVENOUS | Status: AC
  Administered 2016-03-03: 10:00:00 via INTRAVENOUS
  Filled 2016-03-03: qty 10

## 2016-03-03 MED ORDER — SODIUM CHLORIDE 0.9 % IV SOLN
Freq: Once | INTRAVENOUS | Status: AC
  Administered 2016-03-03: 12:00:00 via INTRAVENOUS
  Filled 2016-03-03: qty 5

## 2016-03-03 MED ORDER — PACLITAXEL CHEMO INJECTION 300 MG/50ML
80.0000 mg/m2 | Freq: Once | INTRAVENOUS | Status: AC
  Administered 2016-03-03: 120 mg via INTRAVENOUS
  Filled 2016-03-03: qty 20

## 2016-03-03 MED ORDER — DIPHENHYDRAMINE HCL 50 MG/ML IJ SOLN
50.0000 mg | Freq: Once | INTRAMUSCULAR | Status: AC
  Administered 2016-03-03: 50 mg via INTRAVENOUS
  Filled 2016-03-03: qty 1

## 2016-03-03 MED ORDER — POTASSIUM CHLORIDE CRYS ER 20 MEQ PO TBCR
60.0000 meq | EXTENDED_RELEASE_TABLET | Freq: Once | ORAL | Status: AC
  Administered 2016-03-03: 60 meq via ORAL
  Filled 2016-03-03: qty 3

## 2016-03-03 MED ORDER — AZITHROMYCIN 250 MG PO TABS: ORAL_TABLET | ORAL | 0 refills | Status: DC

## 2016-03-03 MED ORDER — SODIUM CHLORIDE 0.9% FLUSH: 10.0000 mL | INTRAVENOUS | Status: DC | PRN

## 2016-03-03 MED ORDER — ACETAMINOPHEN 325 MG PO TABS
650.0000 mg | ORAL_TABLET | Freq: Once | ORAL | Status: AC
  Administered 2016-03-03: 650 mg via ORAL
  Filled 2016-03-03: qty 2

## 2016-03-03 MED ORDER — FAMOTIDINE IN NACL 20-0.9 MG/50ML-% IV SOLN
20.0000 mg | Freq: Once | INTRAVENOUS | Status: AC
  Administered 2016-03-03: 20 mg via INTRAVENOUS
  Filled 2016-03-03: qty 50

## 2016-03-03 MED ORDER — SODIUM CHLORIDE 0.9 % IV SOLN
4.0000 g | Freq: Once | INTRAVENOUS | Status: AC
  Administered 2016-03-03: 4 g via INTRAVENOUS
  Filled 2016-03-03: qty 8

## 2016-03-03 MED ORDER — PALONOSETRON HCL INJECTION 0.25 MG/5ML
0.2500 mg | Freq: Once | INTRAVENOUS | Status: AC
  Administered 2016-03-03: 0.25 mg via INTRAVENOUS
  Filled 2016-03-03: qty 5

## 2016-03-03 MED ORDER — SODIUM CHLORIDE 0.9 % IV SOLN
40.0000 mg/m2 | Freq: Once | INTRAVENOUS | Status: AC
  Administered 2016-03-03: 59 mg via INTRAVENOUS
  Filled 2016-03-03: qty 50

## 2016-03-03 MED ORDER — POTASSIUM CHLORIDE CRYS ER 20 MEQ PO TBCR: 20.0000 meq | EXTENDED_RELEASE_TABLET | Freq: Two times a day (BID) | ORAL | 1 refills | Status: DC

## 2016-03-03 NOTE — Progress Notes (Signed)
1030:  Voided 225 ml clear yellow urine  Tolerated tx w/o adverse reaction.  Alert, in no distress.  VSS.  Discharged ambulatory in c/o family.

## 2016-03-03 NOTE — Patient Instructions (Signed)
[facility] Discharge Instructions for Patients Receiving Chemotherapy   Beginning January 23rd 2017 lab work for the [facility] will be done in the  Main lab at [facility] on 1st floor. If you have a lab appointment with the [facility] please come in thru the  Main Entrance and check in at the main information desk   Today you received the following chemotherapy agents:  Taxol and Cisplatin You have also been prescribed a Z-pak and potassium; take these as directed.   If you develop nausea and vomiting, or diarrhea that is not controlled by your medication, call the clinic.  The clinic phone number is [phone]. Office hours are Monday-Friday 8:30am-5:00pm.  BELOW ARE SYMPTOMS THAT SHOULD BE REPORTED IMMEDIATELY:  *FEVER GREATER THAN 101.0 F  *CHILLS WITH OR WITHOUT FEVER  NAUSEA AND VOMITING THAT IS NOT CONTROLLED WITH YOUR NAUSEA MEDICATION  *UNUSUAL SHORTNESS OF BREATH  *UNUSUAL BRUISING OR BLEEDING  TENDERNESS IN MOUTH AND THROAT WITH OR WITHOUT PRESENCE OF ULCERS  *URINARY PROBLEMS  *BOWEL PROBLEMS  UNUSUAL RASH Items with * indicate a potential emergency and should be followed up as soon as possible. If you have an emergency after office hours please contact your primary care physician or go to the nearest emergency department.  Please call the clinic during office hours if you have any questions or concerns.   You may also contact the Patient Navigator at [phone] should you have any questions or need assistance in obtaining follow up care.      Resources For Cancer Patients and their Caregivers ? American Cancer Society: Can assist with transportation, wigs, general needs, runs Look Good Feel Better.        220-330-5515 ? Cancer Care: Provides financial assistance, online support groups, medication/co-pay assistance.  1-800-813-HOPE 5177658717) ? [facility] Assists [place] cancer patients and  their families through emotional , educational and financial support.  (540)719-6746 ? [place] DSS Where to apply for food stamps, Medicaid and utility assistance. (505) 437-2871 ? RCATS: Transportation to medical appointments. 220-323-3896 ? Social Security Administration: May apply for disability if have a Stage IV cancer. 817-067-3794 310-054-5653 ? [employer], Disability and Transit Services: Assists with nutrition, care and transit needs. [phone]

## 2016-03-05 NOTE — Progress Notes (Signed)
Patient on plan of care prior to pathways. 

## 2016-03-08 ENCOUNTER — Telehealth (HOSPITAL_COMMUNITY): Payer: Self-pay | Admitting: *Deleted

## 2016-03-08 ENCOUNTER — Other Ambulatory Visit (HOSPITAL_COMMUNITY): Payer: Self-pay | Admitting: Oncology

## 2016-03-08 DIAGNOSIS — C090 Malignant neoplasm of tonsillar fossa: Secondary | ICD-10-CM

## 2016-03-08 MED ORDER — HYDROCODONE-ACETAMINOPHEN 5-325 MG PO TABS: 1.0000 | ORAL_TABLET | ORAL | 0 refills | Status: DC | PRN

## 2016-03-09 ENCOUNTER — Ambulatory Visit (HOSPITAL_COMMUNITY): Payer: Self-pay

## 2016-03-09 ENCOUNTER — Ambulatory Visit (HOSPITAL_COMMUNITY): Payer: Self-pay | Admitting: Hematology & Oncology

## 2016-03-09 MED ORDER — POTASSIUM CHLORIDE 2 MEQ/ML IV SOLN
Freq: Once | INTRAVENOUS | Status: DC
  Filled 2016-03-09: qty 10

## 2016-03-14 ENCOUNTER — Encounter (HOSPITAL_COMMUNITY): Payer: Self-pay | Admitting: Lab

## 2016-03-14 ENCOUNTER — Encounter: Payer: Self-pay | Admitting: Dietician

## 2016-03-14 ENCOUNTER — Encounter (HOSPITAL_COMMUNITY): Payer: Medicaid Other | Attending: Oncology

## 2016-03-14 ENCOUNTER — Encounter (HOSPITAL_BASED_OUTPATIENT_CLINIC_OR_DEPARTMENT_OTHER): Payer: Medicaid Other | Admitting: Oncology

## 2016-03-14 ENCOUNTER — Encounter (HOSPITAL_COMMUNITY): Payer: Self-pay

## 2016-03-14 VITALS — BP 139/76 | HR 72 | Temp 98.5°F | Resp 18 | Wt 102.8 lb

## 2016-03-14 DIAGNOSIS — D473 Essential (hemorrhagic) thrombocythemia: Secondary | ICD-10-CM | POA: Diagnosis present

## 2016-03-14 DIAGNOSIS — E876 Hypokalemia: Secondary | ICD-10-CM | POA: Insufficient documentation

## 2016-03-14 DIAGNOSIS — C090 Malignant neoplasm of tonsillar fossa: Secondary | ICD-10-CM

## 2016-03-14 DIAGNOSIS — F1410 Cocaine abuse, uncomplicated: Secondary | ICD-10-CM | POA: Diagnosis not present

## 2016-03-14 DIAGNOSIS — F329 Major depressive disorder, single episode, unspecified: Secondary | ICD-10-CM | POA: Diagnosis not present

## 2016-03-14 DIAGNOSIS — D649 Anemia, unspecified: Secondary | ICD-10-CM

## 2016-03-14 DIAGNOSIS — Z5111 Encounter for antineoplastic chemotherapy: Secondary | ICD-10-CM | POA: Diagnosis not present

## 2016-03-14 LAB — COMPREHENSIVE METABOLIC PANEL
ALBUMIN: 3.5 g/dL (ref 3.5–5.0)
ALK PHOS: 69 U/L (ref 38–126)
ALT: 12 U/L — AB (ref 14–54)
AST: 20 U/L (ref 15–41)
Anion gap: 9 (ref 5–15)
BILIRUBIN TOTAL: 0.3 mg/dL (ref 0.3–1.2)
BUN: 6 mg/dL (ref 6–20)
CO2: 24 mmol/L (ref 22–32)
CREATININE: 0.67 mg/dL (ref 0.44–1.00)
Calcium: 8.4 mg/dL — ABNORMAL LOW (ref 8.9–10.3)
Chloride: 99 mmol/L — ABNORMAL LOW (ref 101–111)
GFR calc Af Amer: >60 mL/min (ref >60)
GFR calc non Af Amer: >60 mL/min (ref >60)
GLUCOSE: 120 mg/dL — AB (ref 65–99)
POTASSIUM: 3.2 mmol/L — AB (ref 3.5–5.1)
Sodium: 132 mmol/L — ABNORMAL LOW (ref 135–145)
Total Protein: 6.4 g/dL — ABNORMAL LOW (ref 6.5–8.1)

## 2016-03-14 LAB — CBC WITH DIFFERENTIAL/PLATELET
BASOS ABS: 0 10*3/uL (ref 0.0–0.1)
BASOS PCT: 0 %
Eosinophils Absolute: 0 10*3/uL (ref 0.0–0.7)
Eosinophils Relative: 0 %
HEMATOCRIT: 25.4 % — AB (ref 36.0–46.0)
HEMOGLOBIN: 8.9 g/dL — AB (ref 12.0–15.0)
Lymphocytes Relative: 57 %
Lymphs Abs: 2.4 10*3/uL (ref 0.7–4.0)
MCH: 36.6 pg — ABNORMAL HIGH (ref 26.0–34.0)
MCHC: 35 g/dL (ref 30.0–36.0)
MCV: 104.5 fL — ABNORMAL HIGH (ref 78.0–100.0)
Monocytes Absolute: 0.5 10*3/uL (ref 0.1–1.0)
Monocytes Relative: 13 %
NEUTROS ABS: 1.2 10*3/uL — AB (ref 1.7–7.7)
NEUTROS PCT: 30 %
Platelets: 159 10*3/uL (ref 150–400)
RBC: 2.43 MIL/uL — AB (ref 3.87–5.11)
RDW: 16.7 % — ABNORMAL HIGH (ref 11.5–15.5)
WBC: 4.2 10*3/uL (ref 4.0–10.5)

## 2016-03-14 LAB — IRON AND TIBC
IRON: 38 ug/dL (ref 28–170)
Saturation Ratios: 14 % (ref 10.4–31.8)
TIBC: 273 ug/dL (ref 250–450)
UIBC: 235 ug/dL

## 2016-03-14 LAB — FOLATE: FOLATE: 15.9 ng/mL (ref >5.9)

## 2016-03-14 LAB — VITAMIN B12: Vitamin B-12: 355 pg/mL (ref 180–914)

## 2016-03-14 LAB — FERRITIN: Ferritin: 162 ng/mL (ref 11–307)

## 2016-03-14 LAB — PREPARE RBC (CROSSMATCH)

## 2016-03-14 MED ORDER — ACETAMINOPHEN 325 MG PO TABS
650.0000 mg | ORAL_TABLET | Freq: Once | ORAL | Status: AC
  Administered 2016-03-14: 650 mg via ORAL
  Filled 2016-03-14: qty 2

## 2016-03-14 MED ORDER — HYDROCODONE-ACETAMINOPHEN 5-325 MG PO TABS: 1.0000 | ORAL_TABLET | ORAL | 0 refills | Status: DC | PRN

## 2016-03-14 MED ORDER — SODIUM CHLORIDE 0.9% FLUSH
10.0000 mL | INTRAVENOUS | Status: DC | PRN
  Administered 2016-03-14: 10 mL
  Filled 2016-03-14: qty 10

## 2016-03-14 MED ORDER — HEPARIN SOD (PORK) LOCK FLUSH 100 UNIT/ML IV SOLN
500.0000 [IU] | Freq: Once | INTRAVENOUS | Status: AC | PRN
  Administered 2016-03-14: 500 [IU]
  Filled 2016-03-14: qty 5

## 2016-03-14 MED ORDER — FAMOTIDINE IN NACL 20-0.9 MG/50ML-% IV SOLN
20.0000 mg | Freq: Once | INTRAVENOUS | Status: AC
  Administered 2016-03-14: 20 mg via INTRAVENOUS
  Filled 2016-03-14: qty 50

## 2016-03-14 MED ORDER — FOSAPREPITANT DIMEGLUMINE INJECTION 150 MG
Freq: Once | INTRAVENOUS | Status: AC
  Administered 2016-03-14: 12:00:00 via INTRAVENOUS
  Filled 2016-03-14: qty 5

## 2016-03-14 MED ORDER — POTASSIUM CHLORIDE CRYS ER 20 MEQ PO TBCR
60.0000 meq | EXTENDED_RELEASE_TABLET | Freq: Two times a day (BID) | ORAL | Status: DC
  Administered 2016-03-14: 60 meq via ORAL
  Filled 2016-03-14 (×3): qty 3

## 2016-03-14 MED ORDER — FUROSEMIDE 10 MG/ML IJ SOLN
20.0000 mg | Freq: Once | INTRAMUSCULAR | Status: AC
  Administered 2016-03-14: 20 mg via INTRAVENOUS
  Filled 2016-03-14: qty 2

## 2016-03-14 MED ORDER — PALONOSETRON HCL INJECTION 0.25 MG/5ML
0.2500 mg | Freq: Once | INTRAVENOUS | Status: AC
  Administered 2016-03-14: 0.25 mg via INTRAVENOUS
  Filled 2016-03-14: qty 5

## 2016-03-14 MED ORDER — DIPHENHYDRAMINE HCL 50 MG/ML IJ SOLN
50.0000 mg | Freq: Once | INTRAMUSCULAR | Status: AC
  Administered 2016-03-14: 50 mg via INTRAVENOUS
  Filled 2016-03-14: qty 1

## 2016-03-14 MED ORDER — DEXTROSE-NACL 5-0.45 % IV SOLN
Freq: Once | INTRAVENOUS | Status: AC
  Administered 2016-03-14: 10:00:00 via INTRAVENOUS
  Filled 2016-03-14: qty 10

## 2016-03-14 MED ORDER — PACLITAXEL CHEMO INJECTION 300 MG/50ML
80.0000 mg/m2 | Freq: Once | INTRAVENOUS | Status: AC
  Administered 2016-03-14: 120 mg via INTRAVENOUS
  Filled 2016-03-14: qty 20

## 2016-03-14 MED ORDER — SODIUM CHLORIDE 0.9 % IV SOLN
40.0000 mg/m2 | Freq: Once | INTRAVENOUS | Status: AC
  Administered 2016-03-14: 59 mg via INTRAVENOUS
  Filled 2016-03-14: qty 59

## 2016-03-14 MED ORDER — ESCITALOPRAM OXALATE 10 MG PO TABS: 10.0000 mg | ORAL_TABLET | Freq: Every day | ORAL | 1 refills | Status: DC

## 2016-03-14 MED ORDER — SODIUM CHLORIDE 0.9 % IV SOLN
Freq: Once | INTRAVENOUS | Status: AC
  Administered 2016-03-14: 12:00:00 via INTRAVENOUS

## 2016-03-14 NOTE — Assessment & Plan Note (Addendum)
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  Starting systemic chemotherapy with weekly Cisplatin/Paclitaxel on 12/22/2015.  Complicated by illicit drug abuse.  Oncology history is updated.  Pre-treatment labs today: CBC diff, CMET, Magnesium.  I personally reviewed and went over laboratory results with the patient.  The results are noted within this dictation.    Hypokalemia is noted and therefore Kdur 60 mEq is given in the clinic.  She will continue with her home [place] as well.  Given her anemia, I have ordered an anemia panel for peripheral work-up of anemia.  This will be drawn today.  I have recommended 1 unit PRBCs for her anemia as well.  I personally reviewed and went over radiographic studies with the patient.  The results are noted within this dictation.  CT CAP and neck are reviewed with the patient.  Images are stable and there are no new sites of disease.  As a result of stable scans, we will refer the patient to XRT.  She continues to tolerate therapy well without any patient reported complaints.    Weight is down 2 lbs compared to last week.  She reports issues with depression and therefore, I have prescribed Lexapro 10 mg daily.  She is educated that this does not work immediately.  We will increase her dose in 2-3 weeks if necessaary.  [place] Controlled Substance Reporting System is reviewed.  The last refill of this medication was on 02/25/2016.  I have refilled her Hydrocodone as a result.  She has tolerated therapy well and her compliance has been relatively stable.  Return as scheduled for IVF.  I note that she has run out of NS orders.  More orders are placed for Tues/Fri NS infusions.  She will receive her Tuesday fluids here in the clinic and Friday infusions will be in Short-Stay.  Return in 1 week for treatment and 2 weeks for treatment and follow-up appointment.

## 2016-03-14 NOTE — Progress Notes (Signed)
F/U with high risk H&N patient w/ PEG.   55 y/o female PMHx : Tobacco abuse (48 pack year), severe etoh abuse (6 pack or 40 oz daily), illicit drug abuse,HTN, alcoholic hepatitis, COPD,Cervical cancer,GERD, anxiety,schizophrenia,mental retardation(2014)Esophageal Dysphagia (2015)  Contacted Pt by visiting during her infusion.   Wt Readings from Last 10 Encounters:  03/14/16 102 lb 12.8 oz (46.6 kg)  03/03/16 104 lb 3.2 oz (47.3 kg)  02/24/16 105 lb 9.6 oz (47.9 kg)  02/18/16 103 lb 6.4 oz (46.9 kg)  02/09/16 106 lb (48.1 kg)  01/26/16 105 lb 6.4 oz (47.8 kg)  01/19/16 111 lb (50.3 kg)  01/07/16 110 lb (49.9 kg)  01/05/16 110 lb 3.2 oz (50 kg)  12/31/15 108 lb (49 kg)   Patient weight has decreased by 1.5 lbs in the last 10 days.   On last follow up she had revealed she had not been using her PEG for > 1 month. During that visit RD discussed why she needed to use her PEG tube and how radiation will largely decrease her PO intake. She had become extremely upset when radiation was brought up and the encounter had to end there. She was asked just to do the best she could with her tube feeding.   Pt is again difficult to understand today. Pt's PO intake sounds to be worse. At first, she was believed to say that she could only take 2-3 bites before becoming full, however towards the end of this topic she said she could eat meals fine. She had also reported some postprandial nausea, but then denied this with further questioning. When asked about frequency of other side effects/symptoms she just said "sometimes" to each of them  She said the word "fatigue" multiple times during the conversation and this is thought to be her biggest side effect.   Today, when asked if she has been using her tube, she says she has been placing 1 can through the tube. She said she was being truthful. She reports she flushes before placing the can through the tube. She then says she has been drinking 1 of the  cans of Osmolite 1.5 so she has been getting a total of 2 cans.   She asks the same question she did last follow up: why cant she be provided more of "the better tasting one" instead of the "bad tasting one" when they have similar calories/protein.   To avoid a repeat of last meeting, RD simply stated that radiation may make her PO intake worse and she will need to use her tube more. The enteral formula is more appropriate for this. She again got slightly emotional, stating "cause im gonna lose my throat".  Quickly changed subjects and gave her much praise for starting her tube feeding back up. Encouraged her to add 1 more can to her TF regimen so she would be infusing 2 and drinking 1. RD encouraged her by reiterating her goal is 5. If she could do this, she would be over halfway to goal.   She again has coffee with her. Verified she is adding extra cream/sugar to her beverage.   Again, pt is a very poor historian and often contradicts herself. More recently she has become extremely difficult to understand when she speaks. Because of these obstacles and the fact that pt lives on her own, it is essentially impossible to know exactly what pt has/has not been doing. She remains at extremely high nutrition risk.   Burtis Junes RD, LDN, CNSC  Nutrition Pager: [phone] 03/14/2016 11:13 AM

## 2016-03-14 NOTE — Patient Instructions (Signed)
[place] [facility] Discharge Instructions  RECOMMENDATIONS MADE BY THE CONSULTANT AND ANY TEST RESULTS WILL BE SENT TO YOUR REFERRING PHYSICIAN.  You were seen today by Kirby Crigler PA-C. Continue taking Kdur at home. Rx for Hydrocodone and Lexapro given. Tx next week. Follow up in 2 weeks.  Thank you for choosing [facility] to provide your oncology and hematology care.  To afford each patient quality time with our provider, please arrive at least 15 minutes before your scheduled appointment time.   Beginning January 23rd 2017 lab work for the [facility] will be done in the  Main lab at [facility] on 1st floor. If you have a lab appointment with the [facility] please come in thru the  Main Entrance and check in at the main information desk  You need to re-schedule your appointment should you arrive 10 or more minutes late.  We strive to give you quality time with our providers, and arriving late affects you and other patients whose appointments are after yours.  Also, if you no show three or more times for appointments you may be dismissed from the clinic at the providers discretion.     Again, thank you for choosing [facility].  Our hope is that these requests will decrease the amount of time that you wait before being seen by our physicians.       _____________________________________________________________  Should you have questions after your visit to [facility], please contact our office at [phone] between the hours of 8:30 a.m. and 4:30 p.m.  Voicemails left after 4:30 p.m. will not be returned until the following business day.  For prescription refill requests, have your pharmacy contact our office.         Resources For Cancer Patients and their Caregivers ? American Cancer Society: Can assist with transportation, wigs, general needs, runs Look Good Feel Better.         512-726-4463 ? Cancer Care: Provides financial assistance, online support groups, medication/co-pay assistance.  1-800-813-HOPE (904)816-3600) ? [place] Assists [place] cancer patients and their families through emotional , educational and financial support.  (571) 531-3508 ? [place] DSS Where to apply for food stamps, Medicaid and utility assistance. 986 678 3942 ? [facility]: Transportation to medical appointments. 845 683 8088 ? Social Security Administration: May apply for disability if have a Stage IV cancer. 425-154-9277 212-216-1012 ? [employer], Disability and Transit Services: Assists with nutrition, care and transit needs. [place] Support Programs: @10RELATIVEDAYS @ > Cancer Support Group  2nd Tuesday of the month 1pm-2pm, Journey Room  > Creative Journey  3rd Tuesday of the month 1130am-1pm, Journey Room  > Look Good Feel Better  1st Wednesday of the month 10am-12 noon, Journey Room (Call [facility] to register [phone])

## 2016-03-14 NOTE — Progress Notes (Unsigned)
Referral to [facility].  Records faxed on 11/7.

## 2016-03-14 NOTE — Progress Notes (Signed)
Labs reviewed by MD, ok to proceed with treatment.  One unit of blood is set up for Friday when she is scheduled for IVF.  Chemotherapy given today per orders. Patient tolerated without any problems. Vitals stable and discharged ambulatory from clinic.

## 2016-03-14 NOTE — Progress Notes (Signed)
[place], MD [place] [address]  Malignant neoplasm of tonsillar fossa ([place]) - Plan: 0.9 %  sodium chloride infusion, 0.9 %  sodium chloride infusion, 0.9 %  sodium chloride infusion, 0.9 %  sodium chloride infusion, 0.9 %  sodium chloride infusion, 0.9 %  sodium chloride infusion, HYDROcodone-acetaminophen (NORCO) 5-325 MG tablet, DISCONTINUED: sodium chloride flush (NS) 0.9 % injection 10 mL, DISCONTINUED: heparin lock flush 100 unit/mL, DISCONTINUED: 0.9 %  sodium chloride infusion, DISCONTINUED: fosaprepitant (EMEND) 150 mg, dexamethasone (DECADRON) 12 mg in sodium chloride 0.9 % 145 mL IVPB, DISCONTINUED: diphenhydrAMINE (BENADRYL) injection 50 mg, DISCONTINUED: palonosetron (ALOXI) injection 0.25 mg, DISCONTINUED: famotidine (PEPCID) IVPB 20 mg premix, DISCONTINUED: acetaminophen (TYLENOL) tablet 650 mg, DISCONTINUED: furosemide (LASIX) injection 20 mg  Anemia, unspecified type - Plan: Vitamin B12, Folate, Iron and TIBC, Ferritin, Practitioner attestation of consent, Complete patient signature process for consent form, Care order/instruction, 0.9 %  sodium chloride infusion, sodium chloride flush (NS) 0.9 % injection 10 mL, heparin lock flush 100 unit/mL, heparin lock flush 100 unit/mL, sodium chloride flush (NS) 0.9 % injection 3 mL, Type and screen, Prepare RBC, Transfuse RBC, acetaminophen (TYLENOL) tablet 650 mg, diphenhydrAMINE (BENADRYL) capsule 25 mg  Hypokalemia - Plan: potassium chloride SA (K-DUR,KLOR-CON) CR tablet 60 mEq  Reactive depression - Plan: escitalopram (LEXAPRO) 10 MG tablet  CURRENT THERAPY: Weekly Cisplatin/Paclitaxel beginning on 12/22/2015  INTERVAL HISTORY: Molly Campbell 55 y.o. 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  Starting systemic chemotherapy with weekly Cisplatin/Paclitaxel on 12/22/2015.    Malignant neoplasm of tonsillar fossa ([place])   10/05/2015  Imaging    CT imaging [facility], bilateral palatine tonsil enlargement with asymmetric abnormality of R tonsil and soft palate, possible R cervical adenopathy      10/25/2015 Procedure    Direct laryngoscopy and biopsyof right tonsillar mass.      10/27/2015 Pathology Results    invasive squamous cell carcinoma, p16 NEGATIVE      11/10/2015 PET scan    Bilateral palatine tonsil enlargement and hypermetabolism, with asymmetric hypermetabolism in the right palatine tonsil, consistent with primary malignancy in this location. Bilateral mildly enlarged hypermetabolic level 2 neck lymph nodes.      11/18/2015 Procedure    IR Gastrostomy Tube, fluoroscopic 20 French Pull through Gastrostomy      11/27/2015 Procedure    CT-guided core biopsy performed of the bilobed shaped soft tissue mass anterior to the proximal stomach.      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      12/07/2015 Imaging    NM Myocar multi w spect- Nuclear stress EF: 70%.      12/22/2015 -  Chemotherapy    Cisplatin/Paclitaxel weekly      02/25/2016 Imaging    CT CAP- 1. Small nonspecific pulmonary nodules are stable when compared with previous exam. 2. The solid lobulated mass arising from the anterior proximal stomach is un changed in size when compared with previous exam. 3. No new or progressive findings identified within the chest, abdomen or pelvis. 4. Aortic atherosclerosis and coronary artery calcification.      02/25/2016 Imaging    CT neck- History of right tonsil cancer status post surgery. No suspicious enhancement in the surgical region.       She reports issues with "depression."  She notes that her energy  level is down along with her appetite.  She denies crying.  We discussed medical management of this issue.  We discussed treatment moving forward.  She is agreeable to XRT which is recommended.  She has  established her compliance with treatment thus far.  Her weight is down 2 lbs.  Review of Systems  Constitutional: Positive for weight loss. Negative for chills and fever.  HENT: Negative.   Eyes: Negative.  Negative for double vision.  Respiratory: Negative.  Negative for cough.   Cardiovascular: Negative.  Negative for chest pain.  Gastrointestinal: Negative.  Negative for constipation, diarrhea, nausea and vomiting.  Genitourinary: Negative.   Musculoskeletal: Negative.   Skin: Negative.   Neurological: Negative.  Negative for weakness.  Endo/Heme/Allergies: Negative.   Psychiatric/Behavioral: Negative.     Past Medical History:  Diagnosis Date  . Alcohol abuse   . Alcoholic hepatitis [id number]  . Anemia   . Anxiety disorder   . Asthma   . Auditory hallucinations   . Cancer (HCC)    cervical  . Chronic back pain   . Chronic bronchitis with acute exacerbation ([place])   . Cocaine abuse   . Constipation   . COPD (chronic obstructive pulmonary disease) ([facility])   . Depression   . Dysrhythmia 2012   hx AF in er-cocaine-converted back NSR  . Emphysema   . GERD (gastroesophageal reflux disease)   . Headache   . History of cervical cancer 09/26/2013  . Hypertension   . Malignant neoplasm of tonsillar fossa ([place]) 11/12/2015  . Pneumonia    hx  . Poor circulation   . Schizophrenia ([place])   . Scoliosis   . Tobacco abuse     Past Surgical History:  Procedure Laterality Date  . ABDOMINAL HYSTERECTOMY    . BREAST BIOPSY  2005   lt bx  . COLONOSCOPY  05/07/2002   Dr.Demason- large approximately 1.5cm polyp pedunculated aproximately 10cm from the anal verge bx= adenomatous polyp  . DIRECT LARYNGOSCOPY N/A 10/25/2015   Procedure: DIRECT LARYNGOSCOPY WITH BIOPSY OF ORAL PHARYNGEAL LESION;  Surgeon: Leta Baptist, MD;  Location: [facility];  Service: ENT;  Laterality: N/A;  DIRECT LARYNGOSCOPY WITH BIOPSY OF ORAL PHARYNGEAL LESION  . ESOPHAGOGASTRODUODENOSCOPY  06/02/2002    Dr. Anthony Sar- mild distal esophagitis w/o stricturing or ulceration. stomach and pylorus are normal. inflammatory changes of proximal duldenum in the first portion.   . ESOPHAGOGASTRODUODENOSCOPY (EGD) WITH PROPOFOL N/A 11/23/2015   Procedure: ESOPHAGOGASTRODUODENOSCOPY (EGD) WITH PROPOFOL;  Surgeon: Danie Binder, MD;  [facility];  Service: Endoscopy;  Laterality: N/A;  1245  . FRACTURE SURGERY  right wrist   [place]  . MULTIPLE EXTRACTIONS WITH ALVEOLOPLASTY N/A 12/13/2015   Procedure: Extraction of tooth #'s 2,3,4,5,6,12,13,14,15,17,and 18 with alveoloplasty, bilateral maxillary tuberosity reductions, and gross debridement of remaining teeth;  Surgeon: Lenn Cal, DDS;  Location: [facility];  Service: Oral Surgery;  Laterality: N/A;  . PEG PLACEMENT  11/18/15  . PORTACATH PLACEMENT Right 11/18/15    Family History  Problem Relation Age of Onset  . Alzheimer's disease Mother   . Other Father     "he was beat to death"  . Heart disease Sister   . Gout Sister   . COPD Sister   . Seizures Sister   . Diabetes Sister   . Diabetes Brother   . Cancer Paternal Grandmother     not sure what kind  . Seizures Sister   . Other Sister     "  bones are messed up"  . Hypertension Brother   . Heart attack Brother   . Other Brother     MVA  . Diabetes Brother   . Other Brother     MVA  . Alcohol abuse Daughter   . Other Son     motorcycle accident  . Heart failure Other     Social History   Social History  . Marital status: Widowed    Spouse name: N/A  . Number of children: 3  . Years of education: N/A   Social History Main Topics  . Smoking status: Current Every Day Smoker    Packs/day: 1.00    Years: 48.00    Types: Cigarettes  . Smokeless tobacco: Former Systems developer    Types: Snuff     Comment: One pack a day  . Alcohol use 0.0 oz/week     Comment: 40 oz a day  . Drug use:     Types: Cocaine, Marijuana     Comment: last used on Friday 8/18  . Sexual activity: Not  Currently    Birth control/ protection: Surgical   Other Topics Concern  . Not on file   Social History Narrative  . No narrative on file     PHYSICAL EXAMINATION  ECOG PERFORMANCE STATUS: 1 - Symptomatic but completely ambulatory  There were no vitals filed for this visit.  Vitals - 1 value per visit [id number]  SYSTOLIC [id number]  DIASTOLIC 79  Pulse 98  Temperature 98.7  Respirations 18  Weight (lb) 102.8    GENERAL:alert, no distress, well developed, comfortable, cooperative, smiling and in chemo-bed, unaccompanied. SKIN: skin color, texture, turgor are normal, no rashes or significant lesions HEAD: Normocephalic, No masses, lesions, tenderness or abnormalities EYES: normal, EOMI, Conjunctiva are pink and non-injected EARS: External ears normal OROPHARYNX:lips, buccal mucosa, and tongue normal and mucous membranes are moist  NECK: supple, trachea midline LYMPH:  no palpable lymphadenopathy BREAST:not examined LUNGS: clear to auscultation  HEART: regular rate & rhythm ABDOMEN:abdomen soft and normal bowel sounds BACK: Back symmetric, no curvature. EXTREMITIES:less then 2 second capillary refill, no joint deformities, effusion, or inflammation, no skin discoloration, no cyanosis  NEURO: alert & oriented x 3 with fluent speech, no focal motor/sensory deficits, gait normal  LABORATORY DATA: CBC    Component Value Date/Time   WBC 4.2 03/14/2016 0920   RBC 2.43 (L) 03/14/2016 0920   HGB 8.9 (L) 03/14/2016 0920   HCT 25.4 (L) 03/14/2016 0920   PLT 159 03/14/2016 0920   MCV 104.5 (H) 03/14/2016 0920   MCH 36.6 (H) 03/14/2016 0920   MCHC 35.0 03/14/2016 0920   RDW 16.7 (H) 03/14/2016 0920   LYMPHSABS 2.4 03/14/2016 0920   MONOABS 0.5 03/14/2016 0920   EOSABS 0.0 03/14/2016 0920   BASOSABS 0.0 03/14/2016 0920      Chemistry      Component Value Date/Time   NA 132 (L) 03/14/2016 0920   K 3.2 (L) 03/14/2016 0920   CL 99 (L) 03/14/2016 0920   CO2 24 03/14/2016 0920    BUN 6 03/14/2016 0920   CREATININE 0.67 03/14/2016 0920   GLU 99 09/17/2015      Component Value Date/Time   CALCIUM 8.4 (L) 03/14/2016 0920   ALKPHOS 69 03/14/2016 0920   AST 20 03/14/2016 0920   ALT 12 (L) 03/14/2016 0920   BILITOT 0.3 03/14/2016 0920        PENDING LABS:   RADIOGRAPHIC STUDIES:  Ct Soft Tissue Neck W  Contrast  Result Date: 02/25/2016 CLINICAL DATA:  Malignant neoplasm of right tonsillar fossa. Surgery and ongoing chemotherapy. EXAM: CT NECK WITH CONTRAST TECHNIQUE: Multidetector CT imaging of the neck was performed using the standard protocol following the bolus administration of intravenous contrast. CONTRAST:  178mL ISOVUE-300 IOPAMIDOL (ISOVUE-300) INJECTION 61% COMPARISON:  10/05/2015 FINDINGS: Pharynx and larynx: Asymmetric palatini tonsils, fuller on the left, likely related to surgical defect on the right where there was reported tonsil cancer. The left tonsil has decreased in size compared to prior. On the postoperative side there is no suspicious or measurable enhancing tissue. No submucosal mass is noted. Tongue is somewhat rotated and pointed towards the left, which may be positional. No convincing fatty atrophy of the tongue muscles and no findings along the hypoglossal foramina. Salivary glands: Negative Thyroid: Negative Lymph nodes: None enlarged or abnormal density. Vascular: No significant finding. Mild atherosclerotic calcification. Limited intracranial: Negative Visualized orbits: Negative Mastoids and visualized paranasal sinuses: Clear Skeleton: No acute or aggressive finding Upper chest: Reported separately IMPRESSION: 1. History of right tonsil cancer status post surgery. No suspicious enhancement in the surgical region. 2. No adenopathy. Electronically Signed   By: Monte Fantasia M.D.   On: 02/25/2016 19:02   Ct Chest W Contrast  Result Date: 02/25/2016 CLINICAL DATA:  Followup head neck cancer EXAM: CT CHEST, ABDOMEN, AND PELVIS WITH CONTRAST  TECHNIQUE: Multidetector CT imaging of the chest, abdomen and pelvis was performed following the standard protocol during bolus administration of intravenous contrast. CONTRAST:  174mL ISOVUE-300 IOPAMIDOL (ISOVUE-300) INJECTION 61% COMPARISON:  PET-CT from 11/10/2015 FINDINGS: CT CHEST FINDINGS Cardiovascular: Heart size is normal. Aortic atherosclerosis identified. Aortic atherosclerosis noted. Calcification involving the RCA and left circumflex coronary artery noted. Mediastinum/Nodes: The trachea appears patent and is midline. Normal appearance of the esophagus. No mediastinal or hilar adenopathy. No axillary or supraclavicular adenopathy. Lungs/Pleura: No pleural fluid. Moderate to advanced changes of centrilobular emphysema. Stable 5 mm left lower lobe lung nodule, image 38 of series 4. 4 mm right midlung nodule is also unchanged, image number 26 of series 4. There are no new or enlarging nodules/masses. Musculoskeletal: No aggressive lytic or sclerotic bone lesions. CT ABDOMEN PELVIS FINDINGS Hepatobiliary: No focal liver abnormality is seen. No gallstones, gallbladder wall thickening, or biliary dilatation. Pancreas: Unremarkable. No pancreatic ductal dilatation or surrounding inflammatory changes. Spleen: Normal in size without focal abnormality. Adrenals/Urinary Tract: Adrenal glands are unremarkable. Bilateral renal cortical scarring is identified. No renal calculi, focal lesion, or hydronephrosis. Bladder is unremarkable. Stomach/Bowel: Gastrostomy tube is in place. Lobulated soft tissue mass associated with the anterior wall of the stomach is again noted. On today's study this measures 2.7 x 4.8 cm, image 50 of series 2. Previously 2.5 x 4.9 cm. Appendix appears normal. No evidence of bowel wall thickening, distention, or inflammatory changes. Vascular/Lymphatic: No significant vascular findings are present. No enlarged abdominal or pelvic lymph nodes. No upper abdominal or pelvic adenopathy.  Reproductive: Status post hysterectomy. No adnexal masses. Other: No free fluid or fluid collections. Musculoskeletal: No aggressive lytic or sclerotic bone lesions. IMPRESSION: 1. Small nonspecific pulmonary nodules are stable when compared with previous exam. 2. The solid lobulated mass arising from the anterior proximal stomach is un changed in size when compared with previous exam. 3. No new or progressive findings identified within the chest, abdomen or pelvis. 4. Aortic atherosclerosis and coronary artery calcification. Electronically Signed   By: Kerby Moors M.D.   On: 02/25/2016 15:59   Ct Abdomen Pelvis W Contrast  Result Date: 02/25/2016 CLINICAL DATA:  Followup head neck cancer EXAM: CT CHEST, ABDOMEN, AND PELVIS WITH CONTRAST TECHNIQUE: Multidetector CT imaging of the chest, abdomen and pelvis was performed following the standard protocol during bolus administration of intravenous contrast. CONTRAST:  137mL ISOVUE-300 IOPAMIDOL (ISOVUE-300) INJECTION 61% COMPARISON:  PET-CT from 11/10/2015 FINDINGS: CT CHEST FINDINGS Cardiovascular: Heart size is normal. Aortic atherosclerosis identified. Aortic atherosclerosis noted. Calcification involving the RCA and left circumflex coronary artery noted. Mediastinum/Nodes: The trachea appears patent and is midline. Normal appearance of the esophagus. No mediastinal or hilar adenopathy. No axillary or supraclavicular adenopathy. Lungs/Pleura: No pleural fluid. Moderate to advanced changes of centrilobular emphysema. Stable 5 mm left lower lobe lung nodule, image 38 of series 4. 4 mm right midlung nodule is also unchanged, image number 26 of series 4. There are no new or enlarging nodules/masses. Musculoskeletal: No aggressive lytic or sclerotic bone lesions. CT ABDOMEN PELVIS FINDINGS Hepatobiliary: No focal liver abnormality is seen. No gallstones, gallbladder wall thickening, or biliary dilatation. Pancreas: Unremarkable. No pancreatic ductal dilatation or  surrounding inflammatory changes. Spleen: Normal in size without focal abnormality. Adrenals/Urinary Tract: Adrenal glands are unremarkable. Bilateral renal cortical scarring is identified. No renal calculi, focal lesion, or hydronephrosis. Bladder is unremarkable. Stomach/Bowel: Gastrostomy tube is in place. Lobulated soft tissue mass associated with the anterior wall of the stomach is again noted. On today's study this measures 2.7 x 4.8 cm, image 50 of series 2. Previously 2.5 x 4.9 cm. Appendix appears normal. No evidence of bowel wall thickening, distention, or inflammatory changes. Vascular/Lymphatic: No significant vascular findings are present. No enlarged abdominal or pelvic lymph nodes. No upper abdominal or pelvic adenopathy. Reproductive: Status post hysterectomy. No adnexal masses. Other: No free fluid or fluid collections. Musculoskeletal: No aggressive lytic or sclerotic bone lesions. IMPRESSION: 1. Small nonspecific pulmonary nodules are stable when compared with previous exam. 2. The solid lobulated mass arising from the anterior proximal stomach is un changed in size when compared with previous exam. 3. No new or progressive findings identified within the chest, abdomen or pelvis. 4. Aortic atherosclerosis and coronary artery calcification. Electronically Signed   By: Kerby Moors M.D.   On: 02/25/2016 15:59   US Arterial Seg Single  Result Date: 02/14/2016 CLINICAL DATA:  Bilateral lower extremity pain. EXAM: NONINVASIVE PHYSIOLOGIC VASCULAR STUDY OF BILATERAL LOWER EXTREMITIES TECHNIQUE: Evaluation of both lower extremities were performed at rest, including calculation of ankle-brachial indices with single level Doppler, pressure and pulse volume recording. COMPARISON:  None. FINDINGS: Right ABI:  1.03 Left ABI:  1.05 Right Lower Extremity: Normal triphasic Doppler waveforms in the right ankle. Left Lower Extremity: Normal triphasic Doppler waveforms in the left ankle. IMPRESSION: Normal  lower extremity exam. Electronically Signed   By: Markus Daft M.D.   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  Starting systemic chemotherapy with weekly Cisplatin/Paclitaxel on 12/22/2015.  Complicated by illicit drug abuse.  Oncology history is updated.  Pre-treatment labs today: CBC diff, CMET, Magnesium.  I personally reviewed and went over laboratory results with the patient.  The results are noted within this dictation.    Hypokalemia is noted and therefore Kdur 60 mEq is given in the clinic.  She will continue with her home [place] as well.  Given her anemia, I have ordered an anemia panel for peripheral work-up of anemia.  This  will be drawn today.  I have recommended 1 unit PRBCs for her anemia as well.  I personally reviewed and went over radiographic studies with the patient.  The results are noted within this dictation.  CT CAP and neck are reviewed with the patient.  Images are stable and there are no new sites of disease.  As a result of stable scans, we will refer the patient to XRT.  She continues to tolerate therapy well without any patient reported complaints.    Weight is down 2 lbs compared to last week.  She reports issues with depression and therefore, I have prescribed Lexapro 10 mg daily.  She is educated that this does not work immediately.  We will increase her dose in 2-3 weeks if necessaary.  [place] Controlled Substance Reporting System is reviewed.  The last refill of this medication was on 02/25/2016.  I have refilled her Hydrocodone as a result.  She has tolerated therapy well and her compliance has been relatively stable.  Return as scheduled for IVF.  I note that she has run out of NS orders.  More orders are placed for Tues/Fri NS infusions.  She will receive her Tuesday fluids here in the clinic and Friday infusions  will be in Short-Stay.  Return in 1 week for treatment and 2 weeks for treatment and follow-up appointment.   ORDERS PLACED FOR THIS ENCOUNTER: Orders Placed This Encounter  Procedures  . Vitamin B12  . Folate  . Iron and TIBC  . Ferritin  . Practitioner attestation of consent  . Complete patient signature process for consent form  . Care order/instruction  . Type and screen    MEDICATIONS PRESCRIBED THIS ENCOUNTER: Meds ordered this encounter  Medications  . HYDROcodone-acetaminophen (NORCO) 5-325 MG tablet    Sig: Take 1-2 tablets by mouth every 4 (four) hours as needed for moderate pain or severe pain.    Dispense:  30 tablet    Refill:  0    Order Specific Question:   Supervising Provider    Answer:   Patrici Ranks [id number]  . potassium chloride SA (K-DUR,KLOR-CON) CR tablet 60 mEq  . escitalopram (LEXAPRO) 10 MG tablet    Sig: Take 1 tablet (10 mg total) by mouth daily.    Dispense:  30 tablet    Refill:  1    Order Specific Question:   Supervising Provider    Answer:   Patrici Ranks [id number]    THERAPY PLAN:  Continue treatment with supportive care as outlined above.  All questions were answered. The patient knows to call the clinic with any problems, questions or concerns. We can certainly see the patient much sooner if necessary.  Patient and plan discussed with Dr. Ancil Linsey and she is in agreement with the aforementioned.   This note is electronically signed by: Doy Mince 03/14/2016 11:49 AM

## 2016-03-14 NOTE — Patient Instructions (Signed)
[facility] Discharge Instructions for Patients Receiving Chemotherapy   Beginning January 23rd 2017 lab work for the [facility] will be done in the  Main lab at [facility] on 1st floor. If you have a lab appointment with the [facility] please come in thru the  Main Entrance and check in at the main information desk   Today you received the following chemotherapy agents Taxol and cisplatin  To help prevent nausea and vomiting after your treatment, we encourage you to take your nausea medication     If you develop nausea and vomiting, or diarrhea that is not controlled by your medication, call the clinic.  The clinic phone number is [phone]. Office hours are Monday-Friday 8:30am-5:00pm.  BELOW ARE SYMPTOMS THAT SHOULD BE REPORTED IMMEDIATELY:  *FEVER GREATER THAN 101.0 F  *CHILLS WITH OR WITHOUT FEVER  NAUSEA AND VOMITING THAT IS NOT CONTROLLED WITH YOUR NAUSEA MEDICATION  *UNUSUAL SHORTNESS OF BREATH  *UNUSUAL BRUISING OR BLEEDING  TENDERNESS IN MOUTH AND THROAT WITH OR WITHOUT PRESENCE OF ULCERS  *URINARY PROBLEMS  *BOWEL PROBLEMS  UNUSUAL RASH Items with * indicate a potential emergency and should be followed up as soon as possible. If you have an emergency after office hours please contact your primary care physician or go to the nearest emergency department.  Please call the clinic during office hours if you have any questions or concerns.   You may also contact the Patient Navigator at [phone] should you have any questions or need assistance in obtaining follow up care.      Resources For Cancer Patients and their Caregivers ? American Cancer Society: Can assist with transportation, wigs, general needs, runs Look Good Feel Better.        416-278-6663 ? Cancer Care: Provides financial assistance, online support groups, medication/co-pay assistance.  1-800-813-HOPE 445-287-1380) ? [facility] Assists  [place] cancer patients and their families through emotional , educational and financial support.  (907)640-6209 ? [place] DSS Where to apply for food stamps, Medicaid and utility assistance. 562-550-4080 ? [facility]: Transportation to medical appointments. 867-015-1570 ? Social Security Administration: May apply for disability if have a Stage IV cancer. (940)683-5015 (614)600-5293 ? [employer], Disability and Transit Services: Assists with nutrition, care and transit needs. 2790729377

## 2016-03-15 LAB — ABO/RH: ABO/RH(D): O POS

## 2016-03-16 ENCOUNTER — Other Ambulatory Visit (HOSPITAL_COMMUNITY): Payer: Self-pay

## 2016-03-17 ENCOUNTER — Encounter (HOSPITAL_BASED_OUTPATIENT_CLINIC_OR_DEPARTMENT_OTHER): Payer: Medicaid Other

## 2016-03-17 ENCOUNTER — Encounter (HOSPITAL_COMMUNITY): Payer: Self-pay

## 2016-03-17 DIAGNOSIS — C090 Malignant neoplasm of tonsillar fossa: Secondary | ICD-10-CM

## 2016-03-17 DIAGNOSIS — D649 Anemia, unspecified: Secondary | ICD-10-CM

## 2016-03-17 DIAGNOSIS — E876 Hypokalemia: Secondary | ICD-10-CM

## 2016-03-17 DIAGNOSIS — F1410 Cocaine abuse, uncomplicated: Secondary | ICD-10-CM | POA: Diagnosis not present

## 2016-03-17 MED ORDER — SODIUM CHLORIDE 0.9 % IV SOLN
INTRAVENOUS | Status: DC
  Administered 2016-03-17: 09:00:00 via INTRAVENOUS

## 2016-03-17 MED ORDER — DIPHENHYDRAMINE HCL 25 MG PO CAPS
25.0000 mg | ORAL_CAPSULE | Freq: Once | ORAL | Status: AC
  Administered 2016-03-17: 25 mg via ORAL
  Filled 2016-03-17: qty 1

## 2016-03-17 MED ORDER — SODIUM CHLORIDE 0.9% FLUSH
10.0000 mL | INTRAVENOUS | Status: AC | PRN
  Administered 2016-03-17: 10 mL

## 2016-03-17 MED ORDER — ACETAMINOPHEN 325 MG PO TABS
650.0000 mg | ORAL_TABLET | Freq: Once | ORAL | Status: AC
  Administered 2016-03-17: 650 mg via ORAL
  Filled 2016-03-17: qty 2

## 2016-03-17 MED ORDER — SODIUM CHLORIDE 0.9 % IV SOLN
250.0000 mL | Freq: Once | INTRAVENOUS | Status: AC
  Administered 2016-03-17: 250 mL via INTRAVENOUS

## 2016-03-17 MED ORDER — HEPARIN SOD (PORK) LOCK FLUSH 100 UNIT/ML IV SOLN
500.0000 [IU] | Freq: Every day | INTRAVENOUS | Status: AC | PRN
  Administered 2016-03-17: 500 [IU]
  Filled 2016-03-17: qty 5

## 2016-03-17 NOTE — Progress Notes (Signed)
Patient received one unit of blood today. Hydration fluids given today per orders. Patient tolerated well without problems. Vitals stable and discharged ambulatory from clinic. Follow up as scheduled.

## 2016-03-17 NOTE — Patient Instructions (Signed)
[place] at [facility] Discharge Instructions  RECOMMENDATIONS MADE BY THE CONSULTANT AND ANY TEST RESULTS WILL BE SENT TO YOUR REFERRING PHYSICIAN.  You received one unit of blood today. You received 3 hours of hydration fluids also.   Thank you for choosing [place] at [facility] to provide your oncology and hematology care.  To afford each patient quality time with our provider, please arrive at least 15 minutes before your scheduled appointment time.   Beginning January 23rd 2017 lab work for the [facility] will be done in the  Main lab at [facility] on 1st floor. If you have a lab appointment with the [facility] please come in thru the  Main Entrance and check in at the main information desk  You need to re-schedule your appointment should you arrive 10 or more minutes late.  We strive to give you quality time with our providers, and arriving late affects you and other patients whose appointments are after yours.  Also, if you no show three or more times for appointments you may be dismissed from the clinic at the providers discretion.     Again, thank you for choosing [facility].  Our hope is that these requests will decrease the amount of time that you wait before being seen by our physicians.       _____________________________________________________________  Should you have questions after your visit to [facility], please contact our office at [phone] between the hours of 8:30 a.m. and 4:30 p.m.  Voicemails left after 4:30 p.m. will not be returned until the following business day.  For prescription refill requests, have your pharmacy contact our office.         Resources For Cancer Patients and their Caregivers ? American Cancer Society: Can assist with transportation, wigs, general needs, runs Look Good Feel Better.        229-100-4394 ? Cancer Care: Provides financial  assistance, online support groups, medication/co-pay assistance.  1-800-813-HOPE 628-176-4709) ? [place] Assists [place] cancer patients and their families through emotional , educational and financial support.  [phone] ? [place] DSS Where to apply for food stamps, Medicaid and utility assistance. 239-025-2622 ? RCATS: Transportation to medical appointments. 365-813-1513 ? Social Security Administration: May apply for disability if have a Stage IV cancer. 862-326-5923 503-301-9725 ? [employer], Disability and Transit Services: Assists with nutrition, care and transit needs. [place] Support Programs: @10RELATIVEDAYS @ > Cancer Support Group  2nd Tuesday of the month 1pm-2pm, Journey Room  > Creative Journey  3rd Tuesday of the month 1130am-1pm, Journey Room  > Look Good Feel Better  1st Wednesday of the month 10am-12 noon, Journey Room (Call [place] to register [phone])

## 2016-03-18 LAB — TYPE AND SCREEN
ABO/RH(D): O POS
Antibody Screen: NEGATIVE
UNIT DIVISION: 0

## 2016-03-20 ENCOUNTER — Encounter (HOSPITAL_COMMUNITY): Payer: Self-pay

## 2016-03-22 ENCOUNTER — Encounter: Payer: Self-pay | Admitting: Dietician

## 2016-03-22 ENCOUNTER — Encounter (HOSPITAL_COMMUNITY): Payer: Medicaid Other | Attending: Hematology & Oncology

## 2016-03-22 ENCOUNTER — Encounter (HOSPITAL_COMMUNITY): Payer: Self-pay

## 2016-03-22 VITALS — BP 124/79 | HR 76 | Temp 98.6°F | Resp 16 | Wt 100.2 lb

## 2016-03-22 DIAGNOSIS — Z5111 Encounter for antineoplastic chemotherapy: Secondary | ICD-10-CM

## 2016-03-22 DIAGNOSIS — C090 Malignant neoplasm of tonsillar fossa: Secondary | ICD-10-CM | POA: Diagnosis not present

## 2016-03-22 DIAGNOSIS — E876 Hypokalemia: Secondary | ICD-10-CM

## 2016-03-22 LAB — CBC WITH DIFFERENTIAL/PLATELET
BASOS PCT: 0 %
Basophils Absolute: 0 10*3/uL (ref 0.0–0.1)
EOS ABS: 0 10*3/uL (ref 0.0–0.7)
Eosinophils Relative: 0 %
HCT: 31.6 % — ABNORMAL LOW (ref 36.0–46.0)
HEMOGLOBIN: 11.2 g/dL — AB (ref 12.0–15.0)
Lymphocytes Relative: 42 %
Lymphs Abs: 1.5 10*3/uL (ref 0.7–4.0)
MCH: 35 pg — ABNORMAL HIGH (ref 26.0–34.0)
MCHC: 35.4 g/dL (ref 30.0–36.0)
MCV: 98.8 fL (ref 78.0–100.0)
MONOS PCT: 12 %
Monocytes Absolute: 0.4 10*3/uL (ref 0.1–1.0)
NEUTROS PCT: 46 %
Neutro Abs: 1.7 10*3/uL (ref 1.7–7.7)
Platelets: 128 10*3/uL — ABNORMAL LOW (ref 150–400)
RBC: 3.2 MIL/uL — ABNORMAL LOW (ref 3.87–5.11)
RDW: 16 % — AB (ref 11.5–15.5)
WBC: 3.7 10*3/uL — AB (ref 4.0–10.5)

## 2016-03-22 LAB — COMPREHENSIVE METABOLIC PANEL
ALBUMIN: 3.8 g/dL (ref 3.5–5.0)
ALK PHOS: 62 U/L (ref 38–126)
ALT: 13 U/L — ABNORMAL LOW (ref 14–54)
ANION GAP: 14 (ref 5–15)
AST: 27 U/L (ref 15–41)
BUN: 15 mg/dL (ref 6–20)
CHLORIDE: 89 mmol/L — AB (ref 101–111)
CO2: 25 mmol/L (ref 22–32)
Calcium: 7.5 mg/dL — ABNORMAL LOW (ref 8.9–10.3)
Creatinine, Ser: 1.16 mg/dL — ABNORMAL HIGH (ref 0.44–1.00)
GFR calc Af Amer: 59 mL/min — ABNORMAL LOW (ref >60)
GFR calc non Af Amer: 51 mL/min — ABNORMAL LOW (ref >60)
GLUCOSE: 69 mg/dL (ref 65–99)
POTASSIUM: 3.2 mmol/L — AB (ref 3.5–5.1)
SODIUM: 128 mmol/L — AB (ref 135–145)
Total Bilirubin: 0.7 mg/dL (ref 0.3–1.2)
Total Protein: 6.8 g/dL (ref 6.5–8.1)

## 2016-03-22 MED ORDER — DEXTROSE-NACL 5-0.45 % IV SOLN
Freq: Once | INTRAVENOUS | Status: AC
  Administered 2016-03-22: 09:00:00 via INTRAVENOUS
  Filled 2016-03-22: qty 10

## 2016-03-22 MED ORDER — HEPARIN SOD (PORK) LOCK FLUSH 100 UNIT/ML IV SOLN
500.0000 [IU] | Freq: Once | INTRAVENOUS | Status: DC | PRN
  Filled 2016-03-22: qty 5

## 2016-03-22 MED ORDER — SODIUM CHLORIDE 0.9 % IV SOLN
40.0000 mg/m2 | Freq: Once | INTRAVENOUS | Status: AC
  Administered 2016-03-22: 59 mg via INTRAVENOUS
  Filled 2016-03-22: qty 59

## 2016-03-22 MED ORDER — SODIUM CHLORIDE 0.9% FLUSH
10.0000 mL | INTRAVENOUS | Status: DC | PRN
  Administered 2016-03-22: 10 mL
  Filled 2016-03-22: qty 10

## 2016-03-22 MED ORDER — PACLITAXEL CHEMO INJECTION 300 MG/50ML
80.0000 mg/m2 | Freq: Once | INTRAVENOUS | Status: AC
  Administered 2016-03-22: 120 mg via INTRAVENOUS
  Filled 2016-03-22: qty 20

## 2016-03-22 MED ORDER — FOSAPREPITANT DIMEGLUMINE INJECTION 150 MG
Freq: Once | INTRAVENOUS | Status: AC
  Administered 2016-03-22: 11:00:00 via INTRAVENOUS
  Filled 2016-03-22: qty 5

## 2016-03-22 MED ORDER — FAMOTIDINE IN NACL 20-0.9 MG/50ML-% IV SOLN
20.0000 mg | Freq: Once | INTRAVENOUS | Status: AC
  Administered 2016-03-22: 20 mg via INTRAVENOUS
  Filled 2016-03-22: qty 50

## 2016-03-22 MED ORDER — POTASSIUM CHLORIDE CRYS ER 20 MEQ PO TBCR
20.0000 meq | EXTENDED_RELEASE_TABLET | Freq: Once | ORAL | Status: AC
  Administered 2016-03-22: 20 meq via ORAL
  Filled 2016-03-22: qty 1

## 2016-03-22 MED ORDER — FUROSEMIDE 10 MG/ML IJ SOLN
20.0000 mg | Freq: Once | INTRAMUSCULAR | Status: AC
  Administered 2016-03-22: 20 mg via INTRAVENOUS
  Filled 2016-03-22: qty 2

## 2016-03-22 MED ORDER — DIPHENHYDRAMINE HCL 50 MG/ML IJ SOLN
50.0000 mg | Freq: Once | INTRAMUSCULAR | Status: AC
  Administered 2016-03-22: 50 mg via INTRAVENOUS
  Filled 2016-03-22: qty 1

## 2016-03-22 MED ORDER — ACETAMINOPHEN 325 MG PO TABS
650.0000 mg | ORAL_TABLET | Freq: Once | ORAL | Status: AC
  Administered 2016-03-22: 650 mg via ORAL
  Filled 2016-03-22: qty 2

## 2016-03-22 MED ORDER — SODIUM CHLORIDE 0.9 % IV SOLN
Freq: Once | INTRAVENOUS | Status: AC
  Administered 2016-03-22: 10:00:00 via INTRAVENOUS

## 2016-03-22 MED ORDER — PALONOSETRON HCL INJECTION 0.25 MG/5ML
0.2500 mg | Freq: Once | INTRAVENOUS | Status: AC
  Administered 2016-03-22: 0.25 mg via INTRAVENOUS
  Filled 2016-03-22: qty 5

## 2016-03-22 NOTE — Progress Notes (Signed)
F/U with high risk H&N patient w/ PEG.   55y/o female PMHx :Tobacco abuse (48 pack year), severe etoh abuse (6 pack or 40 oz daily), illicit drug abuse,HTN, alcoholic hepatitis, COPD,Cervical cancer,GERD, anxiety,schizophrenia,mental retardation(2014)Esophageal Dysphagia (2015)  Contacted Pt by visiting during her infusion.   Wt Readings from Last 10 Encounters:  03/22/16 100 lb 3.2 oz (45.5 kg)  03/14/16 102 lb 12.8 oz (46.6 kg)  03/03/16 104 lb 3.2 oz (47.3 kg)  02/24/16 105 lb 9.6 oz (47.9 kg)  02/18/16 103 lb 6.4 oz (46.9 kg)  02/09/16 106 lb (48.1 kg)  01/26/16 105 lb 6.4 oz (47.8 kg)  01/19/16 111 lb (50.3 kg)  01/07/16 110 lb (49.9 kg)  01/05/16 110 lb 3.2 oz (50 kg)   Patient weight has decreased by  2.5 lbs in 1 week, 5.5 lbs in 1 last month and 10 lbs in 2 months.   Unfortunately, pt reports that she is still infusing only 1 can a day despite exhaustive attempts to educate patient on the importance of using her tube.   She say Her PO intake has decreased. She is only eating 1 meal a day. RD tried to determine why her PO intake had decreased, but her response was unintelligible. RD asked if she just felt too poorly to use the tube and she responded "something like that". She says everything makes her sick, including "the milk".   Yet again, she asks for more Ensure to take orally instead of the TF formula. RD again explains that when she starts radiation it will be painful to swallow and she will need to rely on her tube for nutrition. RD doesn't feel comfortable enabling her disuse of the tube as this would hurt her more in the long run.   RD asked about when she was going to undergo radiotherapy. Discovered patient had "no showed" for her appointment at the radiation office. A new appointment was scheduled for her today for 11/28 which would mean radiation would begin early December.   RD continues to encourage pt to utilize her tube to no avail. She has impending  radiation that will very likely further reduce PO intake. She has regressed in terms of tube use since she first began treatment. She remains an extremely high risk patient.   Burtis Junes RD, LDN, [facility] Pager: [phone] 03/22/2016 12:21 PM

## 2016-03-22 NOTE — Progress Notes (Signed)
Potassium 56meq po given today as ordered. Patient already has her potasium to take at home , will encourage her to take this as prescribed. Instructed patient to take Calcium 500mg  twice a day per Dr. Whitney Muse.  Patient received chemo today per orders. Patient tolerated it well, no problems. Vitals were stable , patient stated that she had to leave when I went into check on her. 289mls of hydration fluids still left. Patient insisted on leaving. Discharged patient ambulatory from clinic.New schedule and discharge instructions given and highlighted the above info in them.

## 2016-03-22 NOTE — Patient Instructions (Signed)
[facility] Discharge Instructions for Patients Receiving Chemotherapy   Beginning January 23rd 2017 lab work for the [facility] will be done in the  Main lab at [facility] on 1st floor. If you have a lab appointment with the [facility] please come in thru the  Main Entrance and check in at the main information desk   You need to take Calcium 500mg  twice a day, get this over the counter. You need to take your potassium 20 meq twice a day.   You have a Radiation Oncology appointment November the 28th at [facility]  Today you received the following chemotherapy agents cisplatin, taxol  To help prevent nausea and vomiting after your treatment, we encourage you to take your nausea medication     If you develop nausea and vomiting, or diarrhea that is not controlled by your medication, call the clinic.  The clinic phone number is [phone]. Office hours are Monday-Friday 8:30am-5:00pm.  BELOW ARE SYMPTOMS THAT SHOULD BE REPORTED IMMEDIATELY:  *FEVER GREATER THAN 101.0 F  *CHILLS WITH OR WITHOUT FEVER  NAUSEA AND VOMITING THAT IS NOT CONTROLLED WITH YOUR NAUSEA MEDICATION  *UNUSUAL SHORTNESS OF BREATH  *UNUSUAL BRUISING OR BLEEDING  TENDERNESS IN MOUTH AND THROAT WITH OR WITHOUT PRESENCE OF ULCERS  *URINARY PROBLEMS  *BOWEL PROBLEMS  UNUSUAL RASH Items with * indicate a potential emergency and should be followed up as soon as possible. If you have an emergency after office hours please contact your primary care physician or go to the nearest emergency department.  Please call the clinic during office hours if you have any questions or concerns.   You may also contact the Patient Navigator at [phone] should you have any questions or need assistance in obtaining follow up care.      Resources For Cancer Patients and their Caregivers ? American Cancer Society: Can assist with transportation, wigs, general needs, runs Look Good Feel Better.         [phone] ? Cancer Care: Provides financial assistance, online support groups, medication/co-pay assistance.  1-800-813-HOPE 720-860-9697) ? [place] Assists [place] cancer patients and their families through emotional , educational and financial support.  717-627-0562 ? [place] DSS Where to apply for food stamps, Medicaid and utility assistance. 734-848-4835 ? RCATS: Transportation to medical appointments. ([phone] ? Social Security Administration: May apply for disability if have a Stage IV cancer. 3191636142 7045110072 ? [employer], Disability and Transit Services: Assists with nutrition, care and transit needs. [phone]

## 2016-03-24 ENCOUNTER — Ambulatory Visit (HOSPITAL_COMMUNITY): Payer: Self-pay

## 2016-03-28 ENCOUNTER — Encounter (HOSPITAL_BASED_OUTPATIENT_CLINIC_OR_DEPARTMENT_OTHER): Payer: Medicaid Other

## 2016-03-28 ENCOUNTER — Encounter: Payer: Self-pay | Admitting: Dietician

## 2016-03-28 ENCOUNTER — Encounter (HOSPITAL_BASED_OUTPATIENT_CLINIC_OR_DEPARTMENT_OTHER): Payer: Medicaid Other | Admitting: Hematology & Oncology

## 2016-03-28 ENCOUNTER — Encounter (HOSPITAL_COMMUNITY): Payer: Self-pay | Admitting: Hematology & Oncology

## 2016-03-28 ENCOUNTER — Other Ambulatory Visit (HOSPITAL_COMMUNITY): Payer: Self-pay | Admitting: Oncology

## 2016-03-28 VITALS — BP 144/85 | HR 73 | Temp 97.8°F | Resp 16

## 2016-03-28 VITALS — BP 122/83 | HR 91 | Temp 98.3°F | Resp 16 | Wt 100.2 lb

## 2016-03-28 DIAGNOSIS — F79 Unspecified intellectual disabilities: Secondary | ICD-10-CM

## 2016-03-28 DIAGNOSIS — F1410 Cocaine abuse, uncomplicated: Secondary | ICD-10-CM | POA: Diagnosis not present

## 2016-03-28 DIAGNOSIS — R110 Nausea: Secondary | ICD-10-CM | POA: Diagnosis not present

## 2016-03-28 DIAGNOSIS — Z95828 Presence of other vascular implants and grafts: Secondary | ICD-10-CM

## 2016-03-28 DIAGNOSIS — K7010 Alcoholic hepatitis without ascites: Secondary | ICD-10-CM

## 2016-03-28 DIAGNOSIS — Z5111 Encounter for antineoplastic chemotherapy: Secondary | ICD-10-CM

## 2016-03-28 DIAGNOSIS — Z720 Tobacco use: Secondary | ICD-10-CM

## 2016-03-28 DIAGNOSIS — C090 Malignant neoplasm of tonsillar fossa: Secondary | ICD-10-CM

## 2016-03-28 DIAGNOSIS — R634 Abnormal weight loss: Secondary | ICD-10-CM

## 2016-03-28 DIAGNOSIS — C49A2 Gastrointestinal stromal tumor of stomach: Secondary | ICD-10-CM

## 2016-03-28 DIAGNOSIS — K219 Gastro-esophageal reflux disease without esophagitis: Secondary | ICD-10-CM | POA: Diagnosis not present

## 2016-03-28 DIAGNOSIS — F1010 Alcohol abuse, uncomplicated: Secondary | ICD-10-CM

## 2016-03-28 DIAGNOSIS — M79604 Pain in right leg: Secondary | ICD-10-CM | POA: Diagnosis not present

## 2016-03-28 DIAGNOSIS — E876 Hypokalemia: Secondary | ICD-10-CM | POA: Diagnosis not present

## 2016-03-28 DIAGNOSIS — M79605 Pain in left leg: Secondary | ICD-10-CM

## 2016-03-28 DIAGNOSIS — N179 Acute kidney failure, unspecified: Secondary | ICD-10-CM | POA: Diagnosis not present

## 2016-03-28 DIAGNOSIS — R197 Diarrhea, unspecified: Secondary | ICD-10-CM | POA: Diagnosis not present

## 2016-03-28 LAB — CBC WITH DIFFERENTIAL/PLATELET
BASOS ABS: 0 10*3/uL (ref 0.0–0.1)
Basophils Relative: 0 %
Eosinophils Absolute: 0 10*3/uL (ref 0.0–0.7)
Eosinophils Relative: 0 %
HEMATOCRIT: 28.9 % — AB (ref 36.0–46.0)
HEMOGLOBIN: 10.4 g/dL — AB (ref 12.0–15.0)
LYMPHS ABS: 2 10*3/uL (ref 0.7–4.0)
LYMPHS PCT: 68 %
MCH: 35.5 pg — ABNORMAL HIGH (ref 26.0–34.0)
MCHC: 36 g/dL (ref 30.0–36.0)
MCV: 98.6 fL (ref 78.0–100.0)
Monocytes Absolute: 0.3 10*3/uL (ref 0.1–1.0)
Monocytes Relative: 9 %
NEUTROS ABS: 0.7 10*3/uL — AB (ref 1.7–7.7)
Neutrophils Relative %: 23 %
Platelets: 101 10*3/uL — ABNORMAL LOW (ref 150–400)
RBC: 2.93 MIL/uL — AB (ref 3.87–5.11)
RDW: 15.9 % — ABNORMAL HIGH (ref 11.5–15.5)
WBC: 2.9 10*3/uL — AB (ref 4.0–10.5)

## 2016-03-28 LAB — COMPREHENSIVE METABOLIC PANEL
ALK PHOS: 60 U/L (ref 38–126)
ALT: 11 U/L — AB (ref 14–54)
AST: 22 U/L (ref 15–41)
Albumin: 3.8 g/dL (ref 3.5–5.0)
Anion gap: 13 (ref 5–15)
BILIRUBIN TOTAL: 0.4 mg/dL (ref 0.3–1.2)
BUN: 25 mg/dL — AB (ref 6–20)
CALCIUM: 7 mg/dL — AB (ref 8.9–10.3)
CHLORIDE: 89 mmol/L — AB (ref 101–111)
CO2: 26 mmol/L (ref 22–32)
CREATININE: 1.99 mg/dL — AB (ref 0.44–1.00)
GFR calc Af Amer: 31 mL/min — ABNORMAL LOW (ref >60)
GFR, EST NON AFRICAN AMERICAN: 27 mL/min — AB (ref >60)
Glucose, Bld: 101 mg/dL — ABNORMAL HIGH (ref 65–99)
Potassium: 2.8 mmol/L — ABNORMAL LOW (ref 3.5–5.1)
Sodium: 128 mmol/L — ABNORMAL LOW (ref 135–145)
TOTAL PROTEIN: 6.6 g/dL (ref 6.5–8.1)

## 2016-03-28 MED ORDER — HYDROCODONE-ACETAMINOPHEN 5-325 MG PO TABS: 1.0000 | ORAL_TABLET | ORAL | 0 refills | Status: DC | PRN

## 2016-03-28 MED ORDER — HEPARIN SOD (PORK) LOCK FLUSH 100 UNIT/ML IV SOLN
INTRAVENOUS | Status: AC
  Filled 2016-03-28: qty 5

## 2016-03-28 MED ORDER — HEPARIN SOD (PORK) LOCK FLUSH 100 UNIT/ML IV SOLN
500.0000 [IU] | Freq: Once | INTRAVENOUS | Status: AC
  Administered 2016-03-28: 500 [IU] via INTRAVENOUS

## 2016-03-28 MED ORDER — POTASSIUM CHLORIDE 10 MEQ/100ML IV SOLN
10.0000 meq | INTRAVENOUS | Status: AC
  Administered 2016-03-28 (×3): 10 meq via INTRAVENOUS
  Filled 2016-03-28 (×3): qty 100

## 2016-03-28 MED ORDER — POTASSIUM CHLORIDE 2 MEQ/ML IV SOLN
Freq: Once | INTRAVENOUS | Status: AC
  Administered 2016-03-28: 10:00:00 via INTRAVENOUS
  Filled 2016-03-28: qty 10

## 2016-03-28 NOTE — Progress Notes (Signed)
F/U with high risk H&N patient w/ PEG.   55y/o female PMHx :Tobacco abuse (48 pack year), severe etoh abuse (6 pack or 40 oz daily), illicit drug abuse,HTN, alcoholic hepatitis, COPD,Cervical cancer,GERD, anxiety,schizophrenia,mental retardation(2014)Esophageal Dysphagia (2015)  Visited patient during her infusion  Wt Readings from Last 10 Encounters:  03/28/16 100 lb 3.2 oz (45.5 kg)  03/22/16 100 lb 3.2 oz (45.5 kg)  03/14/16 102 lb 12.8 oz (46.6 kg)  03/03/16 104 lb 3.2 oz (47.3 kg)  02/24/16 105 lb 9.6 oz (47.9 kg)  02/18/16 103 lb 6.4 oz (46.9 kg)  02/09/16 106 lb (48.1 kg)  01/26/16 105 lb 6.4 oz (47.8 kg)  01/19/16 111 lb (50.3 kg)  01/07/16 110 lb (49.9 kg)   Patient weight has remained the same this week.   She is easier to understand today.   She says she has been having severe nausea and vomiting. Once this begins it lasts all day. She says she does not take her nausea medication "I may have only taken that 1-2x". RD encouraged her to use this as it is intended to relieve her severe nausea.   She says the "milk", which I how she refers to the tube feeding makes her sick. However she says she gets sick with other meals. She is drinking one can and infusing another.   She did mention she keeps the tube feeding in the refrigerator. RD stated she should be infusing cans that are room temp. She then said she does let them sit out before infusing.   RD encouraged her to start taking her nausea medication as prescribed so that she is more apt to infuse more tube feeding.   RD continues to push her to infuse more tube feeding cans, but has been unsuccessful. Does not sound that she is even attempting to.   She has her radiation appointment in a week.   Burtis Junes RD, LDN, [facility] Pager: [phone] 03/28/2016 12:09 PM

## 2016-03-28 NOTE — Progress Notes (Signed)
HOLD chemo today. Give hydration fluids as ordered over 4 hours. Give potassium 10 meq IV x 3 runs today.  Tolerated hydration fluids and potassium runs well. Stable and ambulatory on discharge home to self.

## 2016-03-28 NOTE — Patient Instructions (Signed)
[facility] at [facility] Discharge Instructions  RECOMMENDATIONS MADE BY THE CONSULTANT AND ANY TEST RESULTS WILL BE SENT TO YOUR REFERRING PHYSICIAN.  You saw Dr.Penland today. See Amy at checkout for appointments.  Thank you for choosing [facility] at [facility] to provide your oncology and hematology care.  To afford each patient quality time with our provider, please arrive at least 15 minutes before your scheduled appointment time.   Beginning January 23rd 2017 lab work for the Cancer Center will be done in the  Main lab at [facility] on 1st floor. If you have a lab appointment with the Cancer Center please come in thru the  Main Entrance and check in at the main information desk  You need to re-schedule your appointment should you arrive 10 or more minutes late.  We strive to give you quality time with our providers, and arriving late affects you and other patients whose appointments are after yours.  Also, if you no show three or more times for appointments you may be dismissed from the clinic at the providers discretion.     Again, thank you for choosing [facility].  Our hope is that these requests will decrease the amount of time that you wait before being seen by our physicians.       _____________________________________________________________  Should you have questions after your visit to [facility], please contact our office at [phone] between the hours of 8:30 a.m. and 4:30 p.m.  Voicemails left after 4:30 p.m. will not be returned until the following business day.  For prescription refill requests, have your pharmacy contact our office.         Resources For Cancer Patients and their Caregivers ? American Cancer Society: Can assist with transportation, wigs, general needs, runs Look Good Feel Better.        1-888-227-6333 ? Cancer Care: Provides financial assistance, online support  groups, medication/co-pay assistance.  1-800-813-HOPE (4673) ? [facility] Assists [place] cancer patients and their families through emotional , educational and financial support.  336-427-4357 ? [place] DSS Where to apply for food stamps, Medicaid and utility assistance. 336-342-1394 ? RCATS: Transportation to medical appointments. 336-347-2287 ? Social Security Administration: May apply for disability if have a Stage IV cancer. 336-342-7796 1-800-772-1213 ? [place] Aging, Disability and Transit Services: Assists with nutrition, care and transit needs. 336-349-2343  Cancer Center Support Programs: @10RELATIVEDAYS@ > Cancer Support Group  2nd Tuesday of the month 1pm-2pm, Journey Room  > Creative Journey  3rd Tuesday of the month 1130am-1pm, Journey Room  > Look Good Feel Better  1st Wednesday of the month 10am-12 noon, Journey Room (Call American Cancer [employer] to register 1-800-395-5775)    

## 2016-03-28 NOTE — Patient Instructions (Signed)
[place] at [facility] Discharge Instructions  RECOMMENDATIONS MADE BY THE CONSULTANT AND ANY TEST RESULTS WILL BE SENT TO YOUR REFERRING PHYSICIAN.  Hydration fluids and IV potassium given as ordered. Return as scheduled.  Thank you for choosing [facility] at [facility] to provide your oncology and hematology care.  To afford each patient quality time with our provider, please arrive at least 15 minutes before your scheduled appointment time.   Beginning January 23rd 2017 lab work for the [facility] will be done in the  Main lab at [facility] on 1st floor. If you have a lab appointment with the [facility] please come in thru the  Main Entrance and check in at the main information desk  You need to re-schedule your appointment should you arrive 10 or more minutes late.  We strive to give you quality time with our providers, and arriving late affects you and other patients whose appointments are after yours.  Also, if you no show three or more times for appointments you may be dismissed from the clinic at the providers discretion.     Again, thank you for choosing [facility].  Our hope is that these requests will decrease the amount of time that you wait before being seen by our physicians.       _____________________________________________________________  Should you have questions after your visit to [facility], please contact our office at [phone] between the hours of 8:30 a.m. and 4:30 p.m.  Voicemails left after 4:30 p.m. will not be returned until the following business day.  For prescription refill requests, have your pharmacy contact our office.         Resources For Cancer Patients and their Caregivers ? American Cancer Society: Can assist with transportation, wigs, general needs, runs Look Good Feel Better.        (708)607-5168 ? Cancer Care: Provides financial assistance, online  support groups, medication/co-pay assistance.  1-800-813-HOPE 484-368-0809) ? [place] Assists [place] cancer patients and their families through emotional , educational and financial support.  (828) 101-5888 ? [place] DSS Where to apply for food stamps, Medicaid and utility assistance. 478-796-9725 ? RCATS: Transportation to medical appointments. (903)192-2418 ? Social Security Administration: May apply for disability if have a Stage IV cancer. 931-539-2538 (308)165-6323 ? [employer], Disability and Transit Services: Assists with nutrition, care and transit needs. [place] Support Programs: @10RELATIVEDAYS @ > Cancer Support Group  2nd Tuesday of the month 1pm-2pm, Journey Room  > Creative Journey  3rd Tuesday of the month 1130am-1pm, Journey Room  > Look Good Feel Better  1st Wednesday of the month 10am-12 noon, Journey Room (Call [place] to register [phone])

## 2016-03-28 NOTE — Progress Notes (Signed)
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      10/27/2015 Pathology Results    invasive squamous cell carcinoma, p16 NEGATIVE      11/10/2015 PET scan    Bilateral palatine tonsil enlargement and hypermetabolism, with asymmetric hypermetabolism in the right palatine tonsil, consistent with primary malignancy in this location. Bilateral mildly enlarged hypermetabolic level 2 neck lymph nodes.      11/18/2015 Procedure    IR Gastrostomy Tube, fluoroscopic 20 French Pull through Gastrostomy      11/27/2015 Procedure    CT-guided core biopsy performed of the bilobed shaped soft tissue mass anterior to the proximal stomach.      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      12/07/2015 Imaging    NM Myocar multi w spect- Nuclear stress EF: 70%.      12/22/2015 -  Chemotherapy    Cisplatin/Paclitaxel weekly      02/25/2016 Imaging    CT CAP- 1. Small nonspecific pulmonary  nodules are stable when compared with previous exam. 2. The solid lobulated mass arising from the anterior proximal stomach is un changed in size when compared with previous exam. 3. No new or progressive findings identified within the chest, abdomen or pelvis. 4. Aortic atherosclerosis and coronary artery calcification.      02/25/2016 Imaging    CT neck- History of right tonsil cancer status post surgery. No suspicious enhancement in the surgical region.        HISTORY OF PRESENTING ILLNESS: Molly Campbell 55 y.o. 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 She was referred to XRT in [place] but missed her appointment. Next appointment is scheduled for next Friday. Her daughter is going to take her. She is willing to proceed with therapy.  Molly Campbell is unaccompanied. She is here for ongoing Cisplatin / Paclitaxel.   She reports diarrhea, vomiting, and weight loss. She is unable to eat normally. She reports nausea, though not all the time. She gags when taking oral pills. She also vomits when she gets hot.  She reports pain in her legs. "I hurt so bad everything locks up". She reports her feet feel numb. Her legs hurt before starting chemotherapy but seem to be getting worse. The numbness in her feet began after starting chemotherapy. She denies problems buttoning her shirts, no falls.   MEDICAL HISTORY:  Past Medical History:  Diagnosis Date  . Alcohol abuse   . Alcoholic hepatitis [id number]  .  Anemia   . Anxiety disorder   . Asthma   . Auditory hallucinations   . Cancer (HCC)    cervical  . Chronic back pain   . Chronic bronchitis with acute exacerbation ([place])   . Cocaine abuse   . Constipation   . COPD (chronic obstructive pulmonary disease) ([place])   . Depression   . Dysrhythmia 2012   hx AF in er-cocaine-converted back NSR  . Emphysema   . GERD (gastroesophageal reflux disease)   . Headache   .  History of cervical cancer 09/26/2013  . Hypertension   . Malignant neoplasm of tonsillar fossa ([place]) 11/12/2015  . Pneumonia    hx  . Poor circulation   . Schizophrenia ([place])   . Scoliosis   . Tobacco abuse     SURGICAL HISTORY: Past Surgical History:  Procedure Laterality Date  . ABDOMINAL HYSTERECTOMY    . BREAST BIOPSY  2005   lt bx  . COLONOSCOPY  05/07/2002   Dr.Demason- large approximately 1.5cm polyp pedunculated aproximately 10cm from the anal verge bx= adenomatous polyp  . DIRECT LARYNGOSCOPY N/A 10/25/2015   Procedure: DIRECT LARYNGOSCOPY WITH BIOPSY OF ORAL PHARYNGEAL LESION;  Surgeon: Leta Baptist, MD;  Location: [facility];  Service: ENT;  Laterality: N/A;  DIRECT LARYNGOSCOPY WITH BIOPSY OF ORAL PHARYNGEAL LESION  . ESOPHAGOGASTRODUODENOSCOPY  06/02/2002   Dr. Anthony Sar- mild distal esophagitis w/o stricturing or ulceration. stomach and pylorus are normal. inflammatory changes of proximal duldenum in the first portion.   . ESOPHAGOGASTRODUODENOSCOPY (EGD) WITH PROPOFOL N/A 11/23/2015   Procedure: ESOPHAGOGASTRODUODENOSCOPY (EGD) WITH PROPOFOL;  Surgeon: Danie Binder, MD;  [facility];  Service: Endoscopy;  Laterality: N/A;  1245  . FRACTURE SURGERY  right wrist   [place]  . MULTIPLE EXTRACTIONS WITH ALVEOLOPLASTY N/A 12/13/2015   Procedure: Extraction of tooth #'s 2,3,4,5,6,12,13,14,15,17,and 18 with alveoloplasty, bilateral maxillary tuberosity reductions, and gross debridement of remaining teeth;  Surgeon: Lenn Cal, DDS;  Location: [facility];  Service: Oral Surgery;  Laterality: N/A;  . PEG PLACEMENT  11/18/15  . PORTACATH PLACEMENT Right 11/18/15    SOCIAL HISTORY: Social History   Social History  . Marital status: Widowed    Spouse name: N/A  . Number of children: 3  . Years of education: N/A   Occupational History  . Not on file.   Social History Main Topics  . Smoking status: Current Every Day Smoker    Packs/day: 1.00     Years: 48.00    Types: Cigarettes  . Smokeless tobacco: Former Systems developer    Types: Snuff     Comment: One pack a day  . Alcohol use 0.0 oz/week     Comment: 40 oz a day  . Drug use:     Types: Cocaine, Marijuana     Comment: last used on Friday 8/18  . Sexual activity: Not Currently    Birth control/ protection: Surgical   Other Topics Concern  . Not on file   Social History Narrative  . No narrative on file   Born here in [place]. Daughter, 2 daughters. Lost her son. 10 grandchildren. No great-grandchildren yet. Used to work pulling tobacco. Is a smoker. Close to 2 ppd. Started smoking at the age of 63. She notes "I don't drink water." 2 40 oz bottles of beer a day. (?)  She says she does regular things for hobbies. Her daughter notes that she likes to go fishing.  FAMILY HISTORY: Family History  Problem Relation Age of Onset  . Alzheimer's disease Mother   . Other Father     "he was beat to death"  . Heart disease Sister   . Gout Sister   . COPD Sister   . Seizures Sister   . Diabetes Sister   . Diabetes Brother   . Cancer Paternal Grandmother     not sure what kind  . Seizures Sister   . Other Sister     "bones are messed up"  . Hypertension Brother   . Heart attack Brother   . Other Brother     MVA  . Diabetes Brother   . Other Brother     MVA  . Alcohol abuse Daughter   . Other Son     motorcycle accident  . Heart failure Other    Mother & father deceased. Mother was 36 when she died. Had Alzheimer's. Father was younger. 4 brothers & 2 sisters living (and she's one of the sisters). There were 13 siblings total.  ALLERGIES:  is allergic to benicar [olmesartan]; ibuprofen; and latex.  MEDICATIONS:  Current Outpatient Prescriptions  Medication Sig Dispense Refill  . albuterol (PROVENTIL HFA;VENTOLIN HFA) 108 (90 BASE) MCG/ACT inhaler Inhale 1-2 puffs into the lungs every 6 (six) hours as needed for wheezing or shortness of breath. 1 Inhaler 0   . amLODipine (NORVASC) 5 MG tablet Take 5 mg by mouth daily.    Marland Kitchen CISPLATIN IV Inject into the vein.    Marland Kitchen dexamethasone (DECADRON) 4 MG tablet Take 2 tablets by mouth once a day on the day after chemotherapy and then take 2 tablets two times a day for 2 days. Take with food. 30 tablet 1  . escitalopram (LEXAPRO) 10 MG tablet Take 1 tablet (10 mg total) by mouth daily. 30 tablet 1  . HYDROcodone-acetaminophen (NORCO) 5-325 MG tablet Take 1-2 tablets by mouth every 4 (four) hours as needed for moderate pain or severe pain. 30 tablet 0  . lidocaine (XYLOCAINE) 2 % solution Mix 1 part 2%viscous lidocaine,1part H2O.Swish and swallow 22mL of this mixture,53min before meals and at bedtime, up to QID 100 mL 5  . lidocaine-prilocaine (EMLA) cream Apply to affected area once 30 g 3  . Multiple Vitamins-Minerals (MULTIVITAMIN) tablet Take 1 tablet by mouth daily. 180 tablet 1  . nicotine (NICODERM CQ - DOSED IN MG/24 HOURS) 14 mg/24hr patch Place 1 patch (14 mg total) onto the skin daily. Apply 21 mg patch daily x 6 wk, then 14mg  patch daily x 2 wk, then 7 mg patch daily x 2 wk 14 patch 0  . nicotine (NICODERM CQ - DOSED IN MG/24 HOURS) 21 mg/24hr patch Place 1 patch (21 mg total) onto the skin daily. Apply 21 mg patch daily x 6 wk, then 14mg  patch daily x 2 wk, then 7 mg patch daily x 2 wk 14 patch 2  . nicotine (NICODERM CQ - DOSED IN MG/24 HR) 7 mg/24hr patch Place 1 patch (7 mg total) onto the skin daily. Apply 21 mg patch daily x 6 wk, then 14mg  patch daily x 2 wk, then 7 mg patch daily x 2 wk 14 patch 0  . Nutritional Supplements (FEEDING SUPPLEMENT, OSMOLITE 1.5 CAL,) LIQD 5 cans via PEG, split into 3 meals of 1.5, 1.5 and 2 cans. Flush with 150 cc water before and after each feed (Patient taking differently: by PEG Tube route See admin instructions. 5 cans via PEG, split into 3 meals of 1.5, 1.5 and  2 cans. Flush with 150 cc water before and after each feed)  0  . omeprazole (PRILOSEC) 20 MG capsule 1  PO EVERY MORNING 31 capsule 11  . ondansetron (ZOFRAN) 8 MG tablet Take 1 tablet (8 mg total) by mouth 2 (two) times daily as needed. Start on the third day after chemotherapy. 30 tablet 1  . PACLitaxel (TAXOL IV) Inject into the vein. weekly    . polyethylene glycol powder (GLYCOLAX/MIRALAX) powder Take one capsule and mix in 4-6 ounces of water, take one to two times daily as needed for constipation. 527 g 3  . potassium chloride SA (K-DUR,KLOR-CON) 20 MEQ tablet Take 1 tablet (20 mEq total) by mouth 2 (two) times daily. 60 tablet 1  . prochlorperazine (COMPAZINE) 10 MG tablet Take 1 tablet (10 mg total) by mouth every 6 (six) hours as needed (Nausea or vomiting). 30 tablet 1  . sodium fluoride (FLUORISHIELD) 1.1 % GEL dental gel Instill one drop of gel per tooth space of fluoride tray. Place over teeth for 5 minutes. Remove. Spit out excess. Repeat nightly. 120 mL 11  . thiamine (VITAMIN B-1) 100 MG tablet Take 1 tablet (100 mg total) by mouth daily. 180 tablet 1   No current facility-administered medications for this visit.    Facility-Administered Medications Ordered in Other Visits  Medication Dose Route Frequency Provider Last Rate Last Dose  . 0.9 %  sodium chloride infusion   Intravenous Continuous Manon Hilding Kefalas, PA-C      . 0.9 %  sodium chloride infusion   Intravenous Continuous Manon Hilding Kefalas, PA-C      . 0.9 %  sodium chloride infusion   Intravenous Continuous Manon Hilding Kefalas, PA-C      . 0.9 %  sodium chloride infusion   Intravenous Continuous Manon Hilding Kefalas, PA-C      . 0.9 %  sodium chloride infusion   Intravenous Continuous Manon Hilding Kefalas, PA-C      . 0.9 %  sodium chloride infusion   Intravenous Continuous Manon Hilding Kefalas, PA-C      . 0.9 %  sodium chloride infusion   Intravenous Continuous Manon Hilding Kefalas, PA-C      . dextrose 5 % and 0.45% NaCl 1,000 mL with potassium chloride 20 mEq, magnesium sulfate 12 mEq infusion   Intravenous Once Patrici Ranks, MD        . dextrose 5 % and 0.45% NaCl 1,000 mL with potassium chloride 20 mEq, magnesium sulfate 12 mEq infusion   Intravenous Once Patrici Ranks, MD      . dextrose 5 % and 0.45% NaCl 1,000 mL with potassium chloride 20 mEq, magnesium sulfate 12 mEq infusion   Intravenous Once Patrici Ranks, MD        Review of Systems  Constitutional: Positive for weight loss.  Eyes: Negative.   Respiratory: Negative.   Cardiovascular: Negative.   Gastrointestinal: Positive for diarrhea and vomiting.  Genitourinary: Negative.   Musculoskeletal:       Leg pain  Skin: Negative.   Neurological: Negative.   Endo/Heme/Allergies: Negative.   Psychiatric/Behavioral: Positive for depression and substance abuse.  All other systems reviewed and are negative. 14 point ROS was done and is otherwise as detailed above or in HPI   PHYSICAL EXAMINATION: ECOG PERFORMANCE STATUS: 1 - Symptomatic but completely ambulatory  Vitals:   03/28/16 0842  BP: 122/83  Pulse: 91  Resp: 16  Temp: 98.3 F (36.8 C)   Filed Weights  03/28/16 0842  Weight: 100 lb 3.2 oz (45.5 kg)    Physical Exam  Constitutional: She is oriented to person, place, and time and well-developed, well-nourished, and in no distress.  HENT:  Head: Normocephalic and atraumatic.  Mouth/Throat: Oropharynx is clear and moist.  Glossal palatine arch with tumor related changes, improved  Eyes: Conjunctivae and EOM are normal. Pupils are equal, round, and reactive to light. Right eye exhibits no discharge. Left eye exhibits no discharge. No scleral icterus.  Neck: Normal range of motion. Neck supple. No JVD present. No tracheal deviation present. No thyromegaly present.  Cardiovascular: Normal rate, regular rhythm and normal heart sounds.   Pulmonary/Chest: Effort normal and breath sounds normal. No stridor. No respiratory distress. She has no wheezes. She has no rales. She exhibits no tenderness.  Abdominal: Soft. Bowel sounds are normal. She  exhibits no distension. There is no tenderness. There is no rebound.  Gastrostomy tube    Musculoskeletal: Normal range of motion. She exhibits no edema or deformity.  Lymphadenopathy:    She has no cervical adenopathy.  Neurological: She is alert and oriented to person, place, and time. No cranial nerve deficit.  Skin: Skin is warm and dry. No rash noted. No erythema. No pallor.  Nursing note and vitals reviewed.   LABORATORY DATA:  I have reviewed the data as listed Lab Results  Component Value Date   WBC 2.9 (L) 03/28/2016   HGB 10.4 (L) 03/28/2016   HCT 28.9 (L) 03/28/2016   MCV 98.6 03/28/2016   PLT PENDING 03/28/2016   CMP     Component Value Date/Time   NA 128 (L) 03/28/2016 0846   K 2.8 (L) 03/28/2016 0846   CL 89 (L) 03/28/2016 0846   CO2 26 03/28/2016 0846   GLUCOSE 101 (H) 03/28/2016 0846   BUN 25 (H) 03/28/2016 0846   CREATININE 1.99 (H) 03/28/2016 0846   CALCIUM 7.0 (L) 03/28/2016 0846   PROT 6.6 03/28/2016 0846   ALBUMIN 3.8 03/28/2016 0846   AST 22 03/28/2016 0846   ALT 11 (L) 03/28/2016 0846   ALKPHOS 60 03/28/2016 0846   BILITOT 0.4 03/28/2016 0846   GFRNONAA 27 (L) 03/28/2016 0846   GFRAA 31 (L) 03/28/2016 0846    RADIOGRAPHIC STUDIES: I have personally reviewed the radiological images as listed and agreed with the findings in the report. Study Result   CLINICAL DATA:  Followup head neck cancer  EXAM: CT CHEST, ABDOMEN, AND PELVIS WITH CONTRAST  TECHNIQUE: Multidetector CT imaging of the chest, abdomen and pelvis was performed following the standard protocol during bolus administration of intravenous contrast.  CONTRAST:  160mL ISOVUE-300 IOPAMIDOL (ISOVUE-300) INJECTION 61%  COMPARISON:  PET-CT from 11/10/2015  FINDINGS: CT CHEST FINDINGS  Cardiovascular: Heart size is normal. Aortic atherosclerosis identified. Aortic atherosclerosis noted. Calcification involving the RCA and left circumflex coronary artery  noted.  Mediastinum/Nodes: The trachea appears patent and is midline. Normal appearance of the esophagus. No mediastinal or hilar adenopathy. No axillary or supraclavicular adenopathy.  Lungs/Pleura: No pleural fluid. Moderate to advanced changes of centrilobular emphysema. Stable 5 mm left lower lobe lung nodule, image 38 of series 4. 4 mm right midlung nodule is also unchanged, image number 26 of series 4. There are no new or enlarging nodules/masses.  Musculoskeletal: No aggressive lytic or sclerotic bone lesions.  CT ABDOMEN PELVIS FINDINGS  Hepatobiliary: No focal liver abnormality is seen. No gallstones, gallbladder wall thickening, or biliary dilatation.  Pancreas: Unremarkable. No pancreatic ductal dilatation or surrounding inflammatory  changes.  Spleen: Normal in size without focal abnormality.  Adrenals/Urinary Tract: Adrenal glands are unremarkable. Bilateral renal cortical scarring is identified. No renal calculi, focal lesion, or hydronephrosis. Bladder is unremarkable.  Stomach/Bowel: Gastrostomy tube is in place. Lobulated soft tissue mass associated with the anterior wall of the stomach is again noted. On today's study this measures 2.7 x 4.8 cm, image 50 of series 2. Previously 2.5 x 4.9 cm. Appendix appears normal. No evidence of bowel wall thickening, distention, or inflammatory changes.  Vascular/Lymphatic: No significant vascular findings are present. No enlarged abdominal or pelvic lymph nodes. No upper abdominal or pelvic adenopathy.  Reproductive: Status post hysterectomy. No adnexal masses.  Other: No free fluid or fluid collections.  Musculoskeletal: No aggressive lytic or sclerotic bone lesions.  IMPRESSION: 1. Small nonspecific pulmonary nodules are stable when compared with previous exam. 2. The solid lobulated mass arising from the anterior proximal stomach is un changed in size when compared with previous exam. 3. No new  or progressive findings identified within the chest, abdomen or pelvis. 4. Aortic atherosclerosis and coronary artery calcification.   Electronically Signed   By: Kerby Moors M.D.   On: 02/25/2016 15:59     PATHOLOGY   ASSESSMENT & PLAN: Malignant neoplasm of tonsillar fossa (HCC)   Staging form: Pharynx - Oropharynx, AJCC 7th Edition   - Clinical: Stage IVA (T3, N2c, M0) - Signed by Eppie Gibson, MD on 11/12/2015 Cervical cancer in 1985, treated surgically with a partial hysterectomy. EtOH abuse, still drinking, two 40 oz beer/day Tobacco abuse, still smoking 2 ppd Cocaine abuse  Alcoholic hepatitis Mental retardation GERD  Gastric GIST ARF Hypokalemia  She will be hydrated today, K will be replaced. Have discussed neutropenia/fever with the patient. Have asked nursing to reinforce. We will have her return tomorrow for repeat labs.  I would like to discontinue additional chemotherapy until she has her consultation with radiation oncology. I think at this point given her neuropathy holding additional chemotherapy is very reasonable, I also believe that she has done better than anticipated with compliance. CT imaging from the end of October is reviewed and noted above.   I am not sure of the current cause of her nausea, ARF, but we will hydrate her today. Replace her K, recheck labs in the am. She is afebrile. If she worsens she is instructed to present to the ED. She is not agreeable to admission.  Unfortunately continues to smoke and drink alcohol. We address discontinuation at each visit.   Per Dr. Lanell Persons initial visit:    She will return for formal follow up in 2 weeks with labs.  All questions were answered. The patient knows to call the clinic with any problems, questions or concerns.  This document serves as a record of services personally performed by Ancil Linsey, MD. It was created on her behalf by Arlyce Harman, a trained medical scribe. The creation  of this record is based on the scribe's personal observations and the provider's statements to them. This document has been checked and approved by the attending provider.  I have reviewed the above documentation for accuracy and completeness and I agree with the above.  This note was electronically signed.    Molli Hazard, MD  03/28/2016 9:26 AM

## 2016-03-29 ENCOUNTER — Ambulatory Visit (HOSPITAL_COMMUNITY): Payer: Self-pay

## 2016-04-04 ENCOUNTER — Encounter (HOSPITAL_COMMUNITY): Payer: Medicaid Other

## 2016-04-04 ENCOUNTER — Other Ambulatory Visit (HOSPITAL_COMMUNITY): Payer: Self-pay | Admitting: Oncology

## 2016-04-04 DIAGNOSIS — E876 Hypokalemia: Secondary | ICD-10-CM

## 2016-04-04 DIAGNOSIS — C090 Malignant neoplasm of tonsillar fossa: Secondary | ICD-10-CM

## 2016-04-04 DIAGNOSIS — F1410 Cocaine abuse, uncomplicated: Secondary | ICD-10-CM | POA: Diagnosis not present

## 2016-04-04 LAB — CBC WITH DIFFERENTIAL/PLATELET
BASOS ABS: 0 10*3/uL (ref 0.0–0.1)
Basophils Relative: 0 %
Eosinophils Absolute: 0 10*3/uL (ref 0.0–0.7)
Eosinophils Relative: 0 %
HEMATOCRIT: 28.8 % — AB (ref 36.0–46.0)
HEMOGLOBIN: 10.5 g/dL — AB (ref 12.0–15.0)
LYMPHS ABS: 2.2 10*3/uL (ref 0.7–4.0)
LYMPHS PCT: 49 %
MCH: 36.3 pg — AB (ref 26.0–34.0)
MCHC: 36.5 g/dL — ABNORMAL HIGH (ref 30.0–36.0)
MCV: 99.7 fL (ref 78.0–100.0)
Monocytes Absolute: 0.6 10*3/uL (ref 0.1–1.0)
Monocytes Relative: 12 %
NEUTROS ABS: 1.8 10*3/uL (ref 1.7–7.7)
NEUTROS PCT: 39 %
Platelets: 111 10*3/uL — ABNORMAL LOW (ref 150–400)
RBC: 2.89 MIL/uL — AB (ref 3.87–5.11)
RDW: 17.2 % — ABNORMAL HIGH (ref 11.5–15.5)
WBC: 4.6 10*3/uL (ref 4.0–10.5)

## 2016-04-04 LAB — COMPREHENSIVE METABOLIC PANEL
ALT: 12 U/L — ABNORMAL LOW (ref 14–54)
ANION GAP: 11 (ref 5–15)
AST: 24 U/L (ref 15–41)
Albumin: 3.7 g/dL (ref 3.5–5.0)
Alkaline Phosphatase: 64 U/L (ref 38–126)
BUN: 9 mg/dL (ref 6–20)
CHLORIDE: 93 mmol/L — AB (ref 101–111)
CO2: 23 mmol/L (ref 22–32)
Calcium: 7.2 mg/dL — ABNORMAL LOW (ref 8.9–10.3)
Creatinine, Ser: 1.4 mg/dL — ABNORMAL HIGH (ref 0.44–1.00)
GFR, EST AFRICAN AMERICAN: 47 mL/min — AB (ref >60)
GFR, EST NON AFRICAN AMERICAN: 41 mL/min — AB (ref >60)
Glucose, Bld: 111 mg/dL — ABNORMAL HIGH (ref 65–99)
POTASSIUM: 3.2 mmol/L — AB (ref 3.5–5.1)
Sodium: 127 mmol/L — ABNORMAL LOW (ref 135–145)
Total Bilirubin: 0.2 mg/dL — ABNORMAL LOW (ref 0.3–1.2)
Total Protein: 6.7 g/dL (ref 6.5–8.1)

## 2016-04-04 MED ORDER — POTASSIUM CHLORIDE CRYS ER 20 MEQ PO TBCR: 20.0000 meq | EXTENDED_RELEASE_TABLET | Freq: Two times a day (BID) | ORAL | 1 refills | Status: DC

## 2016-04-05 MED ORDER — HEPARIN SOD (PORK) LOCK FLUSH 100 UNIT/ML IV SOLN
INTRAVENOUS | Status: AC
  Filled 2016-04-05: qty 5

## 2016-04-06 ENCOUNTER — Ambulatory Visit (INDEPENDENT_AMBULATORY_CARE_PROVIDER_SITE_OTHER): Payer: Self-pay | Admitting: Otolaryngology

## 2016-04-12 ENCOUNTER — Encounter (HOSPITAL_COMMUNITY): Payer: Medicaid Other | Attending: Oncology | Admitting: Oncology

## 2016-04-12 ENCOUNTER — Encounter (HOSPITAL_COMMUNITY): Payer: Medicaid Other

## 2016-04-12 ENCOUNTER — Encounter (HOSPITAL_COMMUNITY): Payer: Self-pay | Admitting: Oncology

## 2016-04-12 VITALS — BP 158/83 | HR 85 | Temp 98.7°F | Resp 18 | Ht 62.0 in | Wt 100.6 lb

## 2016-04-12 DIAGNOSIS — D473 Essential (hemorrhagic) thrombocythemia: Secondary | ICD-10-CM | POA: Insufficient documentation

## 2016-04-12 DIAGNOSIS — C090 Malignant neoplasm of tonsillar fossa: Secondary | ICD-10-CM | POA: Insufficient documentation

## 2016-04-12 DIAGNOSIS — F1410 Cocaine abuse, uncomplicated: Secondary | ICD-10-CM | POA: Insufficient documentation

## 2016-04-12 DIAGNOSIS — G620 Drug-induced polyneuropathy: Secondary | ICD-10-CM

## 2016-04-12 DIAGNOSIS — E876 Hypokalemia: Secondary | ICD-10-CM | POA: Insufficient documentation

## 2016-04-12 DIAGNOSIS — T451X5A Adverse effect of antineoplastic and immunosuppressive drugs, initial encounter: Secondary | ICD-10-CM

## 2016-04-12 LAB — CBC WITH DIFFERENTIAL/PLATELET
Basophils Absolute: 0 10*3/uL (ref 0.0–0.1)
Basophils Relative: 0 %
EOS ABS: 0 10*3/uL (ref 0.0–0.7)
EOS PCT: 0 %
HCT: 27.4 % — ABNORMAL LOW (ref 36.0–46.0)
Hemoglobin: 9.7 g/dL — ABNORMAL LOW (ref 12.0–15.0)
LYMPHS ABS: 2.3 10*3/uL (ref 0.7–4.0)
LYMPHS PCT: 48 %
MCH: 36.2 pg — AB (ref 26.0–34.0)
MCHC: 35.4 g/dL (ref 30.0–36.0)
MCV: 102.2 fL — AB (ref 78.0–100.0)
MONO ABS: 0.5 10*3/uL (ref 0.1–1.0)
Monocytes Relative: 10 %
Neutro Abs: 2 10*3/uL (ref 1.7–7.7)
Neutrophils Relative %: 42 %
PLATELETS: 113 10*3/uL — AB (ref 150–400)
RBC: 2.68 MIL/uL — ABNORMAL LOW (ref 3.87–5.11)
RDW: 17.5 % — AB (ref 11.5–15.5)
WBC: 4.9 10*3/uL (ref 4.0–10.5)

## 2016-04-12 LAB — COMPREHENSIVE METABOLIC PANEL
ALBUMIN: 3.8 g/dL (ref 3.5–5.0)
ALT: 11 U/L — AB (ref 14–54)
AST: 23 U/L (ref 15–41)
Alkaline Phosphatase: 62 U/L (ref 38–126)
Anion gap: 12 (ref 5–15)
BILIRUBIN TOTAL: 0.5 mg/dL (ref 0.3–1.2)
BUN: 10 mg/dL (ref 6–20)
CHLORIDE: 94 mmol/L — AB (ref 101–111)
CO2: 24 mmol/L (ref 22–32)
CREATININE: 1.5 mg/dL — AB (ref 0.44–1.00)
Calcium: 7.7 mg/dL — ABNORMAL LOW (ref 8.9–10.3)
GFR calc Af Amer: 44 mL/min — ABNORMAL LOW (ref >60)
GFR, EST NON AFRICAN AMERICAN: 38 mL/min — AB (ref >60)
GLUCOSE: 77 mg/dL (ref 65–99)
POTASSIUM: 3.7 mmol/L (ref 3.5–5.1)
Sodium: 130 mmol/L — ABNORMAL LOW (ref 135–145)
TOTAL PROTEIN: 6.5 g/dL (ref 6.5–8.1)

## 2016-04-12 MED ORDER — DULOXETINE HCL 30 MG PO CPEP: ORAL_CAPSULE | ORAL | 1 refills | Status: DC

## 2016-04-12 MED ORDER — HYDROCODONE-ACETAMINOPHEN 5-325 MG PO TABS: 1.0000 | ORAL_TABLET | ORAL | 0 refills | Status: DC | PRN

## 2016-04-12 NOTE — Patient Instructions (Addendum)
[facility] at [facility] Discharge Instructions  RECOMMENDATIONS MADE BY THE CONSULTANT AND ANY TEST RESULTS WILL BE SENT TO YOUR REFERRING PHYSICIAN.  You were seen today by Kirby Crigler PA-C. Labs drawn today, will call with results. RX given for Hydrocodone and Cymbalta. Follow up in 2-3 weeks.   Thank you for choosing [place] at [facility] to provide your oncology and hematology care.  To afford each patient quality time with our provider, please arrive at least 15 minutes before your scheduled appointment time.   Beginning January 23rd 2017 lab work for the [facility] will be done in the  Main lab at [facility] on 1st floor. If you have a lab appointment with the [facility] please come in thru the  Main Entrance and check in at the main information desk  You need to re-schedule your appointment should you arrive 10 or more minutes late.  We strive to give you quality time with our providers, and arriving late affects you and other patients whose appointments are after yours.  Also, if you no show three or more times for appointments you may be dismissed from the clinic at the providers discretion.     Again, thank you for choosing [facility].  Our hope is that these requests will decrease the amount of time that you wait before being seen by our physicians.       _____________________________________________________________  Should you have questions after your visit to [facility], please contact our office at [phone] between the hours of 8:30 a.m. and 4:30 p.m.  Voicemails left after 4:30 p.m. will not be returned until the following business day.  For prescription refill requests, have your pharmacy contact our office.         Resources For Cancer Patients and their Caregivers ? American Cancer Society: Can assist with transportation, wigs, general needs, runs Look Good Feel Better.         231 745 1886 ? Cancer Care: Provides financial assistance, online support groups, medication/co-pay assistance.  1-800-813-HOPE (734) 432-6616) ? [facility] Assists [place] cancer patients and their families through emotional , educational and financial support.  540-831-0363 ? [place] DSS Where to apply for food stamps, Medicaid and utility assistance. 647-688-6731 ? [facility]: Transportation to medical appointments. 3065013658 ? Social Security Administration: May apply for disability if have a Stage IV cancer. (352)713-8416 859-388-8098 ? [employer], Disability and Transit Services: Assists with nutrition, care and transit needs. [place] Support Programs: @10RELATIVEDAYS @ > Cancer Support Group  2nd Tuesday of the month 1pm-2pm, Journey Room  > Creative Journey  3rd Tuesday of the month 1130am-1pm, Journey Room  > Look Good Feel Better  1st Wednesday of the month 10am-12 noon, Journey Room (Call [facility] to register [phone])

## 2016-04-12 NOTE — Assessment & Plan Note (Addendum)
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  Starting systemic chemotherapy with weekly Cisplatin/Paclitaxel on 12/22/2015.  Complicated by illicit drug abuse.  Treatment on HOLD beginning on 03/22/2016  Oncology history is updated.  Labs today: CBC diff, CMET.   Labs will be drawn on her way out of the clinic.  We will update her on the results accordingly.  I have refilled her pain medication.  She complains of chemotherapy-induced peripheral neuropathy.  I will D/C her Lexapro and start Cymbalta 30 mg x 5 days followed by 60 mg daily thereafter.  She asks about dentures and I recommend this wait until after XRT.  I have asked nursing to call XRT to learn their plan of treatment.  I learned that the patient was referred to Dr. Benjamine Mola by XRT and they are waiting to hear back from Dr. Benjamine Mola in order to formulate XRT plan.   On further chart review, I see that she had an appt with Dr. Benjamine Mola on 04/06/2016.  She was marked as a no-show.  We have advised the patient's daughter for call Dr. Deeann Saint office to reschedule this appointment.  We will hold chemotherapy until there is a plan in place from XRT standpoint due to side effects of previous chemotherapy.  Return in 2-3 weeks for follow-up.

## 2016-04-12 NOTE — Progress Notes (Addendum)
[place], MD [place] [address]  Malignant neoplasm of tonsillar fossa ([place]) - Plan: HYDROcodone-acetaminophen ([place]) 5-325 MG tablet  Peripheral neuropathy due to chemotherapy ([place]) - Plan: DULoxetine (CYMBALTA) 30 MG capsule  CURRENT THERAPY: Treatment on HOLD  INTERVAL HISTORY: Molly Campbell 55 y.o. 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  Starting systemic chemotherapy with weekly Cisplatin/Paclitaxel on 12/22/2015.    Malignant neoplasm of tonsillar fossa ([place])   10/05/2015 Imaging    CT imaging [facility], bilateral palatine tonsil enlargement with asymmetric abnormality of R tonsil and soft palate, possible R cervical adenopathy      10/25/2015 Procedure    Direct laryngoscopy and biopsyof right tonsillar mass.      10/27/2015 Pathology Results    invasive squamous cell carcinoma, p16 NEGATIVE      11/10/2015 PET scan    Bilateral palatine tonsil enlargement and hypermetabolism, with asymmetric hypermetabolism in the right palatine tonsil, consistent with primary malignancy in this location. Bilateral mildly enlarged hypermetabolic level 2 neck lymph nodes.      11/18/2015 Procedure    IR Gastrostomy Tube, fluoroscopic 20 French Pull through Gastrostomy      11/27/2015 Procedure    CT-guided core biopsy performed of the bilobed shaped soft tissue mass anterior to the proximal stomach.      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      12/07/2015 Imaging    NM Myocar multi w spect- Nuclear stress EF: 70%.      12/22/2015 - 03/22/2016 Chemotherapy    Cisplatin/Paclitaxel weekly      02/25/2016 Imaging    CT CAP- 1. Small nonspecific pulmonary nodules are stable when compared with previous exam. 2. The solid lobulated mass arising from the anterior proximal stomach is  un changed in size when compared with previous exam. 3. No new or progressive findings identified within the chest, abdomen or pelvis. 4. Aortic atherosclerosis and coronary artery calcification.      02/25/2016 Imaging    CT neck- History of right tonsil cancer status post surgery. No suspicious enhancement in the surgical region.      03/28/2016 Adverse Reaction    Peripheral neuropathy      03/28/2016 Treatment Plan Change    Chemotherapy on HOLD       She reports issues with eating due to the lack of teeth.  Dentures can be considered following treatment.  She notes significant back pain, this is chronic.  She reports that pain medication is not helping.  No change in pain medication is offered given her history.  She notes numbness/tingling/burning of her hands and feet.  This is from chemotherapy.  She denies improvement at this time.  We discussed treatment options moving forward.  She did see Dr. Benjamine Mola recently.  Review of Systems  Constitutional: Negative.  Negative for chills, fever and weight loss.  HENT: Negative.   Eyes: Negative.   Respiratory: Negative.  Negative for cough.   Cardiovascular: Negative.  Negative for chest pain.  Gastrointestinal: Negative.  Negative for constipation, diarrhea, nausea and vomiting.  Genitourinary: Negative.  Negative for dysuria.  Musculoskeletal: Positive for back pain (chronic).  Skin: Negative.   Neurological: Negative.  Negative for weakness.  Endo/Heme/Allergies: Negative.   Psychiatric/Behavioral: Positive for depression and substance abuse.    Past Medical History:  Diagnosis  Date  . Alcohol abuse   . Alcoholic hepatitis [id number]  . Anemia   . Anxiety disorder   . Asthma   . Auditory hallucinations   . Cancer (HCC)    cervical  . Chronic back pain   . Chronic bronchitis with acute exacerbation ([facility] [facility])   . Cocaine abuse   . Constipation   . COPD (chronic obstructive pulmonary disease) ([facility])   . Depression     . Dysrhythmia 2012   hx AF in er-cocaine-converted back NSR  . Emphysema   . GERD (gastroesophageal reflux disease)   . Headache   . History of cervical cancer 09/26/2013  . Hypertension   . Malignant neoplasm of tonsillar fossa ([facility]) 11/12/2015  . Pneumonia    hx  . Poor circulation   . Schizophrenia ([facility] [place])   . Scoliosis   . Tobacco abuse     Past Surgical History:  Procedure Laterality Date  . ABDOMINAL HYSTERECTOMY    . BREAST BIOPSY  2005   lt bx  . COLONOSCOPY  05/07/2002   Dr.Demason- large approximately 1.5cm polyp pedunculated aproximately 10cm from the anal verge bx= adenomatous polyp  . DIRECT LARYNGOSCOPY N/A 10/25/2015   Procedure: DIRECT LARYNGOSCOPY WITH BIOPSY OF ORAL PHARYNGEAL LESION;  Surgeon: Leta Baptist, MD;  Location: [facility];  Service: ENT;  Laterality: N/A;  DIRECT LARYNGOSCOPY WITH BIOPSY OF ORAL PHARYNGEAL LESION  . ESOPHAGOGASTRODUODENOSCOPY  06/02/2002   Dr. Anthony Sar- mild distal esophagitis w/o stricturing or ulceration. stomach and pylorus are normal. inflammatory changes of proximal duldenum in the first portion.   . ESOPHAGOGASTRODUODENOSCOPY (EGD) WITH PROPOFOL N/A 11/23/2015   Procedure: ESOPHAGOGASTRODUODENOSCOPY (EGD) WITH PROPOFOL;  Surgeon: Danie Binder, MD;  [facility];  Service: Endoscopy;  Laterality: N/A;  1245  . FRACTURE SURGERY  right wrist   [facility]  . MULTIPLE EXTRACTIONS WITH ALVEOLOPLASTY N/A 12/13/2015   Procedure: Extraction of tooth #'s 2,3,4,5,6,12,13,14,15,17,and 18 with alveoloplasty, bilateral maxillary tuberosity reductions, and gross debridement of remaining teeth;  Surgeon: Lenn Cal, DDS;  Location: [facility];  Service: Oral Surgery;  Laterality: N/A;  . PEG PLACEMENT  11/18/15  . PORTACATH PLACEMENT Right 11/18/15    Family History  Problem Relation Age of Onset  . Alzheimer's disease Mother   . Other Father     "he was beat to death"  . Heart disease Sister   . Gout Sister   . COPD  Sister   . Seizures Sister   . Diabetes Sister   . Diabetes Brother   . Cancer Paternal Grandmother     not sure what kind  . Seizures Sister   . Other Sister     "bones are messed up"  . Hypertension Brother   . Heart attack Brother   . Other Brother     MVA  . Diabetes Brother   . Other Brother     MVA  . Alcohol abuse Daughter   . Other Son     motorcycle accident  . Heart failure Other     Social History   Social History  . Marital status: Widowed    Spouse name: N/A  . Number of children: 3  . Years of education: N/A   Social History Main Topics  . Smoking status: Current Every Day Smoker    Packs/day: 1.00    Years: 48.00    Types: Cigarettes  . Smokeless tobacco: Former Systems developer    Types: Snuff     Comment: One  pack a day  . Alcohol use 0.0 oz/week     Comment: 40 oz a day  . Drug use:     Types: Cocaine, Marijuana     Comment: last used on Friday 8/18  . Sexual activity: Not Currently    Birth control/ protection: Surgical   Other Topics Concern  . None   Social History Narrative  . None     PHYSICAL EXAMINATION  ECOG PERFORMANCE STATUS: 1 - Symptomatic but completely ambulatory  Vitals:   04/12/16 1354  BP: (!) 158/83  Pulse: 85  Resp: 18  Temp: 98.7 F (37.1 C)     GENERAL:alert, no distress, well developed, comfortable, cooperative, accompanied by her daughter. SKIN: skin color, texture, turgor are normal, no rashes or significant lesions HEAD: Normocephalic, No masses, lesions, tenderness or abnormalities EYES: normal, EOMI, Conjunctiva are pink and non-injected EARS: External ears normal OROPHARYNX:lips, buccal mucosa, and tongue normal and mucous membranes are moist  NECK: supple, trachea midline LYMPH:  no palpable lymphadenopathy BREAST:not examined LUNGS: clear to auscultation  HEART: regular rate & rhythm ABDOMEN:abdomen soft and normal bowel sounds BACK: Back symmetric, no curvature. EXTREMITIES:less then 2 second capillary  refill, no joint deformities, effusion, or inflammation, no skin discoloration, no cyanosis  NEURO: alert & oriented x 3 with fluent speech, no focal motor/sensory deficits, gait normal  LABORATORY DATA: CBC    Component Value Date/Time   WBC 4.6 04/04/2016 1215   RBC 2.89 (L) 04/04/2016 1215   HGB 10.5 (L) 04/04/2016 1215   HCT 28.8 (L) 04/04/2016 1215   PLT 111 (L) 04/04/2016 1215   MCV 99.7 04/04/2016 1215   MCH 36.3 (H) 04/04/2016 1215   MCHC 36.5 (H) 04/04/2016 1215   RDW 17.2 (H) 04/04/2016 1215   LYMPHSABS 2.2 04/04/2016 1215   MONOABS 0.6 04/04/2016 1215   EOSABS 0.0 04/04/2016 1215   BASOSABS 0.0 04/04/2016 1215      Chemistry      Component Value Date/Time   NA 127 (L) 04/04/2016 1215   K 3.2 (L) 04/04/2016 1215   CL 93 (L) 04/04/2016 1215   CO2 23 04/04/2016 1215   BUN 9 04/04/2016 1215   CREATININE 1.40 (H) 04/04/2016 1215   GLU 99 09/17/2015      Component Value Date/Time   CALCIUM 7.2 (L) 04/04/2016 1215   ALKPHOS 64 04/04/2016 1215   AST 24 04/04/2016 1215   ALT 12 (L) 04/04/2016 1215   BILITOT 0.2 (L) 04/04/2016 1215        PENDING LABS:   RADIOGRAPHIC STUDIES:  No results found.   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  Starting systemic chemotherapy with weekly Cisplatin/Paclitaxel on 12/22/2015.  Complicated by illicit drug abuse.  Treatment on HOLD beginning on 03/22/2016  Oncology history is updated.  Labs today: CBC diff, CMET.   Labs will be drawn on her way out of the clinic.  We will update her on the results accordingly.  I have refilled her pain medication.  She complains of chemotherapy-induced peripheral neuropathy.  I will D/C her Lexapro and start Cymbalta 30 mg x 5 days followed by 60 mg daily thereafter.  She asks about dentures and I recommend this wait until after XRT.  I have asked nursing to call XRT  to learn their plan of treatment.  I learned that the patient was referred to Dr. Benjamine Mola by XRT  and they are waiting to hear back from Dr. Benjamine Mola in order to formulate XRT plan.   On further chart review, I see that she had an appt with Dr. Benjamine Mola on 04/06/2016.  She was marked as a no-show.  We have advised the patient's daughter for call Dr. Deeann Saint office to reschedule this appointment.  We will hold chemotherapy until there is a plan in place from XRT standpoint due to side effects of previous chemotherapy.  Return in 2-3 weeks for follow-up.   ORDERS PLACED FOR THIS ENCOUNTER: No orders of the defined types were placed in this encounter.   MEDICATIONS PRESCRIBED THIS ENCOUNTER: Meds ordered this encounter  Medications  . LORazepam (ATIVAN) 0.5 MG tablet    Sig: Take 0.5 mg by mouth once. Take one tablet before radiation treatments  . HYDROcodone-acetaminophen ([place]) 5-325 MG tablet    Sig: Take 1-2 tablets by mouth every 4 (four) hours as needed for moderate pain or severe pain.    Dispense:  30 tablet    Refill:  0    Order Specific Question:   Supervising Provider    Answer:   Patrici Ranks [id number]  . DULoxetine (CYMBALTA) 30 MG capsule    Sig: Take 30 mg PO daily x 5 days, then take 2 per day PO.    Dispense:  60 capsule    Refill:  1    Order Specific Question:   Supervising Provider    Answer:   Patrici Ranks [id number]    THERAPY PLAN:  Continue treatment with supportive care as outlined above.  All questions were answered. The patient knows to call the clinic with any problems, questions or concerns. We can certainly see the patient much sooner if necessary.  Patient and plan discussed with Dr. Ancil Linsey and she is in agreement with the aforementioned.   This note is electronically signed by: Doy Mince 04/12/2016 2:14 PM

## 2016-04-14 ENCOUNTER — Ambulatory Visit (HOSPITAL_COMMUNITY): Payer: Self-pay

## 2016-04-20 ENCOUNTER — Ambulatory Visit (INDEPENDENT_AMBULATORY_CARE_PROVIDER_SITE_OTHER): Payer: Medicaid Other | Admitting: Otolaryngology

## 2016-04-20 DIAGNOSIS — C090 Malignant neoplasm of tonsillar fossa: Secondary | ICD-10-CM | POA: Diagnosis not present

## 2016-04-21 ENCOUNTER — Other Ambulatory Visit: Payer: Self-pay | Admitting: Otolaryngology

## 2016-04-25 ENCOUNTER — Encounter (HOSPITAL_BASED_OUTPATIENT_CLINIC_OR_DEPARTMENT_OTHER): Payer: Self-pay

## 2016-04-25 ENCOUNTER — Ambulatory Visit (HOSPITAL_BASED_OUTPATIENT_CLINIC_OR_DEPARTMENT_OTHER): Payer: Medicaid Other | Admitting: Anesthesiology

## 2016-04-25 ENCOUNTER — Ambulatory Visit (HOSPITAL_BASED_OUTPATIENT_CLINIC_OR_DEPARTMENT_OTHER)
Admission: RE | Admit: 2016-04-25 | Discharge: 2016-04-25 | Disposition: A | Discharge status: Home / Self Care | Payer: Medicaid Other | Source: Ambulatory Visit | Attending: Otolaryngology | Admitting: Otolaryngology

## 2016-04-25 ENCOUNTER — Encounter (HOSPITAL_BASED_OUTPATIENT_CLINIC_OR_DEPARTMENT_OTHER)
Admission: RE | Disposition: A | Discharge status: Home / Self Care | Payer: Self-pay | Source: Ambulatory Visit | Attending: Otolaryngology

## 2016-04-25 DIAGNOSIS — J351 Hypertrophy of tonsils: Secondary | ICD-10-CM | POA: Diagnosis not present

## 2016-04-25 DIAGNOSIS — R51 Headache: Secondary | ICD-10-CM | POA: Diagnosis not present

## 2016-04-25 DIAGNOSIS — F209 Schizophrenia, unspecified: Secondary | ICD-10-CM | POA: Insufficient documentation

## 2016-04-25 DIAGNOSIS — K219 Gastro-esophageal reflux disease without esophagitis: Secondary | ICD-10-CM | POA: Insufficient documentation

## 2016-04-25 DIAGNOSIS — I4891 Unspecified atrial fibrillation: Secondary | ICD-10-CM | POA: Diagnosis not present

## 2016-04-25 DIAGNOSIS — Z85818 Personal history of malignant neoplasm of other sites of lip, oral cavity, and pharynx: Secondary | ICD-10-CM | POA: Diagnosis present

## 2016-04-25 DIAGNOSIS — I739 Peripheral vascular disease, unspecified: Secondary | ICD-10-CM | POA: Insufficient documentation

## 2016-04-25 DIAGNOSIS — I10 Essential (primary) hypertension: Secondary | ICD-10-CM | POA: Insufficient documentation

## 2016-04-25 DIAGNOSIS — J449 Chronic obstructive pulmonary disease, unspecified: Secondary | ICD-10-CM | POA: Insufficient documentation

## 2016-04-25 HISTORY — PX: TONSILLECTOMY: SHX5217

## 2016-04-25 SURGERY — TONSILLECTOMY
Anesthesia: General | Laterality: Left

## 2016-04-25 MED ORDER — ONDANSETRON HCL 4 MG/2ML IJ SOLN
INTRAMUSCULAR | Status: DC | PRN
Start: 2016-04-25 — End: 2016-04-25
  Administered 2016-04-25: 4 mg via INTRAVENOUS

## 2016-04-25 MED ORDER — LIDOCAINE HCL (CARDIAC) 20 MG/ML IV SOLN
INTRAVENOUS | Status: DC | PRN
  Administered 2016-04-25: 40 mg via INTRAVENOUS

## 2016-04-25 MED ORDER — ONDANSETRON HCL 4 MG/2ML IJ SOLN: 4.0000 mg | Freq: Once | INTRAMUSCULAR | Status: DC | PRN

## 2016-04-25 MED ORDER — FENTANYL CITRATE (PF) 100 MCG/2ML IJ SOLN
50.0000 ug | INTRAMUSCULAR | Status: AC | PRN
  Administered 2016-04-25 (×4): 25 ug via INTRAVENOUS

## 2016-04-25 MED ORDER — BACITRACIN 500 UNIT/GM EX OINT
TOPICAL_OINTMENT | CUTANEOUS | Status: DC | PRN
  Administered 2016-04-25: 1 via TOPICAL

## 2016-04-25 MED ORDER — MIDAZOLAM HCL 2 MG/2ML IJ SOLN
INTRAMUSCULAR | Status: AC
  Filled 2016-04-25: qty 2

## 2016-04-25 MED ORDER — DEXAMETHASONE SODIUM PHOSPHATE 4 MG/ML IJ SOLN
INTRAMUSCULAR | Status: DC | PRN
  Administered 2016-04-25: 10 mg via INTRAVENOUS

## 2016-04-25 MED ORDER — SCOPOLAMINE 1 MG/3DAYS TD PT72: 1.0000 | MEDICATED_PATCH | Freq: Once | TRANSDERMAL | Status: DC | PRN

## 2016-04-25 MED ORDER — LIDOCAINE 2% (20 MG/ML) 5 ML SYRINGE
INTRAMUSCULAR | Status: AC
  Filled 2016-04-25: qty 5

## 2016-04-25 MED ORDER — PROPOFOL 10 MG/ML IV BOLUS
INTRAVENOUS | Status: DC | PRN
  Administered 2016-04-25: 100 mg via INTRAVENOUS

## 2016-04-25 MED ORDER — LACTATED RINGERS IV SOLN
INTRAVENOUS | Status: DC
  Administered 2016-04-25: 12:00:00 via INTRAVENOUS

## 2016-04-25 MED ORDER — ONDANSETRON HCL 4 MG/2ML IJ SOLN
INTRAMUSCULAR | Status: AC
  Filled 2016-04-25: qty 2

## 2016-04-25 MED ORDER — FENTANYL CITRATE (PF) 100 MCG/2ML IJ SOLN
25.0000 ug | INTRAMUSCULAR | Status: DC | PRN
  Administered 2016-04-25: 25 ug via INTRAVENOUS

## 2016-04-25 MED ORDER — SUCCINYLCHOLINE CHLORIDE 20 MG/ML IJ SOLN
INTRAMUSCULAR | Status: DC | PRN
  Administered 2016-04-25: 60 mg via INTRAVENOUS

## 2016-04-25 MED ORDER — MIDAZOLAM HCL 2 MG/2ML IJ SOLN: 1.0000 mg | INTRAMUSCULAR | Status: DC | PRN

## 2016-04-25 MED ORDER — FENTANYL CITRATE (PF) 100 MCG/2ML IJ SOLN
INTRAMUSCULAR | Status: AC
  Filled 2016-04-25: qty 2

## 2016-04-25 MED ORDER — OXYCODONE HCL 5 MG/5ML PO SOLN: 5.0000 mg | ORAL | 0 refills | Status: DC | PRN

## 2016-04-25 MED ORDER — SODIUM CHLORIDE 0.9 % IR SOLN
Status: DC | PRN
  Administered 2016-04-25: 1

## 2016-04-25 MED ORDER — AMOXICILLIN 400 MG/5ML PO SUSR: 800.0000 mg | Freq: Two times a day (BID) | ORAL | 0 refills | Status: AC

## 2016-04-25 MED ORDER — DEXAMETHASONE SODIUM PHOSPHATE 10 MG/ML IJ SOLN
INTRAMUSCULAR | Status: AC
  Filled 2016-04-25: qty 1

## 2016-04-25 SURGICAL SUPPLY — 33 items
BANDAGE COBAN STERILE 2 (GAUZE/BANDAGES/DRESSINGS) IMPLANT
CANISTER SUCT 1200ML W/VALVE (MISCELLANEOUS) ×3 IMPLANT
CATH ROBINSON RED A/P 10FR (CATHETERS) IMPLANT
CATH ROBINSON RED A/P 14FR (CATHETERS) ×2 IMPLANT
COAGULATOR SUCT 6 FR SWTCH (ELECTROSURGICAL)
COAGULATOR SUCT SWTCH 10FR 6 (ELECTROSURGICAL) IMPLANT
COVER MAYO STAND STRL (DRAPES) ×3 IMPLANT
ELECT REM PT RETURN 9FT ADLT (ELECTROSURGICAL)
ELECT REM PT RETURN 9FT PED (ELECTROSURGICAL)
ELECTRODE REM PT RETRN 9FT PED (ELECTROSURGICAL) IMPLANT
ELECTRODE REM PT RTRN 9FT ADLT (ELECTROSURGICAL) IMPLANT
GLOVE BIO SURGEON STRL SZ7.5 (GLOVE) ×3 IMPLANT
GLOVE BIOGEL PI IND STRL 7.0 (GLOVE) IMPLANT
GLOVE BIOGEL PI INDICATOR 7.0 (GLOVE) ×2
GLOVE SURG SS PI 7.5 STRL IVOR (GLOVE) ×2 IMPLANT
GOWN STRL REUS W/ TWL LRG LVL3 (GOWN DISPOSABLE) ×2 IMPLANT
GOWN STRL REUS W/TWL LRG LVL3 (GOWN DISPOSABLE) ×6
IV NS 500ML (IV SOLUTION) ×3
IV NS 500ML BAXH (IV SOLUTION) ×1 IMPLANT
MARKER SKIN DUAL TIP RULER LAB (MISCELLANEOUS) IMPLANT
NS IRRIG 1000ML POUR BTL (IV SOLUTION) IMPLANT
SHEET MEDIUM DRAPE 40X70 STRL (DRAPES) ×3 IMPLANT
SOLUTION BUTLER CLEAR DIP (MISCELLANEOUS) ×3 IMPLANT
SPONGE GAUZE 4X4 12PLY STER LF (GAUZE/BANDAGES/DRESSINGS) ×3 IMPLANT
SPONGE TONSIL 1 RF SGL (DISPOSABLE) IMPLANT
SPONGE TONSIL 1.25 RF SGL STRG (GAUZE/BANDAGES/DRESSINGS) IMPLANT
SYR BULB 3OZ (MISCELLANEOUS) IMPLANT
TOWEL OR 17X24 6PK STRL BLUE (TOWEL DISPOSABLE) ×3 IMPLANT
TUBE CONNECTING 20'X1/4 (TUBING) ×1
TUBE CONNECTING 20X1/4 (TUBING) ×2 IMPLANT
TUBE SALEM SUMP 12R W/ARV (TUBING) IMPLANT
TUBE SALEM SUMP 16 FR W/ARV (TUBING) ×2 IMPLANT
WAND COBLATOR 70 EVAC XTRA (SURGICAL WAND) ×2 IMPLANT

## 2016-04-25 NOTE — Anesthesia Preprocedure Evaluation (Signed)
Anesthesia Evaluation  Patient identified by MRN, date of birth, ID band Patient awake    Reviewed: Allergy & Precautions, NPO status , Patient's Chart, lab work & pertinent test results, reviewed documented beta blocker date and time   Airway Mallampati: II  TM Distance: >3 FB Neck ROM: Full    Dental  (+) Partial Upper, Partial Lower, Dental Advisory Given, Missing   Pulmonary asthma , COPD,  COPD inhaler, Current Smoker,    Pulmonary exam normal        Cardiovascular hypertension, Pt. on medications + Peripheral Vascular Disease  Normal cardiovascular exam+ dysrhythmias Atrial Fibrillation  Rhythm:Regular Rate:Normal     Neuro/Psych  Headaches, PSYCHIATRIC DISORDERS Depression Schizophrenia    GI/Hepatic GERD  Medicated and Controlled,(+)     substance abuse  cocaine use, Hepatitis -  Endo/Other    Renal/GU      Musculoskeletal   Abdominal   Peds  Hematology   Anesthesia Other Findings   Reproductive/Obstetrics                             Anesthesia Physical  Anesthesia Plan  ASA: III  Anesthesia Plan: General   Post-op Pain Management:    Induction: Intravenous  Airway Management Planned: Nasal ETT  Additional Equipment:   Intra-op Plan:   Post-operative Plan: Extubation in OR  Informed Consent: I have reviewed the patients History and Physical, chart, labs and discussed the procedure including the risks, benefits and alternatives for the proposed anesthesia with the patient or authorized representative who has indicated his/her understanding and acceptance.     Plan Discussed with: CRNA and Surgeon  Anesthesia Plan Comments:         Anesthesia Quick Evaluation

## 2016-04-25 NOTE — Anesthesia Procedure Notes (Signed)
Procedure Name: Intubation Date/Time: 04/25/2016 11:50 AM Performed by: Lieutenant Diego Pre-anesthesia Checklist: Patient identified, Emergency Drugs available, Suction available and Patient being monitored Patient Re-evaluated:Patient Re-evaluated prior to inductionOxygen Delivery Method: Circle system utilized Preoxygenation: Pre-oxygenation with 100% oxygen Intubation Type: IV induction Ventilation: Mask ventilation without difficulty Laryngoscope Size: Miller and 2 Grade View: Grade I Tube type: Oral Tube size: 7.0 mm Number of attempts: 1 Airway Equipment and Method: Stylet and Oral airway Placement Confirmation: ETT inserted through vocal cords under direct vision,  positive ETCO2 and breath sounds checked- equal and bilateral Secured at: 22 cm Tube secured with: Tape Dental Injury: Teeth and Oropharynx as per pre-operative assessment

## 2016-04-25 NOTE — Anesthesia Postprocedure Evaluation (Addendum)
Anesthesia Post Note  Patient: Molly Campbell  Procedure(s) Performed: Procedure(s) (LRB): LEFT SIDE TONSILLECTOMY (Left)  Patient location during evaluation: PACU Anesthesia Type: General Level of consciousness: awake and alert Pain management: pain level controlled Vital Signs Assessment: post-procedure vital signs reviewed and stable Respiratory status: spontaneous breathing, nonlabored ventilation, respiratory function stable and patient connected to nasal cannula oxygen Cardiovascular status: blood pressure returned to baseline and stable Postop Assessment: no signs of nausea or vomiting Anesthetic complications: no       Last Vitals:  Vitals:   04/25/16 1315 04/25/16 1349  BP: (!) 151/88 (!) 146/96  Pulse: 79 79  Resp: 17 18  Temp:  36.4 C    Last Pain:  Vitals:   04/25/16 1349  TempSrc:   PainSc: 0-No pain                 Catalina Gravel

## 2016-04-25 NOTE — Discharge Instructions (Addendum)
[facility] M[facility] Postoperative Instructions for Tonsillectomy Activity Restrict activity at home for the first two days, resting as much as possible. Light indoor activity is best. You may usually return to school or work within a week but void strenuous activity and sports for two weeks. Sleep with your head elevated on 2-3 pillows for 3-4 days to help decrease swelling. Diet Due to tissue swelling and throat discomfort, you may have little desire to drink for several days. However fluids are very important to prevent dehydration. You will find that non-acidic juices, soups, popsicles, Jell-O, custard, puddings, and any soft or mashed foods taken in small quantities can be swallowed fairly easily. Try to increase your fluid and food intake as the discomfort subsides. It is recommended that a child receive 1-1/2 quarts of fluid in a 24-hour period. Adult require twice this amount.  Discomfort Your sore throat may be relieved by applying an ice collar to your neck and/or by taking Tylenol. You may experience an earache, which is due to referred pain from the throat. Referred ear pain is commonly felt at night when trying to rest.  Bleeding                        Although rare, there is risk of having some bleeding during the first 2 weeks after having a T&A. This usually happens between days 7-10 postoperatively. If you or your child should have any bleeding, try to remain calm. We recommend sitting up quietly in a chair and gently spitting out the blood into a bowl. For adults, gargling gently with ice water may help. If the bleeding does not stop after a short time (5 minutes), is more than 1 teaspoonful, or if you become worried, please call our office at [phone] or go directly to the nearest hospital emergency room. Do not eat or drink anything prior to going to the hospital as you may need to be taken to the operating room in order to control the bleeding. GENERAL  CONSIDERATIONS 1. Brush your teeth regularly. Avoid mouthwashes and gargles for three weeks. You may gargle gently with warm salt-water as necessary or spray with Chloraseptic. You may make salt-water by placing 2 teaspoons of table salt into a quart of fresh water. Warm the salt-water in a microwave to a luke warm temperature.  2. Avoid exposure to colds and upper respiratory infections if possible.  3. If you look into a mirror or into your child's mouth, you will see white-gray patches in the back of the throat. This is normal after having a T&A and is like a scab that forms on the skin after an abrasion. It will disappear once the back of the throat heals completely. However, it may cause a noticeable odor; this too will disappear with time. Again, warm salt-water gargles may be used to help keep the throat clean and promote healing.  4. You may notice a temporary change in voice quality, such as a higher pitched voice or a nasal sound, until healing is complete. This may last for 1-2 weeks and should resolve.  5. Do not take or give you child any medications that we have not prescribed or recommended.  6. Snoring may occur, especially at night, for the first week after a T&A. It is due to swelling of the soft palate and will usually resolve.  Please call our office at [phone] if you have any questions.  Post Anesthesia Home Care Instructions  Activity: Get plenty of rest for the remainder of the day. A responsible adult should stay with you for 24 hours following the procedure.  For the next 24 hours, DO NOT: -Drive a car -Paediatric nurse -Drink alcoholic beverages -Take any medication unless instructed by your physician -Make any legal decisions or sign important papers.  Meals: Start with liquid foods such as gelatin or soup. Progress to regular foods as tolerated. Avoid greasy, spicy, heavy foods. If nausea and/or vomiting occur, drink only clear liquids until the  nausea and/or vomiting subsides. Call your physician if vomiting continues.  Special Instructions/Symptoms: Your throat may feel dry or sore from the anesthesia or the breathing tube placed in your throat during surgery. If this causes discomfort, gargle with warm salt water. The discomfort should disappear within 24 hours.  If you had a scopolamine patch placed behind your ear for the management of post- operative nausea and/or vomiting:  1. The medication in the patch is effective for 72 hours, after which it should be removed.  Wrap patch in a tissue and discard in the trash. Wash hands thoroughly with soap and water. 2. You may remove the patch earlier than 72 hours if you experience unpleasant side effects which may include dry mouth, dizziness or visual disturbances. 3. Avoid touching the patch. Wash your hands with soap and water after contact with the patch.

## 2016-04-25 NOTE — Op Note (Signed)
DATE OF PROCEDURE:  04/25/2016                              OPERATIVE REPORT  SURGEON:  Leta Baptist, MD  PREOPERATIVE DIAGNOSES: 1. Left tonsillar enlargement. 2. History of right tonsillar squamous cell carcinoma  POSTOPERATIVE DIAGNOSES: 1. Left tonsillar enlargement. 2. History of right tonsillar squamous cell carcinoma  PROCEDURE PERFORMED:  Left tonsillectomy.  ANESTHESIA:  General endotracheal tube anesthesia.  COMPLICATIONS:  None.  ESTIMATED BLOOD LOSS:  Minimal.  INDICATION FOR PROCEDURE:  Molly Campbell is a 55 y.o. female with a history of a Stage IV squamous cell carcinoma of the right tonsillar fossa.  She previously underwent direct laryngoscopy and biopsy of the right tonsillar cancer.  She was started on systemic chemotherapy with cisplatin and paclitaxel on 12/22/2015.  The patient was recently evaluated by her radiation oncologist. She was noted to have significant enlargement of the left tonsil.  There was concern that the left tonsil may also have a malignancy.  Based on the above findings, the decision was made to proceed with the left tonsillectomy procedure. The risks, benefits, alternatives, and details of the procedure were discussed with the patient.  Questions were invited and answered.  Informed consent was obtained.  DESCRIPTION:  The patient was taken to the operating room and placed supine on the operating table.  General endotracheal tube anesthesia was administered by the anesthesiologist.  The patient was positioned and prepped and draped in a standard fashion for adenotonsillectomy.  A Crowe-Davis mouth gag was inserted into the oral cavity for exposure. 2+  tonsil was noted on the left. No significant tonsillar tissue was noted on the right. The left tonsil was grasped with a straight Allis clamp and retracted medially.  It was resected free from the underlying pharyngeal constrictor muscles with the Coblator device. The specimen was sent to the pathologist  for histologic identification.  The mouth gag was removed.  The care of the patient was turned over to the anesthesiologist.  The patient was awakened from anesthesia without difficulty.  The patient was extubated and transferred to the recovery room in good condition.  OPERATIVE FINDINGS:  Left tonsillar hypertrophy.  SPECIMEN:  Left tonsil.  FOLLOWUP CARE:  The patient will be discharged home once awake and alert.  She will be placed on amoxicillin 800 mg p.o. b.i.d. for 5 days, and oxycodone 5-89ml po q 4 hours for postop pain control.   The patient will follow up in my office in approximately 2 weeks.  [name] 04/25/2016 12:14 PM

## 2016-04-25 NOTE — H&P (Signed)
Cc: Left tonsillar enlargement, possible cancer  HPI: The patient is a 55 year old female who returns today for evaluation of her pharyngeal squamous cell carcinoma.  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  She previously underwent direct laryngoscopy and biopsy of the right tonsillar cancer.  She was started on systemic chemotherapy with cisplatin and paclitaxel on 12/22/2015.  The patient was recently evaluated by her radiation oncologist. She was noted to have significant enlargement of the left tonsil.  There was concern that the left tonsil may also have a malignancy.  The patient is referred back for evaluation of her left tonsil.  According to the patient, she continues to have intermittent sore throat.  Her chemotherapy and radiation therapy are both on hold pending the evaluation of her left tonsil.    Exam General: Communicates without difficulty, well nourished, no acute distress. Head: Normocephalic, no evidence injury, no tenderness, facial buttresses intact without stepoff. Face/sinus: No tenderness to palpation and percussion. Facial movement is normal and symmetric. Eyes: PERRL, EOMI. No scleral icterus, conjunctivae clear. Neuro: CN II exam reveals vision grossly intact.  No nystagmus at any point of gaze. Ears: Auricles well formed without lesions.  Ear canals are intact without mass or lesion.  No erythema or edema is appreciated.  The TMs are intact without fluid. Nose: External evaluation reveals normal support and skin without lesions.  Dorsum is intact.  Anterior rhinoscopy reveals healthy pink mucosa over anterior aspect of inferior turbinates and intact septum.  No purulence noted. Oral:  Oral cavity and oropharynx are intact, symmetric, without erythema or edema.  Mucosa is moist without lesions. Moderately enlarged left tonsil.  However, no obvious ulceration or erythema is noted today.  Neck: Full range of motion  without pain.  There is no significant lymphadenopathy.  No masses palpable.  Thyroid bed within normal limits to palpation.  Parotid glands and submandibular glands equal bilaterally without mass.  Trachea is midline. Neuro:  CN 2-12 grossly intact. Gait normal.   Assessment  1.  Moderately enlarged left tonsil.  However, no obvious ulceration or erythema is noted today.   2.  No other suspicious mass or lesion is noted on today's laryngoscopy examination.   Plan  1.  The physical exam and laryngoscopy findings are reviewed with the patient.  2.  In light of the concern the patient may have malignancy on her left tonsil; we will proceed with the left tonsillectomy procedure.  The risks, benefits, alternatives and details of the procedure are reviewed with the patient.   3.  The patient would like to proceed with the procedure.

## 2016-04-25 NOTE — Transfer of Care (Signed)
Immediate Anesthesia Transfer of Care Note  Patient: Molly Campbell  Procedure(s) Performed: Procedure(s): LEFT SIDE TONSILLECTOMY (Left)  Patient Location: PACU  Anesthesia Type:General  Level of Consciousness: awake and alert   Airway & Oxygen Therapy: Patient Spontanous Breathing and Patient connected to face mask oxygen  Post-op Assessment: Report given to RN and Post -op Vital signs reviewed and stable  Post vital signs: Reviewed and stable  Last Vitals:  Vitals:   04/25/16 1113 04/25/16 1219  BP: 124/76 127/72  Pulse: 72 (P) 85  Resp: 18 (!) 22  Temp: 36.4 C (P) 36.3 C    Last Pain:  Vitals:   04/25/16 1113  TempSrc: Oral  PainSc: 3       Patients Stated Pain Goal: 1 ([id number])  Complications: No apparent anesthesia complications

## 2016-04-26 ENCOUNTER — Encounter (HOSPITAL_BASED_OUTPATIENT_CLINIC_OR_DEPARTMENT_OTHER): Payer: Self-pay | Admitting: Otolaryngology

## 2016-04-28 NOTE — Progress Notes (Signed)
This encounter was created in error - please disregard.

## 2016-05-03 ENCOUNTER — Encounter (HOSPITAL_COMMUNITY): Payer: Medicaid Other | Admitting: Hematology & Oncology

## 2016-05-03 ENCOUNTER — Other Ambulatory Visit (HOSPITAL_COMMUNITY): Payer: Self-pay | Admitting: Oncology

## 2016-05-03 ENCOUNTER — Telehealth (HOSPITAL_COMMUNITY): Payer: Self-pay | Admitting: *Deleted

## 2016-05-03 DIAGNOSIS — C090 Malignant neoplasm of tonsillar fossa: Secondary | ICD-10-CM

## 2016-05-03 MED ORDER — HYDROCODONE-ACETAMINOPHEN 5-325 MG PO TABS: 1.0000 | ORAL_TABLET | ORAL | 0 refills | Status: DC | PRN

## 2016-05-03 NOTE — Progress Notes (Signed)
Patient on plan of care prior to pathways. 

## 2016-05-03 NOTE — Telephone Encounter (Signed)
Printed.  TK 

## 2016-05-04 NOTE — Progress Notes (Signed)
This encounter was created in error - please disregard.

## 2016-05-11 ENCOUNTER — Ambulatory Visit (INDEPENDENT_AMBULATORY_CARE_PROVIDER_SITE_OTHER): Payer: Medicaid Other | Admitting: Otolaryngology

## 2016-05-12 ENCOUNTER — Ambulatory Visit (HOSPITAL_COMMUNITY): Payer: Self-pay | Admitting: Oncology

## 2016-05-18 ENCOUNTER — Other Ambulatory Visit (HOSPITAL_COMMUNITY): Payer: Self-pay | Admitting: Oncology

## 2016-05-18 DIAGNOSIS — C090 Malignant neoplasm of tonsillar fossa: Secondary | ICD-10-CM

## 2016-05-18 MED ORDER — HYDROCODONE-ACETAMINOPHEN 5-325 MG PO TABS: 1.0000 | ORAL_TABLET | ORAL | 0 refills | Status: DC | PRN

## 2016-05-23 ENCOUNTER — Encounter (HOSPITAL_COMMUNITY): Payer: Medicaid Other | Attending: Oncology | Admitting: Oncology

## 2016-05-23 ENCOUNTER — Encounter (HOSPITAL_COMMUNITY): Payer: Self-pay | Admitting: Oncology

## 2016-05-23 ENCOUNTER — Encounter: Payer: Self-pay | Admitting: Dietician

## 2016-05-23 DIAGNOSIS — G620 Drug-induced polyneuropathy: Secondary | ICD-10-CM | POA: Diagnosis not present

## 2016-05-23 DIAGNOSIS — C090 Malignant neoplasm of tonsillar fossa: Secondary | ICD-10-CM | POA: Insufficient documentation

## 2016-05-23 DIAGNOSIS — F1410 Cocaine abuse, uncomplicated: Secondary | ICD-10-CM | POA: Insufficient documentation

## 2016-05-23 DIAGNOSIS — R197 Diarrhea, unspecified: Secondary | ICD-10-CM | POA: Diagnosis not present

## 2016-05-23 DIAGNOSIS — D473 Essential (hemorrhagic) thrombocythemia: Secondary | ICD-10-CM | POA: Insufficient documentation

## 2016-05-23 DIAGNOSIS — E876 Hypokalemia: Secondary | ICD-10-CM | POA: Insufficient documentation

## 2016-05-23 NOTE — Assessment & Plan Note (Addendum)
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  Starting systemic chemotherapy with weekly Cisplatin/Paclitaxel on 12/22/2015.  Complicated by illicit drug abuse.  Treatment on HOLD beginning on 03/22/2016.  Now there are plans for XRT beginning on 1/24 or 06/01/2016.  Oncology history is updated.  No oncology role for labs today.  She is a poor candidate for Cisplatin chemotherapy with XRT.  Until she improves from a performance status standpoint and compliance, we will hold off on any concomitant chemotherapy.  I fear that once she starts experiencing side effects of radiation, her compliance will worsen.  She is losing weight and at this time, not having issues with swallowing.  She is not using her PEG for nutrition as recommended by RD.  Patient seen by RD, Burtis Junes today.  He will change her formulation to Jevity for her complaint of diarrhea.  He agrees that this may be futile.  She is given an Imodium sheet today for her diarrhea.  I have refilled her pain medication today.  She complains of chemotherapy-induced peripheral neuropathy.  She reports intolerance to Cymbalta.  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  I will plan on urine drug screening her in the future.  She is advised to refrain from illicit drug abuse.  I am afraid that she will not be able to stop.  This is likely contributing to her weight loss as well.  I hinted at Hospice today and she was not interested in this part of our conversation.  She was advised that if that time came, she would be able to stay at home with help.  Return in 2 weeks for follow-up.

## 2016-05-23 NOTE — Progress Notes (Signed)
F/U with high risk H&N patient w/ PEG.   55y/o female PMHx :Tobacco abuse (48 pack year), severe etoh abuse (6 pack or 40 oz daily), illicit drug abuse,HTN, alcoholic hepatitis, COPD,Cervical cancer,GERD, anxiety,schizophrenia,mental retardation(2014)Esophageal Dysphagia (2015)  Contacted Pt by visiting during office visit  Wt Readings from Last 10 Encounters:  05/23/16 96 lb (43.5 kg)  04/25/16 98 lb 3.2 oz (44.5 kg)  04/12/16 100 lb 9.6 oz (45.6 kg)  03/28/16 100 lb 3.2 oz (45.5 kg)  03/22/16 100 lb 3.2 oz (45.5 kg)  03/14/16 102 lb 12.8 oz (46.6 kg)  03/03/16 104 lb 3.2 oz (47.3 kg)  02/24/16 105 lb 9.6 oz (47.9 kg)  02/18/16 103 lb 6.4 oz (46.9 kg)  02/09/16 106 lb (48.1 kg)   Patient weight has decreased by slightly over two lbs in the last month.   Pt reports she hasn't been feeling that good.   She says she hasnt been able to eat well because of nausea. She will eat and then less than 10 minutes later she will vomit. Apparently some days this isnt as bad because she states she occasionally has days where she eats very well.   Due to her lack of teeth/trouble chewing she has been eating soft items. Unfortunately, she has been eating a lot of low kcal items such as oranges and cooked greens. Some better options she has been consuming are chicken noodle soup, yogurt, icecream. She can not eat meats. She does best with liquids. She adds salt to her water. Asked if this was due to sore throat and she said it "tastes better". Doing this may increase risk of dehydration.   When asked if she has been using her PEG tube, she is very ambiguous. She responded yes when RD stated that it sounded like she was infusing 0-1 cans per day.  She now says the tube feeding has been given her diarrhea. This is odd as she has been receiving this tube feed for a long time and has not had this issue before.   RD stated that we could try switching her formula to Jevity, to potentially bind some  of the water. She was open to this idea. RD stated that changing the formula wont do any good if she isnt going to use the formula. She is to have radiation soon and RD again reiterated she would very likely not be able to eat orally. She said she would increase the amount she infuses  PA informed RD at a later time that she had admitted to using crack again. This has likely played a large role in why she hasnt been using her tube feed, missing appointments and subsequently losing weight.   Left my coupons for her.   RD will speak with [facility] regarding changing the tube feeding.    Burtis Junes RD, LDN, [facility] Pager: [phone] 05/23/2016 2:24 PM

## 2016-05-23 NOTE — Patient Instructions (Addendum)
[place] at [facility] Discharge Instructions  RECOMMENDATIONS MADE BY THE CONSULTANT AND ANY TEST RESULTS WILL BE SENT TO YOUR REFERRING PHYSICIAN.  You were seen today by Kirby Crigler PA-C. Rx for pain medicine given today.  Return in 2 weeks for follow up.    Thank you for choosing [place] at [facility] to provide your oncology and hematology care.  To afford each patient quality time with our provider, please arrive at least 15 minutes before your scheduled appointment time.    If you have a lab appointment with the [facility] please come in thru the  Main Entrance and check in at the main information desk  You need to re-schedule your appointment should you arrive 10 or more minutes late.  We strive to give you quality time with our providers, and arriving late affects you and other patients whose appointments are after yours.  Also, if you no show three or more times for appointments you may be dismissed from the clinic at the providers discretion.     Again, thank you for choosing [facility].  Our hope is that these requests will decrease the amount of time that you wait before being seen by our physicians.       _____________________________________________________________  Should you have questions after your visit to [facility], please contact our office at [phone] between the hours of 8:30 a.m. and 4:30 p.m.  Voicemails left after 4:30 p.m. will not be returned until the following business day.  For prescription refill requests, have your pharmacy contact our office.       Resources For Cancer Patients and their Caregivers ? American Cancer Society: Can assist with transportation, wigs, general needs, runs Look Good Feel Better.        530-881-1282 ? Cancer Care: Provides financial assistance, online support groups, medication/co-pay assistance.  1-800-813-HOPE (718)720-5914) ? [facility] Assists [place] cancer patients and their families through emotional , educational and financial support.  213-165-0064 ? [place] DSS Where to apply for food stamps, Medicaid and utility assistance. 802-321-7946 ? [facility]: Transportation to medical appointments. 567-693-9857 ? Social Security Administration: May apply for disability if have a Stage IV cancer. 782 815 4052 (503) 618-4334 ? [employer], Disability and Transit Services: Assists with nutrition, care and transit needs. [facility] Support Programs: @10RELATIVEDAYS @ > Cancer Support Group  2nd Tuesday of the month 1pm-2pm, Journey Room  > Creative Journey  3rd Tuesday of the month 1130am-1pm, Journey Room  > Look Good Feel Better  1st Wednesday of the month 10am-12 noon, Journey Room (Call [place] to register [phone])

## 2016-05-23 NOTE — Progress Notes (Signed)
Molly Fire, MD [address] [place] [address]  Malignant neoplasm of tonsillar fossa [facility])  CURRENT THERAPY: Treatment on HOLD  INTERVAL HISTORY: Molly Campbell 56 y.o. 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  Starting systemic chemotherapy with weekly Cisplatin/Paclitaxel on 12/22/2015- 03/28/2016.    Malignant neoplasm of tonsillar fossa ([facility])   10/05/2015 Imaging    CT imaging [facility], bilateral palatine tonsil enlargement with asymmetric abnormality of R tonsil and soft palate, possible R cervical adenopathy      10/25/2015 Procedure    Direct laryngoscopy and biopsyof right tonsillar mass.      10/27/2015 Pathology Results    invasive squamous cell carcinoma, p16 NEGATIVE      11/10/2015 PET scan    Bilateral palatine tonsil enlargement and hypermetabolism, with asymmetric hypermetabolism in the right palatine tonsil, consistent with primary malignancy in this location. Bilateral mildly enlarged hypermetabolic level 2 neck lymph nodes.      11/18/2015 Procedure    IR Gastrostomy Tube, fluoroscopic 20 French Pull through Gastrostomy      11/27/2015 Procedure    CT-guided core biopsy performed of the bilobed shaped soft tissue mass anterior to the proximal stomach.      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      12/07/2015 Imaging    NM Myocar multi w spect- Nuclear stress EF: 70%.      12/22/2015 - 03/22/2016 Chemotherapy    Cisplatin/Paclitaxel weekly      02/25/2016 Imaging    CT CAP- 1. Small nonspecific pulmonary nodules are stable when compared with previous exam. 2. The solid lobulated mass arising from the anterior proximal stomach is un changed in size when compared with previous exam. 3. No new or progressive findings identified within the chest, abdomen or pelvis. 4.  Aortic atherosclerosis and coronary artery calcification.      02/25/2016 Imaging    CT neck- History of right tonsil cancer status post surgery. No suspicious enhancement in the surgical region.      03/28/2016 Adverse Reaction    Peripheral neuropathy      03/28/2016 Treatment Plan Change    Chemotherapy on HOLD      She is seen by RD today, Burtis Junes.  She has lost an additional 2 lbs and she has not been on any anti-neoplastic therapy since November 2017.  She reports diarrhea with her PEG nutrition.  Ovid Curd will change formulation to Jevity.   She is only using 1 can daily.  She is planned to start XRT next week, Wednesday/Thursday.  Compliance is an ongoing issue with this patient.  She admits to being back on crack.  This is likely contributing to her weight loss.  Review of Systems  Constitutional: Positive for weight loss. Negative for chills and fever.  HENT: Negative.   Respiratory: Negative.  Negative for cough.   Cardiovascular: Negative.  Negative for chest pain.  Gastrointestinal: Positive for diarrhea and nausea. Negative for constipation and vomiting.  Genitourinary: Negative.   Musculoskeletal: Negative.   Skin: Negative.   Neurological: Negative.  Negative for weakness.  Endo/Heme/Allergies: Negative.   Psychiatric/Behavioral: Negative.     Past Medical History:  Diagnosis Date  . Alcohol abuse   . Alcoholic hepatitis [id number]  . Anemia   . Anxiety disorder   . Asthma   . Auditory hallucinations   .  Cancer (HCC)    cervical  . Chronic back pain   . Chronic bronchitis with acute exacerbation ([facility])   . Cocaine abuse   . Constipation   . COPD (chronic obstructive pulmonary disease) ([facility])   . Depression   . Dysrhythmia 2012   hx AF in er-cocaine-converted back NSR  . Emphysema   . GERD (gastroesophageal reflux disease)   . Headache   . History of cervical cancer 09/26/2013  . Hypertension   . Malignant neoplasm of tonsillar fossa ([facility])  11/12/2015  . Pneumonia    hx  . Poor circulation   . Schizophrenia ([facility])   . Scoliosis   . Tobacco abuse     Past Surgical History:  Procedure Laterality Date  . ABDOMINAL HYSTERECTOMY    . BREAST BIOPSY  2005   lt bx  . COLONOSCOPY  05/07/2002   Dr.Demason- large approximately 1.5cm polyp pedunculated aproximately 10cm from the anal verge bx= adenomatous polyp  . DIRECT LARYNGOSCOPY N/A 10/25/2015   Procedure: DIRECT LARYNGOSCOPY WITH BIOPSY OF ORAL PHARYNGEAL LESION;  Surgeon: Leta Baptist, MD;  Location: [facility];  Service: ENT;  Laterality: N/A;  DIRECT LARYNGOSCOPY WITH BIOPSY OF ORAL PHARYNGEAL LESION  . ESOPHAGOGASTRODUODENOSCOPY  06/02/2002   Dr. Anthony Sar- mild distal esophagitis w/o stricturing or ulceration. stomach and pylorus are normal. inflammatory changes of proximal duldenum in the first portion.   . ESOPHAGOGASTRODUODENOSCOPY (EGD) WITH PROPOFOL N/A 11/23/2015   Procedure: ESOPHAGOGASTRODUODENOSCOPY (EGD) WITH PROPOFOL;  Surgeon: Danie Binder, MD;  [facility];  Service: Endoscopy;  Laterality: N/A;  1245  . FRACTURE SURGERY  right wrist   [place]  . MULTIPLE EXTRACTIONS WITH ALVEOLOPLASTY N/A 12/13/2015   Procedure: Extraction of tooth #'s 2,3,4,5,6,12,13,14,15,17,and 18 with alveoloplasty, bilateral maxillary tuberosity reductions, and gross debridement of remaining teeth;  Surgeon: Lenn Cal, DDS;  Location: [facility];  Service: Oral Surgery;  Laterality: N/A;  . PEG PLACEMENT  11/18/15  . PORTACATH PLACEMENT Right 11/18/15  . TONSILLECTOMY Left 04/25/2016   Procedure: LEFT SIDE TONSILLECTOMY;  Surgeon: Leta Baptist, MD;  Location: [facility];  Service: ENT;  Laterality: Left;    Family History  Problem Relation Age of Onset  . Alzheimer's disease Mother   . Other Father     "he was beat to death"  . Heart disease Sister   . Gout Sister   . COPD Sister   . Seizures Sister   . Diabetes Sister   . Diabetes Brother   .  Cancer Paternal Grandmother     not sure what kind  . Seizures Sister   . Other Sister     "bones are messed up"  . Hypertension Brother   . Heart attack Brother   . Other Brother     MVA  . Diabetes Brother   . Other Brother     MVA  . Alcohol abuse Daughter   . Other Son     motorcycle accident  . Heart failure Other     Social History   Social History  . Marital status: Widowed    Spouse name: N/A  . Number of children: 3  . Years of education: N/A   Social History Main Topics  . Smoking status: Current Every Day Smoker    Packs/day: 1.00    Years: 48.00    Types: Cigarettes  . Smokeless tobacco: Former Systems developer    Types: Snuff     Comment: One pack a day  .  Alcohol use 0.0 oz/week     Comment: 40 oz a day  . Drug use:     Types: Cocaine, Marijuana     Comment: last used on Friday 8/18  . Sexual activity: Not Currently    Birth control/ protection: Surgical   Other Topics Concern  . None   Social History Narrative  . None     PHYSICAL EXAMINATION  ECOG PERFORMANCE STATUS: 1 - Symptomatic but completely ambulatory  Vitals:   05/23/16 1400  BP: 133/82  Pulse: 97  Resp: 16  Temp: 98.2 F (36.8 C)    GENERAL:alert, no distress, anxious, comfortable, cooperative, smiling and unaccompanied SKIN: skin color, texture, turgor are normal, no rashes or significant lesions HEAD: Normocephalic, No masses, lesions, tenderness or abnormalities EYES: normal, EOMI, Conjunctiva are pink and non-injected EARS: External ears normal OROPHARYNX:lips, buccal mucosa, and tongue normal, edentulous and mucous membranes are moist  NECK: supple, trachea midline LYMPH:  not examined BREAST:not examined LUNGS: clear to auscultation  HEART: regular rate & rhythm ABDOMEN:abdomen soft and normal bowel sounds BACK: Back symmetric, no curvature. EXTREMITIES:less then 2 second capillary refill, no joint deformities, effusion, or inflammation, no skin discoloration, no cyanosis    NEURO: alert & oriented x 3 with fluent speech, no focal motor/sensory deficits, gait normal   LABORATORY DATA: CBC    Component Value Date/Time   WBC 4.9 04/12/2016 1442   RBC 2.68 (L) 04/12/2016 1442   HGB 9.7 (L) 04/12/2016 1442   HCT 27.4 (L) 04/12/2016 1442   PLT 113 (L) 04/12/2016 1442   MCV 102.2 (H) 04/12/2016 1442   MCH 36.2 (H) 04/12/2016 1442   MCHC 35.4 04/12/2016 1442   RDW 17.5 (H) 04/12/2016 1442   LYMPHSABS 2.3 04/12/2016 1442   MONOABS 0.5 04/12/2016 1442   EOSABS 0.0 04/12/2016 1442   BASOSABS 0.0 04/12/2016 1442      Chemistry      Component Value Date/Time   NA 130 (L) 04/12/2016 1442   K 3.7 04/12/2016 1442   CL 94 (L) 04/12/2016 1442   CO2 24 04/12/2016 1442   BUN 10 04/12/2016 1442   CREATININE 1.50 (H) 04/12/2016 1442   GLU 99 09/17/2015      Component Value Date/Time   CALCIUM 7.7 (L) 04/12/2016 1442   ALKPHOS 62 04/12/2016 1442   AST 23 04/12/2016 1442   ALT 11 (L) 04/12/2016 1442   BILITOT 0.5 04/12/2016 1442        PENDING LABS:   RADIOGRAPHIC STUDIES:  No results found.   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  Starting systemic chemotherapy with weekly Cisplatin/Paclitaxel on 12/22/2015.  Complicated by illicit drug abuse.  Treatment on HOLD beginning on 03/22/2016.  Now there are plans for XRT beginning on 1/24 or 06/01/2016.  Oncology history is updated.  No oncology role for labs today.  She is a poor candidate for Cisplatin chemotherapy with XRT.  Until she improves from a performance status standpoint and compliance, we will hold off on any concomitant chemotherapy.  I fear that once she starts experiencing side effects of radiation, her compliance will worsen.  She is losing weight and at this time, not having issues with swallowing.  She is not using her PEG for nutrition as recommended by RD.  Patient  seen by RD, Burtis Junes today.  He will change her formulation to Jevity for her complaint  of diarrhea.  He agrees that this may be futile.  She is given an Imodium sheet today for her diarrhea.  I have refilled her pain medication today.  She complains of chemotherapy-induced peripheral neuropathy.  She reports intolerance to Cymbalta.  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  I will plan on urine drug screening her in the future.  She is advised to refrain from illicit drug abuse.  I am afraid that she will not be able to stop.  This is likely contributing to her weight loss as well.  I hinted at Hospice today and she was not interested in this part of our conversation.  She was advised that if that time came, she would be able to stay at home with help.  Return in 2 weeks for follow-up.   ORDERS PLACED FOR THIS ENCOUNTER: No orders of the defined types were placed in this encounter.   MEDICATIONS PRESCRIBED THIS ENCOUNTER: No orders of the defined types were placed in this encounter.   THERAPY PLAN:  XRT as planned.  All questions were answered. The patient knows to call the clinic with any problems, questions or concerns. We can certainly see the patient much sooner if necessary.  Patient and plan discussed with Dr. Ancil Linsey and she is in agreement with the aforementioned.   This note is electronically signed by: Robynn Pane, PA-C 05/23/2016 6:00 PM

## 2016-05-25 ENCOUNTER — Encounter: Payer: Self-pay | Admitting: Dietician

## 2016-05-25 MED ORDER — JEVITY 1.5 CAL/FIBER PO LIQD: 237.0000 mL | Freq: Four times a day (QID) | ORAL | Status: AC

## 2016-05-25 NOTE — Progress Notes (Signed)
Patient seen a couple days ago. She had reported that her tube feeding was now giving her diarrhea. RD offered to change the formula to which she agreed.   Her order had previously been Osmolite 1.5: 5 cans via PEG, split into 3  meals of  1.5, 1.5 and 2 cans.  This provided 1778 kcals 75 g Pro  905 ml fluid + 900 from flushes = 1805 mls  Patient's weight has decreased significantly since initial regimen was planned. She also is no longer receving chemo and has not for weeks, nor has she begun radiation.    Estimated needs useing most recent anthropometrics. 5'2 96 lbs (43.64 kg)  Kcals: 1350-1550  (31-35 kcal/kg bw) Protein: 60-70g Pro  Fluid: >1.3 Liters fluid.   New Regimen: 1 can 4x a day of Jevity 1.5 via PEG. Flush w/ 90 cc before and after administering can.   Provides: 1422 kcals, 60 g Pro, 720 mls free water (+720 mls), 1440 mls/day.  Will obtain prescription from provider and present to advanced home care.   Burtis Junes RD, LDN, [facility] Pager: [phone] 05/25/2016 2:07 PM

## 2016-06-02 ENCOUNTER — Other Ambulatory Visit (HOSPITAL_COMMUNITY): Payer: Self-pay | Admitting: Oncology

## 2016-06-02 DIAGNOSIS — C090 Malignant neoplasm of tonsillar fossa: Secondary | ICD-10-CM

## 2016-06-02 MED ORDER — HYDROCODONE-ACETAMINOPHEN 5-325 MG PO TABS: 1.0000 | ORAL_TABLET | ORAL | 0 refills | Status: DC | PRN

## 2016-06-07 ENCOUNTER — Encounter: Payer: Self-pay | Admitting: Dietician

## 2016-06-07 ENCOUNTER — Encounter (HOSPITAL_COMMUNITY): Payer: Self-pay | Admitting: Oncology

## 2016-06-07 ENCOUNTER — Encounter (HOSPITAL_BASED_OUTPATIENT_CLINIC_OR_DEPARTMENT_OTHER): Payer: Medicaid Other | Admitting: Oncology

## 2016-06-07 ENCOUNTER — Encounter (HOSPITAL_COMMUNITY): Payer: Self-pay | Admitting: Lab

## 2016-06-07 DIAGNOSIS — C090 Malignant neoplasm of tonsillar fossa: Secondary | ICD-10-CM

## 2016-06-07 NOTE — Progress Notes (Unsigned)
Referral sent to Hospice. Records faxed on 1/31

## 2016-06-07 NOTE — Progress Notes (Signed)
56y/o female PMHx :Tobacco abuse (48 pack year), severe etoh abuse (6 pack or 40 oz daily), illicit drug abuse,HTN, alcoholic hepatitis, COPD,Cervical cancer,GERD, anxiety,schizophrenia,mental retardation(2014)Esophageal Dysphagia (2015)  Contacted Pt by visiting during office visit  Wt Readings from Last 10 Encounters:  05/23/16 96 lb (43.5 kg)  04/25/16 98 lb 3.2 oz (44.5 kg)  04/12/16 100 lb 9.6 oz (45.6 kg)  03/28/16 100 lb 3.2 oz (45.5 kg)  03/22/16 100 lb 3.2 oz (45.5 kg)  03/14/16 102 lb 12.8 oz (46.6 kg)  03/03/16 104 lb 3.2 oz (47.3 kg)  02/24/16 105 lb 9.6 oz (47.9 kg)  02/18/16 103 lb 6.4 oz (46.9 kg)  02/09/16 106 lb (48.1 kg)   Patient weight has has decreased by another two lbs in the last month.   Unfortunately, she says she has not received the different tube feeding formula.   Today she relates problems with her tube that should have been relayed to care team quickly, but were not. She says 2 months ago her PEG "fell out" and she placed it back in and never told anyone. She also says her tube "has been leaking for some time". She says she was supposed to have a scheduled appointment with the radiologist to fix this. No documentation of this is seen. Furthermore, she says it is "pussing" and tender. There has been no mention of this to the care team.   She says her diarrhea and nausea have improved. She says she is eating extremely well. She is drinking 3 boost supplements a day and is eating 3 meals a day. She has been consuming a large number of honey buns. She is seen drinking coffee with 2 creamers and 10 packs of sugar.   Throughout patient course, RD has reiterated to her several times, that her PEG is her means to maintaining her weight and ultimately being able to undergo treatment. Despite this, each appointment, she does not show any effort on even trying to make her tube feeding work.   PA today says that RAD onc has deemed her not a candidate for  treatment. She is also no longer a candidate for Med Onc. PA to hold hospice discussion today. RD also feels this is the best option given her repeatedly shown inability to use her PEG; treating her with radiation would quickly reduce her fluid/kcal intake to 0.   RD will no longer be no longer follow as she will not be receiving further treatment.   Burtis Junes RD, LDN, [facility] Pager: [phone] 06/07/2016 11:59 AM

## 2016-06-07 NOTE — Progress Notes (Signed)
Molly Fire, MD [address] [place] [address]  Malignant neoplasm of tonsillar fossa [facility])  CURRENT THERAPY: Treatment on HOLD  INTERVAL HISTORY: [name] 56 y.o. 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  Starting systemic chemotherapy with weekly Cisplatin/Paclitaxel on 12/22/2015- 03/28/2016.    Malignant neoplasm of tonsillar fossa ([place])   10/05/2015 Imaging    CT imaging [place], bilateral palatine tonsil enlargement with asymmetric abnormality of R tonsil and soft palate, possible R cervical adenopathy      10/25/2015 Procedure    Direct laryngoscopy and biopsyof right tonsillar mass.      10/27/2015 Pathology Results    invasive squamous cell carcinoma, p16 NEGATIVE      11/10/2015 PET scan    Bilateral palatine tonsil enlargement and hypermetabolism, with asymmetric hypermetabolism in the right palatine tonsil, consistent with primary malignancy in this location. Bilateral mildly enlarged hypermetabolic level 2 neck lymph nodes.      11/18/2015 Procedure    IR Gastrostomy Tube, fluoroscopic 20 French Pull through Gastrostomy      11/27/2015 Procedure    CT-guided core biopsy performed of the bilobed shaped soft tissue mass anterior to the proximal stomach.      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      12/07/2015 Imaging    NM Myocar multi w spect- Nuclear stress EF: 70%.      12/22/2015 - 03/22/2016 Chemotherapy    Cisplatin/Paclitaxel weekly      02/25/2016 Imaging    CT CAP- 1. Small nonspecific pulmonary nodules are stable when compared with previous exam. 2. The solid lobulated mass arising from the anterior proximal stomach is un changed in size when compared with previous exam. 3. No new or progressive findings identified within the chest, abdomen or pelvis. 4.  Aortic atherosclerosis and coronary artery calcification.      02/25/2016 Imaging    CT neck- History of right tonsil cancer status post surgery. No suspicious enhancement in the surgical region.      03/28/2016 Adverse Reaction    Peripheral neuropathy      03/28/2016 Treatment Plan Change    Chemotherapy on HOLD      She is seen by RD today, Burtis Junes.  She continues to lose weight.  I have spoken to Dr. Lianne Cure (XRT) today.  He does not think she is a candidate for XRT.  He notes that when he saw her last week, she was rude and inappropriate.  In his opinion, she was much more docile on his first encounter with her.  I suspect her illicit drug abuse is to blame for this.    Given that she is not a candidate for XRT, nor systemic chemotherapy, we discussed comfort care only.  She admits that she did crack this past weekend.  Review of Systems  Constitutional: Positive for weight loss. Negative for chills and fever.  HENT: Negative.   Respiratory: Negative.  Negative for cough.   Cardiovascular: Negative.  Negative for chest pain.  Genitourinary: Negative.   Musculoskeletal: Negative.   Skin: Negative.   Neurological: Negative.  Negative for weakness.  Endo/Heme/Allergies: Negative.   Psychiatric/Behavioral: Positive for substance abuse (crack).    Past Medical History:  Diagnosis Date  . Alcohol abuse   . Alcoholic hepatitis [id number]  . Anemia   . Anxiety disorder   .  Asthma   . Auditory hallucinations   . Cancer (HCC)    cervical  . Chronic back pain   . Chronic bronchitis with acute exacerbation ([place])   . Cocaine abuse   . Constipation   . COPD (chronic obstructive pulmonary disease) ([place])   . Depression   . Dysrhythmia 2012   hx AF in er-cocaine-converted back NSR  . Emphysema   . GERD (gastroesophageal reflux disease)   . Headache   . History of cervical cancer 09/26/2013  . Hypertension   . Malignant neoplasm of tonsillar fossa ([place]) 11/12/2015  .  Pneumonia    hx  . Poor circulation   . Schizophrenia ([place])   . Scoliosis   . Tobacco abuse     Past Surgical History:  Procedure Laterality Date  . ABDOMINAL HYSTERECTOMY    . BREAST BIOPSY  2005   lt bx  . COLONOSCOPY  05/07/2002   Dr.Demason- large approximately 1.5cm polyp pedunculated aproximately 10cm from the anal verge bx= adenomatous polyp  . DIRECT LARYNGOSCOPY N/A 10/25/2015   Procedure: DIRECT LARYNGOSCOPY WITH BIOPSY OF ORAL PHARYNGEAL LESION;  Surgeon: Leta Baptist, MD;  Location: [facility];  Service: ENT;  Laterality: N/A;  DIRECT LARYNGOSCOPY WITH BIOPSY OF ORAL PHARYNGEAL LESION  . ESOPHAGOGASTRODUODENOSCOPY  06/02/2002   Dr. Anthony Sar- mild distal esophagitis w/o stricturing or ulceration. stomach and pylorus are normal. inflammatory changes of proximal duldenum in the first portion.   . ESOPHAGOGASTRODUODENOSCOPY (EGD) WITH PROPOFOL N/A 11/23/2015   Procedure: ESOPHAGOGASTRODUODENOSCOPY (EGD) WITH PROPOFOL;  Surgeon: Danie Binder, MD;  [facility];  Service: Endoscopy;  Laterality: N/A;  1245  . FRACTURE SURGERY  right wrist   [place]  . MULTIPLE EXTRACTIONS WITH ALVEOLOPLASTY N/A 12/13/2015   Procedure: Extraction of tooth #'s 2,3,4,5,6,12,13,14,15,17,and 18 with alveoloplasty, bilateral maxillary tuberosity reductions, and gross debridement of remaining teeth;  Surgeon: Lenn Cal, DDS;  Location: [facility];  Service: Oral Surgery;  Laterality: N/A;  . PEG PLACEMENT  11/18/15  . PORTACATH PLACEMENT Right 11/18/15  . TONSILLECTOMY Left 04/25/2016   Procedure: LEFT SIDE TONSILLECTOMY;  Surgeon: Leta Baptist, MD;  Location: [facility];  Service: ENT;  Laterality: Left;    Family History  Problem Relation Age of Onset  . Alzheimer's disease Mother   . Other Father     "he was beat to death"  . Heart disease Sister   . Gout Sister   . COPD Sister   . Seizures Sister   . Diabetes Sister   . Diabetes Brother   . Cancer Paternal  Grandmother     not sure what kind  . Seizures Sister   . Other Sister     "bones are messed up"  . Hypertension Brother   . Heart attack Brother   . Other Brother     MVA  . Diabetes Brother   . Other Brother     MVA  . Alcohol abuse Daughter   . Other Son     motorcycle accident  . Heart failure Other     Social History   Social History  . Marital status: Widowed    Spouse name: N/A  . Number of children: 3  . Years of education: N/A   Social History Main Topics  . Smoking status: Current Every Day Smoker    Packs/day: 1.00    Years: 48.00    Types: Cigarettes  . Smokeless tobacco: Former Systems developer    Types: Snuff  Comment: One pack a day  . Alcohol use 0.0 oz/week     Comment: 40 oz a day  . Drug use: Yes    Types: Cocaine, Marijuana     Comment: last used on Friday 8/18  . Sexual activity: Not Currently    Birth control/ protection: Surgical   Other Topics Concern  . None   Social History Narrative  . None     PHYSICAL EXAMINATION  ECOG PERFORMANCE STATUS: 1 - Symptomatic but completely ambulatory  Vitals:   06/07/16 1202  BP: (!) 146/95  Pulse: (!) 102  Resp: 16  Temp: 97.9 F (36.6 C)    GENERAL:alert, no distress, anxious, comfortable, cooperative, smiling and unaccompanied SKIN: skin color, texture, turgor are normal, no rashes or significant lesions HEAD: Normocephalic, No masses, lesions, tenderness or abnormalities EYES: normal, EOMI, Conjunctiva are pink and non-injected EARS: External ears normal OROPHARYNX:lips, buccal mucosa, and tongue normal, edentulous and mucous membranes are moist  NECK: supple, trachea midline LYMPH:  not examined BREAST:not examined LUNGS: clear to auscultation  HEART: regular rate & rhythm ABDOMEN:abdomen soft and normal bowel sounds BACK: Back symmetric, no curvature. EXTREMITIES:less then 2 second capillary refill, no joint deformities, effusion, or inflammation, no skin discoloration, no cyanosis    NEURO: alert & oriented x 3 with fluent speech, no focal motor/sensory deficits, gait normal   LABORATORY DATA: CBC    Component Value Date/Time   WBC 4.9 04/12/2016 1442   RBC 2.68 (L) 04/12/2016 1442   HGB 9.7 (L) 04/12/2016 1442   HCT 27.4 (L) 04/12/2016 1442   PLT 113 (L) 04/12/2016 1442   MCV 102.2 (H) 04/12/2016 1442   MCH 36.2 (H) 04/12/2016 1442   MCHC 35.4 04/12/2016 1442   RDW 17.5 (H) 04/12/2016 1442   LYMPHSABS 2.3 04/12/2016 1442   MONOABS 0.5 04/12/2016 1442   EOSABS 0.0 04/12/2016 1442   BASOSABS 0.0 04/12/2016 1442      Chemistry      Component Value Date/Time   NA 130 (L) 04/12/2016 1442   K 3.7 04/12/2016 1442   CL 94 (L) 04/12/2016 1442   CO2 24 04/12/2016 1442   BUN 10 04/12/2016 1442   CREATININE 1.50 (H) 04/12/2016 1442   GLU 99 09/17/2015      Component Value Date/Time   CALCIUM 7.7 (L) 04/12/2016 1442   ALKPHOS 62 04/12/2016 1442   AST 23 04/12/2016 1442   ALT 11 (L) 04/12/2016 1442   BILITOT 0.5 04/12/2016 1442        PENDING LABS:   RADIOGRAPHIC STUDIES:  No results found.   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  Starting systemic chemotherapy with weekly Cisplatin/Paclitaxel on 12/22/2015.  Complicated by illicit drug abuse.  Chemotherapy treatment on HOLD beginning on 03/22/2016.   She is not a candidate for XRT nor ongoing systemic chemotherapy.  Oncology history is updated.  No oncology role for labs today.  She is not a candidate for XRT.  I have discussed the patient's case with Dr. Lianne Cure today.  He agrees.  She certainly is not a candidate for ongoing systemic chemotherapy.  We discussed next best step which is comfort care with Hospice.  We had a long conversation regarding Hospice.  Patient education was given regarding Hospice and the services they provide.  The patient understands that studies report  that early enrollment in Hospice actually allows the patient  to live longer compared to those who enroll in Hospice nearer to end of life.   Hospice will allow the patient to stay at home at end of life or go to a facility for end of life care.  At this point, the patient would like to remain at home.  Hospice provides the patient with a team of providers to help with care including physicians, nurses, aids, chaplains, and social workers.   The patient is certainly Hospice appropriate with a life expectancy of less than 6 months.  The patient understands that she can be discharged from Hospice at anytime.    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    Referral to [facility].    Return PRN.   ORDERS PLACED FOR THIS ENCOUNTER: No orders of the defined types were placed in this encounter.   MEDICATIONS PRESCRIBED THIS ENCOUNTER: No orders of the defined types were placed in this encounter.   THERAPY PLAN:  Refer to [facility].  All questions were answered. The patient knows to call the clinic with any problems, questions or concerns. We can certainly see the patient much sooner if necessary.  Patient and plan discussed with Dr. Ancil Linsey and she is in agreement with the aforementioned.   This note is electronically signed by: Doy Mince 06/07/2016 5:09 PM

## 2016-06-07 NOTE — Patient Instructions (Signed)
[place] at [facility] Discharge Instructions  RECOMMENDATIONS MADE BY THE CONSULTANT AND ANY TEST RESULTS WILL BE SENT TO YOUR REFERRING PHYSICIAN.  I discussed your case with Dr. Lianne Cure, Radiation Oncologist.  He reports that due to ongoing weight loss, you are not a candidate at this time for radiation therapy. Due to side effects from previous chemotherapy, you are not a candidate for further chemotherapy. Therefore, we will focus on comfort care with the assistance of Hospice at home.  We will make the referral accordingly.  You can expect a call from them in the near future to set-up a home consultation.  Thank you for choosing [place] at [facility] to provide your oncology and hematology care.  To afford each patient quality time with our provider, please arrive at least 15 minutes before your scheduled appointment time.    If you have a lab appointment with the [facility] please come in thru the  Main Entrance and check in at the main information desk  You need to re-schedule your appointment should you arrive 10 or more minutes late.  We strive to give you quality time with our providers, and arriving late affects you and other patients whose appointments are after yours.  Also, if you no show three or more times for appointments you may be dismissed from the clinic at the providers discretion.     Again, thank you for choosing [facility].  Our hope is that these requests will decrease the amount of time that you wait before being seen by our physicians.       _____________________________________________________________  Should you have questions after your visit to [facility], please contact our office at [phone] between the hours of 8:30 a.m. and 4:30 p.m.  Voicemails left after 4:30 p.m. will not be returned until the following business day.  For prescription refill requests, have your pharmacy  contact our office.       Resources For Cancer Patients and their Caregivers ? American Cancer Society: Can assist with transportation, wigs, general needs, runs Look Good Feel Better.        786-758-1897 ? Cancer Care: Provides financial assistance, online support groups, medication/co-pay assistance.  1-800-813-HOPE 517-028-0203) ? [place] Assists [place] cancer patients and their families through emotional , educational and financial support.  340-028-6294 ? [place] DSS Where to apply for food stamps, Medicaid and utility assistance. (506)582-3090 ? [facility]: Transportation to medical appointments. 614-522-1893 ? Social Security Administration: May apply for disability if have a Stage IV cancer. 367-453-7623 725-470-5581 ? [employer], Disability and Transit Services: Assists with nutrition, care and transit needs. [place] Support Programs: @10RELATIVEDAYS @ > Cancer Support Group  2nd Tuesday of the month 1pm-2pm, Journey Room  > Creative Journey  3rd Tuesday of the month 1130am-1pm, Journey Room  > Look Good Feel Better  1st Wednesday of the month 10am-12 noon, Journey Room (Call [place] to register [phone])

## 2016-06-07 NOTE — Assessment & Plan Note (Signed)
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  Starting systemic chemotherapy with weekly Cisplatin/Paclitaxel on 12/22/2015.  Complicated by illicit drug abuse.  Chemotherapy treatment on HOLD beginning on 03/22/2016.   She is not a candidate for XRT nor ongoing systemic chemotherapy.  Oncology history is updated.  No oncology role for labs today.  She is not a candidate for XRT.  I have discussed the patient's case with Dr. Lianne Cure today.  He agrees.  She certainly is not a candidate for ongoing systemic chemotherapy.  We discussed next best step which is comfort care with Hospice.  We had a long conversation regarding Hospice.  Patient education was given regarding Hospice and the services they provide.  The patient understands that studies report that early enrollment in Hospice actually allows the patient to live longer compared to those who enroll in Hospice nearer to end of life.   Hospice will allow the patient to stay at home at end of life or go to a facility for end of life care.  At this point, the patient would like to remain at home.  Hospice provides the patient with a team of providers to help with care including physicians, nurses, aids, chaplains, and social workers.   The patient is certainly Hospice appropriate with a life expectancy of less than 6 months.  The patient understands that she can be discharged from Hospice at anytime.    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    Referral to [facility]    Return PRN.

## 2016-06-16 ENCOUNTER — Telehealth (HOSPITAL_COMMUNITY): Payer: Self-pay | Admitting: *Deleted

## 2016-06-16 ENCOUNTER — Telehealth (HOSPITAL_COMMUNITY): Payer: Self-pay

## 2016-06-16 DIAGNOSIS — C090 Malignant neoplasm of tonsillar fossa: Secondary | ICD-10-CM

## 2016-06-16 DIAGNOSIS — C099 Malignant neoplasm of tonsil, unspecified: Secondary | ICD-10-CM

## 2016-06-16 MED ORDER — AZITHROMYCIN 250 MG PO TABS: ORAL_TABLET | ORAL | 0 refills | Status: DC

## 2016-06-16 MED ORDER — OXYCODONE HCL 5 MG/5ML PO SOLN: 5.0000 mg | ORAL | 0 refills | Status: DC | PRN

## 2016-06-16 NOTE — Telephone Encounter (Signed)
Pt referred to Hospice records faxed pt will be contacted by Molly Campbell

## 2016-06-16 NOTE — Telephone Encounter (Signed)
Received call from patient that she had not been seen by hospice yet. She is requesting pain med refill and antibiotic for green phlegm. Called hospice and they stated they have not been able to get in touch with patient. Gave them different numbers to try. Reviewed with PA-C, who ordered pain med refill and antibiotic. Called patient cell phone and sisters number and left messages at both letting her know prescriptions were ready.

## 2016-06-20 ENCOUNTER — Telehealth (HOSPITAL_COMMUNITY): Payer: Self-pay | Admitting: Emergency Medicine

## 2016-06-20 ENCOUNTER — Other Ambulatory Visit (HOSPITAL_COMMUNITY): Payer: Self-pay | Admitting: Emergency Medicine

## 2016-06-20 DIAGNOSIS — C090 Malignant neoplasm of tonsillar fossa: Secondary | ICD-10-CM

## 2016-06-20 MED ORDER — HYDROCODONE-ACETAMINOPHEN 5-325 MG PO TABS: 1.0000 | ORAL_TABLET | ORAL | 0 refills | Status: DC | PRN

## 2016-06-20 NOTE — Telephone Encounter (Signed)
Hospice nurse beth called and wanted to get a refill on hydrocodone and [place] apothecary cough syrup.  Spoke with Kirby Crigler PA and refills faxed to [facility].  Beth notified.

## 2016-07-06 ENCOUNTER — Encounter: Payer: Self-pay | Admitting: Cardiology

## 2016-07-06 ENCOUNTER — Ambulatory Visit (INDEPENDENT_AMBULATORY_CARE_PROVIDER_SITE_OTHER): Payer: Medicaid Other | Admitting: Cardiology

## 2016-07-06 VITALS — BP 126/77 | HR 83 | Ht 62.0 in | Wt 92.0 lb

## 2016-07-06 DIAGNOSIS — I519 Heart disease, unspecified: Secondary | ICD-10-CM | POA: Diagnosis not present

## 2016-07-06 DIAGNOSIS — I4891 Unspecified atrial fibrillation: Secondary | ICD-10-CM | POA: Diagnosis not present

## 2016-07-06 DIAGNOSIS — I5189 Other ill-defined heart diseases: Secondary | ICD-10-CM

## 2016-07-06 DIAGNOSIS — R0789 Other chest pain: Secondary | ICD-10-CM

## 2016-07-06 MED ORDER — NITROGLYCERIN 0.4 MG SL SUBL: 0.4000 mg | SUBLINGUAL_TABLET | SUBLINGUAL | 3 refills | Status: AC | PRN

## 2016-07-06 NOTE — Patient Instructions (Signed)
Your physician recommends that you schedule a follow-up appointment in: AS NEEDED WITH DR. BRANCH  Your physician has recommended you make the following change in your medication:   NITROGLYCERIN 0.4 MG UNDER THE TONGUE AS NEEDED FOR CHEST PAIN  Nitroglycerin sublingual tablets What is this medicine? NITROGLYCERIN (nye troe GLI ser in) is a type of vasodilator. It relaxes blood vessels, increasing the blood and oxygen supply to your heart. This medicine is used to relieve chest pain caused by angina. It is also used to prevent chest pain before activities like climbing stairs, going outdoors in cold weather, or sexual activity. This medicine may be used for other purposes; ask your health care provider or pharmacist if you have questions. COMMON BRAND NAME(S): Nitroquick, Nitrostat, Nitrotab What should I tell my health care provider before I take this medicine? They need to know if you have any of these conditions: -anemia -head injury, recent stroke, or bleeding in the brain -liver disease -previous heart attack -an unusual or allergic reaction to nitroglycerin, other medicines, foods, dyes, or preservatives -pregnant or trying to get pregnant -breast-feeding How should I use this medicine? Take this medicine by mouth as needed. At the first sign of an angina attack (chest pain or tightness) place one tablet under your tongue. You can also take this medicine 5 to 10 minutes before an event likely to produce chest pain. Follow the directions on the prescription label. Let the tablet dissolve under the tongue. Do not swallow whole. Replace the dose if you accidentally swallow it. It will help if your mouth is not dry. Saliva around the tablet will help it to dissolve more quickly. Do not eat or drink, smoke or chew tobacco while a tablet is dissolving. If you are not better within 5 minutes after taking ONE dose of nitroglycerin, call 9-1-1 immediately to seek emergency medical care. Do not take  more than 3 nitroglycerin tablets over 15 minutes. If you take this medicine often to relieve symptoms of angina, your doctor or health care professional may provide you with different instructions to manage your symptoms. If symptoms do not go away after following these instructions, it is important to call 9-1-1 immediately. Do not take more than 3 nitroglycerin tablets over 15 minutes. Talk to your pediatrician regarding the use of this medicine in children. Special care may be needed. Overdosage: If you think you have taken too much of this medicine contact a poison control center or emergency room at once. NOTE: This medicine is only for you. Do not share this medicine with others. What if I miss a dose? This does not apply. This medicine is only used as needed. What may interact with this medicine? Do not take this medicine with any of the following medications: -certain migraine medicines like ergotamine and dihydroergotamine (DHE) -medicines used to treat erectile dysfunction like sildenafil, tadalafil, and vardenafil -riociguat This medicine may also interact with the following medications: -alteplase -aspirin -heparin -medicines for high blood pressure -medicines for mental depression -other medicines used to treat angina -phenothiazines like chlorpromazine, mesoridazine, prochlorperazine, thioridazine This list may not describe all possible interactions. Give your health care provider a list of all the medicines, herbs, non-prescription drugs, or dietary supplements you use. Also tell them if you smoke, drink alcohol, or use illegal drugs. Some items may interact with your medicine. What should I watch for while using this medicine? Tell your doctor or health care professional if you feel your medicine is no longer working. Keep this  medicine with you at all times. Sit or lie down when you take your medicine to prevent falling if you feel dizzy or faint after using it. Try to remain  calm. This will help you to feel better faster. If you feel dizzy, take several deep breaths and lie down with your feet propped up, or bend forward with your head resting between your knees. You may get drowsy or dizzy. Do not drive, use machinery, or do anything that needs mental alertness until you know how this drug affects you. Do not stand or sit up quickly, especially if you are an older patient. This reduces the risk of dizzy or fainting spells. Alcohol can make you more drowsy and dizzy. Avoid alcoholic drinks. Do not treat yourself for coughs, colds, or pain while you are taking this medicine without asking your doctor or health care professional for advice. Some ingredients may increase your blood pressure. What side effects may I notice from receiving this medicine? Side effects that you should report to your doctor or health care professional as soon as possible: -blurred vision -dry mouth -skin rash -sweating -the feeling of extreme pressure in the head -unusually weak or tired Side effects that usually do not require medical attention (report to your doctor or health care professional if they continue or are bothersome): -flushing of the face or neck -headache -irregular heartbeat, palpitations -nausea, vomiting This list may not describe all possible side effects. Call your doctor for medical advice about side effects. You may report side effects to FDA at 1-800-FDA-1088. Where should I keep my medicine? Keep out of the reach of children. Store at room temperature between 20 and 25 degrees C (68 and 77 degrees F). Store in [facility] [employer]. Protect from light and moisture. Keep tightly closed. Throw away any unused medicine after the expiration date. NOTE: This sheet is a summary. It may not cover all possible information. If you have questions about this medicine, talk to your doctor, pharmacist, or health care provider.  2018 Elsevier/Gold Standard (2013-02-20  17:57:36)

## 2016-07-06 NOTE — Progress Notes (Signed)
Clinical Summary Molly Campbell is a 56 y.o.female seen today for follow up of the following medical problems.   1. Abnormal EKG/Preoperative evaluation/Chest pain - being considered for dental surgery with mutilple extractions - in preop evaluation noted to have abnormal EKG with new diffuse precordial TWIs that were new - echo 2014 with LVEF 65-70%, no WMAs, moderate LVH. ?myxoma - 12/2015 nuclear stress test small mild partially reversible anteroseptal defect, likely attenuation less likely mild ischemia   - tightness in chest left side or right side. Left arm pain. Can feel hot. 6/10 in severity. Worst with movement. Pain lasts about 5 minutes.   2. Afib - isolated episode, apparently related to [facility] abuse at the time.  - no recent palpitatoins. No detected recurrences.    3. 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 She has just recently started home hospice regarding her cancer.   5. 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   6. Leg pains - bilateral leg pains. Mainly with walking.  - ABIs 02/2016 normal  Past Medical History:  Diagnosis Date  . Alcohol abuse   . Alcoholic hepatitis [id number]  . Anemia   . Anxiety disorder   . Asthma   . Auditory hallucinations   . Cancer (HCC)    cervical  . Chronic back pain   . Chronic bronchitis with acute exacerbation ([place])   . Cocaine abuse   . Constipation   . COPD (chronic obstructive pulmonary disease) ([place])   . Depression   . Dysrhythmia 2012   hx AF in er-cocaine-converted back NSR  . Emphysema   . GERD (gastroesophageal reflux disease)   . Headache   . History of cervical cancer 09/26/2013  . Hypertension   . Malignant neoplasm of tonsillar fossa ([place])  11/12/2015  . Pneumonia    hx  . Poor circulation   . Schizophrenia ([place])   . Scoliosis   . Tobacco abuse      Allergies  Allergen Reactions  . Benicar [Olmesartan] Palpitations and Other (See Comments)    TACHYCARDIA She was told to never take it again  . Ibuprofen Palpitations    TACHYCARDIA  . Latex Dermatitis    Latex bandages - little sores      Current Outpatient Prescriptions  Medication Sig Dispense Refill  . albuterol (PROVENTIL HFA;VENTOLIN HFA) 108 (90 BASE) MCG/ACT inhaler Inhale 1-2 puffs into the lungs every 6 (six) hours as needed for wheezing or shortness of breath. 1 Inhaler 0  . amLODipine (NORVASC) 5 MG tablet Take 5 mg by mouth daily.    Marland Kitchen azithromycin (ZITHROMAX) 250 MG tablet Take 2 tabs on first day , then 1 tab every day thereafter until finished. 6 each 0  . CISPLATIN IV Inject into the vein.    Marland Kitchen dexamethasone (DECADRON) 4 MG tablet Take 2 tablets by mouth once a day on the day after chemotherapy and then take 2 tablets two times a day for 2 days. Take with food. 30 tablet 1  . DULoxetine (CYMBALTA) 30 MG capsule Take 30 mg PO daily x 5 days, then take 2 per day PO. 60 capsule 1  . HYDROcodone-acetaminophen (NORCO) 5-325 MG tablet Take 1-2 tablets by mouth every 4 (four)  hours as needed for moderate pain or severe pain. 30 tablet 0  . lidocaine (XYLOCAINE) 2 % solution Mix 1 part 2%viscous lidocaine,1part H2O.Swish and swallow 36mL of this mixture,43min before meals and at bedtime, up to QID 100 mL 5  . lidocaine-prilocaine (EMLA) cream Apply to affected area once 30 g 3  . LORazepam (ATIVAN) 0.5 MG tablet Take 0.5 mg by mouth once. Take one tablet before radiation treatments    . Multiple Vitamins-Minerals (MULTIVITAMIN) tablet Take 1 tablet by mouth daily. 180 tablet 1  . nicotine (NICODERM CQ - DOSED IN MG/24 HOURS) 14 mg/24hr patch Place 1 patch (14 mg total) onto the skin daily. Apply 21 mg patch daily x 6 wk, then 14mg  patch daily x 2 wk, then 7 mg  patch daily x 2 wk 14 patch 0  . Nutritional Supplements (FEEDING SUPPLEMENT, OSMOLITE 1.5 CAL,) LIQD 5 cans via PEG, split into 3 meals of 1.5, 1.5 and 2 cans. Flush with 150 cc water before and after each feed (Patient taking differently: by PEG Tube route See admin instructions. 5 cans via PEG, split into 3 meals of 1.5, 1.5 and 2 cans. Flush with 150 cc water before and after each feed)  0  . omeprazole (PRILOSEC) 20 MG capsule 1 PO EVERY MORNING 31 capsule 11  . ondansetron (ZOFRAN) 8 MG tablet Take 1 tablet (8 mg total) by mouth 2 (two) times daily as needed. Start on the third day after chemotherapy. 30 tablet 1  . oxyCODONE (ROXICODONE) 5 MG/5ML solution Take 5 mLs (5 mg total) by mouth every 4 (four) hours as needed for severe pain. 300 mL 0  . PACLitaxel (TAXOL IV) Inject into the vein. weekly    . polyethylene glycol powder (GLYCOLAX/MIRALAX) powder Take one capsule and mix in 4-6 ounces of water, take one to two times daily as needed for constipation. 527 g 3  . potassium chloride SA (K-DUR,KLOR-CON) 20 MEQ tablet Take 1 tablet (20 mEq total) by mouth 2 (two) times daily. 60 tablet 1  . prochlorperazine (COMPAZINE) 10 MG tablet Take 1 tablet (10 mg total) by mouth every 6 (six) hours as needed (Nausea or vomiting). 30 tablet 1  . sodium fluoride (FLUORISHIELD) 1.1 % GEL dental gel Instill one drop of gel per tooth space of fluoride tray. Place over teeth for 5 minutes. Remove. Spit out excess. Repeat nightly. 120 mL 11  . thiamine (VITAMIN B-1) 100 MG tablet Take 1 tablet (100 mg total) by mouth daily. 180 tablet 1   Current Facility-Administered Medications  Medication Dose Route Frequency Provider Last Rate Last Dose  . feeding supplement (JEVITY 1.5 CAL/FIBER) liquid 237 mL  237 mL Per Tube QID Patrici Ranks, MD       Facility-Administered Medications Ordered in Other Visits  Medication Dose Route Frequency Provider Last Rate Last Dose  . 0.9 %  sodium chloride infusion    Intravenous Continuous Manon Hilding Kefalas, PA-C      . 0.9 %  sodium chloride infusion   Intravenous Continuous Manon Hilding Kefalas, PA-C      . 0.9 %  sodium chloride infusion   Intravenous Continuous Manon Hilding Kefalas, PA-C      . 0.9 %  sodium chloride infusion   Intravenous Continuous Manon Hilding Kefalas, PA-C      . 0.9 %  sodium chloride infusion   Intravenous Continuous Manon Hilding Kefalas, PA-C      . 0.9 %  sodium chloride infusion  Intravenous Continuous Manon Hilding Kefalas, PA-C      . 0.9 %  sodium chloride infusion   Intravenous Continuous Manon Hilding Kefalas, PA-C      . dextrose 5 % and 0.45% NaCl 1,000 mL with potassium chloride 20 mEq, magnesium sulfate 12 mEq infusion   Intravenous Once Patrici Ranks, MD      . dextrose 5 % and 0.45% NaCl 1,000 mL with potassium chloride 20 mEq, magnesium sulfate 12 mEq infusion   Intravenous Once Patrici Ranks, MD         Past Surgical History:  Procedure Laterality Date  . ABDOMINAL HYSTERECTOMY    . BREAST BIOPSY  2005   lt bx  . COLONOSCOPY  05/07/2002   Dr.Demason- large approximately 1.5cm polyp pedunculated aproximately 10cm from the anal verge bx= adenomatous polyp  . DIRECT LARYNGOSCOPY N/A 10/25/2015   Procedure: DIRECT LARYNGOSCOPY WITH BIOPSY OF ORAL PHARYNGEAL LESION;  Surgeon: Leta Baptist, MD;  Location: [place];  Service: ENT;  Laterality: N/A;  DIRECT LARYNGOSCOPY WITH BIOPSY OF ORAL PHARYNGEAL LESION  . ESOPHAGOGASTRODUODENOSCOPY  06/02/2002   Dr. Anthony Sar- mild distal esophagitis w/o stricturing or ulceration. stomach and pylorus are normal. inflammatory changes of proximal duldenum in the first portion.   . ESOPHAGOGASTRODUODENOSCOPY (EGD) WITH PROPOFOL N/A 11/23/2015   Procedure: ESOPHAGOGASTRODUODENOSCOPY (EGD) WITH PROPOFOL;  Surgeon: Danie Binder, MD;  [facility];  Service: Endoscopy;  Laterality: N/A;  1245  . FRACTURE SURGERY  right wrist     . MULTIPLE EXTRACTIONS WITH ALVEOLOPLASTY N/A  12/13/2015   Procedure: Extraction of tooth #'s 2,3,4,5,6,12,13,14,15,17,and 18 with alveoloplasty, bilateral maxillary tuberosity reductions, and gross debridement of remaining teeth;  Surgeon: Lenn Cal, DDS;  Location: [place];  Service: Oral Surgery;  Laterality: N/A;  . PEG PLACEMENT  11/18/15  . PORTACATH PLACEMENT Right 11/18/15  . TONSILLECTOMY Left 04/25/2016   Procedure: LEFT SIDE TONSILLECTOMY;  Surgeon: Leta Baptist, MD;  Location: [place];  Service: ENT;  Laterality: Left;     Allergies  Allergen Reactions  . Benicar [Olmesartan] Palpitations and Other (See Comments)    TACHYCARDIA She was told to never take it again  . Ibuprofen Palpitations    TACHYCARDIA  . Latex Dermatitis    Latex bandages - little sores       Family History  Problem Relation Age of Onset  . Alzheimer's disease Mother   . Other Father     "he was beat to death"  . Heart disease Sister   . Gout Sister   . COPD Sister   . Seizures Sister   . Diabetes Sister   . Diabetes Brother   . Cancer Paternal Grandmother     not sure what kind  . Seizures Sister   . Other Sister     "bones are messed up"  . Hypertension Brother   . Heart attack Brother   . Other Brother     MVA  . Diabetes Brother   . Other Brother     MVA  . Alcohol abuse Daughter   . Other Son     motorcycle accident  . Heart failure Other      Social History Ms. Klomp reports that she has been smoking Cigarettes.  She has a 48.00 pack-year smoking history. She has quit using smokeless tobacco. Her smokeless tobacco use included Snuff. Ms. Cahalane reports that she drinks alcohol.   Review of Systems CONSTITUTIONAL: No weight loss, fever,  chills, weakness or fatigue.  HEENT: Eyes: No visual loss, blurred vision, double vision or yellow sclerae.No hearing loss, sneezing, congestion, runny nose or sore throat.  SKIN: No rash or itching.  CARDIOVASCULAR: per hpi RESPIRATORY: No shortness of breath,  cough or sputum.  GASTROINTESTINAL: No anorexia, nausea, vomiting or diarrhea. No abdominal pain or blood.  GENITOURINARY: No burning on urination, no polyuria NEUROLOGICAL: No headache, dizziness, syncope, paralysis, ataxia, numbness or tingling in the extremities. No change in bowel or bladder control.  MUSCULOSKELETAL: No muscle, back pain, joint pain or stiffness.  LYMPHATICS: No enlarged nodes. No history of splenectomy.  PSYCHIATRIC: No history of depression or anxiety.  ENDOCRINOLOGIC: No reports of sweating, cold or heat intolerance. No polyuria or polydipsia.  Marland Kitchen   Physical Examination Vitals:   07/06/16 1201  BP: 126/77  Pulse: 83   Vitals:   07/06/16 1201  Weight: 92 lb (41.7 kg)  Height: 5\' 2"  (1.575 m)    Gen: resting comfortably, no acute distress HEENT: no scleral icterus, pupils equal round and reactive, no palptable cervical adenopathy,  CV: RRR, no m/r/g, no jvd Resp: Clear to auscultation bilaterally GI: abdomen is soft, non-tender, non-distended, normal bowel sounds, no hepatosplenomegaly MSK: extremities are warm, left chest wall is tender to palpation.  Skin: warm, no rash Neuro:  no focal deficits Psych: appropriate affect   Diagnostic Studies 01/2013 Echo Study Conclusions  - Left ventricle: The cavity size was normal. There was moderate concentric hypertrophy. Systolic function was vigorous. The estimated ejection fraction was in the range of 65% to 70%. Wall motion was normal; there were no regional wall motion abnormalities. Left ventricular diastolic function parameters were normal. - Mitral valve: Mildly thickened leaflets. Mild regurgitation. - Right atrium: Central venous pressure: 9mm Hg (est). - Atrial septum: Rounded echodensity on left atrialside of septum, approximately 75mm x 72mm, suggestive of myxoma. No stalk seen. Not very mobile. - Tricuspid valve: Trivial regurgitation. - Pulmonary arteries: PA peak pressure:  17mm Hg (S). - Pericardium, extracardiac: There was no pericardial effusion. Impressions:  - No prior study for comparison. Moderate LVH with LVEF [id number], normal diastolic function. Rounded echodensity on left atrial side of septum, approximately 67mm x 30mm, suggestive of myxoma. No stalk seen. Not very mobile.   11/2015 echo Study Conclusions  - Left ventricle: The cavity size was normal. There was mild focal basal hypertrophy of the septum. Systolic function was normal. The estimated ejection fraction was in the range of 60% to 65%. Wall motion was normal; there were no regional wall motion abnormalities. Left ventricular diastolic function parameters were normal. - Aortic valve: Mildly calcified annulus. Trileaflet. - Mitral valve: Mildly calcified leaflets . There was trivial regurgitation. - Right atrium: Central venous pressure (est): 3 mm Hg. - Tricuspid valve: There was trivial regurgitation. - Pulmonary arteries: PA peak pressure: 25 mm Hg (S). - Pericardium, extracardiac: There was no pericardial effusion.  Impressions:  - Mild basal septal LV hypertrophy with LVEF 60-65%. Normal diastolic function. Mildly calcified mitral leaflets with trivial mitral regurgitation. Trivial tricuspid regurgitation with PASP 25 mmHg. There is a rounded echodensity affixed to the left atrial side of the interatrial septum without obvious stalk. Structure measures approximately 17 mm x 17 mm. This is consistent with an atrial myxoma. This abnormality was described on the previous study in 2014 as well.   12/2015 MPI  No clearly diagnostic ST segment changes to indicate ischemia, however progressive T-wave inversion noted anterolaterally with Lexiscan in the absence of  chest pain.  Small, mild intensity, partially reversible basal anteroseptal defect that is suggestive of variable soft tissue attenuation, less likely ischemic zone but not  completely excluded.  This is a low risk study.  Nuclear stress EF: 70%.     Assessment and Plan  1. Chest pain - recent stress test without clear ischemia - current symptoms are atypical, reproducible with palpation - asked her to discuss with her hospice team who are coordinating her pain medications. No further cardiac workup. Will give Rx for prn SL NG to try for her pain.   2. 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 No further workup, she has terminal cancer and is under home hospice.   3. Afib - appears to have been isolated episode in setting of substance abuse. No detected recurrence - continue to monitor   4. Leg pains - normal ABIs - no further workup at this time.    F/u as neeed    Arnoldo Lenis, M.D.

## 2016-07-08 ENCOUNTER — Other Ambulatory Visit (HOSPITAL_COMMUNITY): Payer: Self-pay | Admitting: Oncology

## 2016-07-08 DIAGNOSIS — G620 Drug-induced polyneuropathy: Secondary | ICD-10-CM

## 2016-07-08 DIAGNOSIS — T451X5A Adverse effect of antineoplastic and immunosuppressive drugs, initial encounter: Principal | ICD-10-CM

## 2016-08-30 ENCOUNTER — Other Ambulatory Visit (HOSPITAL_COMMUNITY): Payer: Self-pay | Admitting: Oncology

## 2016-08-30 DIAGNOSIS — G620 Drug-induced polyneuropathy: Secondary | ICD-10-CM

## 2016-08-30 DIAGNOSIS — T451X5A Adverse effect of antineoplastic and immunosuppressive drugs, initial encounter: Principal | ICD-10-CM

## 2016-10-18 ENCOUNTER — Encounter (HOSPITAL_COMMUNITY): Payer: Self-pay | Admitting: *Deleted

## 2016-10-18 ENCOUNTER — Emergency Department (HOSPITAL_COMMUNITY)
Admission: EM | Admit: 2016-10-18 | Discharge: 2016-10-18 | Disposition: A | Discharge status: Home / Self Care | Payer: Medicaid Other | Attending: Emergency Medicine | Admitting: Emergency Medicine

## 2016-10-18 DIAGNOSIS — C770 Secondary and unspecified malignant neoplasm of lymph nodes of head, face and neck: Secondary | ICD-10-CM | POA: Diagnosis not present

## 2016-10-18 DIAGNOSIS — Z91040 Latex allergy status: Secondary | ICD-10-CM | POA: Diagnosis not present

## 2016-10-18 DIAGNOSIS — J449 Chronic obstructive pulmonary disease, unspecified: Secondary | ICD-10-CM | POA: Insufficient documentation

## 2016-10-18 DIAGNOSIS — G893 Neoplasm related pain (acute) (chronic): Secondary | ICD-10-CM | POA: Diagnosis not present

## 2016-10-18 DIAGNOSIS — Z79899 Other long term (current) drug therapy: Secondary | ICD-10-CM | POA: Insufficient documentation

## 2016-10-18 DIAGNOSIS — Z931 Gastrostomy status: Secondary | ICD-10-CM | POA: Diagnosis not present

## 2016-10-18 DIAGNOSIS — R52 Pain, unspecified: Secondary | ICD-10-CM

## 2016-10-18 DIAGNOSIS — J45909 Unspecified asthma, uncomplicated: Secondary | ICD-10-CM | POA: Diagnosis not present

## 2016-10-18 DIAGNOSIS — R627 Adult failure to thrive: Secondary | ICD-10-CM

## 2016-10-18 DIAGNOSIS — I10 Essential (primary) hypertension: Secondary | ICD-10-CM | POA: Diagnosis not present

## 2016-10-18 DIAGNOSIS — C799 Secondary malignant neoplasm of unspecified site: Secondary | ICD-10-CM

## 2016-10-18 DIAGNOSIS — F17210 Nicotine dependence, cigarettes, uncomplicated: Secondary | ICD-10-CM | POA: Diagnosis not present

## 2016-10-18 DIAGNOSIS — M542 Cervicalgia: Secondary | ICD-10-CM | POA: Diagnosis present

## 2016-10-18 DIAGNOSIS — Z8541 Personal history of malignant neoplasm of cervix uteri: Secondary | ICD-10-CM | POA: Diagnosis not present

## 2016-10-18 MED ORDER — HYDROMORPHONE HCL 1 MG/ML IJ SOLN
0.5000 mg | Freq: Once | INTRAMUSCULAR | Status: AC
  Administered 2016-10-18: 0.5 mg via INTRAMUSCULAR
  Filled 2016-10-18: qty 1

## 2016-10-18 MED ORDER — OXYCODONE-ACETAMINOPHEN 5-325 MG PO TABS: 1.0000 | ORAL_TABLET | Freq: Four times a day (QID) | ORAL | 0 refills | Status: DC | PRN

## 2016-10-18 MED ORDER — LIDOCAINE VISCOUS 2 % MT SOLN: 10.0000 mL | OROMUCOSAL | 2 refills | Status: DC | PRN

## 2016-10-18 MED ORDER — LORAZEPAM 0.5 MG PO TABS
0.5000 mg | ORAL_TABLET | Freq: Once | ORAL | Status: AC
  Administered 2016-10-18: 0.5 mg via ORAL
  Filled 2016-10-18: qty 1

## 2016-10-18 MED ORDER — LIDOCAINE VISCOUS 2 % MT SOLN
15.0000 mL | Freq: Once | OROMUCOSAL | Status: AC
  Administered 2016-10-18: 15 mL via OROMUCOSAL
  Filled 2016-10-18: qty 15

## 2016-10-18 NOTE — ED Provider Notes (Signed)
[facility] DEPT Provider Note   CSN: [id number] Arrival date & time: 10/18/16  1225     History   Chief Complaint Chief Complaint  Patient presents with  . Abdominal Pain  . Otalgia    HPI Molly Campbell is a 56 y.o. female.  HPI   62yF with increasing R neck/ear pain. Hx of metastatic neck CA. On hospice. No active tx. Reports worsening pain over past 3 days. No fever. Reports has been able to eat drink. Has gastric tube, but says she doesn't use it. No ear drainage. No change in hearing. No fever.   Past Medical History:  Diagnosis Date  . Alcohol abuse   . Alcoholic hepatitis 31/09/1759  . Anemia   . Anxiety disorder   . Asthma   . Auditory hallucinations   . Cancer (HCC)    cervical  . Chronic back pain   . Chronic bronchitis with acute exacerbation ([place])   . Cocaine abuse   . Constipation   . COPD (chronic obstructive pulmonary disease) ([place])   . Depression   . Dysrhythmia 2012   hx AF in er-cocaine-converted back NSR  . Emphysema   . GERD (gastroesophageal reflux disease)   . Headache   . History of cervical cancer 09/26/2013  . Hypertension   . Malignant neoplasm of tonsillar fossa ([place]) 11/12/2015  . Pneumonia    hx  . Poor circulation   . Schizophrenia ([place])   . Scoliosis   . Tobacco abuse     Patient Active Problem List   Diagnosis Date Noted  . Pulmonary emphysema ([place]) 12/15/2015  . Gastrostomy tube in place ([place]) 12/15/2015  . Alcohol use 12/15/2015  . Tobacco use 12/15/2015  . 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 Chronic periodontitis 12/13/2015  . Tonsillar cancer ([place])   . Gastric mass 11/22/2015  . Constipation 11/22/2015  . Malignant neoplasm of tonsillar fossa ([place]) 11/12/2015  . 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 Melena 12/02/2013  . Rectal pain 12/02/2013  . Abdominal pain, epigastric 12/02/2013  . GERD (gastroesophageal reflux disease) 12/02/2013  . Esophageal  dysphagia 12/02/2013  . History of cervical cancer 09/26/2013  . Mental retardation 01/07/2013  . Encephalopathy, metabolic 60/73/7106  . Chest pain 01/06/2013  . Atrial fibrillation ([place]) 01/05/2013  . Alcoholic hepatitis 26/94/8546  . HTN (hypertension) 03/14/2011  . Chest pain 03/14/2011  . Alcohol intoxication ([place]) 03/14/2011  . Tobacco abuse   . Alcohol abuse   . Drug abuse, cocaine type     Past Surgical History:  Procedure Laterality Date  . ABDOMINAL HYSTERECTOMY    . BREAST BIOPSY  2005   lt bx  . COLONOSCOPY  05/07/2002   Dr.Demason- large approximately 1.5cm polyp pedunculated aproximately 10cm from the anal verge bx= adenomatous polyp  . DIRECT LARYNGOSCOPY N/A 10/25/2015   Procedure: DIRECT LARYNGOSCOPY WITH BIOPSY OF ORAL PHARYNGEAL LESION;  Surgeon: Leta Baptist, MD;  Location: [facility];  Service: ENT;  Laterality: N/A;  DIRECT LARYNGOSCOPY WITH BIOPSY OF ORAL PHARYNGEAL LESION  . ESOPHAGOGASTRODUODENOSCOPY  06/02/2002   Dr. Anthony Sar- mild distal esophagitis w/o stricturing or ulceration. stomach and pylorus are normal. inflammatory changes of proximal duldenum in the first portion.   . ESOPHAGOGASTRODUODENOSCOPY (EGD) WITH PROPOFOL N/A 11/23/2015   Procedure: ESOPHAGOGASTRODUODENOSCOPY (EGD) WITH PROPOFOL;  Surgeon: Danie Binder, MD;  [facility];  Service: Endoscopy;  Laterality: N/A;  1245  . FRACTURE SURGERY  right wrist  [place]  . MULTIPLE EXTRACTIONS WITH ALVEOLOPLASTY N/A 12/13/2015   Procedure: Extraction of tooth #'s 2,3,4,5,6,12,13,14,15,17,and 18 with alveoloplasty, bilateral maxillary tuberosity reductions, and gross debridement of remaining teeth;  Surgeon: Lenn Cal, DDS;  Location: [facility];  Service: Oral Surgery;  Laterality: N/A;  . PEG PLACEMENT  11/18/15  . PORTACATH PLACEMENT Right 11/18/15  . TONSILLECTOMY Left 04/25/2016   Procedure: LEFT SIDE TONSILLECTOMY;  Surgeon: Leta Baptist, MD;  Location: [facility];  Service: ENT;  Laterality: Left;  . TONSILLECTOMY      OB History    Gravida Para Term Preterm AB Living   3 3 3     3    SAB TAB Ectopic Multiple Live Births                   Home Medications    Prior to Admission medications   Medication Sig Start Date End Date Taking? Authorizing Provider  albuterol (PROVENTIL HFA;VENTOLIN HFA) 108 (90 BASE) MCG/ACT inhaler Inhale 1-2 puffs into the lungs every 6 (six) hours as needed for wheezing or shortness of breath. 08/18/14   Ashley Murrain, NP  amLODipine (NORVASC) 5 MG tablet Take 5 mg by mouth daily.    [provider]  azithromycin (ZITHROMAX) 250 MG tablet Take 2 tabs on first day , then 1 tab every day thereafter until finished. 06/16/16   Baird Cancer, PA-C  DULoxetine (CYMBALTA) 30 MG capsule TAKE 1 CAPSULE BY MOUTH DAILY FOR 5 DAYS; THEN TAKE 2 CAPSULES DAILY. 08/30/16   Baird Cancer, PA-C  HYDROcodone-acetaminophen (NORCO) 5-325 MG tablet Take 1-2 tablets by mouth every 4 (four) hours as needed for moderate pain or severe pain. 06/20/16   Baird Cancer, PA-C  lidocaine (XYLOCAINE) 2 % solution Mix 1 part 2%viscous lidocaine,1part H2O.Swish and swallow 28mL of this mixture,45min before meals and at bedtime, up to QID 11/12/15   Eppie Gibson, MD  lidocaine-prilocaine (EMLA) cream Apply to affected area once 12/14/15   Baird Cancer, PA-C  Multiple Vitamins-Minerals (MULTIVITAMIN) tablet Take 1 tablet by mouth daily. 11/12/15   Eppie Gibson, MD  nitroGLYCERIN (NITROSTAT) 0.4 MG SL tablet Place 1 tablet (0.4 mg total) under the tongue every 5 (five) minutes as needed for chest pain. 07/06/16 10/04/16  Arnoldo Lenis, MD  Nutritional Supplements (FEEDING SUPPLEMENT, OSMOLITE 1.5 CAL,) LIQD 5 cans via PEG, split into 3 meals of 1.5, 1.5 and 2 cans. Flush with 150 cc water before and after each feed Patient taking differently: by PEG Tube route See admin instructions. 5 cans via PEG, split into 3 meals of 1.5, 1.5  and 2 cans. Flush with 150 cc water before and after each feed 11/19/15   [place], Kelby Fam, MD  ondansetron (ZOFRAN) 8 MG tablet Take 1 tablet (8 mg total) by mouth 2 (two) times daily as needed. Start on the third day after chemotherapy. 12/14/15   Baird Cancer, PA-C  oxyCODONE (ROXICODONE) 5 MG/5ML solution Take 5 mLs (5 mg total) by mouth every 4 (four) hours as needed for severe pain. 06/16/16   Baird Cancer, PA-C  polyethylene glycol powder (GLYCOLAX/MIRALAX) powder Take one capsule and mix in 4-6 ounces of water, take one to two times daily as needed for constipation. 11/22/15   Mahala Menghini, PA-C  potassium chloride SA (K-DUR,KLOR-CON) 20 MEQ tablet Take 1 tablet (20 mEq total) by mouth 2 (two) times daily. 04/04/16   Baird Cancer, PA-C  prochlorperazine (  COMPAZINE) 10 MG tablet Take 1 tablet (10 mg total) by mouth every 6 (six) hours as needed (Nausea or vomiting). 12/14/15   Baird Cancer, PA-C  thiamine (VITAMIN B-1) 100 MG tablet Take 1 tablet (100 mg total) by mouth daily. 11/12/15   Eppie Gibson, MD    Family History Family History  Problem Relation Age of Onset  . Alzheimer's disease Mother   . Other Father        "he was beat to death"  . Heart disease Sister   . Gout Sister   . COPD Sister   . Seizures Sister   . Diabetes Sister   . Diabetes Brother   . Cancer Paternal Grandmother        not sure what kind  . Seizures Sister   . Other Sister        "bones are messed up"  . Hypertension Brother   . Heart attack Brother   . Other Brother        MVA  . Diabetes Brother   . Other Brother        MVA  . Alcohol abuse Daughter   . Other Son        motorcycle accident  . Heart failure Other     Social History Social History  Substance Use Topics  . Smoking status: Current Every Day Smoker    Packs/day: 1.00    Years: 48.00    Types: Cigarettes  . Smokeless tobacco: Former Systems developer    Types: Snuff     Comment: One pack a day  . Alcohol use 0.0  oz/week     Comment: once a week     Allergies   Benicar [olmesartan]; Ibuprofen; and Latex   Review of Systems Review of Systems  All systems reviewed and negative, other than as noted in HPI.  Physical Exam Updated Vital Signs BP (!) 150/75 (BP Location: Right Arm)   Pulse 93   Temp 97.3 F (36.3 C) (Temporal)   Resp 18   Ht 5\' 2"  (1.575 m)   Wt 41.7 kg (92 lb)   SpO2 100%   BMI 16.83 kg/m   Physical Exam  Constitutional: She appears well-developed and well-nourished. No distress.  HENT:  Head: Normocephalic.  Right Ear: External ear normal.  Soft palate and R pharynx beefy/raw. No tongue elevation. No nuchal rigidity. No trismus. R ear/TM looks fine.   Eyes: Conjunctivae are normal. Right eye exhibits no discharge. Left eye exhibits no discharge.  Neck: Neck supple.  Cardiovascular: Normal rate, regular rhythm and normal heart sounds.  Exam reveals no gallop and no friction rub.   No murmur heard. Pulmonary/Chest: Effort normal and breath sounds normal. No respiratory distress.  Abdominal: Soft. She exhibits no distension. There is no tenderness.  Musculoskeletal: She exhibits no edema or tenderness.  Neurological: She is alert.  Skin: Skin is warm and dry.  Psychiatric: She has a normal mood and affect. Her behavior is normal. Thought content normal.  Nursing note and vitals reviewed.    ED Treatments / Results  Labs (all labs ordered are listed, but only abnormal results are displayed) Labs Reviewed - No data to display  EKG  EKG Interpretation None       Radiology No results found.  Procedures Procedures (including critical care time)  Medications Ordered in ED Medications  lidocaine (XYLOCAINE) 2 % viscous mouth solution 15 mL (not administered)  HYDROmorphone (DILAUDID) injection 0.5 mg (not administered)  LORazepam (ATIVAN) tablet  0.5 mg (not administered)     Initial Impression / Assessment and Plan / ED Course  I have reviewed the  triage vital signs and the nursing notes.  Pertinent labs & imaging results that were available during my care of the patient were reviewed by me and considered in my medical decision making (see chart for details).     55yF with R neck/ear pain and abdominal pain. Hx of metastatic CA on hospice. R soft palate/tonsillar fossa is pretty inflamed. Consistent with known hx. R ear pain most likely referred. Her ear exam is unremarkable. Diffuse tenderness on abdominal exam but soft, ND and no peritonitis. Denies n/v, change in bowel movements or urinary complaints.   Addressed goals of care. Discussed testing/imaging. She is simply requesting pain medication. She seems to have accepted her condition. Will check a UA since it's quick and non evasive although pain probably isn't from UTI.   She reports she was told she wouldn't be given new prescription until she is seen in office on June 28th. [place] database was reviewed. She had been getting regular refills of opiates/benzos several time a month for the last several months up until the beginning of May. She has not filled a prescription since that time. With her hx of metastatic CA on hospice, I don't have a major issue giving a prescription today. Will also try viscous lidocaine for her throat.  She continues to lose weight.  I suspect her weight of 92 lbs recorded today was just copied from her last weight check. She reports 82 lbs which I believe. She is not using her feeding tube as recommended. She says she swallows without significant issues currently. I encouraged her to at least flush her tube daily so she has a route to receive pain meds if she progresses to the point that she cannot take them by mouth.   Final Clinical Impressions(s) / ED Diagnoses   Final diagnoses:  Metastatic cancer ([place])  Failure to thrive in adult  Pain management    New Prescriptions New Prescriptions   No medications on file     Virgel Manifold, MD 10/30/16  [phone]

## 2016-10-18 NOTE — ED Triage Notes (Signed)
Pt comes in for right ear pain with pain in her throat starting 3 days ago. In addition, her LLQ starting hurting 3 days ago. Denies any n/v/d or urinary symptoms. Pt adds that she has cancer in multiple locations including her abdomen and throat.

## 2016-12-03 ENCOUNTER — Encounter (HOSPITAL_COMMUNITY): Payer: Self-pay | Admitting: Emergency Medicine

## 2016-12-03 ENCOUNTER — Emergency Department (HOSPITAL_COMMUNITY): Payer: Medicaid Other

## 2016-12-03 ENCOUNTER — Emergency Department (HOSPITAL_COMMUNITY)
Admission: EM | Admit: 2016-12-03 | Discharge: 2016-12-03 | Disposition: A | Discharge status: Home / Self Care | Payer: Medicaid Other | Attending: Emergency Medicine | Admitting: Emergency Medicine

## 2016-12-03 DIAGNOSIS — J45909 Unspecified asthma, uncomplicated: Secondary | ICD-10-CM | POA: Diagnosis not present

## 2016-12-03 DIAGNOSIS — Z91040 Latex allergy status: Secondary | ICD-10-CM | POA: Insufficient documentation

## 2016-12-03 DIAGNOSIS — G893 Neoplasm related pain (acute) (chronic): Secondary | ICD-10-CM | POA: Insufficient documentation

## 2016-12-03 DIAGNOSIS — C099 Malignant neoplasm of tonsil, unspecified: Secondary | ICD-10-CM | POA: Insufficient documentation

## 2016-12-03 DIAGNOSIS — F1410 Cocaine abuse, uncomplicated: Secondary | ICD-10-CM | POA: Insufficient documentation

## 2016-12-03 DIAGNOSIS — Z79899 Other long term (current) drug therapy: Secondary | ICD-10-CM | POA: Insufficient documentation

## 2016-12-03 DIAGNOSIS — R52 Pain, unspecified: Secondary | ICD-10-CM

## 2016-12-03 DIAGNOSIS — J449 Chronic obstructive pulmonary disease, unspecified: Secondary | ICD-10-CM | POA: Insufficient documentation

## 2016-12-03 DIAGNOSIS — F17210 Nicotine dependence, cigarettes, uncomplicated: Secondary | ICD-10-CM | POA: Diagnosis not present

## 2016-12-03 DIAGNOSIS — C799 Secondary malignant neoplasm of unspecified site: Secondary | ICD-10-CM

## 2016-12-03 DIAGNOSIS — Z8541 Personal history of malignant neoplasm of cervix uteri: Secondary | ICD-10-CM | POA: Insufficient documentation

## 2016-12-03 DIAGNOSIS — J029 Acute pharyngitis, unspecified: Secondary | ICD-10-CM | POA: Diagnosis present

## 2016-12-03 LAB — CBC WITH DIFFERENTIAL/PLATELET
Basophils Absolute: 0.1 K/uL (ref 0.0–0.1)
Basophils Relative: 1 %
Eosinophils Absolute: 0.1 K/uL (ref 0.0–0.7)
Eosinophils Relative: 2 %
HCT: 32.1 % — ABNORMAL LOW (ref 36.0–46.0)
Hemoglobin: 10.9 g/dL — ABNORMAL LOW (ref 12.0–15.0)
Lymphocytes Relative: 29 %
Lymphs Abs: 2.2 K/uL (ref 0.7–4.0)
MCH: 33.6 pg (ref 26.0–34.0)
MCHC: 34 g/dL (ref 30.0–36.0)
MCV: 99.1 fL (ref 78.0–100.0)
Monocytes Absolute: 0.8 K/uL (ref 0.1–1.0)
Monocytes Relative: 11 %
Neutro Abs: 4.4 K/uL (ref 1.7–7.7)
Neutrophils Relative %: 57 %
Platelets: 463 K/uL — ABNORMAL HIGH (ref 150–400)
RBC: 3.24 MIL/uL — ABNORMAL LOW (ref 3.87–5.11)
RDW: 13.3 % (ref 11.5–15.5)
WBC: 7.6 K/uL (ref 4.0–10.5)

## 2016-12-03 LAB — URINALYSIS, ROUTINE W REFLEX MICROSCOPIC
Bilirubin Urine: NEGATIVE
Glucose, UA: NEGATIVE mg/dL
Hgb urine dipstick: NEGATIVE
Ketones, ur: NEGATIVE mg/dL
Leukocytes, UA: NEGATIVE
Nitrite: NEGATIVE
Protein, ur: NEGATIVE mg/dL
Specific Gravity, Urine: 1.01 (ref 1.005–1.030)
pH: 5 (ref 5.0–8.0)

## 2016-12-03 LAB — BASIC METABOLIC PANEL
Anion gap: 8 (ref 5–15)
BUN: 14 mg/dL (ref 6–20)
CALCIUM: 8.3 mg/dL — AB (ref 8.9–10.3)
CO2: 24 mmol/L (ref 22–32)
CREATININE: 1.04 mg/dL — AB (ref 0.44–1.00)
Chloride: 107 mmol/L (ref 101–111)
GFR calc Af Amer: >60 mL/min (ref >60)
GFR, EST NON AFRICAN AMERICAN: 58 mL/min — AB (ref >60)
GLUCOSE: 101 mg/dL — AB (ref 65–99)
Potassium: 3.6 mmol/L (ref 3.5–5.1)
Sodium: 139 mmol/L (ref 135–145)

## 2016-12-03 LAB — RAPID URINE DRUG SCREEN, HOSP PERFORMED
Amphetamines: NOT DETECTED
Barbiturates: NOT DETECTED
Benzodiazepines: NOT DETECTED
Cocaine: POSITIVE — AB
Opiates: NOT DETECTED
Tetrahydrocannabinol: NOT DETECTED

## 2016-12-03 MED ORDER — LIDOCAINE VISCOUS 2 % MT SOLN
15.0000 mL | Freq: Once | OROMUCOSAL | Status: AC
  Administered 2016-12-03: 15 mL via OROMUCOSAL
  Filled 2016-12-03: qty 15

## 2016-12-03 MED ORDER — ONDANSETRON HCL 4 MG/2ML IJ SOLN
4.0000 mg | Freq: Once | INTRAMUSCULAR | Status: AC
  Administered 2016-12-03: 4 mg via INTRAVENOUS
  Filled 2016-12-03: qty 2

## 2016-12-03 MED ORDER — FENTANYL 50 MCG/HR TD PT72
100.0000 ug | MEDICATED_PATCH | TRANSDERMAL | Status: DC
  Administered 2016-12-03: 100 ug via TRANSDERMAL
  Filled 2016-12-03 (×2): qty 2

## 2016-12-03 MED ORDER — SODIUM CHLORIDE 0.9 % IV BOLUS (SEPSIS)
1000.0000 mL | Freq: Once | INTRAVENOUS | Status: AC
  Administered 2016-12-03: 1000 mL via INTRAVENOUS

## 2016-12-03 MED ORDER — FENTANYL CITRATE (PF) 100 MCG/2ML IJ SOLN
100.0000 ug | Freq: Once | INTRAMUSCULAR | Status: AC
  Administered 2016-12-03: 100 ug via INTRAVENOUS
  Filled 2016-12-03: qty 2

## 2016-12-03 MED ORDER — IOPAMIDOL (ISOVUE-300) INJECTION 61%
75.0000 mL | Freq: Once | INTRAVENOUS | Status: AC | PRN
  Administered 2016-12-03: 75 mL via INTRAVENOUS

## 2016-12-03 MED ORDER — FENTANYL CITRATE (PF) 100 MCG/2ML IJ SOLN
50.0000 ug | Freq: Once | INTRAMUSCULAR | Status: AC
  Administered 2016-12-03: 50 ug via INTRAVENOUS
  Filled 2016-12-03: qty 2

## 2016-12-03 NOTE — ED Triage Notes (Signed)
Patient c/o sore throat and right ear pain. Per patient pain x1 week. Patient does report cough with hemoptysis. Patient unsure of fevers but states "feels like I have one." Per patient terminal throat cancer.

## 2016-12-03 NOTE — ED Notes (Signed)
Pt states she has not taken her BP meds

## 2016-12-03 NOTE — Discharge Instructions (Signed)
Do not use cocaine. 

## 2016-12-03 NOTE — ED Provider Notes (Signed)
[facility] DEPT Provider Note   CSN: [id number] Arrival date & time: 12/03/16  1127     History   Chief Complaint Chief Complaint  Patient presents with  . Sore Throat    HPI Molly Campbell is a 56 y.o. female.  Pt presents today with right sided throat and ear pain.  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  The pt said her pain is getting worse.  She does have a g-tube, but has not been using it.  She said she can still swallow.  The pt gets pain meds monthly by her pcp, and she is out.  Pt said she has an appt on the 31st.  Pt said she feels like she has a fever.  She has had a cough with some hemoptysis.      Past Medical History:  Diagnosis Date  . Alcohol abuse   . Alcoholic hepatitis 40/01/8118  . Anemia   . Anxiety disorder   . Asthma   . Auditory hallucinations   . Cancer (HCC)    cervical  . Chronic back pain   . Chronic bronchitis with acute exacerbation ([place])   . Cocaine abuse   . Constipation   . COPD (chronic obstructive pulmonary disease) ([place])   . Depression   . Dysrhythmia 2012   hx AF in er-cocaine-converted back NSR  . Emphysema   . GERD (gastroesophageal reflux disease)   . Headache   . History of cervical cancer 09/26/2013  . Hypertension   . Malignant neoplasm of tonsillar fossa ([facility]) 11/12/2015  . Pneumonia    hx  . Poor circulation   . Schizophrenia ([place])   . Scoliosis   . Tobacco abuse     Patient Active Problem List   Diagnosis Date Noted  . Pulmonary emphysema ([place]) 12/15/2015  . Gastrostomy tube in place ([place]) 12/15/2015  . Alcohol use 12/15/2015  . Tobacco use 12/15/2015  . 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 Chronic periodontitis 12/13/2015  . Tonsillar cancer ([place])   . Gastric mass 11/22/2015  . Constipation 11/22/2015  . Malignant neoplasm of tonsillar fossa ([place]) 11/12/2015  . 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 Melena  12/02/2013  . Rectal pain 12/02/2013  . Abdominal pain, epigastric 12/02/2013  . GERD (gastroesophageal reflux disease) 12/02/2013  . Esophageal dysphagia 12/02/2013  . History of cervical cancer 09/26/2013  . Mental retardation 01/07/2013  . Encephalopathy, metabolic 14/78/2956  . Chest pain 01/06/2013  . Atrial fibrillation ([place]) 01/05/2013  . Alcoholic hepatitis 21/30/8657  . HTN (hypertension) 03/14/2011  . Chest pain 03/14/2011  . Alcohol intoxication ([place]) 03/14/2011  . Tobacco abuse   . Alcohol abuse   . Drug abuse, cocaine type     Past Surgical History:  Procedure Laterality Date  . ABDOMINAL HYSTERECTOMY    . BREAST BIOPSY  2005   lt bx  . COLONOSCOPY  05/07/2002   Dr.Demason- large approximately 1.5cm polyp pedunculated aproximately 10cm from the anal verge bx= adenomatous polyp  . DIRECT LARYNGOSCOPY N/A 10/25/2015   Procedure: DIRECT LARYNGOSCOPY WITH BIOPSY OF ORAL PHARYNGEAL LESION;  Surgeon: Leta Baptist, MD;  Location: [facility];  Service: ENT;  Laterality: N/A;  DIRECT LARYNGOSCOPY WITH BIOPSY OF ORAL PHARYNGEAL LESION  . ESOPHAGOGASTRODUODENOSCOPY  06/02/2002   Dr. Anthony Sar- mild distal esophagitis w/o stricturing or ulceration. stomach and pylorus are normal. inflammatory changes of proximal duldenum in the first portion.   Marland Kitchen  ESOPHAGOGASTRODUODENOSCOPY (EGD) WITH PROPOFOL N/A 11/23/2015   Procedure: ESOPHAGOGASTRODUODENOSCOPY (EGD) WITH PROPOFOL;  Surgeon: Danie Binder, MD;  [facility];  Service: Endoscopy;  Laterality: N/A;  1245  . FRACTURE SURGERY  right wrist   [place]  . MULTIPLE EXTRACTIONS WITH ALVEOLOPLASTY N/A 12/13/2015   Procedure: Extraction of tooth #'s 2,3,4,5,6,12,13,14,15,17,and 18 with alveoloplasty, bilateral maxillary tuberosity reductions, and gross debridement of remaining teeth;  Surgeon: Lenn Cal, DDS;  Location: [facility];  Service: Oral Surgery;  Laterality: N/A;  . PEG PLACEMENT  11/18/15  . PORTACATH  PLACEMENT Right 11/18/15  . TONSILLECTOMY Left 04/25/2016   Procedure: LEFT SIDE TONSILLECTOMY;  Surgeon: Leta Baptist, MD;  Location: [facility];  Service: ENT;  Laterality: Left;  . TONSILLECTOMY      OB History    Gravida Para Term Preterm AB Living   3 3 3     3    SAB TAB Ectopic Multiple Live Births                   Home Medications    Prior to Admission medications   Medication Sig Start Date End Date Taking? Authorizing Provider  albuterol (PROVENTIL HFA;VENTOLIN HFA) 108 (90 BASE) MCG/ACT inhaler Inhale 1-2 puffs into the lungs every 6 (six) hours as needed for wheezing or shortness of breath. 08/18/14  Yes Neese, [place], NP  amLODipine (NORVASC) 5 MG tablet Take 5 mg by mouth daily.   Yes [provider]  DULoxetine (CYMBALTA) 30 MG capsule TAKE 1 CAPSULE BY MOUTH DAILY FOR 5 DAYS; THEN TAKE 2 CAPSULES DAILY. 08/30/16  Yes Kefalas, Manon Hilding, PA-C  HYDROcodone-acetaminophen (NORCO) 5-325 MG tablet Take 1-2 tablets by mouth every 4 (four) hours as needed for moderate pain or severe pain. 06/20/16  Yes Kefalas, Manon Hilding, PA-C  lidocaine (XYLOCAINE) 2 % solution Use as directed 10 mLs in the mouth or throat every 4 (four) hours as needed for mouth pain. 10/18/16  Yes Virgel Manifold, MD  lidocaine-prilocaine (EMLA) cream Apply to affected area once 12/14/15  Yes Kefalas, Manon Hilding, PA-C  nitroGLYCERIN (NITROSTAT) 0.4 MG SL tablet Place 1 tablet (0.4 mg total) under the tongue every 5 (five) minutes as needed for chest pain. 07/06/16 12/03/16 Yes Branch, Alphonse Guild, MD  ondansetron (ZOFRAN) 8 MG tablet Take 1 tablet (8 mg total) by mouth 2 (two) times daily as needed. Start on the third day after chemotherapy. 12/14/15  Yes Baird Cancer, PA-C  oxyCODONE-acetaminophen (PERCOCET/ROXICET) 5-325 MG tablet Take 1-2 tablets by mouth every 6 (six) hours as needed. 10/18/16  Yes Virgel Manifold, MD  thiamine (VITAMIN B-1) 100 MG tablet Take 1 tablet (100 mg total) by mouth daily.  11/12/15  Yes Eppie Gibson, MD  lidocaine (XYLOCAINE) 2 % solution Mix 1 part 2%viscous lidocaine,1part H2O.Swish and swallow 76mL of this mixture,69min before meals and at bedtime, up to QID Patient not taking: Reported on 12/03/2016 11/12/15   Eppie Gibson, MD  Nutritional Supplements (FEEDING SUPPLEMENT, OSMOLITE 1.5 CAL,) LIQD 5 cans via PEG, split into 3 meals of 1.5, 1.5 and 2 cans. Flush with 150 cc water before and after each feed Patient not taking: Reported on 10/18/2016 11/19/15   Penland, Kelby Fam, MD  oxyCODONE (ROXICODONE) 5 MG/5ML solution Take 5 mLs (5 mg total) by mouth every 4 (four) hours as needed for severe pain. Patient not taking: Reported on 10/18/2016 06/16/16   Baird Cancer, PA-C  polyethylene glycol powder (GLYCOLAX/MIRALAX) powder  Take one capsule and mix in 4-6 ounces of water, take one to two times daily as needed for constipation. Patient not taking: Reported on 10/18/2016 11/22/15   Mahala Menghini, PA-C  potassium chloride SA (K-DUR,KLOR-CON) 20 MEQ tablet Take 1 tablet (20 mEq total) by mouth 2 (two) times daily. Patient not taking: Reported on 12/03/2016 04/04/16   Baird Cancer, PA-C    Family History Family History  Problem Relation Age of Onset  . Alzheimer's disease Mother   . Other Father        "he was beat to death"  . Heart disease Sister   . Gout Sister   . COPD Sister   . Seizures Sister   . Diabetes Sister   . Diabetes Brother   . Cancer Paternal Grandmother        not sure what kind  . Seizures Sister   . Other Sister        "bones are messed up"  . Hypertension Brother   . Heart attack Brother   . Other Brother        MVA  . Diabetes Brother   . Other Brother        MVA  . Alcohol abuse Daughter   . Other Son        motorcycle accident  . Heart failure Other     Social History Social History  Substance Use Topics  . Smoking status: Current Every Day Smoker    Packs/day: 1.00    Years: 48.00    Types: Cigarettes  .  Smokeless tobacco: Former Systems developer    Types: Snuff     Comment: One pack a day  . Alcohol use 0.0 oz/week     Comment: once a week     Allergies   Benicar [olmesartan]; Ibuprofen; Latex; and Morphine and related   Review of Systems Review of Systems  HENT: Positive for ear pain and sore throat.   Respiratory: Positive for cough.   All other systems reviewed and are negative.    Physical Exam Updated Vital Signs BP (!) 165/102   Pulse 74   Temp 98.6 F (37 C) (Oral)   Resp 18   Ht 5\' 2"  (1.575 m)   Wt 45.4 kg (100 lb)   SpO2 99%   BMI 18.29 kg/m   Physical Exam  Constitutional: She is oriented to person, place, and time. She appears well-developed and well-nourished.  HENT:  Head: Normocephalic and atraumatic.  Right Ear: External ear normal.  Left Ear: External ear normal.  Nose: Nose normal.  Right tonsillar area is beefy red and irritated with some swelling.  Eyes: Pupils are equal, round, and reactive to light. Conjunctivae and EOM are normal.  Neck: Neck supple.  Right sided firm lymphadenopathy   Cardiovascular: Normal rate, regular rhythm, normal heart sounds and intact distal pulses.   Pulmonary/Chest: Effort normal and breath sounds normal.  Abdominal: Soft. Bowel sounds are normal.  Musculoskeletal: Normal range of motion.  Neurological: She is alert and oriented to person, place, and time.  Skin: Skin is warm.  Psychiatric: Her mood appears anxious. She is agitated.  Nursing note and vitals reviewed.    ED Treatments / Results  Labs (all labs ordered are listed, but only abnormal results are displayed) Labs Reviewed  BASIC METABOLIC PANEL - Abnormal; Notable for the following:       Result Value   Glucose, Bld 101 (*)    Creatinine, Ser 1.04 (*)  Calcium 8.3 (*)    GFR calc non Af Amer 58 (*)    All other components within normal limits  CBC WITH DIFFERENTIAL/PLATELET - Abnormal; Notable for the following:    RBC 3.24 (*)    Hemoglobin 10.9  (*)    HCT 32.1 (*)    Platelets 463 (*)    All other components within normal limits  RAPID URINE DRUG SCREEN, HOSP PERFORMED - Abnormal; Notable for the following:    Cocaine POSITIVE (*)    All other components within normal limits  URINALYSIS, ROUTINE W REFLEX MICROSCOPIC    EKG  EKG Interpretation None       Radiology Dg Chest 2 View  Result Date: 12/03/2016 CLINICAL DATA:  Cough. EXAM: CHEST  2 VIEW COMPARISON:  02/25/2016 FINDINGS: There is a right chest wall port a catheter with tip in the projection of the cavoatrial junction. Normal heart size. Coarsened interstitial markings are identified bilaterally. No airspace opacities. The visualized osseous structures are unremarkable. IMPRESSION: 1. No acute cardiopulmonary abnormalities. Electronically Signed   By: Kerby Moors M.D.   On: 12/03/2016 13:41   Ct Soft Tissue Neck W Contrast  Result Date: 12/03/2016 CLINICAL DATA:  Sore throat. RIGHT ear pain. History of RIGHT tonsillar squamous cell carcinoma. Go EXAM: CT NECK WITH CONTRAST TECHNIQUE: Multidetector CT imaging of the neck was performed using the standard protocol following the bolus administration of intravenous contrast. CONTRAST:  44mL ISOVUE-300 IOPAMIDOL (ISOVUE-300) INJECTION 61% COMPARISON:  Multiple priors, most recent 02/25/2016. FINDINGS: Pharynx and larynx: Marked recurrence of tumor, with bulky RIGHT oropharyngeal mass measuring 29 x 20 mm cross-section. Exuberant superficial enhancement surrounding the base of tongue, RIGHT greater than LEFT oropharynx and nasopharynx, and uvula, consistent with mucosal and submucosal spread of tumor. This abnormal mucosal enhancement also extends into the valleculae and hypopharynx. Salivary glands: No inflammation, mass, or stone. Thyroid: Normal. Lymph nodes: Metastatic adenopathy on the RIGHT, level 2, with central necrosis. Index RIGHT level 2A node 13 mm cross-section, see sagittal image 17. Vascular: Atherosclerosis.   Patent. Limited intracranial: Negative. Visualized orbits: Negative. Mastoids and visualized paranasal sinuses: Small LEFT mastoid effusion. No significant middle ear fluid. No layering sinus fluid. Skeleton: No osseous destructive lesion. Upper chest: Severe COPD. There is a posterior segment RIGHT upper lobe nodule measuring 5 mm, which could represent metastatic disease. See image 110 series 2. Other: None. IMPRESSION: Marked progression of disease, with bulky recurrence in the RIGHT oropharynx, and exuberant pharyngeal enhancement representing mucosal and submucosal spread of tumor. Malignant RIGHT-sided level 2 adenopathy. Possible metastatic lung nodule, posterior segment RIGHT upper lobe. Electronically Signed   By: Staci Righter M.D.   On: 12/03/2016 14:10    Procedures Procedures (including critical care time)  Medications Ordered in ED Medications  fentaNYL (DURAGESIC - dosed mcg/hr) 100 mcg (not administered)  sodium chloride 0.9 % bolus 1,000 mL (0 mLs Intravenous Stopped 12/03/16 1421)  ondansetron (ZOFRAN) injection 4 mg (4 mg Intravenous Given 12/03/16 1210)  fentaNYL (SUBLIMAZE) injection 50 mcg (50 mcg Intravenous Given 12/03/16 1212)  lidocaine (XYLOCAINE) 2 % viscous mouth solution 15 mL (15 mLs Mouth/Throat Given 12/03/16 1215)  iopamidol (ISOVUE-300) 61 % injection 75 mL (75 mLs Intravenous Contrast Given 12/03/16 1334)  fentaNYL (SUBLIMAZE) injection 100 mcg (100 mcg Intravenous Given 12/03/16 1417)     Initial Impression / Assessment and Plan / ED Course  I have reviewed the triage vital signs and the nursing notes.  Pertinent labs & imaging results that were  available during my care of the patient were reviewed by me and considered in my medical decision making (see chart for details).    Pt has not had any hemoptysis here.  CT scan of neck shows marked progression of her disease in the right oropharynx.   I suspect ear pain is referred.  Pt will be given a fentanyl patch  here to see if it can help with pain management until she sees her doctor.  She is encouraged to not use cocaine.  She is told to use her g-tube for nutrition.  She knows to return if worse.  Final Clinical Impressions(s) / ED Diagnoses   Final diagnoses:  Cocaine abuse  Metastatic cancer ([place])  Pain management    New Prescriptions New Prescriptions   No medications on file     Isla Pence, MD 12/03/16 1429

## 2016-12-16 ENCOUNTER — Encounter (HOSPITAL_COMMUNITY): Payer: Self-pay | Admitting: Emergency Medicine

## 2016-12-16 ENCOUNTER — Emergency Department (HOSPITAL_COMMUNITY)
Admission: EM | Admit: 2016-12-16 | Discharge: 2016-12-16 | Disposition: A | Discharge status: Home / Self Care | Payer: Medicaid Other | Attending: Emergency Medicine | Admitting: Emergency Medicine

## 2016-12-16 DIAGNOSIS — I10 Essential (primary) hypertension: Secondary | ICD-10-CM | POA: Diagnosis not present

## 2016-12-16 DIAGNOSIS — Z8541 Personal history of malignant neoplasm of cervix uteri: Secondary | ICD-10-CM | POA: Diagnosis not present

## 2016-12-16 DIAGNOSIS — Z91040 Latex allergy status: Secondary | ICD-10-CM | POA: Diagnosis not present

## 2016-12-16 DIAGNOSIS — C099 Malignant neoplasm of tonsil, unspecified: Secondary | ICD-10-CM | POA: Insufficient documentation

## 2016-12-16 DIAGNOSIS — G893 Neoplasm related pain (acute) (chronic): Secondary | ICD-10-CM | POA: Diagnosis not present

## 2016-12-16 DIAGNOSIS — J45909 Unspecified asthma, uncomplicated: Secondary | ICD-10-CM | POA: Diagnosis not present

## 2016-12-16 DIAGNOSIS — Z79899 Other long term (current) drug therapy: Secondary | ICD-10-CM | POA: Insufficient documentation

## 2016-12-16 DIAGNOSIS — F17210 Nicotine dependence, cigarettes, uncomplicated: Secondary | ICD-10-CM | POA: Diagnosis not present

## 2016-12-16 DIAGNOSIS — J029 Acute pharyngitis, unspecified: Secondary | ICD-10-CM | POA: Diagnosis present

## 2016-12-16 DIAGNOSIS — J449 Chronic obstructive pulmonary disease, unspecified: Secondary | ICD-10-CM | POA: Diagnosis not present

## 2016-12-16 MED ORDER — HYDROCODONE-ACETAMINOPHEN 5-325 MG PO TABS
2.0000 | ORAL_TABLET | Freq: Once | ORAL | Status: AC
  Administered 2016-12-16: 2 via ORAL
  Filled 2016-12-16: qty 2

## 2016-12-16 NOTE — Discharge Instructions (Signed)

## 2016-12-16 NOTE — ED Triage Notes (Signed)
Recurrent sore throat, pt states it is difficult to swallow, spitting salvia in enemies bag in triage, no SOB, pt has knot on right side of neck, states this in not new. Pt was told she is terminally ill, can not receive further treatments.

## 2016-12-16 NOTE — ED Provider Notes (Signed)
[facility] DEPT Provider Note   CSN: [id number] Arrival date & time: 12/16/16  1157  The p   History   Chief Complaint Chief Complaint  Patient presents with  . Sore Throat    cancer patient    HPI Molly Campbell is a 56 y.o. female.  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 She was given 60 tablets of 5 mg hydrocodone on July 31. She has had pain medications filled with this frequency over the last couple of months. I reviewed the [place] drug database and this is consistent with her use of pain medication. She does have a terminal diagnosis. She has been released by oncology. She has had hospice visit her and she claims that hospice stated that she was not a candidate because she was higher functioning. She currently sees a family doctor who gives her some pain medication but she does not see a pain clinic. The pain is specifically in her right peritonsillar area, she has pain in her neck where the lymph node is swollen, she has no difficulty breathing or swallowing, this is persistent, gradually worsening but is near constant throughout the day.  Past Medical History:  Diagnosis Date  . Alcohol abuse   . Alcoholic hepatitis 00/01/3817  . Anemia   . Anxiety disorder   . Asthma   . Auditory hallucinations   . Cancer (HCC)    cervical  . Chronic back pain   . Chronic bronchitis with acute exacerbation ([place])   . Cocaine abuse   . Constipation   . COPD (chronic obstructive pulmonary disease) ([place])   . Depression   . Dysrhythmia 2012   hx AF in er-cocaine-converted back NSR  . Emphysema   . GERD (gastroesophageal reflux disease)   . Headache   . History of cervical cancer 09/26/2013  . Hypertension   . Malignant neoplasm of tonsillar fossa  ([place]) 11/12/2015  . Pneumonia    hx  . Poor circulation   . Schizophrenia ([place])   . Scoliosis   . Tobacco abuse     Patient Active Problem List   Diagnosis Date Noted  . Pulmonary emphysema ([place]) 12/15/2015  . Gastrostomy tube in place ([place]) 12/15/2015  . Alcohol use 12/15/2015  . Tobacco use 12/15/2015  . 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 Chronic periodontitis 12/13/2015  . Tonsillar cancer ([place])   . Gastric mass 11/22/2015  . Constipation 11/22/2015  . Malignant neoplasm of tonsillar fossa ([place]) 11/12/2015  . 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 Melena 12/02/2013  . Rectal pain 12/02/2013  . Abdominal pain, epigastric 12/02/2013  . GERD (gastroesophageal reflux disease) 12/02/2013  . Esophageal dysphagia 12/02/2013  . History of cervical cancer 09/26/2013  . Mental retardation 01/07/2013  . Encephalopathy, metabolic 29/93/7169  . Chest pain 01/06/2013  . Atrial fibrillation ([place]) 01/05/2013  . Alcoholic hepatitis 67/89/3810  . HTN (hypertension) 03/14/2011  . Chest pain 03/14/2011  . Alcohol intoxication ([place]) 03/14/2011  . Tobacco abuse   . Alcohol abuse   . Drug abuse, cocaine type     Past Surgical History:  Procedure Laterality Date  . ABDOMINAL HYSTERECTOMY    . BREAST BIOPSY  2005   lt bx  .  COLONOSCOPY  05/07/2002   Dr.Demason- large approximately 1.5cm polyp pedunculated aproximately 10cm from the anal verge bx= adenomatous polyp  . DIRECT LARYNGOSCOPY N/A 10/25/2015   Procedure: DIRECT LARYNGOSCOPY WITH BIOPSY OF ORAL PHARYNGEAL LESION;  Surgeon: Leta Baptist, MD;  Location: [facility];  Service: ENT;  Laterality: N/A;  DIRECT LARYNGOSCOPY WITH BIOPSY OF ORAL PHARYNGEAL LESION  . ESOPHAGOGASTRODUODENOSCOPY  06/02/2002   Dr. Anthony Sar- mild distal esophagitis w/o stricturing or ulceration. stomach and pylorus are normal. inflammatory changes of proximal duldenum in the first portion.   .  ESOPHAGOGASTRODUODENOSCOPY (EGD) WITH PROPOFOL N/A 11/23/2015   Procedure: ESOPHAGOGASTRODUODENOSCOPY (EGD) WITH PROPOFOL;  Surgeon: Danie Binder, MD;  [facility];  Service: Endoscopy;  Laterality: N/A;  1245  . FRACTURE SURGERY  right wrist     . MULTIPLE EXTRACTIONS WITH ALVEOLOPLASTY N/A 12/13/2015   Procedure: Extraction of tooth #'s 2,3,4,5,6,12,13,14,15,17,and 18 with alveoloplasty, bilateral maxillary tuberosity reductions, and gross debridement of remaining teeth;  Surgeon: Lenn Cal, DDS;  Location: [facility];  Service: Oral Surgery;  Laterality: N/A;  . PEG PLACEMENT  11/18/15  . PORTACATH PLACEMENT Right 11/18/15  . TONSILLECTOMY Left 04/25/2016   Procedure: LEFT SIDE TONSILLECTOMY;  Surgeon: Leta Baptist, MD;  Location: [facility];  Service: ENT;  Laterality: Left;  . TONSILLECTOMY      OB History    Gravida Para Term Preterm AB Living   3 3 3     3    SAB TAB Ectopic Multiple Live Births                   Home Medications    Prior to Admission medications   Medication Sig Start Date End Date Taking? Authorizing Provider  albuterol (PROVENTIL HFA;VENTOLIN HFA) 108 (90 BASE) MCG/ACT inhaler Inhale 1-2 puffs into the lungs every 6 (six) hours as needed for wheezing or shortness of breath. 08/18/14   Ashley Murrain, NP  amLODipine (NORVASC) 5 MG tablet Take 5 mg by mouth daily.    [provider]  DULoxetine (CYMBALTA) 30 MG capsule TAKE 1 CAPSULE BY MOUTH DAILY FOR 5 DAYS; THEN TAKE 2 CAPSULES DAILY. 08/30/16   Baird Cancer, PA-C  HYDROcodone-acetaminophen (NORCO) 5-325 MG tablet Take 1-2 tablets by mouth every 4 (four) hours as needed for moderate pain or severe pain. 06/20/16   Baird Cancer, PA-C  lidocaine (XYLOCAINE) 2 % solution Mix 1 part 2%viscous lidocaine,1part H2O.Swish and swallow 54mL of this mixture,73min before meals and at bedtime, up to QID Patient not taking: Reported on 12/03/2016 11/12/15   Eppie Gibson, MD    lidocaine (XYLOCAINE) 2 % solution Use as directed 10 mLs in the mouth or throat every 4 (four) hours as needed for mouth pain. 10/18/16   Virgel Manifold, MD  lidocaine-prilocaine (EMLA) cream Apply to affected area once 12/14/15   Baird Cancer, PA-C  nitroGLYCERIN (NITROSTAT) 0.4 MG SL tablet Place 1 tablet (0.4 mg total) under the tongue every 5 (five) minutes as needed for chest pain. 07/06/16 12/03/16  Arnoldo Lenis, MD  Nutritional Supplements (FEEDING SUPPLEMENT, OSMOLITE 1.5 CAL,) LIQD 5 cans via PEG, split into 3 meals of 1.5, 1.5 and 2 cans. Flush with 150 cc water before and after each feed Patient not taking: Reported on 10/18/2016 11/19/15   Penland, Kelby Fam, MD  ondansetron (ZOFRAN) 8 MG tablet Take 1 tablet (8 mg total) by mouth 2 (two) times daily as needed. Start on the third  day after chemotherapy. 12/14/15   Baird Cancer, PA-C  oxyCODONE (ROXICODONE) 5 MG/5ML solution Take 5 mLs (5 mg total) by mouth every 4 (four) hours as needed for severe pain. Patient not taking: Reported on 10/18/2016 06/16/16   Baird Cancer, PA-C  oxyCODONE-acetaminophen (PERCOCET/ROXICET) 5-325 MG tablet Take 1-2 tablets by mouth every 6 (six) hours as needed. 10/18/16   Virgel Manifold, MD  polyethylene glycol powder [facility]) powder Take one capsule and mix in 4-6 ounces of water, take one to two times daily as needed for constipation. Patient not taking: Reported on 10/18/2016 11/22/15   Mahala Menghini, PA-C  potassium chloride SA (K-DUR,KLOR-CON) 20 MEQ tablet Take 1 tablet (20 mEq total) by mouth 2 (two) times daily. Patient not taking: Reported on 12/03/2016 04/04/16   Baird Cancer, PA-C  thiamine (VITAMIN B-1) 100 MG tablet Take 1 tablet (100 mg total) by mouth daily. 11/12/15   Eppie Gibson, MD    Family History Family History  Problem Relation Age of Onset  . Alzheimer's disease Mother   . Other Father        "he was beat to death"  . Heart disease Sister   . Gout Sister    . COPD Sister   . Seizures Sister   . Diabetes Sister   . Diabetes Brother   . Cancer Paternal Grandmother        not sure what kind  . Seizures Sister   . Other Sister        "bones are messed up"  . Hypertension Brother   . Heart attack Brother   . Other Brother        MVA  . Diabetes Brother   . Other Brother        MVA  . Alcohol abuse Daughter   . Other Son        motorcycle accident  . Heart failure Other     Social History Social History  Substance Use Topics  . Smoking status: Current Every Day Smoker    Packs/day: 1.00    Years: 48.00    Types: Cigarettes  . Smokeless tobacco: Former Systems developer    Types: Snuff     Comment: One pack a day  . Alcohol use 0.0 oz/week     Comment: once a week     Allergies   Benicar [olmesartan]; Ibuprofen; Latex; and Morphine and related   Review of Systems Review of Systems  Constitutional: Negative for fever.  HENT: Positive for sore throat.   Respiratory: Negative for shortness of breath.      Physical Exam Updated Vital Signs BP (!) 184/73 (BP Location: Right Arm)   Pulse 80   Temp 98.8 F (37.1 C) (Oral)   Resp 20   SpO2 100%   Physical Exam  Constitutional: She appears well-developed and well-nourished.  HENT:  Head: Normocephalic and atraumatic.  Oropharynx is clear and moist, swelling in the right peritonsillar area, no exudate, there is asymmetry, this is chronic. No trismus or torticollis.  Eyes: Conjunctivae are normal. Right eye exhibits no discharge. Left eye exhibits no discharge.  Neck:  Lymphadenopathy which is tender in the right upper anterior cervical chain, supple neck otherwise, phonation is normal  Pulmonary/Chest: Effort normal. No respiratory distress.  Neurological: She is alert. Coordination normal.  Skin: Skin is warm and dry. No rash noted. She is not diaphoretic. No erythema.  Psychiatric: She has a normal mood and affect.  Nursing note and vitals reviewed.  ED Treatments /  Results  Labs (all labs ordered are listed, but only abnormal results are displayed) Labs Reviewed - No data to display   Radiology No results found.  Procedures Procedures (including critical care time)  Medications Ordered in ED Medications  HYDROcodone-acetaminophen (NORCO/VICODIN) 5-325 MG per tablet 2 tablet (not administered)     Initial Impression / Assessment and Plan / ED Course  I have reviewed the triage vital signs and the nursing notes.  Pertinent labs & imaging results that were available during my care of the patient were reviewed by me and considered in my medical decision making (see chart for details).     Needs Pain clinic, her family doctor is aware of her condition and needs to refer her to hospice and pain clinic. She will not be given a prescription for pain medications though I will treat her pain with hydrocodone today. The patient expressed her understanding. She does not appear to have a emergent airway, she has the ability to eat and drink, she does not have a fever or hypoxia or tachycardia.  Final Clinical Impressions(s) / ED Diagnoses   Final diagnoses:  Chronic pain due to neoplasm    New Prescriptions New Prescriptions   No medications on file     Noemi Chapel, MD 12/16/16 1231

## 2016-12-22 ENCOUNTER — Encounter (HOSPITAL_COMMUNITY): Payer: Self-pay | Admitting: *Deleted

## 2016-12-22 ENCOUNTER — Emergency Department (HOSPITAL_COMMUNITY)
Admission: EM | Admit: 2016-12-22 | Discharge: 2016-12-22 | Disposition: A | Discharge status: Home / Self Care | Payer: Medicaid Other | Attending: Emergency Medicine | Admitting: Emergency Medicine

## 2016-12-22 DIAGNOSIS — F17210 Nicotine dependence, cigarettes, uncomplicated: Secondary | ICD-10-CM | POA: Insufficient documentation

## 2016-12-22 DIAGNOSIS — G893 Neoplasm related pain (acute) (chronic): Secondary | ICD-10-CM | POA: Diagnosis not present

## 2016-12-22 DIAGNOSIS — J45909 Unspecified asthma, uncomplicated: Secondary | ICD-10-CM | POA: Diagnosis not present

## 2016-12-22 DIAGNOSIS — Z79899 Other long term (current) drug therapy: Secondary | ICD-10-CM | POA: Diagnosis not present

## 2016-12-22 DIAGNOSIS — J449 Chronic obstructive pulmonary disease, unspecified: Secondary | ICD-10-CM | POA: Insufficient documentation

## 2016-12-22 DIAGNOSIS — J029 Acute pharyngitis, unspecified: Secondary | ICD-10-CM | POA: Diagnosis not present

## 2016-12-22 DIAGNOSIS — I10 Essential (primary) hypertension: Secondary | ICD-10-CM | POA: Diagnosis present

## 2016-12-22 MED ORDER — HYDROCODONE-ACETAMINOPHEN 5-325 MG PO TABS
1.0000 | ORAL_TABLET | Freq: Once | ORAL | Status: AC
  Administered 2016-12-22: 1 via ORAL
  Filled 2016-12-22: qty 1

## 2016-12-22 MED ORDER — AMLODIPINE BESYLATE 5 MG PO TABS
5.0000 mg | ORAL_TABLET | Freq: Once | ORAL | Status: AC
  Administered 2016-12-22: 5 mg via ORAL
  Filled 2016-12-22: qty 1

## 2016-12-22 NOTE — ED Notes (Signed)
[place] regarding pt stating she was out of bp meds.  Pharmacy states pt has 3 refills on this medication and she just needs to come pick it up.  Pt made aware and then asked if provider was going to order her pain medications as she was out of those.  PA made aware.

## 2016-12-22 NOTE — ED Provider Notes (Signed)
[facility] DEPT Provider Note   CSN: [id number] Arrival date & time: 12/22/16  1015     History   Chief Complaint Chief Complaint  Patient presents with  . Hypertension    HPI Molly Campbell is a 56 y.o. female with a history as outlined below presenting for assistance with blood pressure medication prescriptions.  She takes amlodipine daily and took her last tablet yesterday morning.  She was unable to see her pcp today so presented here. She denies headache, vision changes, cp, sob, dizziness but does endorse chronic neck and throat pain secondary to terminal tonsillar cancer not amenable to chemo, radiation or surgical intervention. She is also out of her pain medication.  She was referred to hospice but told she was not a candidate for their services for unclear reasons.  She does continue using cocaine, stating last use was 2 weeks ago.  She denies difficulty eating or drinking.  She has a G tube in place but is not currently using. Her pain is being treated by her pcp Dr.Gosrani.   The history is provided by the patient.    Past Medical History:  Diagnosis Date  . Alcohol abuse   . Alcoholic hepatitis 83/05/5174  . Anemia   . Anxiety disorder   . Asthma   . Auditory hallucinations   . Cancer (HCC)    cervical  . Chronic back pain   . Chronic bronchitis with acute exacerbation ([place])   . Cocaine abuse   . Constipation   . COPD (chronic obstructive pulmonary disease) ([place])   . Depression   . Dysrhythmia 2012   hx AF in er-cocaine-converted back NSR  . Emphysema   . GERD (gastroesophageal reflux disease)   . Headache   . History of cervical cancer 09/26/2013  . Hypertension   . Malignant neoplasm of tonsillar fossa ([place]) 11/12/2015  . Pneumonia    hx  . Poor circulation   . Schizophrenia ([place])   . Scoliosis   . Tobacco abuse     Patient Active Problem List   Diagnosis Date Noted  . Pulmonary emphysema ([place]) 12/15/2015  . Gastrostomy tube in place ([place])  12/15/2015  . Alcohol use 12/15/2015  . Tobacco use 12/15/2015  . 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 Chronic periodontitis 12/13/2015  . Tonsillar cancer ([place])   . Gastric mass 11/22/2015  . Constipation 11/22/2015  . Malignant neoplasm of tonsillar fossa ([place]) 11/12/2015  . 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 Melena 12/02/2013  . Rectal pain 12/02/2013  . Abdominal pain, epigastric 12/02/2013  . GERD (gastroesophageal reflux disease) 12/02/2013  . Esophageal dysphagia 12/02/2013  . History of cervical cancer 09/26/2013  . Mental retardation 01/07/2013  . Encephalopathy, metabolic 16/11/3708  . Chest pain 01/06/2013  . Atrial fibrillation ([place]) 01/05/2013  . Alcoholic hepatitis 62/69/4854  . HTN (hypertension) 03/14/2011  . Chest pain 03/14/2011  . Alcohol intoxication ([place]) 03/14/2011  . Tobacco abuse   . Alcohol abuse   . Drug abuse, cocaine type     Past Surgical History:  Procedure Laterality Date  . ABDOMINAL HYSTERECTOMY    . BREAST BIOPSY  2005   lt bx  . COLONOSCOPY  05/07/2002   Dr.Demason- large approximately 1.5cm polyp pedunculated aproximately 10cm from the anal verge bx= adenomatous polyp  . DIRECT LARYNGOSCOPY N/A 10/25/2015   Procedure: DIRECT LARYNGOSCOPY WITH BIOPSY OF ORAL PHARYNGEAL LESION;  Surgeon: Leta Baptist, MD;  Location: [facility];  Service: ENT;  Laterality: N/A;  DIRECT LARYNGOSCOPY WITH BIOPSY OF ORAL PHARYNGEAL LESION  . ESOPHAGOGASTRODUODENOSCOPY  06/02/2002   Dr. Anthony Sar- mild distal esophagitis w/o stricturing or ulceration. stomach and pylorus are normal. inflammatory changes of proximal duldenum in the first portion.   . ESOPHAGOGASTRODUODENOSCOPY (EGD) WITH PROPOFOL N/A 11/23/2015   Procedure: ESOPHAGOGASTRODUODENOSCOPY (EGD) WITH PROPOFOL;  Surgeon: Danie Binder, MD;  [facility];  Service: Endoscopy;  Laterality: N/A;  1245  . FRACTURE SURGERY  right wrist     [place]  . MULTIPLE EXTRACTIONS WITH ALVEOLOPLASTY N/A 12/13/2015   Procedure: Extraction of tooth #'s 2,3,4,5,6,12,13,14,15,17,and 18 with alveoloplasty, bilateral maxillary tuberosity reductions, and gross debridement of remaining teeth;  Surgeon: Lenn Cal, DDS;  Location: [facility];  Service: Oral Surgery;  Laterality: N/A;  . PEG PLACEMENT  11/18/15  . PORTACATH PLACEMENT Right 11/18/15  . TONSILLECTOMY Left 04/25/2016   Procedure: LEFT SIDE TONSILLECTOMY;  Surgeon: Leta Baptist, MD;  Location: [facility];  Service: ENT;  Laterality: Left;  . TONSILLECTOMY      OB History    Gravida Para Term Preterm AB Living   3 3 3     3    SAB TAB Ectopic Multiple Live Births                   Home Medications    Prior to Admission medications   Medication Sig Start Date End Date Taking? Authorizing Provider  albuterol (PROVENTIL HFA;VENTOLIN HFA) 108 (90 BASE) MCG/ACT inhaler Inhale 1-2 puffs into the lungs every 6 (six) hours as needed for wheezing or shortness of breath. 08/18/14  Yes Neese, [place], NP  amLODipine (NORVASC) 5 MG tablet Take 5 mg by mouth daily.   Yes [provider]  DULoxetine (CYMBALTA) 30 MG capsule TAKE 1 CAPSULE BY MOUTH DAILY FOR 5 DAYS; THEN TAKE 2 CAPSULES DAILY. 08/30/16  Yes Kefalas, Manon Hilding, PA-C  HYDROcodone-acetaminophen (NORCO) 5-325 MG tablet Take 1-2 tablets by mouth every 4 (four) hours as needed for moderate pain or severe pain. 06/20/16  Yes Baird Cancer, PA-C  lidocaine-prilocaine (EMLA) cream Apply to affected area once 12/14/15  Yes Kefalas, Manon Hilding, PA-C  lidocaine (XYLOCAINE) 2 % solution Mix 1 part 2%viscous lidocaine,1part H2O.Swish and swallow 28mL of this mixture,67min before meals and at bedtime, up to QID Patient not taking: Reported on 12/03/2016 11/12/15   Eppie Gibson, MD  lidocaine (XYLOCAINE) 2 % solution Use as directed 10 mLs in the mouth or throat every 4 (four) hours as needed for mouth pain. 10/18/16   Virgel Manifold, MD  nitroGLYCERIN (NITROSTAT) 0.4 MG SL tablet Place 1 tablet (0.4 mg total) under the tongue every 5 (five) minutes as needed for chest pain. 07/06/16 12/03/16  Arnoldo Lenis, MD  Nutritional Supplements (FEEDING SUPPLEMENT, OSMOLITE 1.5 CAL,) LIQD 5 cans via PEG, split into 3 meals of 1.5, 1.5 and 2 cans. Flush with 150 cc water before and after each feed Patient not taking: Reported on 10/18/2016 11/19/15   Penland, Kelby Fam, MD  oxyCODONE (ROXICODONE) 5 MG/5ML solution Take 5 mLs (5 mg total) by mouth every 4 (four) hours as needed for severe pain. Patient not taking: Reported on 10/18/2016 06/16/16   Baird Cancer, PA-C  oxyCODONE-acetaminophen (PERCOCET/ROXICET) 5-325 MG tablet Take 1-2 tablets by mouth every 6 (six) hours as needed. 10/18/16   Virgel Manifold, MD  polyethylene glycol powder [facility]) powder Take one capsule and mix in 4-6 ounces  of water, take one to two times daily as needed for constipation. Patient not taking: Reported on 10/18/2016 11/22/15   Mahala Menghini, PA-C    Family History Family History  Problem Relation Age of Onset  . Alzheimer's disease Mother   . Other Father        "he was beat to death"  . Heart disease Sister   . Gout Sister   . COPD Sister   . Seizures Sister   . Diabetes Sister   . Diabetes Brother   . Cancer Paternal Grandmother        not sure what kind  . Seizures Sister   . Other Sister        "bones are messed up"  . Hypertension Brother   . Heart attack Brother   . Other Brother        MVA  . Diabetes Brother   . Other Brother        MVA  . Alcohol abuse Daughter   . Other Son        motorcycle accident  . Heart failure Other     Social History Social History  Substance Use Topics  . Smoking status: Current Every Day Smoker    Packs/day: 1.00    Years: 48.00    Types: Cigarettes  . Smokeless tobacco: Former Systems developer    Types: Snuff     Comment: One pack a day  . Alcohol use 0.0 oz/week     Comment:  once a week     Allergies   Benicar [olmesartan]; Ibuprofen; Latex; and Morphine and related   Review of Systems Review of Systems  Constitutional: Negative for chills and fever.  HENT: Positive for sore throat. Negative for congestion.   Eyes: Negative.   Respiratory: Negative for chest tightness, shortness of breath, wheezing and stridor.   Cardiovascular: Negative for chest pain.  Gastrointestinal: Negative for abdominal pain and nausea.  Genitourinary: Negative.   Musculoskeletal: Negative for arthralgias, joint swelling and neck pain.  Skin: Negative.  Negative for rash and wound.  Neurological: Negative for dizziness, weakness, light-headedness, numbness and headaches.  Psychiatric/Behavioral: Negative.      Physical Exam Updated Vital Signs BP (!) 200/105   Pulse 67   Temp 98.6 F (37 C)   Resp 20   Ht 5\' 2"  (1.575 m)   Wt 39.9 kg (88 lb)   SpO2 100%   BMI 16.10 kg/m   Physical Exam  Constitutional: She appears well-developed and well-nourished.  HENT:  Head: Normocephalic and atraumatic.  Nose: Nose normal.  Oropharynx is clear and moist, swelling in the right peritonsillar area, no exudate, there is asymmetry, this is chronic. No trismus or torticollis.  Eyes: Conjunctivae are normal. Right eye exhibits no discharge. Left eye exhibits no discharge.  Neck:  Lymphadenopathy which is tender in the right upper anterior cervical chain.  No stridor.  Cardiovascular: Normal rate, regular rhythm and normal heart sounds.   Pulmonary/Chest: Effort normal. No respiratory distress.  Neurological: She is alert. Coordination normal.  Skin: Skin is warm and dry. No rash noted. She is not diaphoretic. No erythema.  Psychiatric: She has a normal mood and affect.  Nursing note and vitals reviewed.    ED Treatments / Results  Labs (all labs ordered are listed, but only abnormal results are displayed) Labs Reviewed - No data to display  EKG  EKG  Interpretation None       Radiology No results found.  Procedures Procedures (  including critical care time)  Medications Ordered in ED Medications  amLODipine (NORVASC) tablet 5 mg (not administered)  HYDROcodone-acetaminophen (NORCO/VICODIN) 5-325 MG per tablet 1 tablet (not administered)     Initial Impression / Assessment and Plan / ED Course  I have reviewed the triage vital signs and the nursing notes.  Pertinent labs & imaging results that were available during my care of the patient were reviewed by me and considered in my medical decision making (see chart for details).     RN placed call to pt's pharmacy who advised she has 3 refills of her bp medication.  She was informed of this and will pick up when she leaves here.  She was given todays am dose prior to dc home.  Also given a dose of pain medication (brother driving home).  Advised f/u with Dr Anastasio Champion for further pain management and support as her terminal illness progresses.  May need a re-evaluation by hospice for any new disability or progression of illness which was discussed with pt.    Final Clinical Impressions(s) / ED Diagnoses   Final diagnoses:  Hypertension, unspecified type  Cancer associated pain    New Prescriptions New Prescriptions   No medications on file     Landis Martins 12/22/16 1710    Fredia Sorrow, MD 12/24/16 2149

## 2016-12-22 NOTE — Discharge Instructions (Signed)
You do have refills of your blood pressure medicine - please call your drug store to get this medicine refilled.  You were given a dose of pain medicine here, your brother will need to drive your home.  Call Dr. Anastasio Champion for further treatment of your pain.

## 2016-12-22 NOTE — ED Triage Notes (Signed)
Pain in right side of throat and ear related to thraoat cancer, out of blood pressure medication

## 2016-12-27 ENCOUNTER — Emergency Department (HOSPITAL_COMMUNITY)
Admission: EM | Admit: 2016-12-27 | Discharge: 2016-12-27 | Disposition: A | Discharge status: Home / Self Care | Payer: Medicaid Other | Attending: Emergency Medicine | Admitting: Emergency Medicine

## 2016-12-27 ENCOUNTER — Encounter (HOSPITAL_COMMUNITY): Payer: Self-pay

## 2016-12-27 ENCOUNTER — Emergency Department (HOSPITAL_COMMUNITY): Payer: Medicaid Other

## 2016-12-27 DIAGNOSIS — Z91040 Latex allergy status: Secondary | ICD-10-CM | POA: Diagnosis not present

## 2016-12-27 DIAGNOSIS — Z931 Gastrostomy status: Secondary | ICD-10-CM | POA: Insufficient documentation

## 2016-12-27 DIAGNOSIS — F17210 Nicotine dependence, cigarettes, uncomplicated: Secondary | ICD-10-CM | POA: Diagnosis not present

## 2016-12-27 DIAGNOSIS — Z79899 Other long term (current) drug therapy: Secondary | ICD-10-CM | POA: Diagnosis not present

## 2016-12-27 DIAGNOSIS — R1310 Dysphagia, unspecified: Secondary | ICD-10-CM | POA: Diagnosis present

## 2016-12-27 DIAGNOSIS — J449 Chronic obstructive pulmonary disease, unspecified: Secondary | ICD-10-CM | POA: Insufficient documentation

## 2016-12-27 DIAGNOSIS — Z8541 Personal history of malignant neoplasm of cervix uteri: Secondary | ICD-10-CM | POA: Diagnosis not present

## 2016-12-27 DIAGNOSIS — I10 Essential (primary) hypertension: Secondary | ICD-10-CM | POA: Insufficient documentation

## 2016-12-27 DIAGNOSIS — J45909 Unspecified asthma, uncomplicated: Secondary | ICD-10-CM | POA: Insufficient documentation

## 2016-12-27 DIAGNOSIS — C109 Malignant neoplasm of oropharynx, unspecified: Secondary | ICD-10-CM | POA: Insufficient documentation

## 2016-12-27 LAB — CBC WITH DIFFERENTIAL/PLATELET
BASOS PCT: 0 %
Basophils Absolute: 0 10*3/uL (ref 0.0–0.1)
EOS ABS: 0.1 10*3/uL (ref 0.0–0.7)
EOS PCT: 1 %
HCT: 36.1 % (ref 36.0–46.0)
Hemoglobin: 12.2 g/dL (ref 12.0–15.0)
LYMPHS ABS: 2.3 10*3/uL (ref 0.7–4.0)
Lymphocytes Relative: 31 %
MCH: 32.8 pg (ref 26.0–34.0)
MCHC: 33.8 g/dL (ref 30.0–36.0)
MCV: 97 fL (ref 78.0–100.0)
Monocytes Absolute: 0.6 10*3/uL (ref 0.1–1.0)
Monocytes Relative: 8 %
Neutro Abs: 4.5 10*3/uL (ref 1.7–7.7)
Neutrophils Relative %: 60 %
PLATELETS: 597 10*3/uL — AB (ref 150–400)
RBC: 3.72 MIL/uL — AB (ref 3.87–5.11)
RDW: 12.9 % (ref 11.5–15.5)
WBC: 7.6 10*3/uL (ref 4.0–10.5)

## 2016-12-27 LAB — BASIC METABOLIC PANEL
Anion gap: 9 (ref 5–15)
BUN: 30 mg/dL — AB (ref 6–20)
CO2: 26 mmol/L (ref 22–32)
CREATININE: 1.97 mg/dL — AB (ref 0.44–1.00)
Calcium: 9.6 mg/dL (ref 8.9–10.3)
Chloride: 104 mmol/L (ref 101–111)
GFR, EST AFRICAN AMERICAN: 31 mL/min — AB (ref >60)
GFR, EST NON AFRICAN AMERICAN: 27 mL/min — AB (ref >60)
Glucose, Bld: 97 mg/dL (ref 65–99)
POTASSIUM: 3.4 mmol/L — AB (ref 3.5–5.1)
SODIUM: 139 mmol/L (ref 135–145)

## 2016-12-27 MED ORDER — SODIUM CHLORIDE 0.9 % IV BOLUS (SEPSIS)
1000.0000 mL | Freq: Once | INTRAVENOUS | Status: AC
  Administered 2016-12-27: 1000 mL via INTRAVENOUS

## 2016-12-27 MED ORDER — IOPAMIDOL (ISOVUE-300) INJECTION 61%
60.0000 mL | Freq: Once | INTRAVENOUS | Status: AC | PRN
  Administered 2016-12-27: 60 mL via INTRAVENOUS

## 2016-12-27 MED ORDER — HYDROCODONE-ACETAMINOPHEN 5-325 MG PO TABS: 1.0000 | ORAL_TABLET | ORAL | 0 refills | Status: AC | PRN

## 2016-12-27 MED ORDER — HYDROMORPHONE HCL 1 MG/ML IJ SOLN
0.5000 mg | Freq: Once | INTRAMUSCULAR | Status: AC
  Administered 2016-12-27: 0.5 mg via INTRAVENOUS
  Filled 2016-12-27: qty 1

## 2016-12-27 NOTE — ED Notes (Signed)
Pt c/o pain. Pt reports she has been taking 6 tablets of her Hydrocodone at home daily and she is out of medication but it isn't time for it be refilled by her PCP.

## 2016-12-27 NOTE — Care Management (Signed)
CM consult received for hospice resources. Pt has end stage cancer. Was DC'd from [facility] in May, has since gone downhill. Currently living alone, struggles to care for herself, sister at bedside [name]) interested in them getting home together. [name] reports pt's home has mold, landlord refusing to fix, DSS has been contacted by family with no relief. Pt agreeable to any Hospice services through any agency that will take her. [facility] contacted and can enroll pt. Rep will make visit to pt in ED. Pt may need face to face and Hospice rep will need to get order from pt's PCP. PCP out of office and rep will obtain order tomorrow.

## 2016-12-27 NOTE — ED Triage Notes (Signed)
Pt brought in from home by EMS. Pt has hx of throat cancer. Pt having increasing trouble swallowing . Spitting up thick white mucous. Pt also reports some SOB that is better now, also back spasms. Pt spitting out secretions due to painful swallowing and difficulty swallowing

## 2016-12-27 NOTE — Discharge Instructions (Signed)
Our case manager will attempt to work on having another hospice service coming to take care of you.  Please return for worsening symptoms, including difficulty breathing, confusion, fever, or any other symptoms concerning to you.  Your mass does not involve your airway, but you are given ENT phone number to consult whether you may eventually need tracheostomy.

## 2016-12-27 NOTE — ED Notes (Signed)
Hospice nurse just arrived right when the pt was leaving. Able to have consult.
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
## 2016-12-27 NOTE — ED Notes (Signed)
Pt waiting for hospice nurse to come before DC

## 2016-12-27 NOTE — ED Notes (Signed)
Pt is leaving without being seen by the Hospice Nurse. MD and this nurse has tried to get pt to stay. States she" is leaving and they can call her." Discussed with MD.

## 2016-12-28 ENCOUNTER — Ambulatory Visit (INDEPENDENT_AMBULATORY_CARE_PROVIDER_SITE_OTHER): Payer: Medicaid Other | Admitting: General Surgery

## 2016-12-28 ENCOUNTER — Encounter: Payer: Self-pay | Admitting: General Surgery

## 2016-12-28 VITALS — BP 155/88 | HR 76 | Temp 98.1°F | Resp 18 | Ht 62.0 in | Wt 83.0 lb

## 2016-12-28 DIAGNOSIS — C090 Malignant neoplasm of tonsillar fossa: Secondary | ICD-10-CM | POA: Diagnosis not present
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
## 2017-01-02 ENCOUNTER — Inpatient Hospital Stay (HOSPITAL_COMMUNITY)
Admission: EM | Admit: 2017-01-02 | Discharge: 2017-01-03 | DRG: 682 | Discharge status: Against Medical Advice | Payer: Medicaid Other | Attending: Internal Medicine | Admitting: Internal Medicine

## 2017-01-02 ENCOUNTER — Encounter (HOSPITAL_COMMUNITY): Payer: Self-pay | Admitting: *Deleted

## 2017-01-02 DIAGNOSIS — Z9221 Personal history of antineoplastic chemotherapy: Secondary | ICD-10-CM

## 2017-01-02 DIAGNOSIS — I4891 Unspecified atrial fibrillation: Secondary | ICD-10-CM | POA: Diagnosis present

## 2017-01-02 DIAGNOSIS — N179 Acute kidney failure, unspecified: Principal | ICD-10-CM

## 2017-01-02 DIAGNOSIS — K7010 Alcoholic hepatitis without ascites: Secondary | ICD-10-CM | POA: Diagnosis present

## 2017-01-02 DIAGNOSIS — F1010 Alcohol abuse, uncomplicated: Secondary | ICD-10-CM | POA: Diagnosis present

## 2017-01-02 DIAGNOSIS — Z825 Family history of asthma and other chronic lower respiratory diseases: Secondary | ICD-10-CM

## 2017-01-02 DIAGNOSIS — R52 Pain, unspecified: Secondary | ICD-10-CM

## 2017-01-02 DIAGNOSIS — G8929 Other chronic pain: Secondary | ICD-10-CM | POA: Diagnosis present

## 2017-01-02 DIAGNOSIS — F209 Schizophrenia, unspecified: Secondary | ICD-10-CM | POA: Diagnosis present

## 2017-01-02 DIAGNOSIS — E43 Unspecified severe protein-calorie malnutrition: Secondary | ICD-10-CM

## 2017-01-02 DIAGNOSIS — R51 Headache: Secondary | ICD-10-CM | POA: Diagnosis present

## 2017-01-02 DIAGNOSIS — M419 Scoliosis, unspecified: Secondary | ICD-10-CM | POA: Diagnosis present

## 2017-01-02 DIAGNOSIS — E860 Dehydration: Secondary | ICD-10-CM | POA: Diagnosis not present

## 2017-01-02 DIAGNOSIS — Z681 Body mass index (BMI) 19 or less, adult: Secondary | ICD-10-CM

## 2017-01-02 DIAGNOSIS — G893 Neoplasm related pain (acute) (chronic): Secondary | ICD-10-CM | POA: Diagnosis present

## 2017-01-02 DIAGNOSIS — Z7189 Other specified counseling: Secondary | ICD-10-CM | POA: Diagnosis not present

## 2017-01-02 DIAGNOSIS — Z8541 Personal history of malignant neoplasm of cervix uteri: Secondary | ICD-10-CM

## 2017-01-02 DIAGNOSIS — Z515 Encounter for palliative care: Secondary | ICD-10-CM | POA: Diagnosis not present

## 2017-01-02 DIAGNOSIS — Z8249 Family history of ischemic heart disease and other diseases of the circulatory system: Secondary | ICD-10-CM

## 2017-01-02 DIAGNOSIS — Z931 Gastrostomy status: Secondary | ICD-10-CM | POA: Diagnosis not present

## 2017-01-02 DIAGNOSIS — R54 Age-related physical debility: Secondary | ICD-10-CM | POA: Diagnosis present

## 2017-01-02 DIAGNOSIS — F329 Major depressive disorder, single episode, unspecified: Secondary | ICD-10-CM | POA: Diagnosis present

## 2017-01-02 DIAGNOSIS — K219 Gastro-esophageal reflux disease without esophagitis: Secondary | ICD-10-CM | POA: Diagnosis present

## 2017-01-02 DIAGNOSIS — R64 Cachexia: Secondary | ICD-10-CM | POA: Diagnosis present

## 2017-01-02 DIAGNOSIS — C099 Malignant neoplasm of tonsil, unspecified: Secondary | ICD-10-CM

## 2017-01-02 DIAGNOSIS — Z888 Allergy status to other drugs, medicaments and biological substances status: Secondary | ICD-10-CM

## 2017-01-02 DIAGNOSIS — F79 Unspecified intellectual disabilities: Secondary | ICD-10-CM | POA: Diagnosis present

## 2017-01-02 DIAGNOSIS — Z833 Family history of diabetes mellitus: Secondary | ICD-10-CM

## 2017-01-02 DIAGNOSIS — J432 Centrilobular emphysema: Secondary | ICD-10-CM | POA: Diagnosis present

## 2017-01-02 DIAGNOSIS — F419 Anxiety disorder, unspecified: Secondary | ICD-10-CM | POA: Diagnosis present

## 2017-01-02 DIAGNOSIS — Z885 Allergy status to narcotic agent status: Secondary | ICD-10-CM

## 2017-01-02 DIAGNOSIS — Z886 Allergy status to analgesic agent status: Secondary | ICD-10-CM

## 2017-01-02 DIAGNOSIS — Z90710 Acquired absence of both cervix and uterus: Secondary | ICD-10-CM | POA: Diagnosis not present

## 2017-01-02 DIAGNOSIS — Z91040 Latex allergy status: Secondary | ICD-10-CM

## 2017-01-02 DIAGNOSIS — I10 Essential (primary) hypertension: Secondary | ICD-10-CM | POA: Diagnosis present

## 2017-01-02 DIAGNOSIS — M549 Dorsalgia, unspecified: Secondary | ICD-10-CM | POA: Diagnosis present

## 2017-01-02 DIAGNOSIS — D481 Neoplasm of uncertain behavior of connective and other soft tissue: Secondary | ICD-10-CM | POA: Diagnosis present

## 2017-01-02 DIAGNOSIS — R1310 Dysphagia, unspecified: Secondary | ICD-10-CM | POA: Diagnosis present

## 2017-01-02 DIAGNOSIS — F17210 Nicotine dependence, cigarettes, uncomplicated: Secondary | ICD-10-CM | POA: Diagnosis present

## 2017-01-02 DIAGNOSIS — E876 Hypokalemia: Secondary | ICD-10-CM | POA: Diagnosis present

## 2017-01-02 DIAGNOSIS — Z923 Personal history of irradiation: Secondary | ICD-10-CM

## 2017-01-02 DIAGNOSIS — Z809 Family history of malignant neoplasm, unspecified: Secondary | ICD-10-CM

## 2017-01-02 DIAGNOSIS — Z820 Family history of epilepsy and other diseases of the nervous system: Secondary | ICD-10-CM

## 2017-01-02 LAB — URINALYSIS, ROUTINE W REFLEX MICROSCOPIC
Bilirubin Urine: NEGATIVE
GLUCOSE, UA: NEGATIVE mg/dL
HGB URINE DIPSTICK: NEGATIVE
KETONES UR: 5 mg/dL — AB
Nitrite: NEGATIVE
PH: 5 (ref 5.0–8.0)
PROTEIN: 30 mg/dL — AB
Specific Gravity, Urine: 1.021 (ref 1.005–1.030)

## 2017-01-02 LAB — BASIC METABOLIC PANEL
Anion gap: 13 (ref 5–15)
BUN: 37 mg/dL — AB (ref 6–20)
CALCIUM: 9.4 mg/dL (ref 8.9–10.3)
CO2: 23 mmol/L (ref 22–32)
CREATININE: 2.93 mg/dL — AB (ref 0.44–1.00)
Chloride: 107 mmol/L (ref 101–111)
GFR, EST AFRICAN AMERICAN: 19 mL/min — AB (ref >60)
GFR, EST NON AFRICAN AMERICAN: 17 mL/min — AB (ref >60)
GLUCOSE: 107 mg/dL — AB (ref 65–99)
Potassium: 3.1 mmol/L — ABNORMAL LOW (ref 3.5–5.1)
Sodium: 143 mmol/L (ref 135–145)

## 2017-01-02 LAB — CBC WITH DIFFERENTIAL/PLATELET
BASOS ABS: 0 10*3/uL (ref 0.0–0.1)
BASOS PCT: 0 %
Eosinophils Absolute: 0.2 10*3/uL (ref 0.0–0.7)
Eosinophils Relative: 2 %
HEMATOCRIT: 36.1 % (ref 36.0–46.0)
Hemoglobin: 12.3 g/dL (ref 12.0–15.0)
Lymphocytes Relative: 16 %
Lymphs Abs: 1.7 10*3/uL (ref 0.7–4.0)
MCH: 32.9 pg (ref 26.0–34.0)
MCHC: 34.1 g/dL (ref 30.0–36.0)
MCV: 96.5 fL (ref 78.0–100.0)
MONO ABS: 0.7 10*3/uL (ref 0.1–1.0)
MONOS PCT: 6 %
NEUTROS ABS: 8.2 10*3/uL — AB (ref 1.7–7.7)
Neutrophils Relative %: 76 %
PLATELETS: 713 10*3/uL — AB (ref 150–400)
RBC: 3.74 MIL/uL — ABNORMAL LOW (ref 3.87–5.11)
RDW: 12.8 % (ref 11.5–15.5)
WBC: 10.8 10*3/uL — ABNORMAL HIGH (ref 4.0–10.5)

## 2017-01-02 MED ORDER — HYDROMORPHONE HCL 1 MG/ML IJ SOLN
1.0000 mg | Freq: Once | INTRAMUSCULAR | Status: AC
  Administered 2017-01-02: 1 mg via INTRAVENOUS
  Filled 2017-01-02: qty 1

## 2017-01-02 MED ORDER — SODIUM CHLORIDE 0.9 % IV SOLN
INTRAVENOUS | Status: DC
  Administered 2017-01-02 – 2017-01-03 (×3): via INTRAVENOUS

## 2017-01-02 MED ORDER — PRO-STAT SUGAR FREE PO LIQD
30.0000 mL | Freq: Every day | ORAL | Status: DC
Start: 2017-01-02 — End: 2017-01-03
  Administered 2017-01-02 – 2017-01-03 (×2): 30 mL
  Filled 2017-01-02 (×2): qty 30

## 2017-01-02 MED ORDER — ONDANSETRON HCL 4 MG PO TABS
4.0000 mg | ORAL_TABLET | Freq: Four times a day (QID) | ORAL | Status: DC | PRN
  Filled 2017-01-02: qty 1

## 2017-01-02 MED ORDER — ONDANSETRON HCL 4 MG/2ML IJ SOLN
4.0000 mg | Freq: Once | INTRAMUSCULAR | Status: AC
  Administered 2017-01-02: 4 mg via INTRAVENOUS
  Filled 2017-01-02: qty 2

## 2017-01-02 MED ORDER — OSMOLITE 1.5 CAL PO LIQD
1000.0000 mL | ORAL | Status: DC
  Administered 2017-01-02: 1000 mL
  Filled 2017-01-02 (×3): qty 1000

## 2017-01-02 MED ORDER — DULOXETINE HCL 30 MG PO CPEP
60.0000 mg | ORAL_CAPSULE | Freq: Every day | ORAL | Status: DC
Start: 2017-01-02 — End: 2017-01-03
  Administered 2017-01-03: 60 mg via ORAL
  Filled 2017-01-02: qty 2

## 2017-01-02 MED ORDER — ENOXAPARIN SODIUM 30 MG/0.3ML ~~LOC~~ SOLN
30.0000 mg | SUBCUTANEOUS | Status: DC
  Administered 2017-01-02 – 2017-01-03 (×2): 30 mg via SUBCUTANEOUS
  Filled 2017-01-02 (×2): qty 0.3

## 2017-01-02 MED ORDER — HYDROMORPHONE HCL 1 MG/ML IJ SOLN
1.0000 mg | Freq: Once | INTRAMUSCULAR | Status: DC
  Filled 2017-01-02: qty 1

## 2017-01-02 MED ORDER — SODIUM CHLORIDE 0.9 % IV BOLUS (SEPSIS)
1000.0000 mL | Freq: Once | INTRAVENOUS | Status: AC
  Administered 2017-01-02: 1000 mL via INTRAVENOUS

## 2017-01-02 MED ORDER — FREE WATER
150.0000 mL | Freq: Four times a day (QID) | Status: DC
  Administered 2017-01-02 – 2017-01-03 (×5): 150 mL

## 2017-01-02 MED ORDER — ONDANSETRON HCL 4 MG/2ML IJ SOLN
4.0000 mg | Freq: Four times a day (QID) | INTRAMUSCULAR | Status: DC | PRN
  Administered 2017-01-02: 4 mg via INTRAVENOUS
  Filled 2017-01-02: qty 2

## 2017-01-02 MED ORDER — OSMOLITE 1.5 CAL PO LIQD
237.0000 mL | Freq: Three times a day (TID) | ORAL | Status: DC
  Filled 2017-01-02 (×4): qty 1000

## 2017-01-02 MED ORDER — POTASSIUM CHLORIDE 10 MEQ/100ML IV SOLN
10.0000 meq | INTRAVENOUS | Status: AC
  Administered 2017-01-02 (×3): 10 meq via INTRAVENOUS
  Filled 2017-01-02: qty 100

## 2017-01-02 MED ORDER — HYDROMORPHONE HCL 1 MG/ML IJ SOLN
1.0000 mg | INTRAMUSCULAR | Status: DC | PRN
  Administered 2017-01-02 – 2017-01-03 (×5): 1 mg via INTRAVENOUS
  Filled 2017-01-02 (×5): qty 1

## 2017-01-02 MED ORDER — AMLODIPINE BESYLATE 5 MG PO TABS
5.0000 mg | ORAL_TABLET | Freq: Every day | ORAL | Status: DC
  Administered 2017-01-03: 5 mg via ORAL
  Filled 2017-01-02 (×2): qty 1

## 2017-01-02 NOTE — ED Notes (Signed)
ED Provider at bedside. 

## 2017-01-02 NOTE — ED Triage Notes (Signed)
Pt brought in by [facility] for c/o right side of head and neck pain with vomiting; pt has a hx of throat cancer and states she no longer receives chemo because nothing is working for the cancer;  Ems reports pt was coughing up green sputum

## 2017-01-02 NOTE — Consult Note (Signed)
Consultation Note Date: 01/02/2017   Patient Name: Molly Campbell  DOB: 30-Dec-1960  MRN: [id number]  Age / Sex: 56 y.o., female  PCP: Rosita Fire, MD Referring Physician: Rosita Fire, MD  Reason for Consultation: Establishing goals of care and Psychosocial/spiritual support  HPI/Patient Profile: 56 y.o. female  with past medical history of stage 4 head/neck cancer, hx of cervical cnacer.  admitted on 01/02/2017 with Acute kidney injury related to dehydration..   Clinical Assessment and Goals of Care: Molly Campbell is resting quietly in bed. She does not wake when I enter Molly room, and due to her frailty and fatigue, I do not wake her. There is no family at bedside today.   Call to sister, Molly Campbell. I what Molly Campbell has shared with Molly Campbell about her health. Molly Campbell states that hurt sister tells her that she is always in pain. That Molly Campbell feels like her throat is closing and she has not been able to eat or drink. Molly Campbell shares that Molly Campbell has not discussed prognosis with her, that Molly Campbell does not want to know that herself.  Call to daughter, Molly Campbell. She states that her mother came to Molly hospital last week, CT showed in large and tumor. Molly Campbell states that they understand there is no further treatment being offered. We talk about Molly Campbell being here for dehydration, but she has a feeding tube. Molly Campbell states, "she doesn't like it". She states that when Molly tube was 1st placed, Molly Campbell would flush it and administer feeding, after Mrs. Kaczmarczyk became used to it she administered her own feeding. Molly Campbell states that all of Molly Campbell were also removed due to chemotherapy/radiation, this is also affected her diet/nutrition. We talk about hospice services. Molly Campbell states that Molly Campbell had been with hospice of [facility] after she was told there was no  further radiation offered. She states that she understands her mother is to go with a different hospice services at this time. We talk about [facility]. I share that I feel this would be a great benefit to [name] during this time. We talk about HCPOA/advance directives. See below.  Call to Molly Campbell. He states that he is working with primary care office for a prescription for their hospice services. He states that he has spoken to Molly Campbell/her family last week.  Healthcare Power of Atty NEXT OF KIN - daughter Molly Campbell states she thinks she is POA, will look for paperwork. There are only two children, Molly Campbell and Molly Campbell. Son died in a motorcycle crash in 08/05/00. No husband.    SUMMARY OF RECOMMENDATIONS   Home with [facility], when stable.   Code Status/Advance Care Planning:  Full code - Molly Campbell is resting and I do not ask her about AD today. Daughter Molly Campbell states that her mother "wants to be resuscitated".  We talk about ATTEMPTED resuscitation. Molly Campbell states she wants to honor her mothers wishes. Her mother has stated her wishes and she "doesn't want to go  against what she wants". I share my worry about pain and suffering at end-of-life with CPR. I share that things look different now.  Symptom Management:   Per hospitalist, no additional needs at this time.   Palliative Prophylaxis:   Aspiration, Frequent Pain Assessment and Turn Reposition  Additional Recommendations (Limitations, Scope, Preferences):  At this point, continue with all treatment options, except chemotherapy/radiation.  Psycho-social/Spiritual:   Desire for further Chaplaincy support:yes  Additional Recommendations: Caregiving  Support/Resources and Education on Hospice  Prognosis:   < 3 months, would not be surprising based on stage IV head neck cancer, functional decline, weight loss to 83 pounds.  Discharge Planning: Home with  Molly benefits of [facility] hosp[facility] upon discharge.      Primary Diagnoses: Present on Admission: . AKI (acute kidney injury) ([address]) . 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 HTN (hypertension)   I have reviewed Molly medical record, interviewed Molly patient and family, and examined Molly patient. Molly following aspects are pertinent.  Past Medical History:  Diagnosis Date  . Alcohol abuse   . Alcoholic hepatitis 30/0/9233  . Anemia   . Anxiety disorder   . Asthma   . Auditory hallucinations   . Cancer (HCC)    cervical  . Chronic back pain   . Chronic bronchitis with acute exacerbation ([place])   . Cocaine abuse   . Constipation   . COPD (chronic obstructive pulmonary disease) ([place])   . Depression   . Dysrhythmia 2012   hx AF in er-cocaine-converted back NSR  . Emphysema   . GERD (gastroesophageal reflux disease)   . Headache   . History of cervical cancer 09/26/2013  . Hypertension   . Malignant neoplasm of tonsillar fossa ([place]) 11/12/2015  . Pneumonia    hx  . Poor circulation   . Schizophrenia ([place])   . Scoliosis   . Tobacco abuse    Social History   Social History  . Marital status: Widowed    Spouse name: N/A  . Number of children: 3  . Years of education: N/A   Social History Main Topics  . Smoking status: Current Every Day Smoker    Packs/day: 1.00    Years: 48.00    Types: Cigarettes  . Smokeless tobacco: Former Systems developer    Types: Snuff     Comment: One pack a day  . Alcohol use 0.0 oz/week     Comment: once a week  . Drug use: Yes    Types: Cocaine, Marijuana     Comment: last used 2 weeks ago  . Sexual activity: Not Currently    Birth control/ protection: Surgical   Other Topics Concern  . None   Social History Narrative  . None   Family History  Problem Relation Age of Onset  . Alzheimer's disease Mother   . Other Father        "he was beat to death"  . Heart disease Sister   . Gout Sister   . COPD Sister   . Seizures Sister   .  Diabetes Sister   . Diabetes Brother   . Cancer Paternal Grandmother        not sure what kind  . Seizures Sister   . Other Sister        "bones are messed up"  . Hypertension Brother   . Heart attack Brother   . Other Brother        MVA  . Diabetes Brother   . Other Brother  MVA  . Alcohol abuse Daughter   . Other Son        motorcycle accident  . Heart failure Other    Scheduled Meds: . amLODipine  5 mg Oral Daily  . DULoxetine  60 mg Oral Daily  . enoxaparin (LOVENOX) injection  30 mg Subcutaneous Q24H  . feeding supplement (PRO-STAT SUGAR FREE 64)  30 mL Per Tube Daily  . free water  150 mL Per Tube Q6H  .  HYDROmorphone (DILAUDID) injection  1 mg Intravenous Once   Continuous Infusions: . sodium chloride 100 mL/hr at 01/02/17 0852  . feeding supplement (OSMOLITE 1.5 CAL) 1,000 mL (01/02/17 1137)  . potassium chloride 10 mEq (01/02/17 1137)   PRN Meds:.HYDROmorphone (DILAUDID) injection, ondansetron **OR** ondansetron (ZOFRAN) IV Medications Prior to Admission:  Prior to Admission medications   Medication Sig Start Date End Date Taking? Authorizing Provider  albuterol (PROVENTIL HFA;VENTOLIN HFA) 108 (90 BASE) MCG/ACT inhaler Inhale 1-2 puffs into Molly lungs every 6 (six) hours as needed for wheezing or shortness of breath. 08/18/14  Yes Neese, Durenda Guthrie M, NP  HYDROcodone-acetaminophen (NORCO/VICODIN) 5-325 MG tablet Take 1 tablet by mouth every 4 (four) hours as needed for moderate pain or severe pain. 12/27/16  Yes Forde Dandy, MD  nitroGLYCERIN (NITROSTAT) 0.4 MG SL tablet Place 1 tablet (0.4 mg total) under Molly tongue every 5 (five) minutes as needed for chest pain. 07/06/16 12/03/16  Arnoldo Lenis, MD   Allergies  Allergen Reactions  . Benicar [Olmesartan] Palpitations and Other (See Comments)    TACHYCARDIA She was told to never take it again  . Ibuprofen Palpitations    TACHYCARDIA  . Latex Dermatitis    Latex bandages - little sores   . Morphine And  Related Nausea And Vomiting   Review of Systems  Unable to perform ROS   Physical Exam  Constitutional: No distress.  Appears very weak and frail, chronically ill.  HENT:  Temporal wasting  Cardiovascular: Normal rate and regular rhythm.   Pulmonary/Chest: Effort normal. No respiratory distress.  Abdominal: Soft.  PEG tube   Musculoskeletal: She exhibits no edema.  Skin: Skin is warm and dry.  Nursing note and vitals reviewed.   Vital Signs: BP (!) 143/83 (BP Location: Right Arm)   Pulse 64   Temp 98.3 F (36.8 C) (Oral)   Resp 18   Ht 5\' 2"  (1.575 m)   Wt 37.6 kg (83 lb)   SpO2 99%   BMI 15.18 kg/m  Pain Assessment: 0-10   Pain Score: Asleep   SpO2: SpO2: 99 % O2 Device:SpO2: 99 % O2 Flow Rate: .   IO: Intake/output summary:  Intake/Output Summary (Last 24 hours) at 01/02/17 1216 Last data filed at 01/02/17 1137  Gross per 24 hour  Intake          2572.67 ml  Output                0 ml  Net          2572.67 ml    LBM: Last BM Date:  (Pt unsure) Baseline Weight: Weight: 37.6 kg (83 lb) Most recent weight: Weight: 37.6 kg (83 lb)     Palliative Assessment/Data:   Flowsheet Rows     Most Recent Value  Intake Tab  Referral Department  Hospitalist  Unit at Time of Referral  Med/Surg Unit  Palliative Care Primary Diagnosis  Cancer  Date Notified  01/02/17  Palliative Care Type  New Palliative care  Reason for referral  Clarify Goals of Care, End of Life Care Assistance  Date of Admission  01/02/17  Date first seen by Palliative Care  01/02/17  # of days Palliative referral response time  0 Day(s)  # of days IP prior to Palliative referral  0  Clinical Assessment  Palliative Performance Scale Score  30%  Pain Max last 24 hours  Not able to report  Pain Min Last 24 hours  Not able to report  Dyspnea Max Last 24 Hours  Not able to report  Dyspnea Min Last 24 hours  Not able to report  Psychosocial & Spiritual Assessment  Palliative Care Outcomes    Patient/Family meeting held?  Yes  Who was at Molly meeting?  sister Molly Campbell, daughter Molly Campbell via phone       Time In: 1005 Time Out: 1040 Time Total: 35 minutes Greater than 50%  of this time was spent counseling and coordinating care related to Molly above assessment and plan.  Signed by: Drue Novel, NP   Please contact Palliative Medicine Team phone at [phone] for questions and concerns.  For individual provider: See Shea Evans

## 2017-01-02 NOTE — ED Notes (Signed)
Pt assisted to restroom.  

## 2017-01-02 NOTE — Progress Notes (Signed)
Initial Nutrition Assessment  DOCUMENTATION CODES:  Severe malnutrition in context of chronic illness, Underweight  INTERVENTION:  While inpatient, to increase likelihood of tolerance, change tf to continuous @ 40 ml/hr via PEG. + Prostat 30 ml q 24 hrs  Tube feeding regimen provides 1540 kcal (100 % of needs), 75 grams of protein, and 732  ml of H2O. Flush with 150 ml q 6 hrs for additional 600 cc free water  Patient has been incapable of managing her tube feeding on own. If at all possible, would recommend being discharged with support that can manage this for her. 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 Presented with dysphagia and neck pain. Unable to drink/eat due to pain. Admitted for management  Patient very well known to RD as she had been a patient at the [facility]. Patient was ultimately unable to utilize her PEG tube despite numerous attempts at re-education. Inability thought related to cognitive deficits/ongoing substance abuse. Her inability to maintain her nutritional status was one of factors that led to her not being candidate for further treatment. She ultimately was discharged to hospice.   She had been receiving Osmolite 1.5 for majority of treatment.  The last couple times she was seen, she reported new intolerance in form of diarrhea. Despite having doubts that the TF was to blame, she was changed to Jevity, but ultimately reported never receiving this formula.  She has had anticipated decline since last seen. She has lost 12% of her BW (11.5 lbs) since she was last seen 7 months ago. This is clinically significant for the time frame. Still not using PEG tube. She had been able to eat up until just recently.  Will order Osmolite 1.5 as this will likely be better tolerated. Her past reports of intolerance was an acute happening. She had been stable on this the majority of treatment.   NFPE: Severe muscle/fat wasting  Labs: WBC:10.8, BUN/Creat:37/2.93, K:3.1 Meds: KCL, IVF, Pain medications   Recent Labs Lab 12/27/16 1142 01/02/17 0323  NA 139 143  K 3.4* 3.1*  CL 104 107  CO2 26 23  BUN 30* 37*  CREATININE 1.97* 2.93*  CALCIUM 9.6 9.4  GLUCOSE 97 107*   Diet Order:  Diet NPO time specified  Skin:  Reviewed, no issues  Last BM:  Unknown  Height:  Ht Readings from Last 1 Encounters:  01/02/17 5\' 2"  (1.575 m)   Weight:  Wt Readings from Last 1 Encounters:  01/02/17 83 lb (37.6 kg)   Wt Readings from Last 10 Encounters:  01/02/17 83 lb (37.6 kg)  12/28/16 83 lb (37.6 kg)  12/27/16 88 lb 3.2 oz (40 kg)  12/22/16 88 lb (39.9 kg)  12/03/16 100 lb (45.4 kg)  10/18/16 92 lb (41.7 kg)  07/06/16 92 lb (41.7 kg)  06/07/16 94 lb 8 oz (42.9 kg)  05/23/16 96 lb (43.5 kg)  04/25/16 98 lb 3.2 oz (44.5 kg)   Ideal Body Weight:  50 kg  BMI:  Body mass index is 15.18 kg/m.  Estimated Nutritional Needs:  Kcal:  1300-1500 kcals (35-40 kcal/kg bw) Protein:  65-75g Pro (1.7-2g/kg bw) Fluid:  >1.1 L fluid (30 ml/kg bw)  EDUCATION NEEDS:  No education needs identified at this time  Burtis Junes RD, LDN, [place] [facility] Pager: [phone] 01/02/2017 10:49 AM

## 2017-01-02 NOTE — ED Notes (Signed)
Pt ambulatory to bathroom and back to room 

## 2017-01-02 NOTE — ED Provider Notes (Signed)
[facility] DEPT Provider Note   CSN: [id number] Arrival date & time: 01/02/17  0210     History   Chief Complaint Chief Complaint  Patient presents with  . Headache    HPI Molly Campbell is a 56 y.o. female.  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 Patient reports decreased oral intake.she was seen and evaluated several days ago on 8/22 for the same. At that time she was encouraged to use her feeding tube. She states that she has not been using her feeding tube. She reports worsening right-sided head and neck pain. Denies any focal deficits.  She denies shortness of breath or chest pain. Current pain is 10 out of 10. She was given a short course of pain medication at discharge but no longer has any.  Of note, patient had previously been on hospice. She was referred to defer hospice service at discharge with the assistance of case management. She reports that she just received the paperwork yesterday.  Past Medical History:  Diagnosis Date  . Alcohol abuse   . Alcoholic hepatitis 42/07/5359  . Anemia   . Anxiety disorder   . Asthma   . Auditory hallucinations   . Cancer (HCC)    cervical  . Chronic back pain   . Chronic bronchitis with acute exacerbation ([place])   . Cocaine abuse   . Constipation   . COPD (chronic obstructive pulmonary disease) ([place])   . Depression   . Dysrhythmia 2012   hx AF in er-cocaine-converted back NSR  . Emphysema   . GERD (gastroesophageal reflux disease)   . Headache   . History of cervical cancer 09/26/2013  . Hypertension   . Malignant neoplasm of tonsillar fossa ([place]) 11/12/2015  . Pneumonia    hx  . Poor circulation   . Schizophrenia ([place])   . Scoliosis   . Tobacco abuse     Patient Active Problem List   Diagnosis Date Noted  . Pulmonary emphysema ([place]) 12/15/2015  . Gastrostomy tube in place ([place]) 12/15/2015  . Alcohol  use 12/15/2015  . Tobacco use 12/15/2015  . 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 Chronic periodontitis 12/13/2015  . Tonsillar cancer ([place])   . Gastric mass 11/22/2015  . Constipation 11/22/2015  . Malignant neoplasm of tonsillar fossa ([place]) 11/12/2015  . 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 Melena 12/02/2013  . Rectal pain 12/02/2013  . Abdominal pain, epigastric 12/02/2013  . GERD (gastroesophageal reflux disease) 12/02/2013  . Esophageal dysphagia 12/02/2013  . History of cervical cancer 09/26/2013  . Mental retardation 01/07/2013  . Encephalopathy, metabolic 44/31/5400  . Chest pain 01/06/2013  . Atrial fibrillation ([place]) 01/05/2013  . Alcoholic hepatitis 86/76/1952  . HTN (hypertension) 03/14/2011  . Chest pain 03/14/2011  . Alcohol intoxication ([place]) 03/14/2011  . Tobacco abuse   . Alcohol abuse   . Drug abuse, cocaine type     Past Surgical History:  Procedure Laterality Date  . ABDOMINAL HYSTERECTOMY    . BREAST BIOPSY  2005   lt bx  . COLONOSCOPY  05/07/2002   Dr.Demason- large approximately 1.5cm polyp pedunculated aproximately 10cm from the anal verge bx= adenomatous polyp  . DIRECT LARYNGOSCOPY N/A 10/25/2015   Procedure: DIRECT LARYNGOSCOPY WITH BIOPSY OF ORAL PHARYNGEAL LESION;  Surgeon: Leta Baptist, MD;  Location: [facility];  Service: ENT;  Laterality: N/A;  DIRECT LARYNGOSCOPY WITH BIOPSY OF ORAL PHARYNGEAL LESION  . ESOPHAGOGASTRODUODENOSCOPY  06/02/2002   Dr. Anthony Sar- mild distal esophagitis w/o stricturing or ulceration. stomach and pylorus are normal. inflammatory changes of proximal duldenum in the first portion.   . ESOPHAGOGASTRODUODENOSCOPY (EGD) WITH PROPOFOL N/A 11/23/2015   Procedure: ESOPHAGOGASTRODUODENOSCOPY (EGD) WITH PROPOFOL;  Surgeon: Danie Binder, MD;  [facility];  Service: Endoscopy;  Laterality: N/A;  1245  . FRACTURE SURGERY  right wrist   [place]  .  MULTIPLE EXTRACTIONS WITH ALVEOLOPLASTY N/A 12/13/2015   Procedure: Extraction of tooth #'s 2,3,4,5,6,12,13,14,15,17,and 18 with alveoloplasty, bilateral maxillary tuberosity reductions, and gross debridement of remaining teeth;  Surgeon: Lenn Cal, DDS;  Location: [facility];  Service: Oral Surgery;  Laterality: N/A;  . PEG PLACEMENT  11/18/15  . PORTACATH PLACEMENT Right 11/18/15  . TONSILLECTOMY Left 04/25/2016   Procedure: LEFT SIDE TONSILLECTOMY;  Surgeon: Leta Baptist, MD;  Location: [facility];  Service: ENT;  Laterality: Left;  . TONSILLECTOMY      OB History    Gravida Para Term Preterm AB Living   3 3 3     3    SAB TAB Ectopic Multiple Live Births                   Home Medications    Prior to Admission medications   Medication Sig Start Date End Date Taking? Authorizing Provider  albuterol (PROVENTIL HFA;VENTOLIN HFA) 108 (90 BASE) MCG/ACT inhaler Inhale 1-2 puffs into the lungs every 6 (six) hours as needed for wheezing or shortness of breath. 08/18/14   Ashley Murrain, NP  amLODipine (NORVASC) 5 MG tablet Take 5 mg by mouth daily.    [provider]  DULoxetine (CYMBALTA) 30 MG capsule TAKE 1 CAPSULE BY MOUTH DAILY FOR 5 DAYS; THEN TAKE 2 CAPSULES DAILY. 08/30/16   Baird Cancer, PA-C  HYDROcodone-acetaminophen (NORCO) 5-325 MG tablet Take 1-2 tablets by mouth every 4 (four) hours as needed for moderate pain or severe pain. 06/20/16   Baird Cancer, PA-C  HYDROcodone-acetaminophen (NORCO/VICODIN) 5-325 MG tablet Take 1 tablet by mouth every 4 (four) hours as needed for moderate pain or severe pain. 12/27/16   Forde Dandy, MD  lidocaine (XYLOCAINE) 2 % solution Mix 1 part 2%viscous lidocaine,1part H2O.Swish and swallow 51mL of this mixture,81min before meals and at bedtime, up to QID Patient not taking: Reported on 12/03/2016 11/12/15   Eppie Gibson, MD  lidocaine (XYLOCAINE) 2 % solution Use as directed 10 mLs in the mouth or throat every 4 (four) hours  as needed for mouth pain. Patient not taking: Reported on 12/27/2016 10/18/16   Virgel Manifold, MD  lidocaine-prilocaine (EMLA) cream Apply to affected area once Patient not taking: Reported on 12/27/2016 12/14/15   Baird Cancer, PA-C  nitroGLYCERIN (NITROSTAT) 0.4 MG SL tablet Place 1 tablet (0.4 mg total) under the tongue every 5 (five) minutes as needed for chest pain. 07/06/16 12/03/16  Arnoldo Lenis, MD  Nutritional Supplements (FEEDING SUPPLEMENT, OSMOLITE 1.5 CAL,) LIQD 5 cans via PEG, split into 3 meals of 1.5, 1.5 and 2 cans. Flush with 150 cc water before and after each feed 11/19/15   Penland, Kelby Fam, MD  oxyCODONE (ROXICODONE) 5 MG/5ML solution Take 5 mLs (5 mg total) by mouth every 4 (four) hours as needed for severe pain. Patient not taking: Reported on 10/18/2016 06/16/16   Baird Cancer, PA-C  oxyCODONE-acetaminophen (PERCOCET/ROXICET) 5-325 MG tablet Take 1-2  tablets by mouth every 6 (six) hours as needed. 10/18/16   Virgel Manifold, MD  polyethylene glycol powder [facility]) powder Take one capsule and mix in 4-6 ounces of water, take one to two times daily as needed for constipation. Patient not taking: Reported on 10/18/2016 11/22/15   Mahala Menghini, PA-C    Family History Family History  Problem Relation Age of Onset  . Alzheimer's disease Mother   . Other Father        "he was beat to death"  . Heart disease Sister   . Gout Sister   . COPD Sister   . Seizures Sister   . Diabetes Sister   . Diabetes Brother   . Cancer Paternal Grandmother        not sure what kind  . Seizures Sister   . Other Sister        "bones are messed up"  . Hypertension Brother   . Heart attack Brother   . Other Brother        MVA  . Diabetes Brother   . Other Brother        MVA  . Alcohol abuse Daughter   . Other Son        motorcycle accident  . Heart failure Other     Social History Social History  Substance Use Topics  . Smoking status: Current Every Day Smoker     Packs/day: 1.00    Years: 48.00    Types: Cigarettes  . Smokeless tobacco: Former Systems developer    Types: Snuff     Comment: One pack a day  . Alcohol use 0.0 oz/week     Comment: once a week     Allergies   Benicar [olmesartan]; Ibuprofen; Latex; and Morphine and related   Review of Systems Review of Systems  Constitutional: Negative for fever.  HENT: Positive for trouble swallowing.   Respiratory: Negative for shortness of breath.   Cardiovascular: Negative for chest pain.  Gastrointestinal: Negative for abdominal pain and vomiting.  Musculoskeletal: Positive for neck pain.  Neurological: Positive for headaches.  All other systems reviewed and are negative.    Physical Exam Updated Vital Signs BP 107/82 (BP Location: Right Arm)   Pulse (!) 107   Temp 98.3 F (36.8 C) (Oral)   Resp 20   Ht 5\' 2"  (1.575 m)   Wt 37.6 kg (83 lb)   SpO2 99%   BMI 15.18 kg/m   Physical Exam  Constitutional: She is oriented to person, place, and time.  Chronically ill-appearing, thin, cachectic  HENT:  Head: Normocephalic and atraumatic.  Increased oral secretions, swelling and mass noted mostly over the right posterior oropharynx, ABC's intact at this time Swelling noted right neck  Eyes: Pupils are equal, round, and reactive to light.  Cardiovascular: Regular rhythm and normal heart sounds.   No murmur heard. tachycardia  Pulmonary/Chest: Effort normal and breath sounds normal. No respiratory distress. She has no wheezes.  Abdominal: Soft. Bowel sounds are normal. There is no tenderness. There is no guarding.  G-tube in place  Neurological: She is alert and oriented to person, place, and time.  Skin: Skin is warm and dry.  Psychiatric: She has a normal mood and affect.  Nursing note and vitals reviewed.    ED Treatments / Results  Labs (all labs ordered are listed, but only abnormal results are displayed) Labs Reviewed  CBC WITH DIFFERENTIAL/PLATELET - Abnormal; Notable for  the following:  Result Value   WBC 10.8 (*)    RBC 3.74 (*)    Platelets 713 (*)    Neutro Abs 8.2 (*)    All other components within normal limits  BASIC METABOLIC PANEL - Abnormal; Notable for the following:    Potassium 3.1 (*)    Glucose, Bld 107 (*)    BUN 37 (*)    Creatinine, Ser 2.93 (*)    GFR calc non Af Amer 17 (*)    GFR calc Af Amer 19 (*)    All other components within normal limits  URINALYSIS, ROUTINE W REFLEX MICROSCOPIC - Abnormal; Notable for the following:    APPearance HAZY (*)    Ketones, ur 5 (*)    Protein, ur 30 (*)    Leukocytes, UA MODERATE (*)    Bacteria, UA RARE (*)    Squamous Epithelial / LPF 6-30 (*)    All other components within normal limits    EKG  EKG Interpretation None       Radiology No results found.  Procedures Procedures (including critical care time)  Medications Ordered in ED Medications  sodium chloride 0.9 % bolus 1,000 mL (not administered)  HYDROmorphone (DILAUDID) injection 1 mg (not administered)  sodium chloride 0.9 % bolus 1,000 mL (1,000 mLs Intravenous New Bag/Given 01/02/17 0346)  HYDROmorphone (DILAUDID) injection 1 mg (1 mg Intravenous Given 01/02/17 0346)  ondansetron (ZOFRAN) injection 4 mg (4 mg Intravenous Given 01/02/17 0346)     Initial Impression / Assessment and Plan / ED Course  I have reviewed the triage vital signs and the nursing notes.  Pertinent labs & imaging results that were available during my care of the patient were reviewed by me and considered in my medical decision making (see chart for details).     Patient presents with worsening dysphasia and pain.  Has had difficulty establishing with hospice. She appears dehydrated. ABCs are intact at this time but increased salivation and mass noted on oropharyngeal exam. She is not using her G-tube.  Creatinine is 2.98. Baseline at the end of July was 1.0. Likely related to dehydration. Patient had a CT scan of the head and neck on 8/22  which did show enlarging tumor burden. She is not amenable to any further treatment. It is unclear whether she would need or want a tracheostomy but at this time her airway appears intact. Given ongoing pain and obvious signs of dehydration and acute kidney injury, will admit for fluids and pain control. Would benefit from palliative consult while in the hospital.  Final Clinical Impressions(s) / ED Diagnoses   Final diagnoses:  Acute kidney injury ([place])  Dehydration  Inadequate pain control    New Prescriptions New Prescriptions   No medications on file     Merryl Hacker, MD 01/02/17 [phone]

## 2017-01-02 NOTE — H&P (Signed)
TRH H&P    Patient Demographics:    Molly Campbell, is a 56 y.o. female  MRN: [id number]  DOB - 1960-07-12  Admit Date - 01/02/2017  Referring MD/NP/PA: Dr. Dina Rich  Outpatient Primary MD for the patient is Rosita Fire, MD  Patient coming from: Home  Chief Complaint  Patient presents with  . Headache      HPI:    Molly Campbell  is a 56 y.o. 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 Patient has not been able to eat or drink due to pain. She has a feeding tube and was encouraged to use that. Patient is not using the feeding tube. She denies chest pain or shortness of breath. No abdominal pain. In the ED she was given Dilaudid for neck pain. Patient says that she received propofol for hospice yesterday.  She denies nausea vomiting or diarrhea.    Review of systems:     All other systems reviewed and are negative.   With Past History of the following :    Past Medical History:  Diagnosis Date  . Alcohol abuse   . Alcoholic hepatitis 26/11/1243  . Anemia   . Anxiety disorder   . Asthma   . Auditory hallucinations   . Cancer (HCC)    cervical  . Chronic back pain   . Chronic bronchitis with acute exacerbation ([place])   . Cocaine abuse   . Constipation   . COPD (chronic obstructive pulmonary disease) ([place])   . Depression   . Dysrhythmia 2012   hx AF in er-cocaine-converted back NSR  . Emphysema   . GERD (gastroesophageal reflux disease)   . Headache   . History of cervical cancer 09/26/2013  . Hypertension   . Malignant neoplasm of tonsillar fossa ([place]) 11/12/2015  . Pneumonia    hx  . Poor circulation   . Schizophrenia ([place])   . Scoliosis   . Tobacco abuse       Past Surgical History:  Procedure Laterality Date  . ABDOMINAL HYSTERECTOMY    . BREAST BIOPSY  2005   lt bx  .  COLONOSCOPY  05/07/2002   Dr.Demason- large approximately 1.5cm polyp pedunculated aproximately 10cm from the anal verge bx= adenomatous polyp  . DIRECT LARYNGOSCOPY N/A 10/25/2015   Procedure: DIRECT LARYNGOSCOPY WITH BIOPSY OF ORAL PHARYNGEAL LESION;  Surgeon: Leta Baptist, MD;  Location: [facility];  Service: ENT;  Laterality: N/A;  DIRECT LARYNGOSCOPY WITH BIOPSY OF ORAL PHARYNGEAL LESION  . ESOPHAGOGASTRODUODENOSCOPY  06/02/2002   Dr. Anthony Sar- mild distal esophagitis w/o stricturing or ulceration. stomach and pylorus are normal. inflammatory changes of proximal duldenum in the first portion.   . ESOPHAGOGASTRODUODENOSCOPY (EGD) WITH PROPOFOL N/A 11/23/2015   Procedure: ESOPHAGOGASTRODUODENOSCOPY (EGD) WITH PROPOFOL;  Surgeon: Danie Binder, MD;  [facility];  Service: Endoscopy;  Laterality: N/A;  1245  . FRACTURE SURGERY  right wrist   [place]  . MULTIPLE EXTRACTIONS WITH ALVEOLOPLASTY  N/A 12/13/2015   Procedure: Extraction of tooth #'s 2,3,4,5,6,12,13,14,15,17,and 18 with alveoloplasty, bilateral maxillary tuberosity reductions, and gross debridement of remaining teeth;  Surgeon: Lenn Cal, DDS;  Location: [facility];  Service: Oral Surgery;  Laterality: N/A;  . PEG PLACEMENT  11/18/15  . PORTACATH PLACEMENT Right 11/18/15  . TONSILLECTOMY Left 04/25/2016   Procedure: LEFT SIDE TONSILLECTOMY;  Surgeon: Leta Baptist, MD;  Location: [facility];  Service: ENT;  Laterality: Left;  . TONSILLECTOMY        Social History:      Social History  Substance Use Topics  . Smoking status: Current Every Day Smoker    Packs/day: 1.00    Years: 48.00    Types: Cigarettes  . Smokeless tobacco: Former Systems developer    Types: Snuff     Comment: One pack a day  . Alcohol use 0.0 oz/week     Comment: once a week       Family History :     Family History  Problem Relation Age of Onset  . Alzheimer's disease Mother   . Other Father        "he was beat to death"  .  Heart disease Sister   . Gout Sister   . COPD Sister   . Seizures Sister   . Diabetes Sister   . Diabetes Brother   . Cancer Paternal Grandmother        not sure what kind  . Seizures Sister   . Other Sister        "bones are messed up"  . Hypertension Brother   . Heart attack Brother   . Other Brother        MVA  . Diabetes Brother   . Other Brother        MVA  . Alcohol abuse Daughter   . Other Son        motorcycle accident  . Heart failure Other       Home Medications:   Prior to Admission medications   Medication Sig Start Date End Date Taking? Authorizing Provider  albuterol (PROVENTIL HFA;VENTOLIN HFA) 108 (90 BASE) MCG/ACT inhaler Inhale 1-2 puffs into the lungs every 6 (six) hours as needed for wheezing or shortness of breath. 08/18/14   Ashley Murrain, NP  amLODipine (NORVASC) 5 MG tablet Take 5 mg by mouth daily.    [provider]  DULoxetine (CYMBALTA) 30 MG capsule TAKE 1 CAPSULE BY MOUTH DAILY FOR 5 DAYS; THEN TAKE 2 CAPSULES DAILY. 08/30/16   Baird Cancer, PA-C  HYDROcodone-acetaminophen (NORCO) 5-325 MG tablet Take 1-2 tablets by mouth every 4 (four) hours as needed for moderate pain or severe pain. 06/20/16   Baird Cancer, PA-C  HYDROcodone-acetaminophen (NORCO/VICODIN) 5-325 MG tablet Take 1 tablet by mouth every 4 (four) hours as needed for moderate pain or severe pain. 12/27/16   Forde Dandy, MD  lidocaine (XYLOCAINE) 2 % solution Mix 1 part 2%viscous lidocaine,1part H2O.Swish and swallow 51mL of this mixture,68min before meals and at bedtime, up to QID Patient not taking: Reported on 12/03/2016 11/12/15   Eppie Gibson, MD  lidocaine (XYLOCAINE) 2 % solution Use as directed 10 mLs in the mouth or throat every 4 (four) hours as needed for mouth pain. Patient not taking: Reported on 12/27/2016 10/18/16   Virgel Manifold, MD  lidocaine-prilocaine (EMLA) cream Apply to affected area once Patient not taking: Reported on 12/27/2016 12/14/15   Baird Cancer, PA-C  nitroGLYCERIN (  NITROSTAT) 0.4 MG SL tablet Place 1 tablet (0.4 mg total) under the tongue every 5 (five) minutes as needed for chest pain. 07/06/16 12/03/16  Arnoldo Lenis, MD  Nutritional Supplements (FEEDING SUPPLEMENT, OSMOLITE 1.5 CAL,) LIQD 5 cans via PEG, split into 3 meals of 1.5, 1.5 and 2 cans. Flush with 150 cc water before and after each feed 11/19/15   Penland, Kelby Fam, MD  oxyCODONE (ROXICODONE) 5 MG/5ML solution Take 5 mLs (5 mg total) by mouth every 4 (four) hours as needed for severe pain. Patient not taking: Reported on 10/18/2016 06/16/16   Baird Cancer, PA-C  oxyCODONE-acetaminophen (PERCOCET/ROXICET) 5-325 MG tablet Take 1-2 tablets by mouth every 6 (six) hours as needed. 10/18/16   Virgel Manifold, MD  polyethylene glycol powder [facility]) powder Take one capsule and mix in 4-6 ounces of water, take one to two times daily as needed for constipation. Patient not taking: Reported on 10/18/2016 11/22/15   Mahala Menghini, PA-C     Allergies:     Allergies  Allergen Reactions  . Benicar [Olmesartan] Palpitations and Other (See Comments)    TACHYCARDIA She was told to never take it again  . Ibuprofen Palpitations    TACHYCARDIA  . Latex Dermatitis    Latex bandages - little sores   . Morphine And Related Nausea And Vomiting     Physical Exam:   Vitals  Blood pressure 133/84, pulse 67, temperature 98.3 F (36.8 C), temperature source Oral, resp. rate 18, height 5\' 2"  (1.575 m), weight 37.6 kg (83 lb), SpO2 100 %.  1.  General: Cachectic appearing female  2. Psychiatric:  Intact judgement and  insight, awake alert, oriented x 3.  3. Neurologic: No focal neurological deficits, all cranial nerves intact.Strength 5/5 all 4 extremities, sensation intact all 4 extremities, plantars down going.  4. Eyes :  anicteric sclerae, moist conjunctivae with no lid lag. PERRLA.  5. ENMT:  Increased oral secretions  6. Neck:  Swelling noted  on  the right side of neck  7. Respiratory : Normal respiratory effort, good air movement bilaterally,clear to  auscultation bilaterally  8. Cardiovascular : RRR, no gallops, rubs or murmurs, no leg edema  9. Gastrointestinal:  Positive bowel sounds, abdomen soft, non-tender to palpation,no hepatosplenomegaly, no rigidity or guarding       10. Skin:  No cyanosis, normal texture and turgor, no rash, lesions or ulcers  11.Musculoskeletal:  Good muscle tone,  joints appear normal , no effusions,  normal range of motion    Data Review:    CBC  Recent Labs Lab 12/27/16 1142 01/02/17 0323  WBC 7.6 10.8*  HGB 12.2 12.3  HCT 36.1 36.1  PLT 597* 713*  MCV 97.0 96.5  MCH 32.8 32.9  MCHC 33.8 34.1  RDW 12.9 12.8  LYMPHSABS 2.3 1.7  MONOABS 0.6 0.7  EOSABS 0.1 0.2  BASOSABS 0.0 0.0   ------------------------------------------------------------------------------------------------------------------  Chemistries   Recent Labs Lab 12/27/16 1142 01/02/17 0323  NA 139 143  K 3.4* 3.1*  CL 104 107  CO2 26 23  GLUCOSE 97 107*  BUN 30* 37*  CREATININE 1.97* 2.93*  CALCIUM 9.6 9.4   ------------------------------------------------------------------------------------------------------------------  ------------------------------------------------------------------------------------------------------------------ GFR: Estimated Creatinine Clearance: 12.4 mL/min (A) (by C-G formula based on SCr of 2.93 mg/dL (H)). Liver Function Tests: No results for input(s): AST, ALT, ALKPHOS, BILITOT, PROT, ALBUMIN in the last 168 hours. No results for input(s): LIPASE, AMYLASE in the last 168 hours. No results for input(s): AMMONIA in  the last 168 hours. Coagulation Profile: No results for input(s): INR, PROTIME in the last 168 hours. Cardiac Enzymes: No results for input(s): CKTOTAL, CKMB, CKMBINDEX, TROPONINI in the last 168 hours. BNP (last 3 results) No results for input(s): PROBNP in  the last 8760 hours. HbA1C: No results for input(s): HGBA1C in the last 72 hours. CBG: No results for input(s): GLUCAP in the last 168 hours. Lipid Profile: No results for input(s): CHOL, HDL, LDLCALC, TRIG, CHOLHDL, LDLDIRECT in the last 72 hours. Thyroid Function Tests: No results for input(s): TSH, T4TOTAL, FREET4, T3FREE, THYROIDAB in the last 72 hours. Anemia Panel: No results for input(s): VITAMINB12, FOLATE, FERRITIN, TIBC, IRON, RETICCTPCT in the last 72 hours.  --------------------------------------------------------------------------------------------------------------- Urine analysis:    Component Value Date/Time   COLORURINE YELLOW 01/02/2017 0240   APPEARANCEUR HAZY (A) 01/02/2017 0240   LABSPEC 1.021 01/02/2017 0240   PHURINE 5.0 01/02/2017 0240   GLUCOSEU NEGATIVE 01/02/2017 0240   HGBUR NEGATIVE 01/02/2017 0240   BILIRUBINUR NEGATIVE 01/02/2017 0240   KETONESUR 5 (A) 01/02/2017 0240   PROTEINUR 30 (A) 01/02/2017 0240   UROBILINOGEN 0.2 08/02/2013 1506   NITRITE NEGATIVE 01/02/2017 0240   LEUKOCYTESUR MODERATE (A) 01/02/2017 0240      Imaging Results:      Assessment & Plan:    Active Problems:   HTN (hypertension)   Mental retardation   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 Acute kidney injury- BUN/creatinine is 37/2.93, previous creatinine on 12/27/2016 was 1.97 ,secondary to poor by mouth intake due to dysphagia, will start IV normal saline at 100 ML per hour. Follow BMP in a.m. 2. Hypokalemia- potassium is 3.1, will replace potassium with IV KCl 10 meq 3. 3. Neck pain- secondary to terminal stage IV squamous cell head and neck cancer, will start Dilaudid 1 mg IV every 4 hours when necessary for pain 4. Dysphagia-patient has difficulty swallowing, secondary to her neck cancer. She has a PEG tube in place. We'll consult nutrition for assessment of nutrition status and  further recommendations. In the meantime we'll start Osmolite 1.5-37 mL 3 times a day. Flush with 1 50 mL water before and after each feed. 5. Hypertension-continue amlodipine 6. Stage IV squamous cell carcinoma of head and neck- patient was supposed to be enrolled in hospice, received paperwork yesterday as per patient. Will consult palliative care for further assistance.   DVT Prophylaxis-   Lovenox   AM Labs Ordered, also please review Full Orders  Family Communication: Admission, patients condition and plan of care including tests being ordered have been discussed with the patient  who indicate understanding and agree with the plan and Code Status.  Code Status: Full code  Admission status: Inpatient    Time spent in minutes : 50 minutes   LAMA,GAGAN S M.D on 01/02/2017 at 6:46 AM  Between 7am to 7pm - Pager - [phone]. After 7pm go to [email or web address] - password [facility]  [facility] - Office  [phone]

## 2017-01-03 DIAGNOSIS — Z7189 Other specified counseling: Secondary | ICD-10-CM

## 2017-01-03 LAB — CBC
HCT: 31.2 % — ABNORMAL LOW (ref 36.0–46.0)
Hemoglobin: 10.4 g/dL — ABNORMAL LOW (ref 12.0–15.0)
MCH: 32.9 pg (ref 26.0–34.0)
MCHC: 33.3 g/dL (ref 30.0–36.0)
MCV: 98.7 fL (ref 78.0–100.0)
PLATELETS: 593 10*3/uL — AB (ref 150–400)
RBC: 3.16 MIL/uL — ABNORMAL LOW (ref 3.87–5.11)
RDW: 13.1 % (ref 11.5–15.5)
WBC: 12.3 10*3/uL — AB (ref 4.0–10.5)

## 2017-01-03 LAB — COMPREHENSIVE METABOLIC PANEL
ALBUMIN: 2.9 g/dL — AB (ref 3.5–5.0)
ALT: 9 U/L — ABNORMAL LOW (ref 14–54)
ANION GAP: 8 (ref 5–15)
AST: 16 U/L (ref 15–41)
Alkaline Phosphatase: 79 U/L (ref 38–126)
BUN: 26 mg/dL — AB (ref 6–20)
CHLORIDE: 114 mmol/L — AB (ref 101–111)
CO2: 23 mmol/L (ref 22–32)
Calcium: 8.7 mg/dL — ABNORMAL LOW (ref 8.9–10.3)
Creatinine, Ser: 1.57 mg/dL — ABNORMAL HIGH (ref 0.44–1.00)
GFR calc Af Amer: 41 mL/min — ABNORMAL LOW (ref >60)
GFR, EST NON AFRICAN AMERICAN: 35 mL/min — AB (ref >60)
Glucose, Bld: 107 mg/dL — ABNORMAL HIGH (ref 65–99)
POTASSIUM: 3.7 mmol/L (ref 3.5–5.1)
Sodium: 145 mmol/L (ref 135–145)
TOTAL PROTEIN: 6.2 g/dL — AB (ref 6.5–8.1)
Total Bilirubin: 0.1 mg/dL — ABNORMAL LOW (ref 0.3–1.2)

## 2017-01-03 LAB — HIV ANTIBODY (ROUTINE TESTING W REFLEX): HIV SCREEN 4TH GENERATION: NONREACTIVE

## 2017-01-03 MED ORDER — HYDROMORPHONE HCL 1 MG/ML IJ SOLN
1.0000 mg | INTRAMUSCULAR | Status: DC | PRN
  Administered 2017-01-03: 1 mg via INTRAVENOUS
  Filled 2017-01-03: qty 1

## 2017-01-03 NOTE — Plan of Care (Signed)
Call from nursing stating patient is wanting to leave.  Face to face with patient who is alert and oriented X 3.  She tells [place] she is feeling better and wants to leave.  Molly Campbell states she has a ride and does not need, or want, [place] to call for her.  She is adamant that she will leave. She is reassured that we will not stop her, but that we care for her and her safety. Molly Campbell states she is ok.   Call to attending, Dr. Legrand Rams.  Case discussed with Tonia Ghent, office nurse, who is very familiar with patient. I share that Molly Campbell wants to leave and I do not believe she is a danger to self or others.  I share that  [facility] has accepted patient and will see her outpatient in the next few days. Tonia Ghent states understanding and agreement that patient will leave AMA.  No charge

## 2017-01-03 NOTE — Progress Notes (Signed)
Subjective: 56 years old female with stage IV ca of the head and neck was admitted yesterday due to AKI. Patient has PEG tube for feeding. She complaining of pain on her neck. Patient is a full code. I tried to discuss about advanced directive but wants to resuscitated. .  Objective: Vital signs in last 24 hours: Temp:  [97.9 F (36.6 C)-98.5 F (36.9 C)] 98.4 F (36.9 C) (08/29 0447) Pulse Rate:  [76-93] 76 (08/29 0447) Resp:  [18-22] 20 (08/29 0447) BP: (126-140)/(74-85) 126/74 (08/29 0447) SpO2:  [97 %-100 %] 99 % (08/29 0447) Weight change: 0 kg (0 lb) Last BM Date:  (Unknown)  Intake/Output from previous day: 08/28 0701 - 08/29 0700 In: 4301.3 [I.V.:1813.3; NG/GT:938; IV Piggyback:1300] Out: -   PHYSICAL EXAM General appearance: cachectic and fatigued Resp: diminished breath sounds bilaterally and rhonchi bilaterally Cardio: S1, S2 normal GI: soft, non-tender; bowel sounds normal; no masses,  no organomegaly Extremities: extremities normal, atraumatic, no cyanosis or edema  Lab Results:  Results for orders placed or performed during the hospital encounter of 01/02/17 (from the past 48 hour(s))  Urinalysis, Routine w reflex microscopic     Status: Abnormal   Collection Time: 01/02/17  2:40 AM  Result Value Ref Range   Color, Urine YELLOW YELLOW   APPearance HAZY (A) CLEAR   Specific Gravity, Urine 1.021 1.005 - 1.030   pH 5.0 5.0 - 8.0   Glucose, UA NEGATIVE NEGATIVE mg/dL   Hgb urine dipstick NEGATIVE NEGATIVE   Bilirubin Urine NEGATIVE NEGATIVE   Ketones, ur 5 (A) NEGATIVE mg/dL   Protein, ur 30 (A) NEGATIVE mg/dL   Nitrite NEGATIVE NEGATIVE   Leukocytes, UA MODERATE (A) NEGATIVE   RBC / HPF 0-5 0 - 5 RBC/hpf   WBC, UA 0-5 0 - 5 WBC/hpf   Bacteria, UA RARE (A) NONE SEEN   Squamous Epithelial / LPF 6-30 (A) NONE SEEN   Mucus PRESENT   CBC with Differential     Status: Abnormal   Collection Time: 01/02/17  3:23 AM  Result Value Ref Range   WBC 10.8 (H) 4.0 -  10.5 K/uL   RBC 3.74 (L) 3.87 - 5.11 MIL/uL   Hemoglobin 12.3 12.0 - 15.0 g/dL   HCT 36.1 36.0 - 46.0 %   MCV 96.5 78.0 - 100.0 fL   MCH 32.9 26.0 - 34.0 pg   MCHC 34.1 30.0 - 36.0 g/dL   RDW 12.8 11.5 - 15.5 %   Platelets 713 (H) 150 - 400 K/uL   Neutrophils Relative % 76 %   Neutro Abs 8.2 (H) 1.7 - 7.7 K/uL   Lymphocytes Relative 16 %   Lymphs Abs 1.7 0.7 - 4.0 K/uL   Monocytes Relative 6 %   Monocytes Absolute 0.7 0.1 - 1.0 K/uL   Eosinophils Relative 2 %   Eosinophils Absolute 0.2 0.0 - 0.7 K/uL   Basophils Relative 0 %   Basophils Absolute 0.0 0.0 - 0.1 K/uL  Basic metabolic panel     Status: Abnormal   Collection Time: 01/02/17  3:23 AM  Result Value Ref Range   Sodium 143 135 - 145 mmol/L   Potassium 3.1 (L) 3.5 - 5.1 mmol/L   Chloride 107 101 - 111 mmol/L   CO2 23 22 - 32 mmol/L   Glucose, Bld 107 (H) 65 - 99 mg/dL   BUN 37 (H) 6 - 20 mg/dL   Creatinine, Ser 2.93 (H) 0.44 - 1.00 mg/dL   Calcium 9.4 8.9 -  10.3 mg/dL   GFR calc non Af Amer 17 (L) >60 mL/min   GFR calc Af Amer 19 (L) >60 mL/min    Comment: (NOTE) The eGFR has been calculated using the CKD EPI equation. This calculation has not been validated in all clinical situations. eGFR's persistently <60 mL/min signify possible Chronic Kidney Disease.    Anion gap 13 5 - 15  HIV antibody (Routine Testing)     Status: None   Collection Time: 01/02/17  9:48 AM  Result Value Ref Range   HIV Screen 4th Generation wRfx Non Reactive Non Reactive    Comment: (NOTE) Performed At: [facility] [place], [place] [id number] Lindon Romp MD [id number]   CBC     Status: Abnormal   Collection Time: 01/03/17  5:46 AM  Result Value Ref Range   WBC 12.3 (H) 4.0 - 10.5 K/uL   RBC 3.16 (L) 3.87 - 5.11 MIL/uL   Hemoglobin 10.4 (L) 12.0 - 15.0 g/dL   HCT 31.2 (L) 36.0 - 46.0 %   MCV 98.7 78.0 - 100.0 fL   MCH 32.9 26.0 - 34.0 pg   MCHC 33.3 30.0 - 36.0 g/dL   RDW 13.1 11.5 - 15.5 %    Platelets 593 (H) 150 - 400 K/uL  Comprehensive metabolic panel     Status: Abnormal   Collection Time: 01/03/17  5:46 AM  Result Value Ref Range   Sodium 145 135 - 145 mmol/L   Potassium 3.7 3.5 - 5.1 mmol/L   Chloride 114 (H) 101 - 111 mmol/L   CO2 23 22 - 32 mmol/L   Glucose, Bld 107 (H) 65 - 99 mg/dL   BUN 26 (H) 6 - 20 mg/dL   Creatinine, Ser 1.57 (H) 0.44 - 1.00 mg/dL    Comment: DELTA CHECK NOTED   Calcium 8.7 (L) 8.9 - 10.3 mg/dL   Total Protein 6.2 (L) 6.5 - 8.1 g/dL   Albumin 2.9 (L) 3.5 - 5.0 g/dL   AST 16 15 - 41 U/L   ALT 9 (L) 14 - 54 U/L   Alkaline Phosphatase 79 38 - 126 U/L   Total Bilirubin 0.1 (L) 0.3 - 1.2 mg/dL   GFR calc non Af Amer 35 (L) >60 mL/min   GFR calc Af Amer 41 (L) >60 mL/min    Comment: (NOTE) The eGFR has been calculated using the CKD EPI equation. This calculation has not been validated in all clinical situations. eGFR's persistently <60 mL/min signify possible Chronic Kidney Disease.    Anion gap 8 5 - 15    ABGS No results for input(s): PHART, PO2ART, TCO2, HCO3 in the last 72 hours.  Invalid input(s): PCO2 CULTURES No results found for this or any previous visit (from the past 240 hour(s)). Studies/Results: No results found.  Medications: I have reviewed the patient's current medications.  Assesment:  Active Problems:   HTN (hypertension)   Mental retardation   Right tonsillar squamous cell carcinoma (HCC)   Gastrostomy tube in place [facility])   Acute kidney injury ([place])   Protein-calorie malnutrition, severe   Palliative care encounter   Goals of care, counseling/discussion    Plan:  Medications reviewed Continue iv hydration Continue pain management Will monitor BMP  Palliative car consult appreciated    LOS: 1 day   Talasia Saulter 01/03/2017, 11:35 AM

## 2017-01-03 NOTE — Progress Notes (Signed)
Daily Progress Note   Patient Name: Molly Campbell       Date: 01/03/2017 DOB: [birth year]  Age: 56 y.o. MRN#: [id number] Attending Physician: Rosita Fire, MD Primary Care Physician: Rosita Fire, MD Admit Date: 01/02/2017  Reason for Consultation/Follow-up: Establishing goals of care, Hospice Evaluation and Psychosocial/spiritual support  Subjective: Molly Campbell is sitting up in bed. She greets me making and keeping eye contact as I enter. She is calm and cooperative today. We talk about her needs. She states that she continues to have pain. I share that [facility] will come later this morning and speak with her about how we can care for her at home.  Molly Campbell is accepting of in-home hospice services. We talk about what's equipment she may need. She states that she has a hospital bed at this time, but would like bedside suctioning if possible, and also possibly a showerhead to help with bathing and her home.  We talk about the benefits of in-home hospice service, but I also share that if/when she needs more help, she can be placed in a facility. Molly Campbell states that she will be able to let her Campbell know if that time comes. We talk about family support. I share that Molly Campbell and Molly Campbell are available to help if needed. Molly Campbell states that she does not want their input. She shares that they don't listen to her about what's going on with her body, and they talk over her.  Conference with Molly Campbell regarding needs.  Length of Stay: 1  Current Medications: Scheduled Meds:  . amLODipine  5 mg Oral Daily  . DULoxetine  60 mg Oral Daily  . enoxaparin (LOVENOX) injection  30 mg Subcutaneous Q24H  . feeding supplement (PRO-STAT SUGAR FREE 64)  30  mL Per Tube Daily  . free water  150 mL Per Tube Q6H  .  HYDROmorphone (DILAUDID) injection  1 mg Intravenous Once    Continuous Infusions: . sodium chloride 100 mL/hr at 01/03/17 0741  . feeding supplement (OSMOLITE 1.5 CAL) 1,000 mL (01/02/17 1137)    PRN Meds: HYDROmorphone (DILAUDID) injection, ondansetron **OR** ondansetron (ZOFRAN) IV  Physical Exam  Constitutional: No distress.  Frail, acutely/chronically ill appearing. Makes and keeps eye contact.  Cardiovascular: Normal rate.   Pulmonary/Chest: Effort normal. No respiratory  distress.  Abdominal: Soft.  PEG tube site  Neurological: She is alert.  Skin: Skin is warm and dry.  Nursing note and vitals reviewed.           Vital Signs: BP 126/74 (BP Location: Left Arm)   Pulse 76   Temp 98.4 F (36.9 C) (Oral)   Resp 20   Ht 5\' 2"  (1.575 m)   Wt 37.6 kg (83 lb)   SpO2 99%   BMI 15.18 kg/m  SpO2: SpO2: 99 % O2 Device: O2 Device: Not Delivered O2 Flow Rate:    Intake/output summary:  Intake/Output Summary (Last 24 hours) at 01/03/17 1150 Last data filed at 01/03/17 0830  Gross per 24 hour  Intake          2878.66 ml  Output                0 ml  Net          2878.66 ml   LBM: Last BM Date:  (Unknown) Baseline Weight: Weight: 37.6 kg (83 lb) Most recent weight: Weight: 37.6 kg (83 lb)       Palliative Assessment/Data:    Flowsheet Rows     Most Recent Value  Intake Tab  Referral Department  Hospitalist  Unit at Time of Referral  Med/Surg Unit  Palliative Care Primary Diagnosis  Cancer  Date Notified  01/02/17  Palliative Care Type  New Palliative care  Reason for referral  Clarify Goals of Care, End of Life Care Assistance  Date of Admission  01/02/17  Date first seen by Palliative Care  01/02/17  # of days Palliative referral response time  0 Day(s)  # of days IP prior to Palliative referral  0  Clinical Assessment  Palliative Performance Scale Score  30%  Pain Max last 24 hours  Not able to  report  Pain Min Last 24 hours  Not able to report  Dyspnea Max Last 24 Hours  Not able to report  Dyspnea Min Last 24 hours  Not able to report  Psychosocial & Spiritual Assessment  Palliative Care Outcomes  Patient/Family meeting held?  Yes  Who was at the meeting?  sister Molly Campbell, daughter [name] via phone       Patient Active Problem List   Diagnosis Date Noted  . Acute kidney injury ([place]) 01/02/2017  . Protein-calorie malnutrition, severe 01/02/2017  . Palliative care encounter   . Goals of care, counseling/discussion   . Pulmonary emphysema ([place]) 12/15/2015  . Gastrostomy tube in place ([place]) 12/15/2015  . Alcohol use 12/15/2015  . Tobacco use 12/15/2015  . 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 Chronic periodontitis 12/13/2015  . Tonsillar cancer ([place])   . Gastric mass 11/22/2015  . Constipation 11/22/2015  . Malignant neoplasm of tonsillar fossa ([place]) 11/12/2015  . 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 Melena 12/02/2013  . Rectal pain 12/02/2013  . Abdominal pain, epigastric 12/02/2013  . GERD (gastroesophageal reflux disease) 12/02/2013  . Esophageal dysphagia 12/02/2013  . History of cervical cancer 09/26/2013  . Mental retardation 01/07/2013  . Encephalopathy, metabolic 60/73/7106  . Chest pain 01/06/2013  . Atrial fibrillation ([place]) 01/05/2013  . Alcoholic hepatitis 26/94/8546  . HTN (hypertension) 03/14/2011  . Chest pain 03/14/2011  . Alcohol intoxication ([place]) 03/14/2011  . Tobacco abuse   . Alcohol abuse   . Drug abuse, cocaine type     Palliative Care Assessment & Plan   Patient Profile: 56  y.o. female  with past medical history of stage 4 head/neck cancer, hx of cervical cnacer.  admitted on 01/02/2017 with Acute kidney injury related to dehydration..   Assessment: Stage IV head and neck cancer; [name] is sent not seeking further treatment for her cancer. She does have a PEG tube site, and  is agreeable to tube feeding and free water. She is accepting of [facility] in home services, with transition to facility when/if needed.  Recommendations/Plan: [facility] in home services, with transition to facility when/if needed.  Goals of Care and Additional Recommendations:  Limitations on Scope of Treatment: Hospice services in home, continue code status discussions  Code Status:    Code Status Orders        Start     Ordered   01/02/17 0828  Full code  Continuous     01/02/17 0827    Code Status History    Date Active Date Inactive Code Status Order ID Comments User Context   01/05/2013  9:42 PM 01/07/2013  7:51 PM Full Code [id number]  Oswald Hillock, MD Inpatient   03/14/2011  9:43 PM 03/16/2011  6:20 PM Full Code [id number]  Dietrich Pates, RN Inpatient       Prognosis:   < 3 months would not be surprising based on stage IV head neck cancer, functional decline, weight loss to 83 pounds.  Discharge Planning:  Home with the benefits of [facility] hospice for comfort and dignity at end-of-life.  Care plan was discussed with nursing staff, case manager, social worker, and Dr. Legrand Rams on next rounds.  Thank you for allowing the Palliative Medicine Team to assist in the care of this patient.   Time In: 0915 Time Out: 0940 Total Time 25 minutes Prolonged Time Billed  no       Greater than 50%  of this time was spent counseling and coordinating care related to the above assessment and plan.  Drue Novel, NP  Please contact Palliative Medicine Team phone at [phone] for questions and concerns.

## 2017-01-03 NOTE — Progress Notes (Addendum)
Patient insisted on leaving, explained leaving against medical advice. Patient is alert and oriented x4, states she is leaving. Grandson Shiheel Maricela Bo is present and on the phone with the patient's daughter explaining the situation. Patient signed AMA paperwork and left with family. Dr. Josephine Cables office aware of patient leaving per NP [facility].

## 2017-01-03 NOTE — Care Management Note (Signed)
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 PEG present PTA, patient has not been using PEG. Palliative and nutrition consulted. Patient has elected to go home with [facility]. Patient reports no needs from CM. States she can get a ride home when [facility]. Wants to go today. Sister at bedside originally but patient made her leave prior to discussions.  Patient cursing and kicking EVS personnel out of room.          Action/Plan: Patient will discharge home with [facility]. [place] of [facility] aware of admission and following. CM provided patient information to [facility] Rep.   Expected Discharge Date:       01/04/2017           Expected Discharge Plan:  Home w Hospice Care  In-House Referral:  Hospice / [place], Nutrition  Discharge planning Services  CM Consult  Post Acute Care Choice:  Hospice Choice offered to:  Patient  DME Arranged:    DME Agency:     HH Arranged:    [place] Agency:     Status of Service:  Completed, signed off  If discussed at [employer], dates discussed:    Additional Comments:  Cavon Nicolls, Chauncey [place], RN 01/03/2017, 11:32 AM

## 2017-01-03 NOTE — Care Management (Signed)
CM called Dr. Josephine Cables office to make sure he is aware of patient admission. Staff to alert Dr. Legrand Rams.

## 2017-01-08 NOTE — Discharge Summary (Signed)
Physician Discharge Summary  Patient ID: Molly Campbell MRN: [id number] DOB/AGE: 58-Sep-1962 58 y.o. Primary Care Physician:Duard Spiewak, MD Admit date: 01/02/2017 Discharge date: 01/08/2017    Discharge Diagnoses:    Active Problems:   HTN (hypertension)   Mental retardation   Right tonsillar squamous cell carcinoma (HCC)   Gastrostomy tube in place [facility])   Acute kidney injury ([place])   Protein-calorie malnutrition, severe   Palliative care encounter   Goals of care, counseling/discussion   Encounter for hospice care discussion   Allergies as of 01/03/2017      Reactions   Benicar [olmesartan] Palpitations, Other (See Comments)   TACHYCARDIA She was told to never take it again   Ibuprofen Palpitations   TACHYCARDIA   Latex Dermatitis   Latex bandages - little sores    Morphine And Related Nausea And Vomiting      Medication List    ASK your doctor about these medications   albuterol 108 (90 Base) MCG/ACT inhaler Commonly known as:  PROVENTIL HFA;VENTOLIN HFA Inhale 1-2 puffs into the lungs every 6 (six) hours as needed for wheezing or shortness of breath.   HYDROcodone-acetaminophen 5-325 MG tablet Commonly known as:  NORCO/VICODIN Take 1 tablet by mouth every 4 (four) hours as needed for moderate pain or severe pain.   nitroGLYCERIN 0.4 MG SL tablet Commonly known as:  NITROSTAT Place 1 tablet (0.4 mg total) under the tongue every 5 (five) minutes as needed for chest pain.       Discharged Condition: DISCHARGED ama    Consults: pALLIATIVE CARE  Significant Diagnostic Studies: Ct Soft Tissue Neck W Contrast  Result Date: 12/27/2016 CLINICAL DATA:  Neck mass. Increasing difficulty with swallowing. History of right tonsillar squamous cell carcinoma. EXAM: CT NECK WITH CONTRAST TECHNIQUE: Multidetector CT imaging of the neck was performed using the standard protocol following the bolus administration of intravenous contrast. CONTRAST:  41mL ISOVUE-300  IOPAMIDOL (ISOVUE-300) INJECTION 61% COMPARISON:  12/03/2016 FINDINGS: Pharynx and larynx: Bulky recurrent tumor in the right oropharynx has moderately progressed from the recent prior examination. This demonstrates heterogeneous enhancement and areas of ulceration. Tumor now extends to the posterolateral aspect of the right oral tongue. There is involvement of the tongue base, uvula, and nasopharynx. Milder pharyngeal involvement is again seen on the left. There is progressive tumor involvement of the right parapharyngeal space. The airway remains patent. Salivary glands: No inflammation, mass, or stone. Thyroid: Unremarkable. Lymph nodes: Necrotic right level IIa lymphadenopathy has progressed, with index noted measuring 2.5 x 2.2 cm (previously 2.3 x 1.2 cm). Lymphadenopathy is partially inseparable from right lateral pharyngeal/parapharyngeal tumor. Small level IIa lymph nodes measure up to 9 mm in short axis, unchanged. Vascular: Major vascular structures of the neck appear patent. Mild-to-moderate calcified atherosclerosis is noted at the carotid bifurcations without evidence of significant stenosis. Right jugular Port-A-Cath is partially visualized. Limited intracranial: Unremarkable. Visualized orbits: Unremarkable. Mastoids and visualized paranasal sinuses: Visualized paranasal sinuses are clear. A small left mastoid effusion is unchanged. Skeleton: No suspicious osseous lesion. Upper chest: Advanced centrilobular emphysema. Partially visualized 5 mm posterior right upper lobe lung nodule is grossly unchanged (series 5, image 107). There is also a 2 mm posterior right upper lobe nodule (series 5, image 102) which may have been obscured by motion and atelectasis on the prior study. Other: None. IMPRESSION: 1. Interval progression of bulky, spreading tumor predominantly in the right oropharynx. 2. Progressive right level II lymphadenopathy. Electronically Signed   By: Seymour Bars.D.  On: 12/27/2016 13:52     Lab Results: Basic Metabolic Panel: No results for input(s): NA, K, CL, CO2, GLUCOSE, BUN, CREATININE, CALCIUM, MG, PHOS in the last 72 hours. Liver Function Tests: No results for input(s): AST, ALT, ALKPHOS, BILITOT, PROT, ALBUMIN in the last 72 hours.   CBC: No results for input(s): WBC, NEUTROABS, HGB, HCT, MCV, PLT in the last 72 hours.  No results found for this or any previous visit (from the past 240 hour(s)).   Hospital Course:   This is a 56 years old female with history of stage 4 ca of rhe head neck was admitted due to acute kidney injury. Patient was started on iv hydration and pain management, however, patient singed AMA and left the hospital.  Discharge Exam: Blood pressure 126/74, pulse 76, temperature 98.4 F (36.9 C), temperature source Oral, resp. rate 20, height 5\' 2"  (1.575 m), weight 37.6 kg (83 lb), SpO2 99 %.    Disposition:  Signed AMA      Signed: Richmond Coldren   01/08/2017, 9:58 AM

## 2017-03-08 DEATH — deceased

## 2017-04-11 ENCOUNTER — Other Ambulatory Visit: Payer: Self-pay | Admitting: Nurse Practitioner

## 2017-11-03 IMAGING — CT CT BIOPSY
1 of 4 series · 14 of 32 positions shown, 19 images · non-contrast
Comparison: none

CLINICAL DATA: History of tonsillar carcinoma. Hypermetabolic mass
anterior to the proximal stomach requiring biopsy.

[Series 2: i-spiral 5.0 b40f · axial · 0.64mm/px · z∈[-553,-322]mm · 14 of 76 slices shown, 19 images]
[im 5/76  soft-tissue]
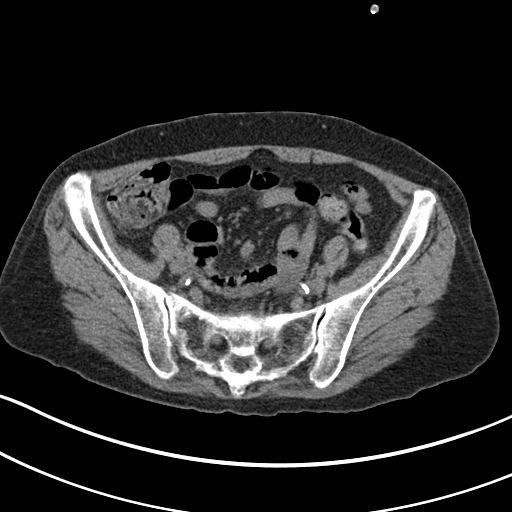
[im 5/76  bone]
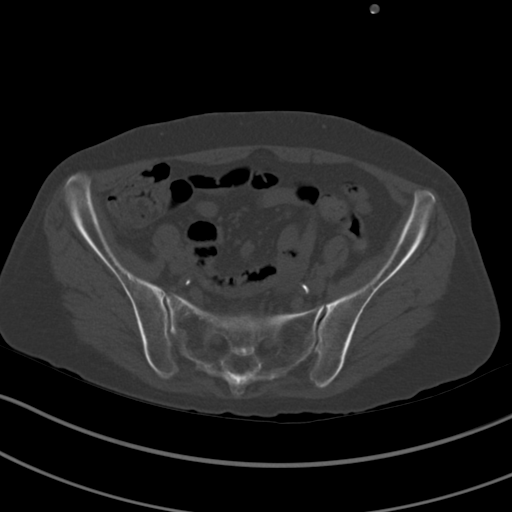
[im 10/76  soft-tissue]
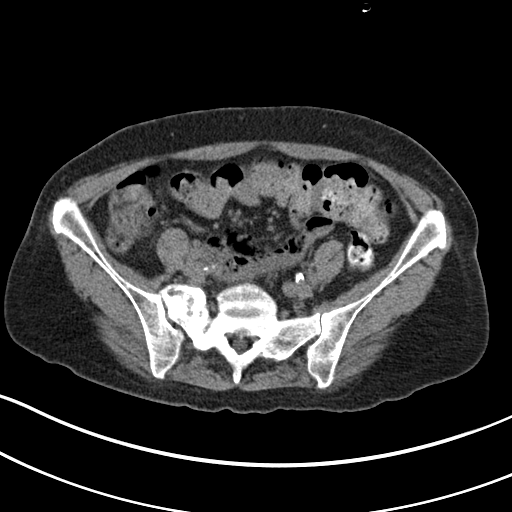
[im 15/76  soft-tissue]
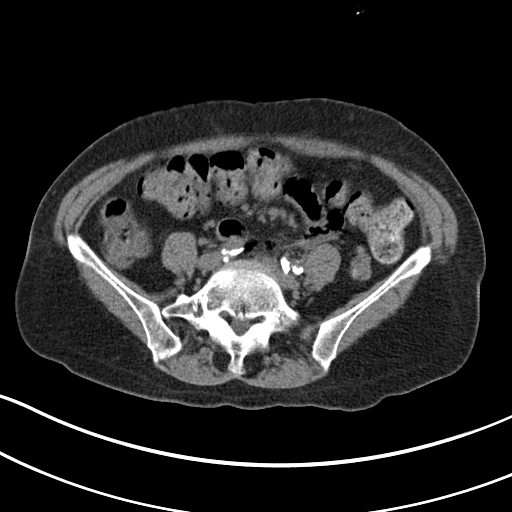
[im 24/76  soft-tissue]
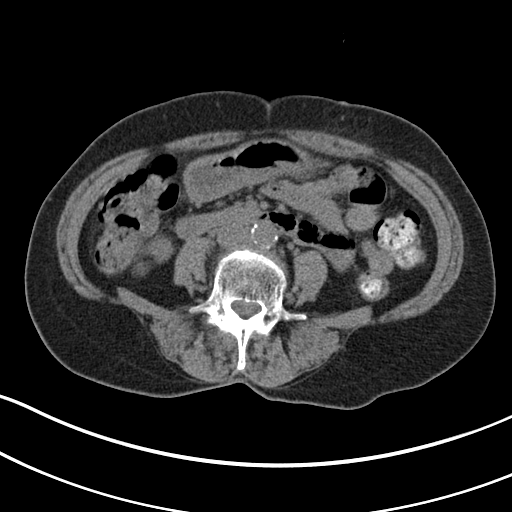
[im 29/76  soft-tissue]
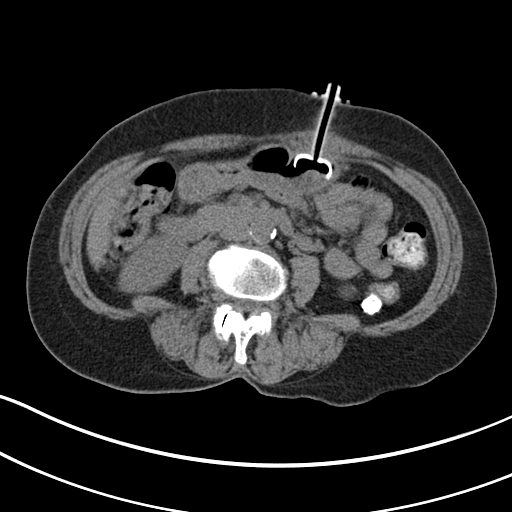
[im 33/76  soft-tissue]
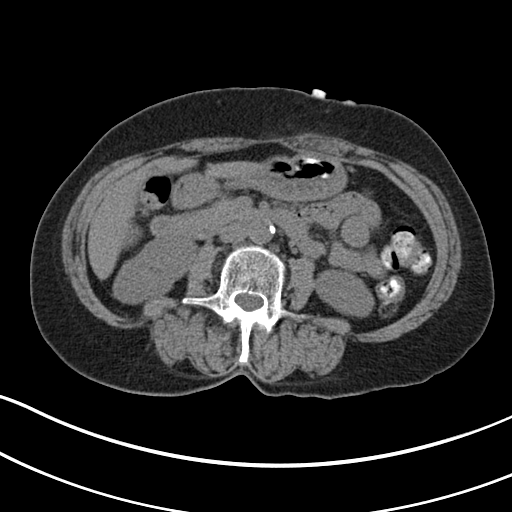
[im 38/76  soft-tissue]
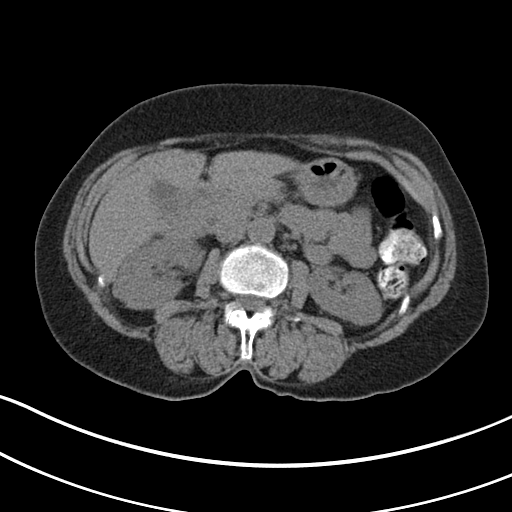
[im 43/76  soft-tissue]
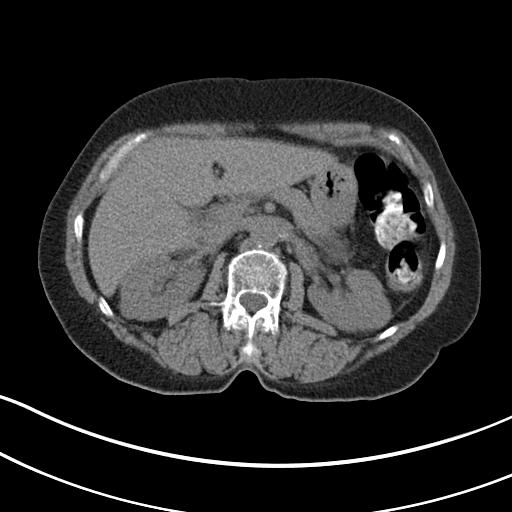
[im 47/76  soft-tissue]
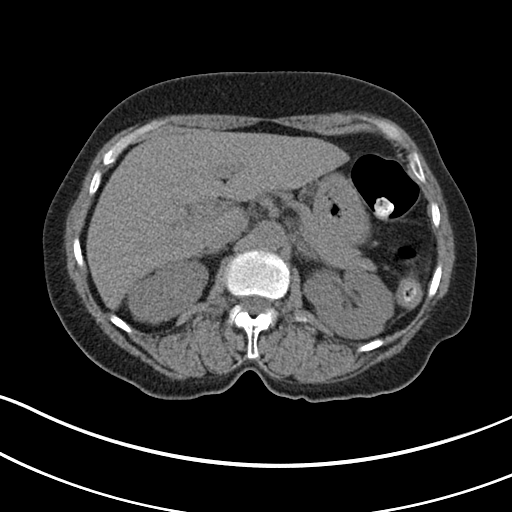
[im 47/76  bone]
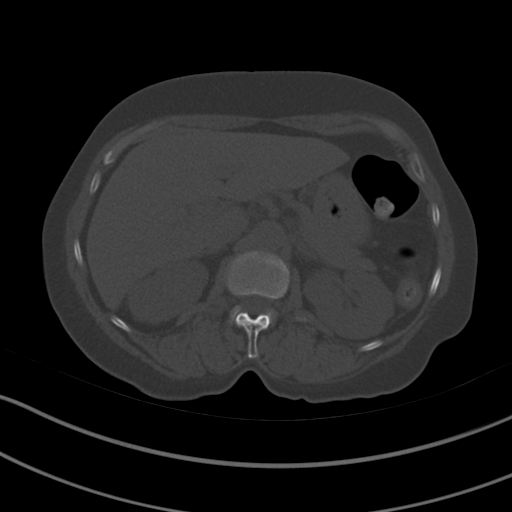
[im 52/76  soft-tissue]
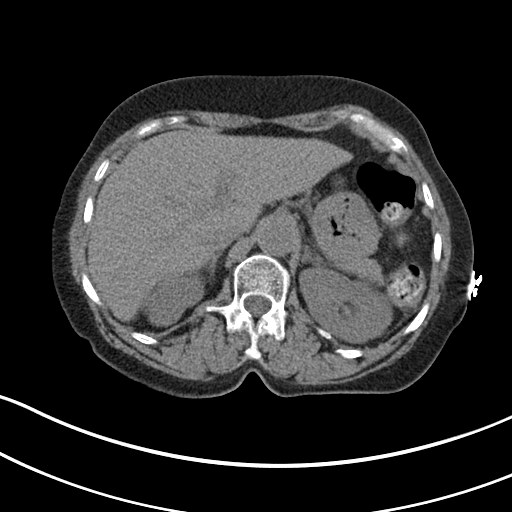
[im 57/76  lung]
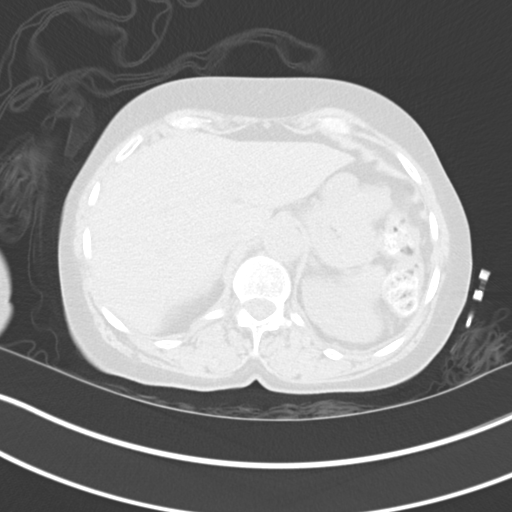
[im 61/76  soft-tissue]
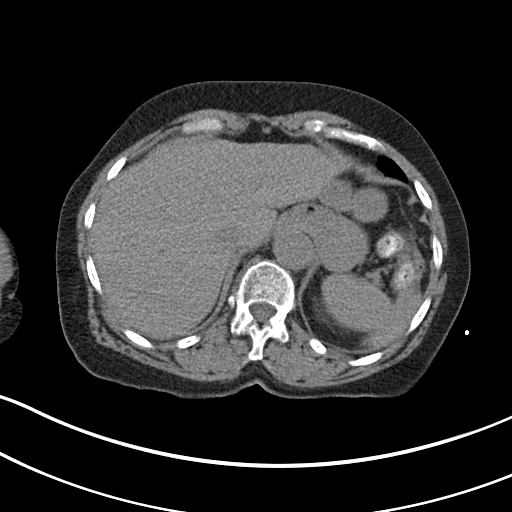
[im 61/76  lung]
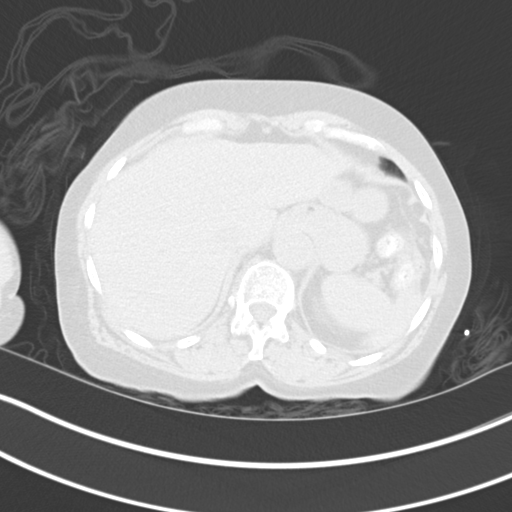
[im 66/76  soft-tissue]
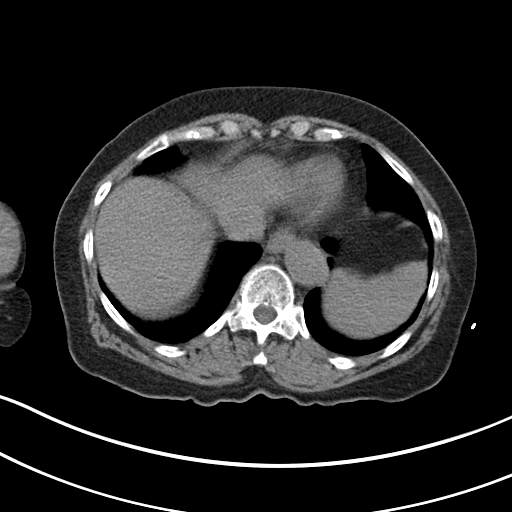
[im 66/76  lung]
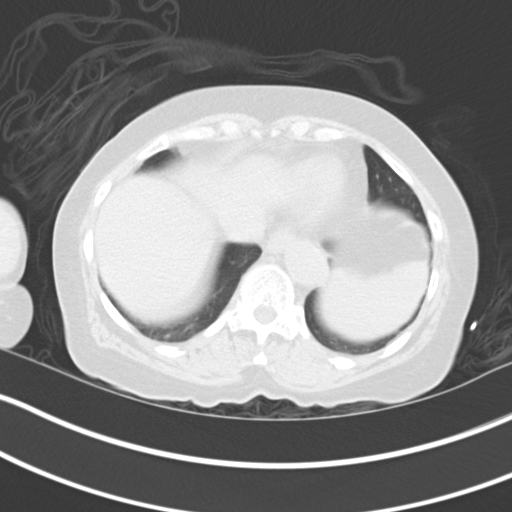
[im 71/76  soft-tissue]
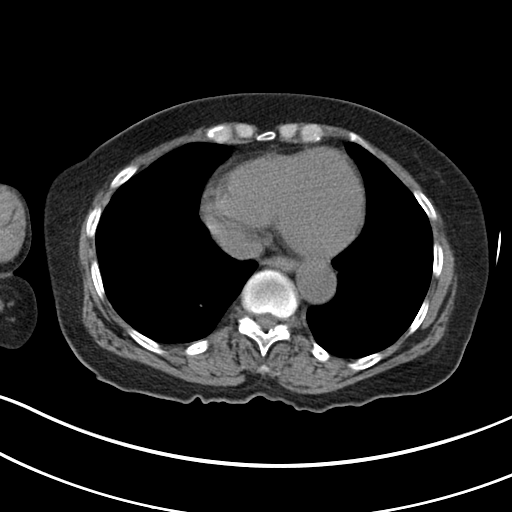
[im 71/76  lung]
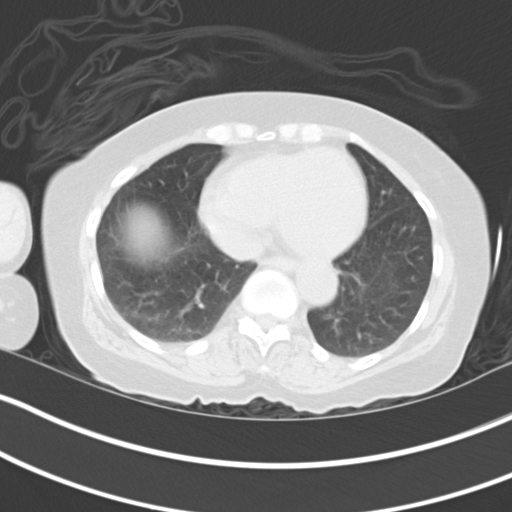

[14 of 32 positions shown; findings below may reference images not displayed]

EXAM:
CT GUIDED NEEDLE ASPIRATE BIOPSY OF BODY PART

ANESTHESIA/SEDATION:
2.0  Mg IV Versed; 100 mcg IV Fentanyl

Total Moderate Sedation Time: 20 minutes.

The patient's level of consciousness and physiologic status were
continuously monitored during the procedure by Radiology nursing.

PROCEDURE:
The procedure risks, benefits, and alternatives were explained to
the patient. Questions regarding the procedure were encouraged and
answered. The patient understands and consents to the procedure. A
time-out was performed prior to the procedure.

The anterior upper left abdominal wall was prepped with
chlorhexidine in a sterile fashion, and a sterile drape was applied
covering the operative field. A sterile gown and sterile gloves were
used for the procedure. Local anesthesia was provided with 1%
Lidocaine.

CT was performed through the upper abdomen in a supine position.
After localizing the anterior gastric mass, a 17 gauge trocar needle
was advanced under CT guidance. After confirming needle position, a
total of 3 separate 18 gauge core biopsy samples were obtained.
These were submitted in formalin. Post biopsy CT was performed.

COMPLICATIONS:
None
FINDINGS: Bilobed shaped mass abutting the proximal stomach anteriorly and
centered anterior to the gastric lumen measures approximately 2.6 x
4.6 cm in greatest transverse dimensions. Solid tissue was obtained.
IMPRESSION: CT-guided core biopsy performed of the bilobed shaped soft tissue
mass anterior to the proximal stomach.
